# Patient Record
Sex: Female | Born: 1960 | Race: White | Hispanic: No | Marital: Married | Smoking: Former smoker
Health system: Southern US, Community
[De-identification: ages and names within clinical notes are randomized; demographics above are authoritative.]

## PROBLEM LIST (undated history)

## (undated) DIAGNOSIS — J189 Pneumonia, unspecified organism: Secondary | ICD-10-CM

## (undated) DIAGNOSIS — Z973 Presence of spectacles and contact lenses: Secondary | ICD-10-CM

## (undated) DIAGNOSIS — M5137 Other intervertebral disc degeneration, lumbosacral region: Secondary | ICD-10-CM

## (undated) DIAGNOSIS — I341 Nonrheumatic mitral (valve) prolapse: Secondary | ICD-10-CM

## (undated) DIAGNOSIS — N950 Postmenopausal bleeding: Secondary | ICD-10-CM

## (undated) DIAGNOSIS — C801 Malignant (primary) neoplasm, unspecified: Secondary | ICD-10-CM

## (undated) DIAGNOSIS — Z8679 Personal history of other diseases of the circulatory system: Secondary | ICD-10-CM

## (undated) DIAGNOSIS — Z87412 Personal history of vulvar dysplasia: Secondary | ICD-10-CM

## (undated) DIAGNOSIS — M419 Scoliosis, unspecified: Secondary | ICD-10-CM

## (undated) DIAGNOSIS — M1990 Unspecified osteoarthritis, unspecified site: Secondary | ICD-10-CM

## (undated) DIAGNOSIS — Z8739 Personal history of other diseases of the musculoskeletal system and connective tissue: Secondary | ICD-10-CM

## (undated) DIAGNOSIS — K219 Gastro-esophageal reflux disease without esophagitis: Secondary | ICD-10-CM

## (undated) DIAGNOSIS — M47816 Spondylosis without myelopathy or radiculopathy, lumbar region: Secondary | ICD-10-CM

## (undated) DIAGNOSIS — M5136 Other intervertebral disc degeneration, lumbar region: Secondary | ICD-10-CM

## (undated) HISTORY — DX: Gastro-esophageal reflux disease without esophagitis: K21.9

## (undated) HISTORY — PX: UPPER GASTROINTESTINAL ENDOSCOPY: SHX188

## (undated) HISTORY — PX: CARDIAC CATHETERIZATION: SHX172

## (undated) HISTORY — PX: TOTAL HIP ARTHROPLASTY: SHX124

## (undated) HISTORY — PX: COLONOSCOPY W/ POLYPECTOMY: SHX1380

## (undated) HISTORY — PX: VSD REPAIR: SHX276

## (undated) HISTORY — DX: Nonrheumatic mitral (valve) prolapse: I34.1

## (undated) HISTORY — PX: ANTERIOR CERVICAL DECOMP/DISCECTOMY FUSION: SHX1161

---
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
## 1981-09-07 HISTORY — PX: LEG SURGERY: SHX1003

## 2003-12-18 ENCOUNTER — Ambulatory Visit (HOSPITAL_COMMUNITY): Admission: RE | Admit: 2003-12-18 | Discharge: 2003-12-18 | Payer: Self-pay | Admitting: Internal Medicine

## 2004-01-15 ENCOUNTER — Encounter (HOSPITAL_COMMUNITY): Admission: RE | Admit: 2004-01-15 | Discharge: 2004-02-14 | Payer: Self-pay | Admitting: Orthopedic Surgery

## 2004-02-19 ENCOUNTER — Encounter (HOSPITAL_COMMUNITY): Admission: RE | Admit: 2004-02-19 | Discharge: 2004-03-20 | Payer: Self-pay | Admitting: Orthopedic Surgery

## 2005-03-24 ENCOUNTER — Ambulatory Visit (HOSPITAL_COMMUNITY): Admission: RE | Admit: 2005-03-24 | Discharge: 2005-03-24 | Payer: Self-pay | Admitting: Family Medicine

## 2005-04-20 ENCOUNTER — Ambulatory Visit (HOSPITAL_COMMUNITY): Admission: RE | Admit: 2005-04-20 | Discharge: 2005-04-20 | Payer: Self-pay | Admitting: *Deleted

## 2005-05-07 ENCOUNTER — Other Ambulatory Visit: Admission: RE | Admit: 2005-05-07 | Discharge: 2005-05-07 | Payer: Self-pay | Admitting: *Deleted

## 2005-12-21 ENCOUNTER — Encounter (HOSPITAL_COMMUNITY): Admission: RE | Admit: 2005-12-21 | Discharge: 2006-01-20 | Payer: Self-pay | Admitting: Internal Medicine

## 2006-01-19 ENCOUNTER — Ambulatory Visit: Payer: Self-pay | Admitting: Internal Medicine

## 2006-01-20 ENCOUNTER — Ambulatory Visit: Payer: Self-pay | Admitting: Internal Medicine

## 2006-01-20 ENCOUNTER — Encounter (INDEPENDENT_AMBULATORY_CARE_PROVIDER_SITE_OTHER): Payer: Self-pay | Admitting: Specialist

## 2006-01-21 ENCOUNTER — Ambulatory Visit: Payer: Self-pay | Admitting: Cardiology

## 2006-02-05 ENCOUNTER — Ambulatory Visit: Payer: Self-pay | Admitting: Internal Medicine

## 2006-04-07 HISTORY — PX: LAPAROSCOPIC CHOLECYSTECTOMY: SUR755

## 2006-04-19 ENCOUNTER — Ambulatory Visit (HOSPITAL_COMMUNITY): Admission: RE | Admit: 2006-04-19 | Discharge: 2006-04-20 | Payer: Self-pay | Admitting: Surgery

## 2006-04-19 ENCOUNTER — Encounter (INDEPENDENT_AMBULATORY_CARE_PROVIDER_SITE_OTHER): Payer: Self-pay | Admitting: Specialist

## 2007-09-08 HISTORY — PX: CHOLECYSTECTOMY: SHX55

## 2008-06-28 ENCOUNTER — Emergency Department (HOSPITAL_COMMUNITY): Admission: EM | Admit: 2008-06-28 | Discharge: 2008-06-28 | Payer: Self-pay | Admitting: Emergency Medicine

## 2008-07-26 ENCOUNTER — Ambulatory Visit (HOSPITAL_COMMUNITY): Admission: RE | Admit: 2008-07-26 | Discharge: 2008-07-26 | Payer: Self-pay | Admitting: Internal Medicine

## 2008-11-15 ENCOUNTER — Encounter: Admission: RE | Admit: 2008-11-15 | Discharge: 2008-11-15 | Payer: Self-pay | Admitting: Obstetrics and Gynecology

## 2009-09-07 HISTORY — PX: TOTAL HIP ARTHROPLASTY: SHX124

## 2009-09-07 HISTORY — PX: NECK SURGERY: SHX720

## 2009-10-31 ENCOUNTER — Ambulatory Visit (HOSPITAL_COMMUNITY): Admission: RE | Admit: 2009-10-31 | Discharge: 2009-10-31 | Payer: Self-pay | Admitting: Internal Medicine

## 2009-11-01 ENCOUNTER — Ambulatory Visit (HOSPITAL_COMMUNITY): Admission: RE | Admit: 2009-11-01 | Discharge: 2009-11-01 | Payer: Self-pay | Admitting: Internal Medicine

## 2009-11-21 ENCOUNTER — Ambulatory Visit (HOSPITAL_COMMUNITY): Admission: RE | Admit: 2009-11-21 | Discharge: 2009-11-21 | Payer: Self-pay | Admitting: Neurosurgery

## 2009-11-25 ENCOUNTER — Ambulatory Visit (HOSPITAL_COMMUNITY): Admission: RE | Admit: 2009-11-25 | Discharge: 2009-11-26 | Payer: Self-pay | Admitting: Neurosurgery

## 2009-12-19 ENCOUNTER — Encounter: Admission: RE | Admit: 2009-12-19 | Discharge: 2009-12-19 | Payer: Self-pay | Admitting: Neurosurgery

## 2010-01-23 ENCOUNTER — Emergency Department (HOSPITAL_COMMUNITY): Admission: EM | Admit: 2010-01-23 | Discharge: 2010-01-23 | Payer: Self-pay | Admitting: Emergency Medicine

## 2010-04-01 ENCOUNTER — Encounter: Admission: RE | Admit: 2010-04-01 | Discharge: 2010-04-01 | Payer: Self-pay | Admitting: Neurosurgery

## 2010-04-18 ENCOUNTER — Ambulatory Visit: Payer: Self-pay | Admitting: Vascular Surgery

## 2010-05-15 ENCOUNTER — Encounter: Admission: RE | Admit: 2010-05-15 | Discharge: 2010-05-15 | Payer: Self-pay | Admitting: Neurosurgery

## 2010-05-23 ENCOUNTER — Encounter: Admission: RE | Admit: 2010-05-23 | Discharge: 2010-05-23 | Payer: Self-pay | Admitting: Neurosurgery

## 2010-06-16 ENCOUNTER — Inpatient Hospital Stay (HOSPITAL_COMMUNITY): Admission: RE | Admit: 2010-06-16 | Discharge: 2010-06-19 | Payer: Self-pay | Admitting: Orthopedic Surgery

## 2010-09-28 ENCOUNTER — Encounter: Payer: Self-pay | Admitting: Neurosurgery

## 2010-11-20 LAB — CBC
HCT: 27.3 % — ABNORMAL LOW (ref 36.0–46.0)
Hemoglobin: 9.6 g/dL — ABNORMAL LOW (ref 12.0–15.0)
MCHC: 35.3 g/dL (ref 30.0–36.0)
MCHC: 35.4 g/dL (ref 30.0–36.0)
Platelets: 298 10*3/uL (ref 150–400)
RBC: 3.04 MIL/uL — ABNORMAL LOW (ref 3.87–5.11)
RDW: 13.3 % (ref 11.5–15.5)
WBC: 8.1 10*3/uL (ref 4.0–10.5)
WBC: 8.5 10*3/uL (ref 4.0–10.5)

## 2010-11-20 LAB — URINALYSIS, ROUTINE W REFLEX MICROSCOPIC
Glucose, UA: NEGATIVE mg/dL
Hgb urine dipstick: NEGATIVE
Ketones, ur: NEGATIVE mg/dL
Protein, ur: NEGATIVE mg/dL
Urobilinogen, UA: 0.2 mg/dL (ref 0.0–1.0)

## 2010-11-20 LAB — BASIC METABOLIC PANEL
BUN: 7 mg/dL (ref 6–23)
Calcium: 8.1 mg/dL — ABNORMAL LOW (ref 8.4–10.5)
Calcium: 8.3 mg/dL — ABNORMAL LOW (ref 8.4–10.5)
Creatinine, Ser: 0.59 mg/dL (ref 0.4–1.2)
GFR calc Af Amer: >60 mL/min (ref >60)
GFR calc non Af Amer: >60 mL/min (ref >60)
GFR calc non Af Amer: >60 mL/min (ref >60)
Glucose, Bld: 131 mg/dL — ABNORMAL HIGH (ref 70–99)
Glucose, Bld: 135 mg/dL — ABNORMAL HIGH (ref 70–99)
Potassium: 3.8 mEq/L (ref 3.5–5.1)
Potassium: 3.8 mEq/L (ref 3.5–5.1)
Sodium: 136 mEq/L (ref 135–145)

## 2010-11-20 LAB — TYPE AND SCREEN: Antibody Screen: NEGATIVE

## 2010-11-20 LAB — ABO/RH: ABO/RH(D): AB POS

## 2010-11-20 LAB — SURGICAL PCR SCREEN
MRSA, PCR: NEGATIVE
Staphylococcus aureus: NEGATIVE

## 2010-11-20 LAB — PROTIME-INR: Prothrombin Time: 13 seconds (ref 11.6–15.2)

## 2010-11-30 LAB — SURGICAL PCR SCREEN: MRSA, PCR: NEGATIVE

## 2010-11-30 LAB — BASIC METABOLIC PANEL
CO2: 29 mEq/L (ref 19–32)
Calcium: 9.7 mg/dL (ref 8.4–10.5)
GFR calc Af Amer: >60 mL/min (ref >60)
Potassium: 4.3 mEq/L (ref 3.5–5.1)
Sodium: 139 mEq/L (ref 135–145)

## 2010-11-30 LAB — CBC
HCT: 39.6 % (ref 36.0–46.0)
Hemoglobin: 13.9 g/dL (ref 12.0–15.0)
MCHC: 35 g/dL (ref 30.0–36.0)
RBC: 4.46 MIL/uL (ref 3.87–5.11)

## 2010-12-05 ENCOUNTER — Other Ambulatory Visit (HOSPITAL_COMMUNITY): Payer: Self-pay | Admitting: Orthopedic Surgery

## 2010-12-05 DIAGNOSIS — M7989 Other specified soft tissue disorders: Secondary | ICD-10-CM

## 2010-12-05 DIAGNOSIS — M79604 Pain in right leg: Secondary | ICD-10-CM

## 2010-12-05 DIAGNOSIS — M79605 Pain in left leg: Secondary | ICD-10-CM

## 2010-12-08 ENCOUNTER — Other Ambulatory Visit (HOSPITAL_COMMUNITY): Payer: Self-pay

## 2010-12-17 ENCOUNTER — Other Ambulatory Visit: Payer: Self-pay | Admitting: Obstetrics and Gynecology

## 2011-01-20 NOTE — Consult Note (Signed)
NEW PATIENT CONSULTATION   [name], [name]  DOB:  Oct 11, 1960                                       04/18/2010  [id number]#:[id number]   The patient presents today for evaluation of right leg pain and with a  history of prior right femoral artery repair.  She is a very pleasant,  50 year old white female with a prior history of right femoral artery  repair after a cath lab injury in 1983.  She had a history of what  sounds like congenital heart disease and had a right groin injury from a  cardiac cath in 1983.  She had repair of this by Dr. Hendricks [name];  apparently had a Dacron patch or Dacron replacement.  However, over the  past several months, she has been having pain in her right leg.  Initially, she was concerned that this may be on an ischemic standpoint.  But, with further evaluation, this has been determined to be related to  degenerative disk disease in her back.  She reports that the pain can  occasionally occur with sitting but is more profound with lying flat and  is positional.  She does report some pain extending down into her leg  but this is not a exercise-induced, claudication-type pain.  She has no  history of tissue loss in her lower extremities.   PRIOR SURGERIES:  She has had prior spine, lumbar, and cervical disk.  She has had a cholecystectomy in the past.   SOCIAL HISTORY:  She is married.  She works in Chief Financial Officer.  She does not  smoke having quit in 2004.  Does not drink alcohol on a regular basis.   FAMILY HISTORY:  Negative for atherosclerotic disease.   REVIEW OF SYSTEMS:  Positive for weight gain up to 217 pounds.  She is 5  feet 2 inches tall.  CARDIAC:  Positive for palpitations.  GI:  Reflux.  NEUROLOGIC:  Headaches.  PULMONARY:  Negative.  UROLOGIC:  Negative.  ENT:  Negative.  MUSCULOSKELETAL:  Positive for arthritis, joint pain, muscle pain.  PSYCHIATRIC:  Negative.  SKIN:  Negative.   PHYSICAL EXAM:  Well-developed white  female appearing stated age.  Blood  pressure is 112/76, pulse 82, respirations 18.  HEENT is normal.  Her  chest is clear bilaterally.  Heart:  Regular rate and rhythm.  Abdomen:  Moderate obesity and nontender.  Musculoskeletal shows no major  deformity or cyanosis.  Neurologic:  No focal weakness or paresthesias.  Skin without ulcers or rashes.  She does have palpable femoral and 1 to  2+ dorsalis pedis pulses bilaterally.  Her right groin incision is well  healed.   She does not have any evidence of false aneurysm or other pathology in  her groin.  She did undergo noninvasive vascular laboratories in our  office which reveal normal ankle-arm indices bilaterally at 0.99 on the  right and a greater than 1.0 on the left.  I discussed this at length  with the patient.  I do not feel that her leg pain is related to  arterial insufficiency.  I have recommended that she continue her plans  regarding her orthopedic spine concerns since it does not appear to be  related to arterial insufficiency.  She will follow up with [place] again on  an as-needed basis.     Larina Earthly,  M.D.  Electronically Signed   TFE/MEDQ  D:  04/18/2010  T:  04/21/2010  Job:  [id number]   cc:   Dr. Darnell [name], [place], [place]  [name], M.D.  Madelin Rear. Rachel Gambler, MD

## 2011-01-23 NOTE — Op Note (Signed)
[name], ABEND NO.:  [id number]   MEDICAL RECORD NO.:  [id number]          PATIENT TYPE:  AMB   LOCATION:  SDS                          FACILITY:  [facility]   PHYSICIAN:  Velora Heckler, MD      DATE OF BIRTH:  [birth year]   DATE OF PROCEDURE:  04/19/2006  DATE OF DISCHARGE:                                 OPERATIVE REPORT   PREOPERATIVE DIAGNOSIS:  Biliary dyskinesia, abdominal pain.   POSTOPERATIVE DIAGNOSIS:  Biliary dyskinesia, abdominal pain.   PROCEDURE:  Laparoscopic cholecystectomy with intraoperative  cholangiography.   SURGEON:  Velora Heckler, MD, FACS   ASSISTANT:  Consuello Bossier, MD, FACS   ANESTHESIA:  General per Dr. Diamantina Monks.   ESTIMATED BLOOD LOSS:  Minimal.   PREPARATION:  Betadine.   COMPLICATIONS:  None.   INDICATIONS:  The patient is a 50 year old white female from [place],  [place].  She developed right upper quadrant abdominal pain radiating  to the back.  The patient had no workup by her primary care physician.  This  included nuclear medicine hepatobiliary scanning which showed an abnormally  low ejection fraction of the gallbladder.  The patient also underwent  evaluation by Dr. Yancey Flemings at the [facility] gastroenterology.  She now comes  to surgery for cholecystectomy for management of abdominal pain and biliary  dyskinesia.   BODY OF REPORT:  The procedure was done in OR #16 at [facility].  The patient was brought to the operating room, placed in supine  position on the operating room table.  Following administration of general  anesthesia, the patient is prepped and draped in the usual strict aseptic  fashion.  After ascertaining that an adequate level of anesthesia had been  obtained, an infraumbilical incision was made with a #15 blade.  Dissection  was carried down through subcutaneous tissues.  Fascia was incised in the  midline.  The peritoneal cavity is entered cautiously.  A 0  Vicryl  pursestring suture was placed in the fascia.  A Hasson cannula was  introduced and the abdomen insufflated with carbon dioxide.  Laparoscope was  introduced.  There are some adhesions to the upper midline of the abdominal  wall.  No clear reason for this was identified.  There is no sign of  inflammation.  Right upper quadrant is visualized with manipulation of the  scope.  Operative ports were placed along the right costal margin, midline,  midclavicular line, anterior axillary line.  Fundus of the gallbladder was  grasped and retracted cephalad.  There are some adhesions between the  duodenum and the gallbladder.  These were taken down with gentle blunt  dissection.  Hemostasis obtained with electrocautery.  Dissection was begun  at the neck of the gallbladder.  Peritoneum was incised.  Cystic artery was  dissected out along its length, doubly clipped, and divided.  Cystic duct  was then dissected out.  Clip was placed at the neck of the gallbladder.  Cystic duct is incised.  Cystic duct is quite small measuring 1-2 mm in  diameter.  [name]  cholangiography catheter was introduced into the cystic duct  and secured with a ligature clip.  Using C-arm fluoroscopy, real time  cholangiography was performed.  There is rapid filling of a normal common  bile duct.  There is free flow distally into the duodenum.  There is reflux  of contrast into the right and left hepatic ductal systems.  There are no  filling defects and no sign of obstruction.  Clip was withdrawn and the [name]  catheter was removed from the peritoneal cavity.  Cystic duct is triply  clipped and divided.  Posterior branches of the cystic artery are divided  between Ligaclips.  Gallbladder was then excised from the gallbladder bed  using the hook electrocautery for hemostasis.  Gallbladder was completely  excised.  It was withdrawn through the umbilical port without difficulty.  It is submitted to pathology for review.  0  Vicryl pursestring sutures tied  securely.  Right upper quadrant was irrigated with warm saline which was  evacuated.  Good hemostasis was noted.  Ports were removed under direct  vision.  Good hemostasis was noted at all port sites.  Pneumoperitoneum was  released.  Port sites were anesthetized with local anesthetic.  Wounds were  closed with interrupted 4-0 Vicryl subcuticular sutures.  Wounds washed and  dried and Benzoin and Steri-Strips were applied.  Sterile dressings were  applied.  The patient is awakened from anesthesia and brought to the  recovery room in stable condition.  The patient tolerated the procedure  well.      Velora Heckler, MD  Electronically Signed     TMG/MEDQ  D:  04/19/2006  T:  04/20/2006  Job:  [id number]   cc:   Tama Headings. Marina Goodell, M.D.  Madelin Rear. Sherwood Gambler, MD

## 2011-02-20 IMAGING — RF DG MYELOGRAM LUMBAR
15 series · 15 of 15 positions shown · IV contrast (omnipaque)
Comparison: none

CLINICAL DATA: Back pain

MYELOGRAM LUMBAR
TECHNIQUE: The procedure, risks, benefits, and alternatives were
explained to the patient. The patient understands and consents.
Under fluoroscopic guidance, a 22 gauge spinal needle was placed in
the CSF space via left L2-3 approach. 20 mL of Omnipaque 180 was
injected.

[Series 1: (hospital) · 1 of 1 slices shown]
[im 1/1]
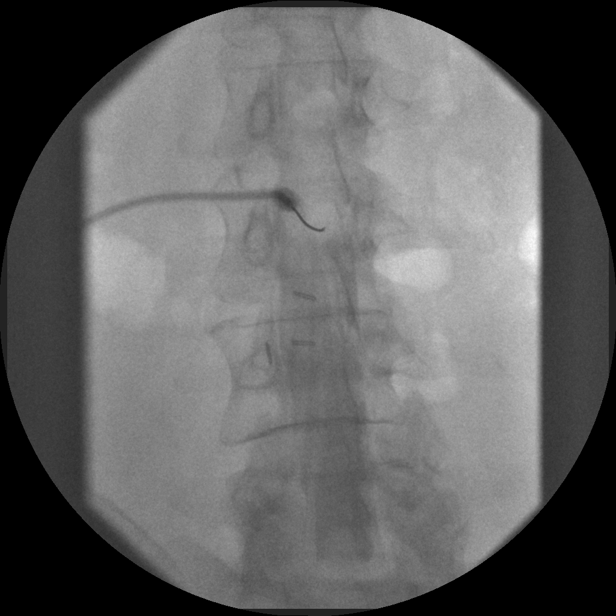

[Series 2: myelogram  white · 1 of 1 slices shown (1 of 14)]
[im 1/1]
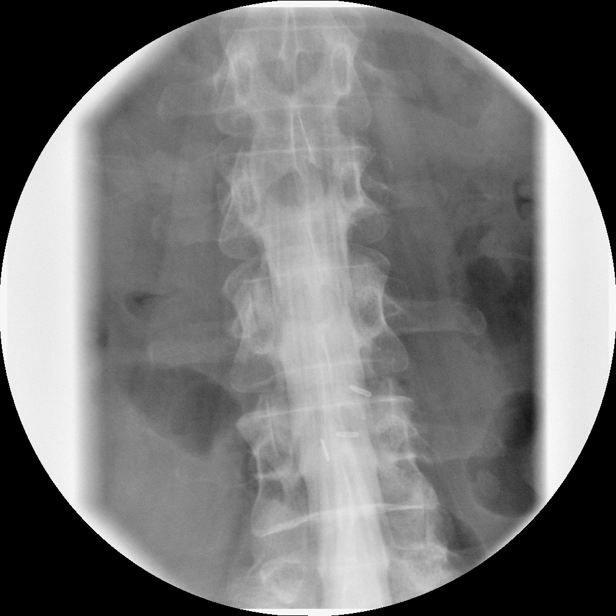

[Series 3: myelogram  white · 1 of 1 slices shown (2 of 14)]
[im 1/1]
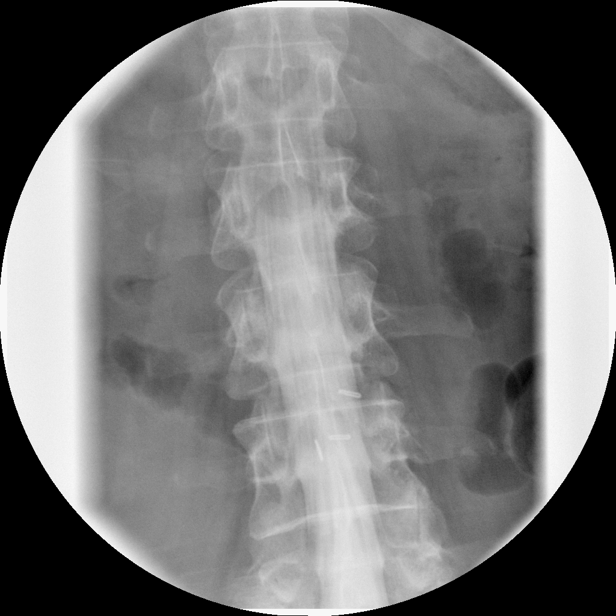

[Series 4: myelogram  white · 1 of 1 slices shown (3 of 14)]
[im 1/1]
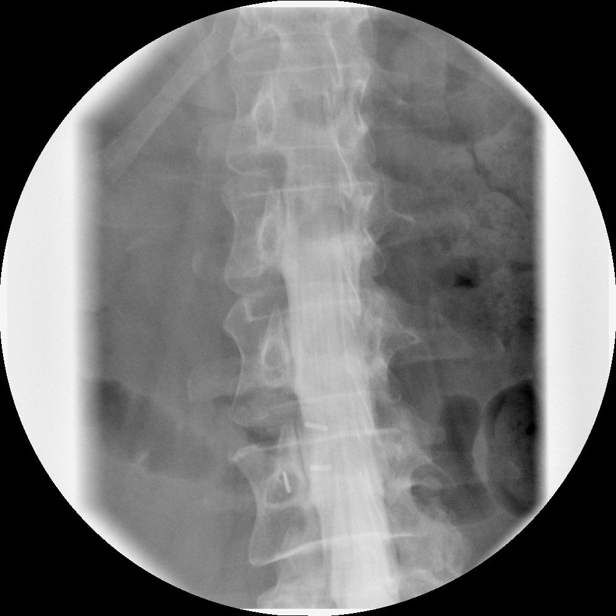

[Series 5: myelogram  white · 1 of 1 slices shown (4 of 14)]
[im 1/1]
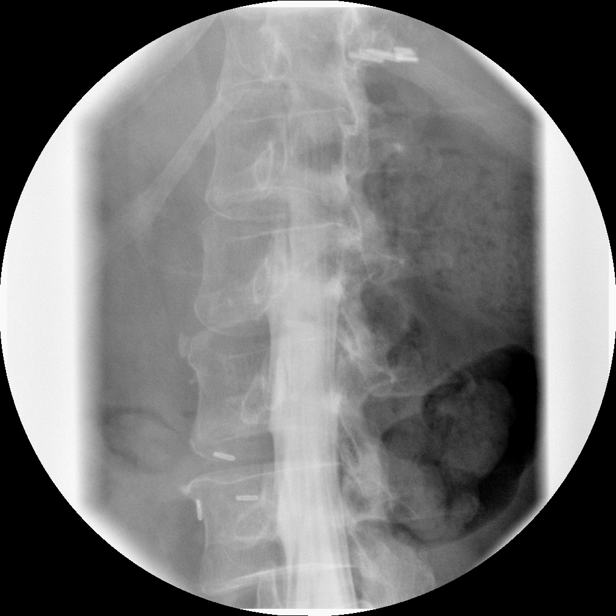

[Series 6: myelogram  white · 1 of 1 slices shown (5 of 14)]
[im 1/1]
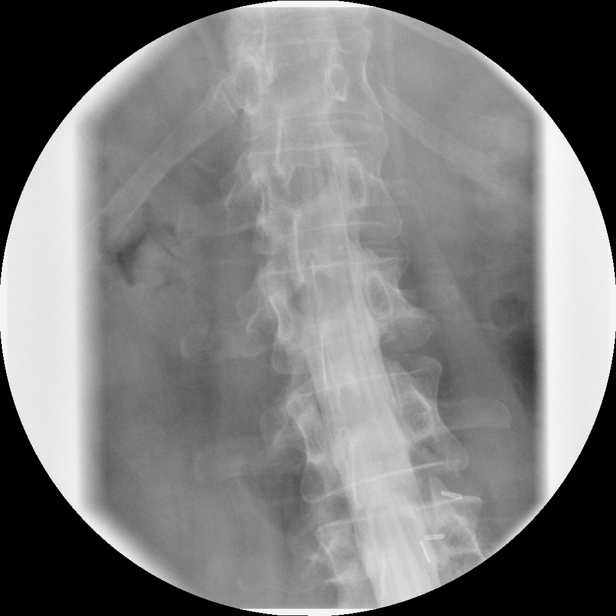

[Series 7: myelogram  white · 1 of 1 slices shown (6 of 14)]
[im 1/1]
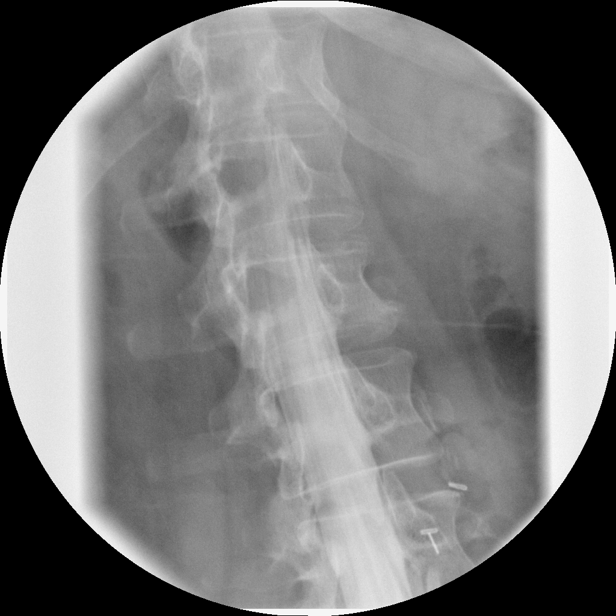

[Series 8: myelogram  white · 1 of 1 slices shown (7 of 14)]
[im 1/1]
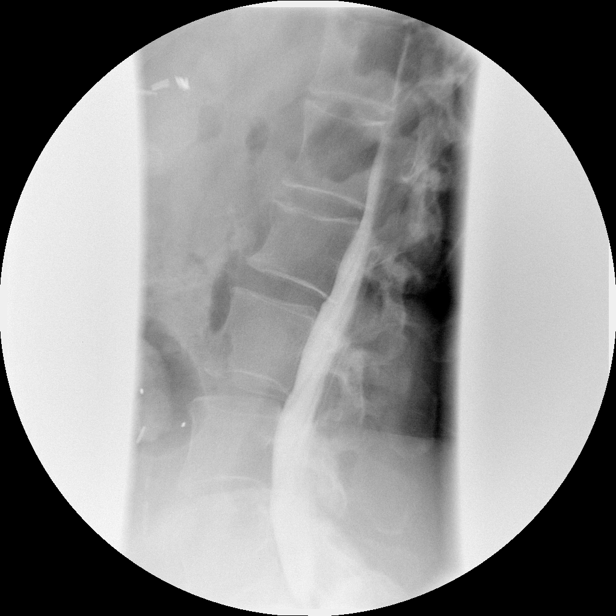

[Series 9: myelogram  white · 1 of 1 slices shown (8 of 14)]
[im 1/1]
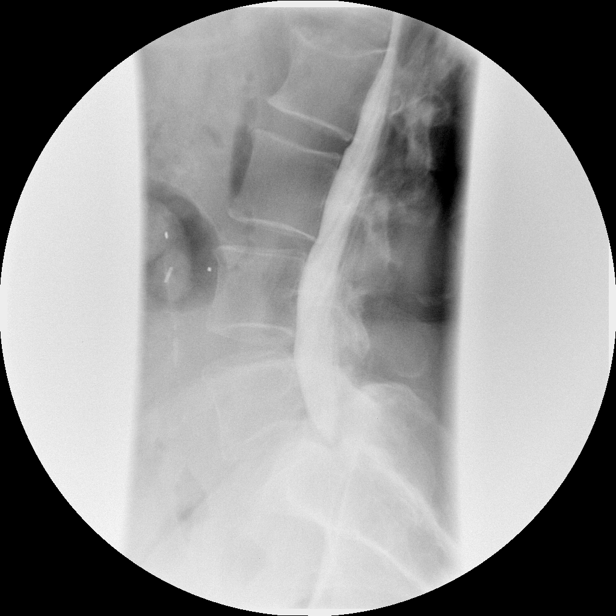

[Series 11: myelogram  white · 1 of 1 slices shown (9 of 14)]
[im 1/1]
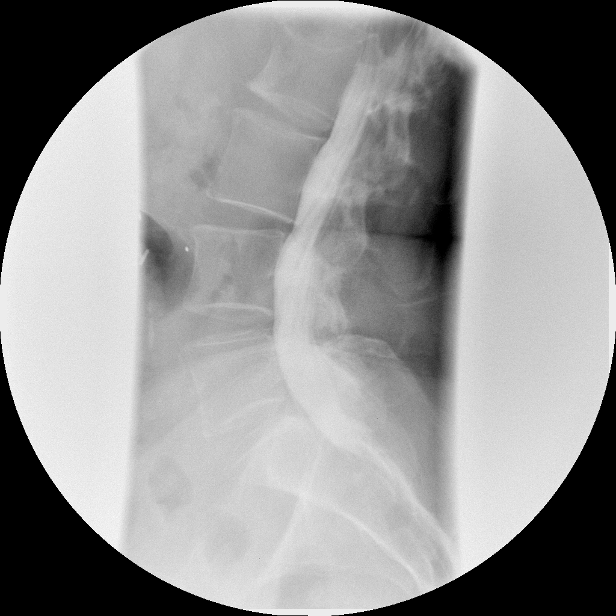

[Series 12: myelogram  white · 1 of 1 slices shown (10 of 14)]
[im 1/1]
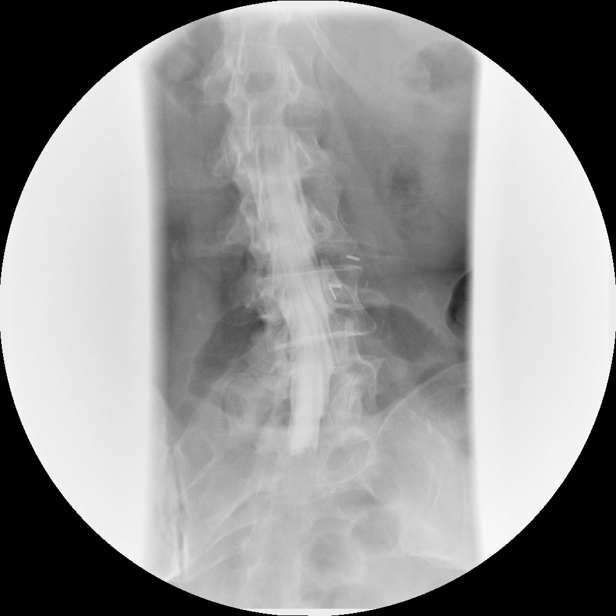

[Series 13: myelogram  white · 1 of 1 slices shown (11 of 14)]
[im 1/1]
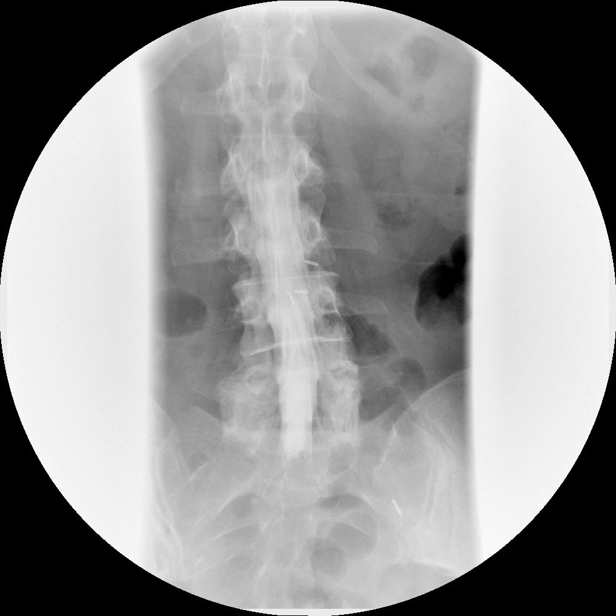

[Series 14: myelogram  white · 1 of 1 slices shown (12 of 14)]
[im 1/1]
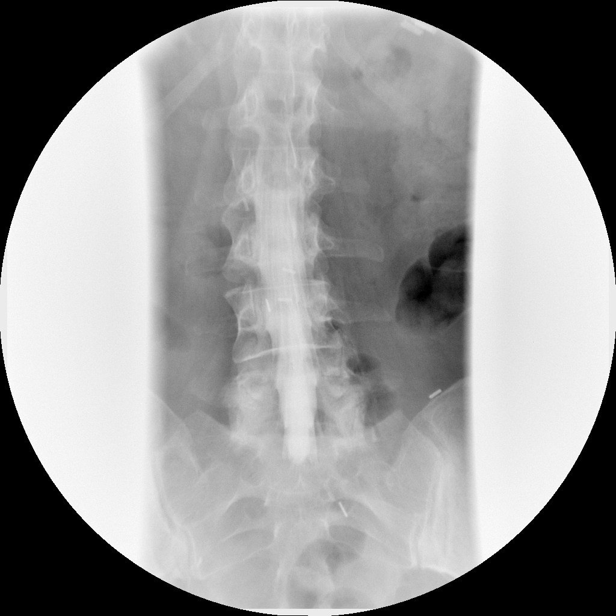

[Series 15: myelogram  white · 1 of 1 slices shown (13 of 14)]
[im 1/1]
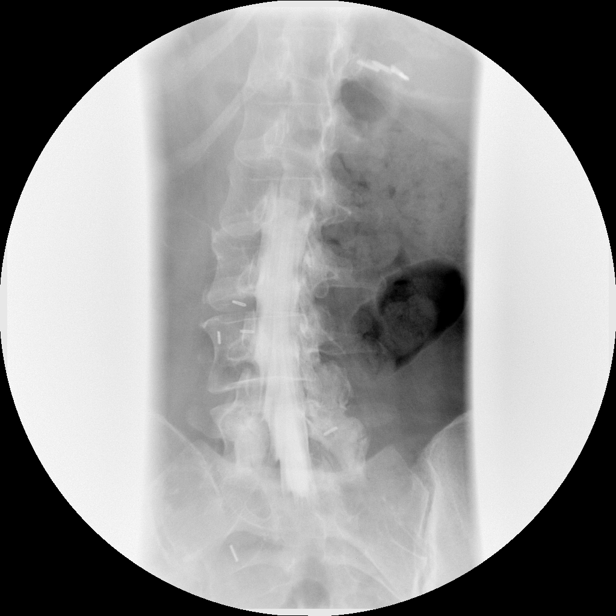

[Series 16: myelogram  white · 1 of 1 slices shown (14 of 14)]
[im 1/1]
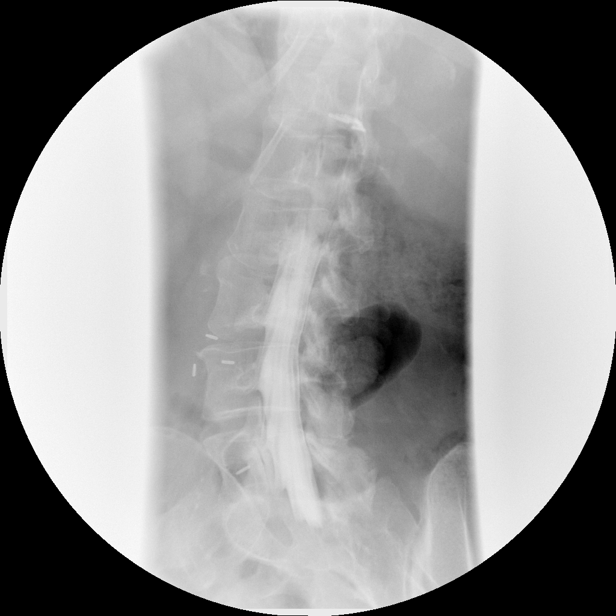

[15 of 15 positions shown; findings below may reference images not displayed]

FINDINGS: No new vertebral body height loss. Stable mild
compression deformity at L2.  Mild dextroscoliosis at the apex at
L4-5.  Anatomic alignment on the lateral view.  No pars defect.
Central canal is widely patent.  No evidence of instability on
flexion or extension views.

Complications: None

Fluoroscopy Time: 1 minute and 13 seconds.
IMPRESSION: No evidence of stenosis or impingement.  Stable.

## 2011-03-20 IMAGING — CR DG CHEST 2V
2 series · 2 of 2 positions shown · non-contrast
Comparison: 06/28/2008, 04/14/2006

CLINICAL DATA: Avascular necrosis of right hip.  Preop.  Ex-smoker.

CHEST - 2 VIEW

[w chest pa]
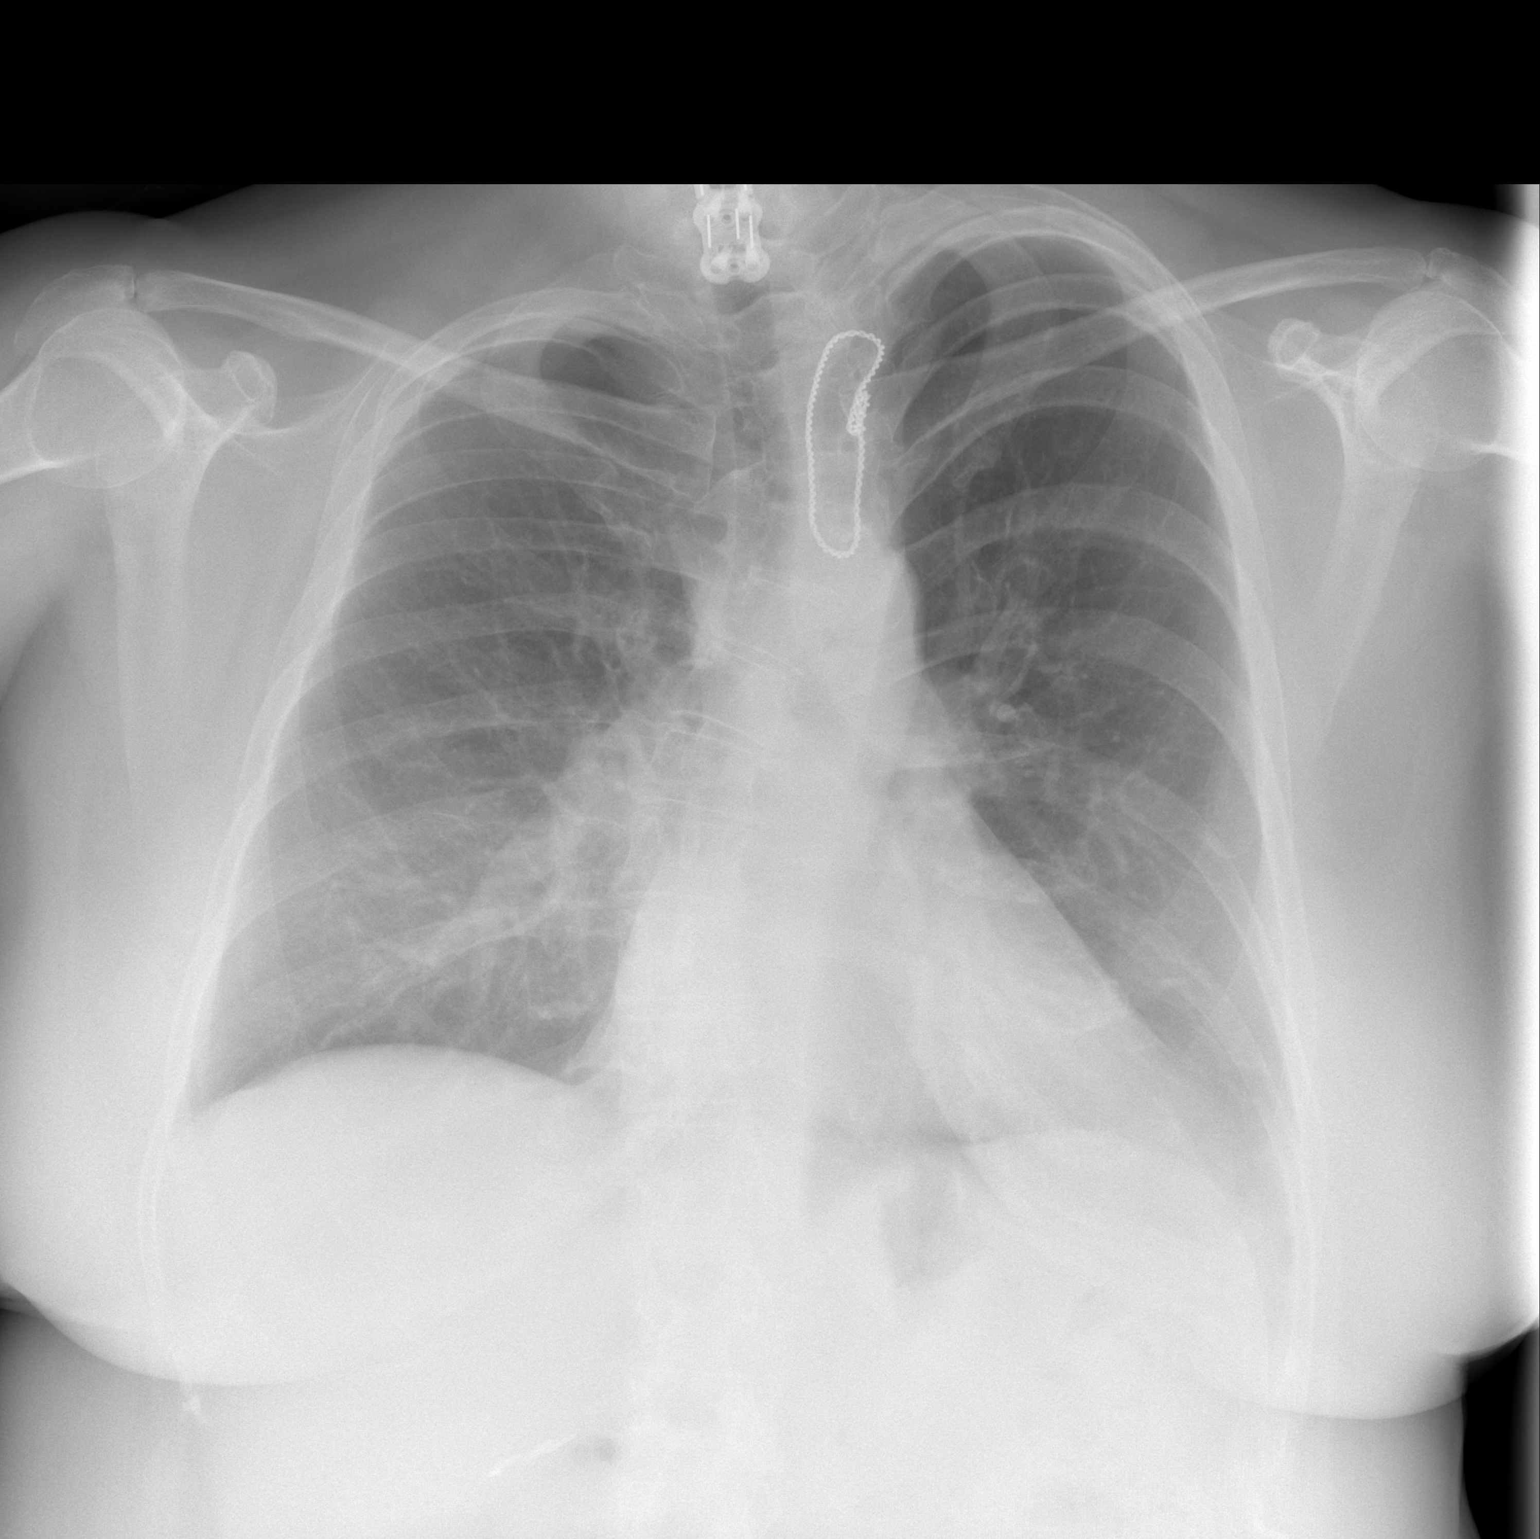

[w chest lat]
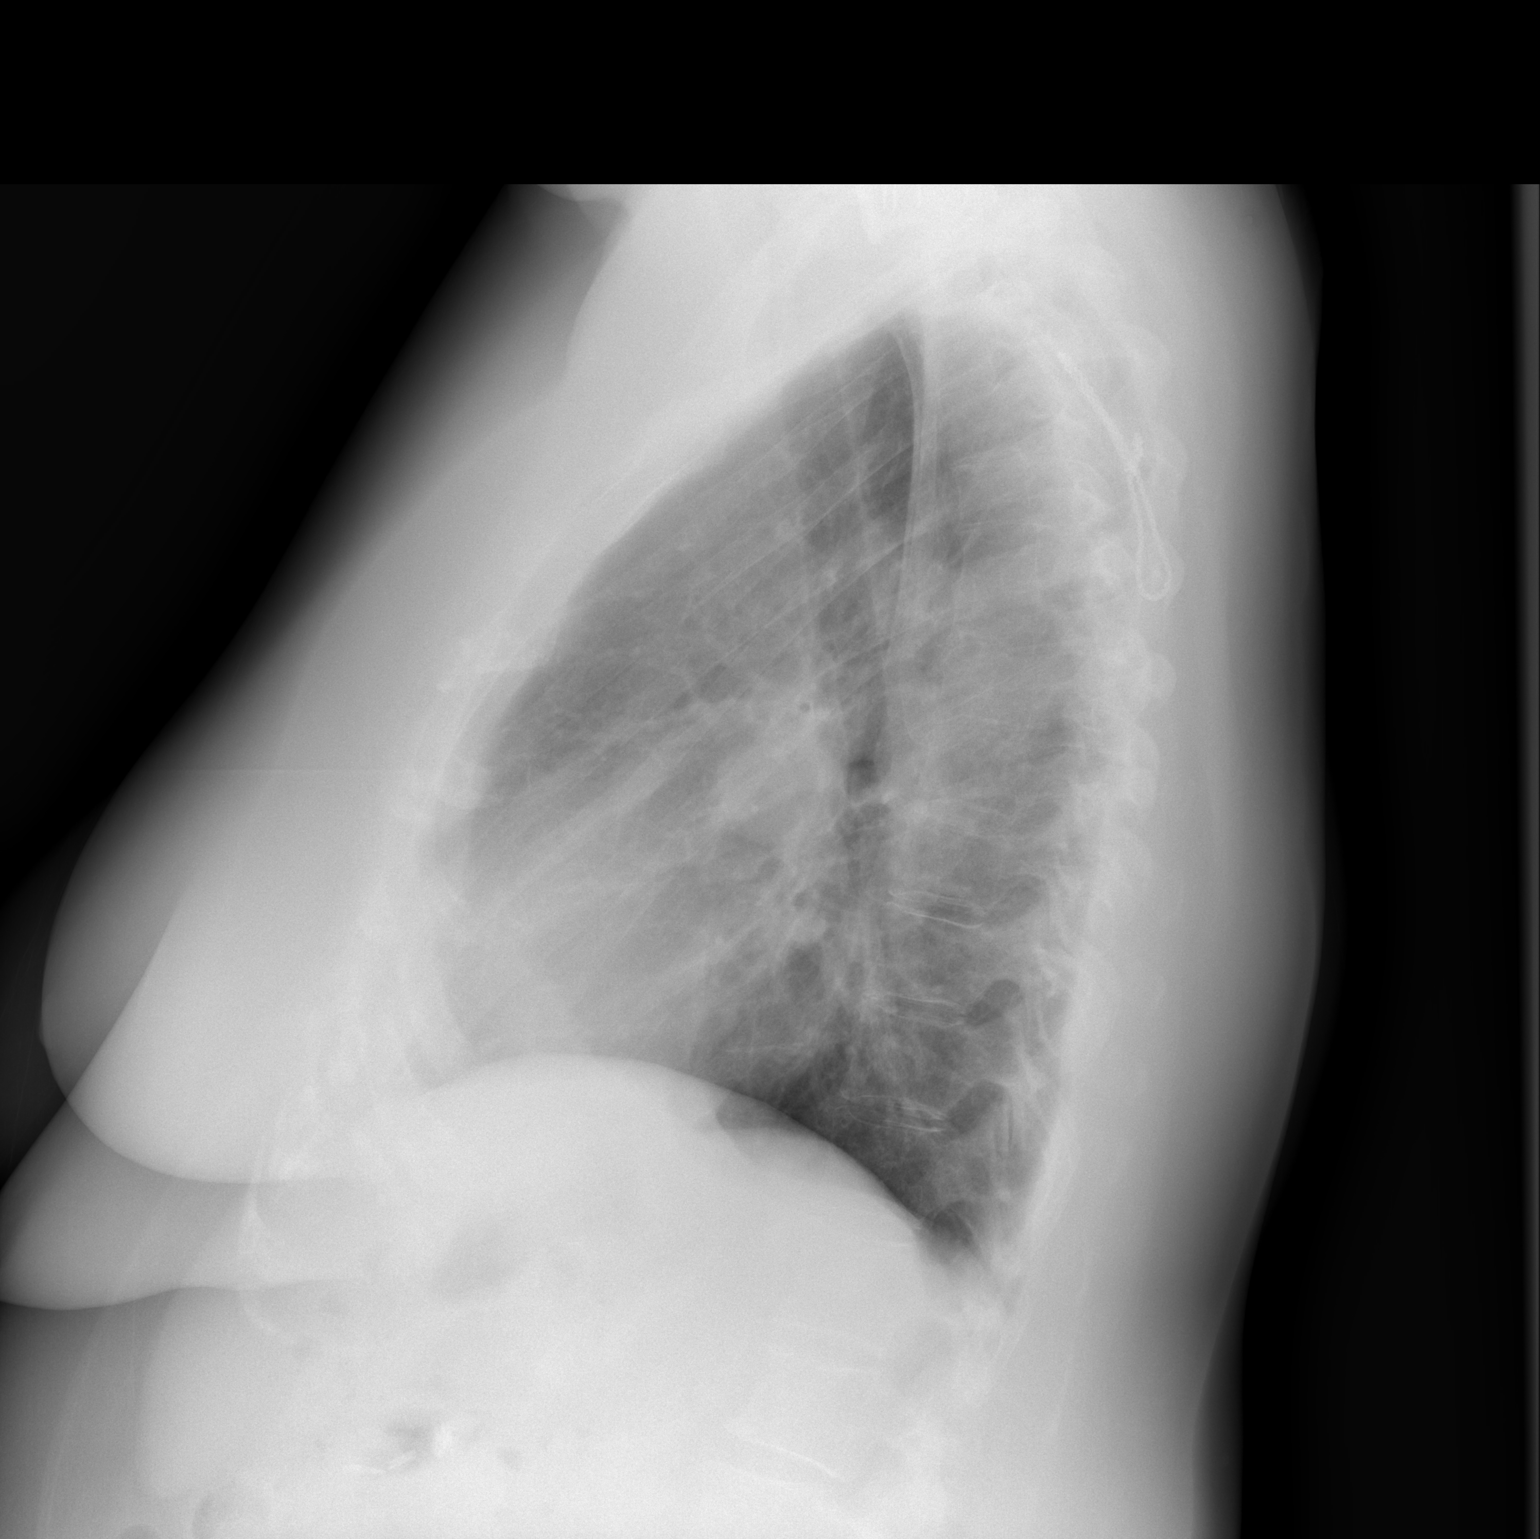

[2 of 2 positions shown; findings below may reference images not displayed]

FINDINGS: Moderate S-shaped thoracolumbar spine curvature.  Lower
cervical spine fixation. Midline trachea.  Normal heart size.
Stable apparent prominence of the right hilum, felt to be secondary
to spinal curvature and resultant distortion. No pleural effusion
or pneumothorax.  Clear lungs.

Surgical changes about the upper thoracic spine.
IMPRESSION: No acute cardiopulmonary disease.

## 2011-06-03 ENCOUNTER — Other Ambulatory Visit: Payer: Self-pay | Admitting: Neurosurgery

## 2011-06-03 DIAGNOSIS — M542 Cervicalgia: Secondary | ICD-10-CM

## 2011-06-17 ENCOUNTER — Ambulatory Visit
Admission: RE | Admit: 2011-06-17 | Discharge: 2011-06-17 | Disposition: A | Discharge status: Home / Self Care | Payer: Self-pay | Source: Ambulatory Visit | Attending: Neurosurgery | Admitting: Neurosurgery

## 2011-06-17 DIAGNOSIS — M542 Cervicalgia: Secondary | ICD-10-CM

## 2011-07-01 ENCOUNTER — Ambulatory Visit (HOSPITAL_COMMUNITY)
Admission: RE | Admit: 2011-07-01 | Discharge: 2011-07-01 | Disposition: A | Discharge status: Home / Self Care | Payer: PRIVATE HEALTH INSURANCE | Source: Ambulatory Visit | Attending: Neurosurgery | Admitting: Neurosurgery

## 2011-07-01 DIAGNOSIS — IMO0001 Reserved for inherently not codable concepts without codable children: Secondary | ICD-10-CM | POA: Insufficient documentation

## 2011-07-01 DIAGNOSIS — M6281 Muscle weakness (generalized): Secondary | ICD-10-CM | POA: Insufficient documentation

## 2011-07-01 DIAGNOSIS — M542 Cervicalgia: Secondary | ICD-10-CM | POA: Insufficient documentation

## 2011-07-01 NOTE — Progress Notes (Signed)
Physical Therapy Evaluation  Patient Details  Name: [name] MRN: [id number] Date of Birth: 1960/11/28  Today's Date: 07/01/2011 Time: 4098-1191 Time Calculation (min): 68 min Visit#: 1  of 8   Re-eval: 07/31/11 Assessment Diagnosis: cervical pain Prior Therapy: none Charge:  Evaluation; massage Past Medical History: No past medical history on file. Past Surgical History: No past surgical history on file.  Subjective Symptoms/Limitations Symptoms: [name] states that she had c4-c6 fused about a year ago.  She was doing well up until a month ago when she began to have increasing neck pain.  She states she had an MRI which shows that C2-3 is now bulging as well.  She  states that by the end of the day she feel like ther is a a weight on her head and she is having H/A on a nightly basis.  The patient is currently able to do her normal activities she is just having increased pain while doing them.  She has been referred to physcial therapy to try and decrease her symptoms of pain.  PMH includes R hip replacement which has left her right leg longer and scoliosis. How long can you sit comfortably?: Pt states no position increases or decreases her neck pain she just has progressive pain as the day goes on. Repetition: Increases Symptoms Special Tests: Pt has marked mm spasm B cervical and mid trap area.  Scar from fusion is adherent. Pain Assessment Currently in Pain?: Yes Pain Score:   4 (worst in the past week would be an 8 while the least is at a) Pain Location: Neck Pain Type: Acute pain Pain Onset: 1 to 4 weeks ago Pain Frequency: Constant (varies in intensity.) Pain Relieving Factors: none Effect of Pain on Daily Activities: increases    Objective Cervical AROM Cervical Flexion: decreased 20% Cervical Extension: decrased 20% Cervical - Right Side Bend: decreased 20% Cervical - Left Side Bend: decreased 20% Cervical - Right Rotation: decreased 20% Cervical - Left  Rotation: decreased 30% Cervical Strength Cervical Flexion: 3/5 Cervical Extension: 3+/5 Cervical - Right Side Bend: 3/5 Cervical - Left Side Bend: 3+/5 Posture:  Pt exhibits forward head and increased kyphosis Exercise/Treatments   Neck Exercises  shoulder shrugs. Cervical isometrics for SB/Ext x 5 Scapular Retraction: 10 reps   Manual Therapy: massage to upper cervical through mid trap B with marked mm spasms noted  Physical Therapy Assessment and Plan PT Assessment and Plan Clinical Impression Statement: Pt with posterior derangement who will benefit from education, strengthening and modalities to decrease spasms and sx of pain. Rehab Potential: Good Clinical Impairments Affecting Rehab Potential: decreased strength, pain, PT Frequency: Min 2X/week PT Duration: 4 weeks PT Treatment/Interventions: Therapeutic exercise;Patient/family education (massage) PT Plan: Pt to be seen 2x/wk to improve pain and headaches.  Next treatment at UBE backward, T-band ex for posture, c-retraction and wall push up exercises.    Goals Home Exercise Program Pt will Perform Home Exercise Program: Independently PT Short Term Goals Time to Complete Short Term Goals: 2 weeks PT Short Term Goal 1: Patient pain to be no greater than a 5/10 PT Short Term Goal 2: Patient to state H/A are only 4 x/wk PT Short Term Goal 3: mm spasm decreased to moderate. PT Long Term Goals Time to Complete Long Term Goals: 4 weeks PT Long Term Goal 1: I in advance HEP PT Long Term Goal 2: Pain level to be no greater than a 3 Long Term Goal 3: Pt to no longer have  nightly headaches Long Term Goal 4: mm spasms mild  PT Long Term Goal 5: Pt to be able to verbalize the importance of good body mechanics and posture on back health  Problem List There is no problem list on file for this patient.   PT - End of Session Activity Tolerance: Patient tolerated treatment well General Behavior During Session: [facility] for tasks  performed Cognition: [facility] for tasks performed   RUSSELL,CINDY 07/01/2011, 12:34 PM  Physician Documentation Your signature is required to indicate approval of the treatment plan as stated above.  Please sign and either send electronically or make a copy of this report for your files and return this physician signed original.   Please mark one 1.__approve of plan  2. ___approve of plan with the following conditions.   ______________________________                                                          _____________________ Physician Signature                                                                                                             Date

## 2011-07-01 NOTE — Patient Instructions (Addendum)
HEP

## 2011-07-07 ENCOUNTER — Ambulatory Visit (HOSPITAL_COMMUNITY)
Admission: RE | Admit: 2011-07-07 | Discharge: 2011-07-07 | Disposition: A | Discharge status: Home / Self Care | Payer: PRIVATE HEALTH INSURANCE | Source: Ambulatory Visit | Attending: Neurosurgery | Admitting: Neurosurgery

## 2011-07-07 NOTE — Progress Notes (Signed)
Physical Therapy Treatment Patient Details  Name: [name] MRN: [id number] Date of Birth: 01-Oct-1960  Today's Date: 07/07/2011 Time: 4098-1191 Time Calculation (min): 47 min Visit#: 2  of 8   Re-eval: 07/31/11  Charge: therex 28 min Manual STM 12 min  Subjective: Symptoms/Limitations Symptoms: Pt brought in order from orthopedic doctor for hip stairn vs bursitis.  Session today was for cervical pain.  Pt stated no real pain just soreness on shoulders. Pain Assessment Currently in Pain?: No/denies  Objective:  Exercise/Treatments Stretches Upper Trapezius Stretch: 3 reps;30 seconds;Limitations Upper Trapezius Stretch Limitations: B Levator Stretch: 3 reps;30 seconds;Limitations Levator Stretch Limitations: B Shoulder Rolls: 10 reps Neck Exercises Neck Retraction: 10 reps;Seated;Limitations Neck Retraction Limitations: 5" holds Neck Extension: AROM;10 reps;Seated Neck Extension Limitations: isometric 5" holds Neck Lateral Flexion - Right: AROM;10 reps;Seated;Limitations Neck Lateral Flexion - Right Limitations: isometric 5" holds Neck Lateral Flexion - Left: AROM;10 reps;Seated;Limitations Neck Lateral Flexion - Left Limitations: isometric 5" holds Shoulder Extension: 10 reps;Standing;Theraband Theraband Level (Shoulder Extension): Level 3 (Green) Row: 10 reps;Standing;Theraband Theraband Level (Row): Level 3 (Green) Scapular Retraction: 10 reps;Standing;Theraband Theraband Level (Scapular Retraction): Level 3 (Green) Additional Neck Exercises UBE (Upper Arm Bike): 4' @ 1.0  Manual Therapy Myofascial Release: massage to upper and mid traps B with marked mm spasms noted x 12 min  Physical Therapy Assessment and Plan PT Assessment and Plan Clinical Impression Statement: Pt demonstrated all therex correctly with min cueing for positioning, able to perform all therex without difficulty.  Multiple spasms B upper and mid traps noted with STM.  Pt given upper trap and  levator stretches worksheet to add to her HEP. PT Plan: Continue with current POC, begin x to v for posture next session.    Goals    Problem List There is no problem list on file for this patient.   PT - End of Session Activity Tolerance: Patient tolerated treatment well General Behavior During Session: [facility] for tasks performed Cognition: [facility] for tasks performed  Juel Burrow 07/07/2011, 3:27 PM

## 2011-07-09 ENCOUNTER — Inpatient Hospital Stay (HOSPITAL_COMMUNITY): Admission: RE | Admit: 2011-07-09 | Payer: PRIVATE HEALTH INSURANCE | Source: Ambulatory Visit

## 2011-07-09 ENCOUNTER — Ambulatory Visit (HOSPITAL_COMMUNITY): Payer: PRIVATE HEALTH INSURANCE | Admitting: Physical Therapy

## 2012-02-19 ENCOUNTER — Other Ambulatory Visit (HOSPITAL_COMMUNITY): Payer: Self-pay | Admitting: Internal Medicine

## 2012-02-19 DIAGNOSIS — Z139 Encounter for screening, unspecified: Secondary | ICD-10-CM

## 2012-02-19 DIAGNOSIS — Z0000 Encounter for general adult medical examination without abnormal findings: Secondary | ICD-10-CM

## 2012-02-22 ENCOUNTER — Inpatient Hospital Stay (HOSPITAL_COMMUNITY): Admission: RE | Admit: 2012-02-22 | Payer: PRIVATE HEALTH INSURANCE | Source: Ambulatory Visit

## 2012-02-25 ENCOUNTER — Ambulatory Visit (HOSPITAL_COMMUNITY): Payer: PRIVATE HEALTH INSURANCE

## 2012-02-29 ENCOUNTER — Ambulatory Visit (HOSPITAL_COMMUNITY)
Admission: RE | Admit: 2012-02-29 | Discharge: 2012-02-29 | Disposition: A | Discharge status: Home / Self Care | Payer: PRIVATE HEALTH INSURANCE | Source: Ambulatory Visit | Attending: Internal Medicine | Admitting: Internal Medicine

## 2012-02-29 DIAGNOSIS — Z0000 Encounter for general adult medical examination without abnormal findings: Secondary | ICD-10-CM

## 2012-02-29 DIAGNOSIS — Z139 Encounter for screening, unspecified: Secondary | ICD-10-CM

## 2012-02-29 DIAGNOSIS — Z1231 Encounter for screening mammogram for malignant neoplasm of breast: Secondary | ICD-10-CM | POA: Insufficient documentation

## 2012-04-07 ENCOUNTER — Encounter: Payer: Self-pay | Admitting: Internal Medicine

## 2012-04-29 ENCOUNTER — Encounter: Payer: Self-pay | Admitting: Internal Medicine

## 2012-05-04 ENCOUNTER — Telehealth: Payer: Self-pay | Admitting: Internal Medicine

## 2012-05-04 NOTE — Telephone Encounter (Signed)
Pt scheduled to see Amy Esterwood PA 05/10/12@9am . Pt aware of appt date and time.

## 2012-05-10 ENCOUNTER — Ambulatory Visit (INDEPENDENT_AMBULATORY_CARE_PROVIDER_SITE_OTHER): Payer: PRIVATE HEALTH INSURANCE | Admitting: Physician Assistant

## 2012-05-10 ENCOUNTER — Encounter: Payer: Self-pay | Admitting: Internal Medicine

## 2012-05-10 ENCOUNTER — Encounter: Payer: Self-pay | Admitting: Physician Assistant

## 2012-05-10 VITALS — BP 130/80 | HR 80 | Ht 62.0 in | Wt 228.8 lb

## 2012-05-10 DIAGNOSIS — Z9089 Acquired absence of other organs: Secondary | ICD-10-CM

## 2012-05-10 DIAGNOSIS — M1990 Unspecified osteoarthritis, unspecified site: Secondary | ICD-10-CM

## 2012-05-10 DIAGNOSIS — R195 Other fecal abnormalities: Secondary | ICD-10-CM

## 2012-05-10 DIAGNOSIS — K219 Gastro-esophageal reflux disease without esophagitis: Secondary | ICD-10-CM | POA: Insufficient documentation

## 2012-05-10 DIAGNOSIS — Z9049 Acquired absence of other specified parts of digestive tract: Secondary | ICD-10-CM

## 2012-05-10 MED ORDER — OMEPRAZOLE 40 MG PO CPDR: 40.0000 mg | DELAYED_RELEASE_CAPSULE | Freq: Every day | ORAL | Status: DC

## 2012-05-10 MED ORDER — MOVIPREP 100 G PO SOLR: 1.0000 | Freq: Once | ORAL | Status: AC

## 2012-05-10 NOTE — Patient Instructions (Addendum)
We scheduled the colonoscopy with Dr. Yancey Flemings. Directions and brochure provided. We sent a prescription for Omeprazole 40 mg to your pharmacy. [place].

## 2012-05-10 NOTE — Progress Notes (Signed)
Subjective:    Patient ID: [name], female    DOB: Aug 28, 1961, 51 y.o.   MRN: [id number]  HPI Javiana  is a pleasant 51 year old white female known remotely to Dr. Marina Goodell from upper endoscopy done in 2007 for symptoms of GERD. She comes in today because of recent Hemoccult positive stool found on routine exam at her gynecologist. Patient herself has no complaints of abdominal pain or change in bowel habits. She has not noted any melena or hematochezia. She has not had any prior colonoscopic evaluation. She does have history of GERD and says usually her symptoms are dependent on dietary indiscretion, and generally occur at night waking her from sleep. She has no complaints of dysphagia or odynophagia. She had not been taking a PPI regularly but has been using some of her husband's Prilosec recently. She also has been on prescription strength ibuprofen over the past 4 months for joint pain and says she has a hard time getting by without the ibuprofen. She says she definitely will have abdominal discomfort if she takes the ibuprofen on an empty stomach.   Review of Systems  Constitutional: Negative.   HENT: Negative.   Eyes: Negative.   Respiratory: Negative.   Cardiovascular: Negative.   Gastrointestinal: Negative.   Genitourinary: Negative.   Musculoskeletal: Positive for back pain and arthralgias.  Skin: Negative.   Neurological: Negative.   Hematological: Negative.  Negative for adenopathy.  Psychiatric/Behavioral: Negative.    Outpatient Encounter Prescriptions as of 05/10/2012  Medication Sig Dispense Refill  . cyanocobalamin 1000 MCG tablet Take 100 mcg by mouth daily.      . Fish Oil OIL Take by mouth. Mega red      . glucosamine-chondroitin 500-400 MG tablet Take 1 tablet by mouth 1 day or 1 dose.      . hydrocodone-acetaminophen (LORCET-HD) 5-500 MG per capsule Take 1 capsule by mouth every 6 (six) hours as needed.      Marland Kitchen MOVIPREP 100 G SOLR Take 1 kit (100 g total) by mouth  once.  1 kit  0  . omeprazole (PRILOSEC) 40 MG capsule Take 1 capsule (40 mg total) by mouth daily.  30 capsule  11   No Known Allergies  Patient Active Problem List  Diagnosis  . Osteoarthritis  . S/P cholecystectomy  . GERD (gastroesophageal reflux disease)       History   Social History  . Marital Status: Married    Spouse Name: N/A    Number of Children: N/A  . Years of Education: N/A   Occupational History  . [employer]    Social History Main Topics  . Smoking status: Former Smoker    Types: Cigarettes    Quit date: 09/07/2001  . Smokeless tobacco: Never Used  . Alcohol Use: No  . Drug Use: No  . Sexually Active: Not on file   Other Topics Concern  . Not on file   Social History Narrative  . No narrative on file    Objective:   Physical Exam well-developed white female in no acute distress, pleasant blood pressure 130/80 pulse 80 height 5 foot 2 weight 228. HEENT ;nontraumatic normocephalic EOMI PERRLA sclera anicteric,Neck; Supple no JVD, Cardiovascular; regular rate and rhythm with S1-S2 no murmur or gallop, Pulmonary; clear bilaterally, Abdomen; soft, nontender, nondistended ,no palpable mass or hepatosplenomegaly , bowel sounds are active. Rectal; not done patient recent bleed documented Hemoccult positive on rectal exam per  OB/GYN, Extremities; no clubbing cyanosis or edema skin warm  and dry, Psych; mood and affect normal and appropriate       Assessment & Plan:  #72  51 year old female with asymptomatic Hemoccult-positive stool. She will need colonoscopy, for neoplasia screening. Would also consider an NSAID-induced gastropathy or enteropathy as a source for her Hemoccult-positive stool. #2 GERD Plan; schedule for colonoscopy with Dr. Marina Goodell. Procedure was discussed in detail with the patient and she is agreeable to proceed. Patient is given a prescription for omeprazole 40 mg by mouth daily and advised to take this on a regular basis along as she is on  chronic higher dose NSAIDs.

## 2012-05-10 NOTE — Progress Notes (Signed)
I agree with assessment and plan.

## 2012-05-11 ENCOUNTER — Ambulatory Visit: Payer: PRIVATE HEALTH INSURANCE | Admitting: Internal Medicine

## 2012-06-06 ENCOUNTER — Telehealth: Payer: Self-pay | Admitting: Internal Medicine

## 2012-06-06 NOTE — Telephone Encounter (Signed)
Discussed with pt that as long as she is not running a fever she should be ok for the procedure. Pt verbalized understanding.

## 2012-06-07 ENCOUNTER — Telehealth: Payer: Self-pay | Admitting: Internal Medicine

## 2012-06-08 ENCOUNTER — Encounter: Payer: PRIVATE HEALTH INSURANCE | Admitting: Internal Medicine

## 2012-07-12 ENCOUNTER — Other Ambulatory Visit (HOSPITAL_COMMUNITY): Payer: Self-pay | Admitting: Neurosurgery

## 2012-07-12 DIAGNOSIS — M542 Cervicalgia: Secondary | ICD-10-CM

## 2012-07-14 ENCOUNTER — Ambulatory Visit (AMBULATORY_SURGERY_CENTER): Payer: PRIVATE HEALTH INSURANCE | Admitting: Internal Medicine

## 2012-07-14 ENCOUNTER — Ambulatory Visit (HOSPITAL_COMMUNITY): Admission: RE | Admit: 2012-07-14 | Payer: PRIVATE HEALTH INSURANCE | Source: Ambulatory Visit

## 2012-07-14 ENCOUNTER — Encounter: Payer: Self-pay | Admitting: Internal Medicine

## 2012-07-14 VITALS — BP 121/56 | HR 68 | Temp 98.9°F | Resp 20 | Ht 62.0 in | Wt 228.0 lb

## 2012-07-14 DIAGNOSIS — R195 Other fecal abnormalities: Secondary | ICD-10-CM

## 2012-07-14 DIAGNOSIS — D126 Benign neoplasm of colon, unspecified: Secondary | ICD-10-CM

## 2012-07-14 DIAGNOSIS — Z1211 Encounter for screening for malignant neoplasm of colon: Secondary | ICD-10-CM

## 2012-07-14 MED ORDER — SODIUM CHLORIDE 0.9 % IV SOLN: 500.0000 mL | INTRAVENOUS | Status: DC

## 2012-07-14 NOTE — Patient Instructions (Signed)
YOU HAD AN ENDOSCOPIC PROCEDURE TODAY AT THE [facility]: Refer to the procedure report that was given to you for any specific questions about what was found during the examination.  If the procedure report does not answer your questions, please call your gastroenterologist to clarify.  If you requested that your care partner not be given the details of your procedure findings, then the procedure report has been included in a sealed envelope for you to review at your convenience later.  YOU SHOULD EXPECT: Some feelings of bloating in the abdomen. Passage of more gas than usual.  Walking can help get rid of the air that was put into your GI tract during the procedure and reduce the bloating. If you had a lower endoscopy (such as a colonoscopy or flexible sigmoidoscopy) you may notice spotting of blood in your stool or on the toilet paper. If you underwent a bowel prep for your procedure, then you may not have a normal bowel movement for a few days.  DIET: Your first meal following the procedure should be a light meal and then it is ok to progress to your normal diet.  A half-sandwich or bowl of soup is an example of a good first meal.  Heavy or fried foods are harder to digest and may make you feel nauseous or bloated.  Likewise meals heavy in dairy and vegetables can cause extra gas to form and this can also increase the bloating.  Drink plenty of fluids but you should avoid alcoholic beverages for 24 hours.  ACTIVITY: Your care partner should take you home directly after the procedure.  You should plan to take it easy, moving slowly for the rest of the day.  You can resume normal activity the day after the procedure however you should NOT DRIVE or use heavy machinery for 24 hours (because of the sedation medicines used during the test).    SYMPTOMS TO REPORT IMMEDIATELY: A gastroenterologist can be reached at any hour.  During normal business hours, 8:30 AM to 5:00 PM Monday through Friday,  call [phone].  After hours and on weekends, please call the GI answering service at [phone] who will take a message and have the physician on call contact you.   Following lower endoscopy (colonoscopy or flexible sigmoidoscopy):  Excessive amounts of blood in the stool  Significant tenderness or worsening of abdominal pains  Swelling of the abdomen that is new, acute  Fever of 100F or higher    FOLLOW UP: If any biopsies were taken you will be contacted by phone or by letter within the next 1-3 weeks.  Call your gastroenterologist if you have not heard about the biopsies in 3 weeks.  Our staff will call the home number listed on your records the next business day following your procedure to check on you and address any questions or concerns that you may have at that time regarding the information given to you following your procedure. This is a courtesy call and so if there is no answer at the home number and we have not heard from you through the emergency physician on call, we will assume that you have returned to your regular daily activities without incident.  SIGNATURES/CONFIDENTIALITY: You and/or your care partner have signed paperwork which will be entered into your electronic medical record.  These signatures attest to the fact that that the information above on your After Visit Summary has been reviewed and is understood.  Full responsibility of the confidentiality   of this discharge information lies with you and/or your care-partner.     

## 2012-07-14 NOTE — Progress Notes (Signed)
Patient did not experience any of the following events: a burn prior to discharge; a fall within the facility; wrong site/side/patient/procedure/implant event; or a hospital transfer or hospital admission upon discharge from the facility. (G8907) Patient did not have preoperative order for IV antibiotic SSI prophylaxis. (G8918)  

## 2012-07-14 NOTE — Op Note (Signed)
[facility] [address] [place], [address]   COLONOSCOPY PROCEDURE REPORT  PATIENT: [name], [name]  MR#: [id number] BIRTHDATE: 1961/07/13 , 51  yrs. old GENDER: Female ENDOSCOPIST: Roxy Cedar, MD REFERRED BY:.  Self / Office PROCEDURE DATE:  07/14/2012 PROCEDURE:   Colonoscopy with snare polypectomy    x 3 ASA CLASS:   Class II INDICATIONS:average risk patient for colon cancer and heme-positive stool. MEDICATIONS: MAC sedation, administered by CRNA and propofol (Diprivan) 750mg  IV  DESCRIPTION OF PROCEDURE:   After the risks benefits and alternatives of the procedure were thoroughly explained, informed consent was obtained.  A digital rectal exam revealed no abnormalities of the rectum.   The LB [id number]  endoscope was introduced through the anus and advanced to the cecum, which was identified by both the appendix and ileocecal valve. No adverse events experienced.   The quality of the prep was excellent, using MoviPrep  The instrument was then slowly withdrawn as the colon was fully examined.      COLON FINDINGS: Three adenomatous appearing polyps ranging between 3-70mm in size were found at the cecum, in the ascending colon, and transverse colon.  A polypectomy was performed with a cold snare. The resection was complete and the polyp tissue was completely retrieved.   Moderate diverticulosis was noted The finding was in the left colon.  Retroflexed views revealed internal hemorrhoids. The time to cecum=2 minutes 41 seconds.  Withdrawal time=15 minutes 45 seconds.  The scope was withdrawn and the procedure completed. COMPLICATIONS: There were no complications.  ENDOSCOPIC IMPRESSION: 1.   Three polyps ranging between 3-61mm in size were found at the cecum, in the ascending colon, and transverse colon; polypectomy was performed with a cold snare 2.   Moderate diverticulosis was noted in the left colon  RECOMMENDATIONS: 1. Repeat  Colonoscopy in 3 years, if polyps adenomatous.   eSigned:  Roxy Cedar, MD 07/14/2012 12:29 PM   cc: Rachel Nevins, MD and The Patient   PATIENT NAME:  [name], [name] MR#: [id number]

## 2012-07-15 ENCOUNTER — Telehealth: Payer: Self-pay | Admitting: *Deleted

## 2012-07-15 NOTE — Telephone Encounter (Signed)
No answer, left message to call if questions or concerns. 

## 2012-07-18 ENCOUNTER — Ambulatory Visit (HOSPITAL_COMMUNITY)
Admission: RE | Admit: 2012-07-18 | Discharge: 2012-07-18 | Disposition: A | Discharge status: Home / Self Care | Payer: PRIVATE HEALTH INSURANCE | Source: Ambulatory Visit | Attending: Neurosurgery | Admitting: Neurosurgery

## 2012-07-18 DIAGNOSIS — M542 Cervicalgia: Secondary | ICD-10-CM

## 2012-07-18 DIAGNOSIS — Z981 Arthrodesis status: Secondary | ICD-10-CM | POA: Insufficient documentation

## 2012-07-18 DIAGNOSIS — M47812 Spondylosis without myelopathy or radiculopathy, cervical region: Secondary | ICD-10-CM | POA: Insufficient documentation

## 2012-07-20 ENCOUNTER — Encounter: Payer: Self-pay | Admitting: Internal Medicine

## 2012-09-03 ENCOUNTER — Emergency Department (HOSPITAL_COMMUNITY): Payer: PRIVATE HEALTH INSURANCE

## 2012-09-03 ENCOUNTER — Encounter (HOSPITAL_COMMUNITY): Payer: Self-pay

## 2012-09-03 ENCOUNTER — Emergency Department (HOSPITAL_COMMUNITY)
Admission: EM | Admit: 2012-09-03 | Discharge: 2012-09-03 | Disposition: A | Discharge status: Home / Self Care | Payer: PRIVATE HEALTH INSURANCE | Attending: Emergency Medicine | Admitting: Emergency Medicine

## 2012-09-03 DIAGNOSIS — R197 Diarrhea, unspecified: Secondary | ICD-10-CM | POA: Insufficient documentation

## 2012-09-03 DIAGNOSIS — Z8679 Personal history of other diseases of the circulatory system: Secondary | ICD-10-CM | POA: Insufficient documentation

## 2012-09-03 DIAGNOSIS — M4120 Other idiopathic scoliosis, site unspecified: Secondary | ICD-10-CM | POA: Insufficient documentation

## 2012-09-03 DIAGNOSIS — Z9089 Acquired absence of other organs: Secondary | ICD-10-CM | POA: Insufficient documentation

## 2012-09-03 DIAGNOSIS — Z87891 Personal history of nicotine dependence: Secondary | ICD-10-CM | POA: Insufficient documentation

## 2012-09-03 DIAGNOSIS — K219 Gastro-esophageal reflux disease without esophagitis: Secondary | ICD-10-CM | POA: Insufficient documentation

## 2012-09-03 DIAGNOSIS — B349 Viral infection, unspecified: Secondary | ICD-10-CM

## 2012-09-03 DIAGNOSIS — R112 Nausea with vomiting, unspecified: Secondary | ICD-10-CM | POA: Insufficient documentation

## 2012-09-03 DIAGNOSIS — Z79899 Other long term (current) drug therapy: Secondary | ICD-10-CM | POA: Insufficient documentation

## 2012-09-03 DIAGNOSIS — B338 Other specified viral diseases: Secondary | ICD-10-CM | POA: Insufficient documentation

## 2012-09-03 HISTORY — DX: Scoliosis, unspecified: M41.9

## 2012-09-03 LAB — CBC WITH DIFFERENTIAL/PLATELET
HCT: 40.8 % (ref 36.0–46.0)
Hemoglobin: 13.7 g/dL (ref 12.0–15.0)
Lymphocytes Relative: 14 % (ref 12–46)
Lymphs Abs: 1.1 10*3/uL (ref 0.7–4.0)
Monocytes Absolute: 0.8 10*3/uL (ref 0.1–1.0)
Monocytes Relative: 10 % (ref 3–12)
Neutro Abs: 5.7 10*3/uL (ref 1.7–7.7)
WBC: 7.6 10*3/uL (ref 4.0–10.5)

## 2012-09-03 LAB — URINALYSIS, ROUTINE W REFLEX MICROSCOPIC
Bilirubin Urine: NEGATIVE
Glucose, UA: NEGATIVE mg/dL
Nitrite: NEGATIVE
Specific Gravity, Urine: >1.03 — ABNORMAL HIGH (ref 1.005–1.030)
pH: 5.5 (ref 5.0–8.0)

## 2012-09-03 LAB — URINE MICROSCOPIC-ADD ON

## 2012-09-03 LAB — BASIC METABOLIC PANEL
BUN: 12 mg/dL (ref 6–23)
CO2: 29 mEq/L (ref 19–32)
Calcium: 9.1 mg/dL (ref 8.4–10.5)
Chloride: 99 mEq/L (ref 96–112)
Creatinine, Ser: 0.67 mg/dL (ref 0.50–1.10)
GFR calc Af Amer: >90 mL/min (ref >90)
GFR calc non Af Amer: >90 mL/min (ref >90)
Glucose, Bld: 112 mg/dL — ABNORMAL HIGH (ref 70–99)
Potassium: 3.9 mEq/L (ref 3.5–5.1)
Sodium: 136 mEq/L (ref 135–145)

## 2012-09-03 MED ORDER — DIPHENHYDRAMINE HCL 50 MG/ML IJ SOLN
25.0000 mg | Freq: Once | INTRAMUSCULAR | Status: AC
  Administered 2012-09-03: 25 mg via INTRAVENOUS
  Filled 2012-09-03: qty 1

## 2012-09-03 MED ORDER — ONDANSETRON HCL 4 MG PO TABS: 4.0000 mg | ORAL_TABLET | Freq: Three times a day (TID) | ORAL | Status: DC | PRN

## 2012-09-03 MED ORDER — ACETAMINOPHEN 500 MG PO TABS
1000.0000 mg | ORAL_TABLET | Freq: Once | ORAL | Status: AC
  Administered 2012-09-03: 1000 mg via ORAL
  Filled 2012-09-03: qty 2

## 2012-09-03 MED ORDER — KETOROLAC TROMETHAMINE 30 MG/ML IJ SOLN
15.0000 mg | Freq: Once | INTRAMUSCULAR | Status: AC
  Administered 2012-09-03: 15 mg via INTRAVENOUS
  Filled 2012-09-03: qty 1

## 2012-09-03 MED ORDER — SODIUM CHLORIDE 0.9 % IV BOLUS (SEPSIS)
1000.0000 mL | Freq: Once | INTRAVENOUS | Status: AC
  Administered 2012-09-03: 1000 mL via INTRAVENOUS

## 2012-09-03 MED ORDER — METOCLOPRAMIDE HCL 5 MG/ML IJ SOLN
10.0000 mg | Freq: Once | INTRAMUSCULAR | Status: AC
  Administered 2012-09-03: 10 mg via INTRAVENOUS
  Filled 2012-09-03: qty 2

## 2012-09-03 MED ORDER — ONDANSETRON HCL 4 MG/2ML IJ SOLN
4.0000 mg | Freq: Once | INTRAMUSCULAR | Status: AC
  Administered 2012-09-03: 4 mg via INTRAVENOUS
  Filled 2012-09-03: qty 2

## 2012-09-03 MED ORDER — MORPHINE SULFATE 4 MG/ML IJ SOLN
4.0000 mg | Freq: Once | INTRAMUSCULAR | Status: AC
  Administered 2012-09-03: 4 mg via INTRAVENOUS
  Filled 2012-09-03: qty 1

## 2012-09-03 NOTE — ED Notes (Signed)
Pt reports cough for 2 days, has been exposed to someone who was sick.

## 2012-09-03 NOTE — ED Notes (Signed)
Pt woke this am with severe headache, nausea and vomiting. Went to pmd --given zofran injection and sent her for further eval

## 2012-09-03 NOTE — ED Provider Notes (Signed)
History     CSN: [id number]  Arrival date & time 09/03/12  4098   First MD Initiated Contact with Patient 09/03/12 1029      Chief Complaint  Patient presents with  . Headache  . Nausea  . Emesis    (Consider location/radiation/quality/duration/timing/severity/associated sxs/prior treatment) HPI Comments: [name] presents with a several day history of nausea,  Occasional episodes of diarrhea and just not feeling well,  Including a non productive cough without shortness of breath or chest pain.  She woke this am with severe headache with radiation into her neck and upper back.  She has had multiple episodes of emesis and also describes photophobia just today.  She was seen by her pcp this am,  Was given zofran which has helped the nausea,  But presents here for further evaluation.  She has had no sick contacts to her knowledge.  She has no medications prior to arrival today except for the zofran given her.  She does not have a history of migraine headaches but does have a history of chronic neck pain.  The history is provided by the patient and the spouse.    Past Medical History  Diagnosis Date  . GERD (gastroesophageal reflux disease)   . Mitral valve prolapse   . Scoliosis     Past Surgical History  Procedure Date  . Cholecystectomy   . Total hip arthroplasty   . Spinal fixation surgery   . Neck surgery   . Open heart surgery   . Leg surgery     dicron artery    Family History  Problem Relation Age of Onset  . Diabetes    . Heart disease      History  Substance Use Topics  . Smoking status: Former Smoker    Types: Cigarettes    Quit date: 09/07/2001  . Smokeless tobacco: Never Used  . Alcohol Use: No    OB History    Grav Para Term Preterm Abortions TAB SAB Ect Mult Living                  Review of Systems  Constitutional: Negative for fever.  HENT: Negative for congestion, sore throat, rhinorrhea and neck pain.   Eyes: Negative.     Respiratory: Negative for chest tightness and shortness of breath.   Cardiovascular: Negative for chest pain.  Gastrointestinal: Positive for nausea, vomiting and diarrhea. Negative for abdominal pain.  Genitourinary: Negative.   Musculoskeletal: Negative for joint swelling and arthralgias.  Skin: Negative.  Negative for rash and wound.  Neurological: Positive for headaches. Negative for dizziness, weakness, light-headedness and numbness.  Hematological: Negative.   Psychiatric/Behavioral: Negative.     Allergies  Review of patient's allergies indicates no known allergies.  Home Medications   Current Outpatient Rx  Name  Route  Sig  Dispense  Refill  . CO Q 10 PO   Oral   Take 1 capsule by mouth daily.          . CYANOCOBALAMIN 1000 MCG PO TABS   Oral   Take 100 mcg by mouth daily.         Marland Kitchen GLUCOSAMINE-CHONDROITIN 500-400 MG PO TABS   Oral   Take 2 tablets by mouth daily.          Marland Kitchen HYDROCODONE-ACETAMINOPHEN 5-500 MG PO CAPS   Oral   Take 1 capsule by mouth every 6 (six) hours as needed. For pain         .  OMEPRAZOLE 40 MG PO CPDR   Oral   Take 1 capsule (40 mg total) by mouth daily.   30 capsule   11   . PRESCRIPTION MEDICATION   Intramuscular   Inject 1 application into the muscle every 6 (six) months. Receives back and hip injections at [facility] every 6 months. Unknown name of medication         . ONDANSETRON HCL 4 MG PO TABS   Oral   Take 1 tablet (4 mg total) by mouth every 8 (eight) hours as needed for nausea.   12 tablet   0     BP 102/54  Pulse 83  Temp 98.4 F (36.9 C) (Oral)  Resp 18  Ht 5\' 2"  (1.575 m)  SpO2 94%  Physical Exam  Nursing note and vitals reviewed. Constitutional: She is oriented to person, place, and time. She appears well-developed and well-nourished.  HENT:  Head: Normocephalic and atraumatic.  Mouth/Throat: Oropharynx is clear and moist.  Eyes: EOM are normal. Pupils are equal, round, and reactive to  light.  Neck: Normal range of motion. Neck supple. Spinous process tenderness and muscular tenderness present.       No nuccal rigidity.  Cardiovascular: Normal rate and normal heart sounds.   Pulmonary/Chest: Effort normal.  Abdominal: Soft. There is no tenderness.  Musculoskeletal: Normal range of motion.  Lymphadenopathy:    She has no cervical adenopathy.  Neurological: She is alert and oriented to person, place, and time. She has normal strength. No cranial nerve deficit or sensory deficit. She exhibits normal muscle tone. Gait normal. GCS eye subscore is 4. GCS verbal subscore is 5. GCS motor subscore is 6.       Normal heel-shin,  Cranial nerves III-XII intact.  No pronator drift.  Skin: Skin is warm and dry. No rash noted.  Psychiatric: She has a normal mood and affect. Her speech is normal and behavior is normal. Thought content normal. Cognition and memory are normal.    ED Course  Procedures (including critical care time)  Labs Reviewed  BASIC METABOLIC PANEL - Abnormal; Notable for the following:    Glucose, Bld 112 (*)     All other components within normal limits  URINALYSIS, ROUTINE W REFLEX MICROSCOPIC - Abnormal; Notable for the following:    Specific Gravity, Urine >1.030 (*)     Hgb urine dipstick SMALL (*)     All other components within normal limits  CBC WITH DIFFERENTIAL  URINE MICROSCOPIC-ADD ON  LAB REPORT - SCANNED   Dg Chest 2 View  09/03/2012  *RADIOLOGY REPORT*  Clinical Data: Headache, vomiting, cough.  CHEST - 2 VIEW  Comparison: 06/12/2010  Findings: Heart is borderline enlarged.  No confluent airspace opacities, effusions or edema.  No acute bony abnormality. Post surgical hardware in the lower cervical spine and left upper thoracic paravertebral region.  IMPRESSION: Borderline heart size.  No acute findings.   Original Report Authenticated By: Charlett Nose, M.D.    Ct Head Wo Contrast  09/03/2012  *RADIOLOGY REPORT*  Clinical Data: Headache,  nausea, vomiting.  CT HEAD WITHOUT CONTRAST  Technique:  Contiguous axial images were obtained from the base of the skull through the vertex without contrast.  Comparison: None.  Findings: No acute intracranial abnormality.  Specifically, no hemorrhage, hydrocephalus, mass lesion, acute infarction, or significant intracranial injury.  No acute calvarial abnormality. Visualized paranasal sinuses and mastoids clear.  Orbital soft tissues unremarkable.  IMPRESSION: No intracranial abnormality.   Original Report  Authenticated By: Charlett Nose, M.D.      1. Viral syndrome     NS IV fluids given 1 liter,  Morphine 4 mg zofran 4 mg IV with improvement of headache from 10 to 7.  Labs,  Head Ct normal.    Pt also seen by Dr. Juleen China who recommended migraine specific meds - reglan, toradol and benadryl IV given with moderate relief of headache.  MDM  Viral syndrome/ flu like illness.  No exam findings suggestive of meningitis. Encouraged rest, fluids,  Tylenol or motrin for headache,  Prescribed zofran for continued nausea relief.  Recheck with pcp or return here if sx not improving,  Or any changes or worsening sx.         Burgess Amor, [place] 09/04/12 1218

## 2012-09-06 NOTE — ED Provider Notes (Signed)
Medical screening examination/treatment/procedure(s) were performed by non-physician practitioner and as supervising physician I was immediately available for consultation/collaboration.  Taevion Sikora, MD 09/06/12 1359 

## 2012-09-29 NOTE — Telephone Encounter (Signed)
Pt NOT BILLED Late Colon Cx fee/yf

## 2012-11-14 ENCOUNTER — Other Ambulatory Visit (HOSPITAL_COMMUNITY): Payer: Self-pay | Admitting: Sports Medicine

## 2012-11-14 DIAGNOSIS — M549 Dorsalgia, unspecified: Secondary | ICD-10-CM

## 2012-11-18 ENCOUNTER — Ambulatory Visit (HOSPITAL_COMMUNITY)
Admission: RE | Admit: 2012-11-18 | Discharge: 2012-11-18 | Disposition: A | Discharge status: Home / Self Care | Payer: PRIVATE HEALTH INSURANCE | Source: Ambulatory Visit | Attending: Sports Medicine | Admitting: Sports Medicine

## 2012-11-18 ENCOUNTER — Encounter (HOSPITAL_COMMUNITY): Payer: Self-pay

## 2012-11-18 DIAGNOSIS — M51379 Other intervertebral disc degeneration, lumbosacral region without mention of lumbar back pain or lower extremity pain: Secondary | ICD-10-CM | POA: Insufficient documentation

## 2012-11-18 DIAGNOSIS — M545 Low back pain, unspecified: Secondary | ICD-10-CM | POA: Insufficient documentation

## 2012-11-18 DIAGNOSIS — M5137 Other intervertebral disc degeneration, lumbosacral region: Secondary | ICD-10-CM | POA: Insufficient documentation

## 2012-11-18 DIAGNOSIS — M549 Dorsalgia, unspecified: Secondary | ICD-10-CM

## 2012-11-29 ENCOUNTER — Ambulatory Visit (HOSPITAL_COMMUNITY)
Admission: RE | Admit: 2012-11-29 | Discharge: 2012-11-29 | Disposition: A | Discharge status: Home / Self Care | Payer: PRIVATE HEALTH INSURANCE | Source: Ambulatory Visit | Attending: Sports Medicine | Admitting: Sports Medicine

## 2012-11-29 DIAGNOSIS — M6281 Muscle weakness (generalized): Secondary | ICD-10-CM | POA: Insufficient documentation

## 2012-11-29 DIAGNOSIS — M545 Low back pain, unspecified: Secondary | ICD-10-CM | POA: Insufficient documentation

## 2012-11-29 DIAGNOSIS — M25559 Pain in unspecified hip: Secondary | ICD-10-CM | POA: Insufficient documentation

## 2012-11-29 DIAGNOSIS — IMO0001 Reserved for inherently not codable concepts without codable children: Secondary | ICD-10-CM | POA: Insufficient documentation

## 2012-11-29 NOTE — Evaluation (Signed)
Physical Therapy Evaluation  Patient Details  Name: [name] MRN: [id number] Date of Birth: May 25, 1961  Today's Date: 11/29/2012 Time: 6213-0865 PT Time Calculation (min): 41 min Charges: 1 evaluation, 10' TE             Visit#: 1 of 8  Re-eval: 12/29/12 Assessment Diagnosis: rt hip pain Next MD Visit: Dr. Christell Constant - April 22  Past Medical History:  Past Medical History  Diagnosis Date  . GERD (gastroesophageal reflux disease)   . Mitral valve prolapse   . Scoliosis    Past Surgical History:  Past Surgical History  Procedure Laterality Date  . Cholecystectomy    . Total hip arthroplasty    . Spinal fixation surgery    . Neck surgery    . Open heart surgery    . Leg surgery      dicron artery    Subjective Symptoms/Limitations Pertinent History: Pt is referred to PT for R hip pain and weakness.  She has a significant hx of rt hip replacement 2 years ago (Dr. Charlann Boxer). C/o is difficulty lifting her rt leg (difficulty getting into the car, donning LE clothing), had to stop participating in yoga due to pain.  States her pain is in her groin and cannot touch it.  She has a hx of facet blocks (next on April 7th) recieves every 4-6 months (has had between 4-5 injections). Reports pelvic pain with intercourse, denies incontience.  Pain Assessment Currently in Pain?: Yes Pain Score:   6 Pain Location: Groin Pain Orientation: Right Pain Type: Chronic pain Pain Radiating Towards: right Pain Onset: Other (comment) (2 years) Pain Frequency: Constant Pain Relieving Factors: pain relievers (hydrocodone, from pain management MD) Effect of Pain on Daily Activities: getting up and down steps, cannot exercise,unable to place shoes and socks on  Balance Screening Balance Screen Has the patient fallen in the past 6 months: Yes How many times?: Stumbling from being unlevel surfaces Has the patient had a decrease in activity level because of a fear of falling? : No Is the patient  reluctant to leave their home because of a fear of falling? 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   Cognition/Observation Observation/Other Assessments Observations: decreased flexibility to rt quadricep, has increased pain to rt hip with lt knee flexion in prone.  Other Assessments: unable to perfrom rt straight leg raise (SLR) due to increased pain  Sensation/Coordination/Flexibility/Functional Tests Coordination Gross Motor Movements are Fluid and Coordinated: No Coordination and Movement Description: independent to pelvic floor contractions, mod VC and TC for multifidus and Transverse abdominous activitation. Decreased strength to rt lumbar mulifidus. Functional Tests Functional Tests: Lower extremity functional scale (LEFS)   Assessment RLE Strength Right Hip Flexion: 2+/5 Right Hip Extension: 3+/5 Right Hip ABduction:  (4+/5) Right Hip ADduction: 3/5 Palpation Palpation: pain and tenderness to rt lateral leq and quadricep muscle with moderate paipation.  Moderate fascial restridctions to scar tissue.  Mild pain to adductors.   Mobility/Balance  Ambulation/Gait Ambulation/Gait: Yes Ambulation/Gait Assistance: 7: Independent Assistive device: None Gait Pattern: Antalgic;Trendelenburg Static Standing Balance Static Standing - Comment/# of Minutes: WNL   Exercise/Treatments Stretches ITB Stretch: 1 rep;30 seconds (supine with rope) Supine Ab Set: 5 reps;5 seconds;Limitations AB Set Limitations: moderate VC and  TC  Bridge: 5 reps Prone  Other Prone Lumbar Exercises: multifidus: 5x5 sec holds with VC for anterior tilt  Other Prone Lumbar Exercises: pelvic floor contraction 5x5 sec  holds - independent  Physical Therapy Assessment and Plan PT Assessment and Plan Clinical Impression Statement: Pt is a 52 year old female referred to PT for rt hip and LBP with a significant hx of rt hip replacement and pelvic pain with impairments listed below.  Pt will benefit from skilled therapeutic intervention in order to improve on the following deficits: Abnormal gait;Decreased strength;Impaired flexibility;Pain;Decreased coordination;Impaired perceived functional ability;Increased fascial restricitons;Decreased balance Rehab Potential: Good PT Frequency: Min 2X/week PT Duration: 8 weeks PT Treatment/Interventions: Gait training;Stair training;Functional mobility training;Therapeutic activities;Therapeutic exercise;Balance training;Neuromuscular re-education;Patient/family education;Manual techniques;Modalities PT Plan: start with core strengthening activities, manual technquies and modalities (e-stim and heat) to decrease pain to rt hip/thigh region.  Progress flexibility exercises and include yoga poses that pt is familar with to progress towards goals for returning to yoga activities.     Goals Home Exercise Program Pt will Perform Home Exercise Program: Independently PT Goal: Perform Home Exercise Program - Progress: Goal set today PT Short Term Goals Time to Complete Short Term Goals: 4 weeks PT Short Term Goal 1: Pt will report rt hip and back pain less than a 4/10 for 50% of her day.  PT Short Term Goal 2: Pt will improve her rt LE functional strength in order to have greater ease with donning rt LE socks and shoes. PT Short Term Goal 3: Pt will present with mild fascial restriction to lateral thigh for improved QOL.  PT Short Term Goal 4: Pt will improve rt quadricep flexibility to 110 degrees without increased pain in order to comfortably return to yoga activities.  PT Long Term Goals Time to Complete Long Term Goals: 8 weeks PT Long Term Goal 1: Pt will improve her dynamic  balance and demonstrate tandem gait on dynamic surface independently to decrease fall risk. PT Long Term Goal 2: Pt will report a decrease in pelvic pain with intercourse to improve QOL.  Long Term Goal 3: Pt will report pain less than 3/10 for 75% of her day in order to improve QOL.  Long Term Goal 4: Pt will improve rt LE strength in order to ascend and descend 15 stairs with one handrail with greater ease with reciprocal pattern.   Problem List Patient Active Problem List  Diagnosis  . Osteoarthritis  . S/P cholecystectomy  . GERD (gastroesophageal reflux disease)  . Hip pain  . Lumbago    PT Plan of Care PT Home Exercise Plan: see scanned report PT Patient Instructions: proper posture and core activitation using teach back method.  pt independent at end of session with verbalization of importance of posture and core activitiation.  answered questions related to diagnosis.  Consulted and Agree with Plan of Care: Patient  Annett Fabian, PT 11/29/2012, 9:02 AM  Physician Documentation Your signature is required to indicate approval of the treatment plan as stated above.  Please sign and either send electronically or make a copy of this report for your files and return this physician signed original.   Please mark one 1.__approve of plan  2. ___approve of plan with the following conditions.   ______________________________  _____________________ Physician Signature                                                                                                             Date

## 2012-12-01 ENCOUNTER — Ambulatory Visit (HOSPITAL_COMMUNITY)
Admission: RE | Admit: 2012-12-01 | Discharge: 2012-12-01 | Disposition: A | Discharge status: Home / Self Care | Payer: PRIVATE HEALTH INSURANCE | Source: Ambulatory Visit | Attending: Sports Medicine | Admitting: Sports Medicine

## 2012-12-01 NOTE — Progress Notes (Signed)
Physical Therapy Treatment Patient Details  Name: [name] MRN: [id number] Date of Birth: 1960/10/29  Today's Date: 12/01/2012 Time: 1305-1400 PT Time Calculation (min): 55 min Charge: therex 38', Estim x 1, MHP x 1  Visit#: 2 of 8  Re-eval: 12/29/12   Subjective: Symptoms/Limitations Symptoms: Pt reported compliance with HEP 3x a day.  Rt hip pain 6/10 currently with pain increase hip flexion. Pain Assessment Currently in Pain?: Yes Pain Score:   6 Pain Location: Hip Pain Orientation: Right  Objective:   Exercise/Treatments Stretches ITB Stretch: 3 reps;30 seconds Supine Ab Set: 10 reps;Limitations AB Set Limitations: moderate VC and TC incorporate breathing Bent Knee Raise: 5 reps;5 seconds Bridge: 10 reps;Limitations Bridge Limitations: with yoga breatbing technique  Prone  Straight Leg Raise: 5 reps Other Prone Lumbar Exercises: multifidus: 5x10 sec holds with VC for anterior tilt  Other Prone Lumbar Exercises: pelvic floor contraction 5x5 sec holds - independent Quadruped Other Quadruped Lumbar Exercises: Marjo Bicker pose 3x 20"; Rt and Lt 1x 20"  Modalities Modalities: Electrical Stimulation;Moist Heat Moist Heat Therapy Number Minutes Moist Heat: 15 Minutes Moist Heat Location: Other (comment) (Rt hip Lt S/L with Estim and pillow between knees) Electrical Stimulation Electrical Stimulation Location: R hip Electrical Stimulation Action: reduce pain Electrical Stimulation Parameters: Interferential Estim Hi/lo sweep 23-27 V Electrical Stimulation Goals: Pain  Physical Therapy Assessment and Plan PT Assessment and Plan Clinical Impression Statement: Pt educated on proper technique getting in and out of bed to reduce stress on lower back and hip.  Began core strengthening activities with vc-ing for stability and multimodal cueing for correct musculature activation.  Pt stated Rt thoracic cramps during multifidus exercises.  Added yoga stretches to improve  flexibility.  Ended session with estim and heat, pt stated pain reduced end of session.   PT Plan: Continue with core strengthening activities, manual techniques and modalities to reduce pain to rt hip/thigh region.  Progress flexibility exercises and include yoga poses that pt is familiar with to progress towards gpa;s tp return to yoga activities.    Goals    Problem List Patient Active Problem List  Diagnosis  . Osteoarthritis  . S/P cholecystectomy  . GERD (gastroesophageal reflux disease)  . Hip pain  . Lumbago    PT - End of Session Activity Tolerance: Patient tolerated treatment well General Behavior During Session: [facility] for tasks performed Cognition: [facility] for tasks performed  GP    Juel Burrow 12/01/2012, 2:40 PM

## 2012-12-06 ENCOUNTER — Ambulatory Visit (HOSPITAL_COMMUNITY)
Admission: RE | Admit: 2012-12-06 | Discharge: 2012-12-06 | Disposition: A | Discharge status: Home / Self Care | Payer: PRIVATE HEALTH INSURANCE | Source: Ambulatory Visit | Attending: Sports Medicine | Admitting: Sports Medicine

## 2012-12-06 DIAGNOSIS — M25559 Pain in unspecified hip: Secondary | ICD-10-CM | POA: Insufficient documentation

## 2012-12-06 DIAGNOSIS — M6281 Muscle weakness (generalized): Secondary | ICD-10-CM | POA: Insufficient documentation

## 2012-12-06 DIAGNOSIS — IMO0001 Reserved for inherently not codable concepts without codable children: Secondary | ICD-10-CM | POA: Insufficient documentation

## 2012-12-06 DIAGNOSIS — M545 Low back pain, unspecified: Secondary | ICD-10-CM | POA: Insufficient documentation

## 2012-12-06 NOTE — Progress Notes (Signed)
Physical Therapy Treatment Patient Details  Name: [name] MRN: [id number] Date of Birth: 02/08/1961  Today's Date: 12/06/2012 Time: 0803-0857 PT Time Calculation (min): 54 min Charge: therex38', estim x1, MHP x1  Visit#: 3 of 8  Re-eval: 12/29/12    Subjective: Symptoms/Limitations Symptoms: Pt stated Rt hip is tight and stiff this morning, stated compliance with HEP with improved ability to raise hip flexion with less pain.  Overall Rt hip pain scale 5/10. Pain Assessment Currently in Pain?: Yes Pain Score:   5 Pain Location: Hip Pain Orientation: Right  Objective:   Exercise/Treatments Stretches Active Hamstring Stretch: 3 reps;30 seconds;Limitations Active Hamstring Stretch Limitations: bilateral Quad Stretch: 2 reps;30 seconds;Limitations Quad Stretch Limitations: Rt LE ITB Stretch: 2 reps;30 seconds Aerobic Tread Mill: 8' @ 1.8-->2.2 Supine Ab Set: 10 reps;Limitations AB Set Limitations: moderate VC and TC incorporate breathing Clam: 10 reps;5 seconds Bent Knee Raise: 10 reps;5 seconds Bridge: 10 reps;Limitations Bridge Limitations: with yoga breatbing technique  Quadruped Other Quadruped Lumbar Exercises: Childs pose 3x 20"; Rt and Lt 1x 20"  Modalities Modalities: Electrical Stimulation;Moist Heat Moist Heat Therapy Number Minutes Moist Heat: 15 Minutes Moist Heat Location: Other (comment) (Rt hip) Electrical Stimulation Electrical Stimulation Location: R hip Electrical Stimulation Action: reduce pain  Electrical Stimulation Parameters: Interferential estim hi/lo sweep 30 v Electrical Stimulation Goals: Pain  Physical Therapy Assessment and Plan PT Assessment and Plan Clinical Impression Statement: Pt with improved core activation with less tactile cueing required for correct musculature activation.  Added clam exercises for core strengthenig with cues for stability.  Ended session wtih estim and MHP, pt reported pain reduced end of session.   PT  Plan: Continue with core strengthening activities, manual techniques and modalities to reduce pain to rt hip/thigh region.  Progress flexibility exercises and include yoga poses that pt is familiar with to progress towards gpa;s tp return to yoga activities.    Goals    Problem List Patient Active Problem List  Diagnosis  . Osteoarthritis  . S/P cholecystectomy  . GERD (gastroesophageal reflux disease)  . Hip pain  . Lumbago    PT - End of Session Activity Tolerance: Patient tolerated treatment well General Behavior During Session: [facility] for tasks performed Cognition: [facility] for tasks performed  GP    Juel Burrow 12/06/2012, 10:25 AM

## 2012-12-09 ENCOUNTER — Ambulatory Visit (HOSPITAL_COMMUNITY)
Admission: RE | Admit: 2012-12-09 | Discharge: 2012-12-09 | Disposition: A | Discharge status: Home / Self Care | Payer: PRIVATE HEALTH INSURANCE | Source: Ambulatory Visit

## 2012-12-09 NOTE — Progress Notes (Signed)
Physical Therapy Treatment Patient Details  Name: [name] MRN: [id number] Date of Birth: November 23, 1960  Today's Date: 12/09/2012 Time: 7829-5621 PT Time Calculation (min): 54 min Charges: 39' TE, 1 estim, 1 heat Visit#: 4 of 8  Re-eval: 12/29/12    Subjective: Symptoms/Limitations Symptoms: Pt reports she is still sore but she feels it is helping.   Precautions/Restrictions     Exercise/Treatments Stretches Active Hamstring Stretch: 3 reps;30 seconds;Limitations Active Hamstring Stretch Limitations: bilateral (sitting on Airex) Aerobic Tread Mill: 8' @ 1.8-->2.2 Standing Heel Raises: 10 reps Functional Squats: 10 reps Other Standing Lumbar Exercises: warrior 1 and 2 pose Rt and Lt 4x30 sec Seated Other Seated Lumbar Exercises: Butterfly stretch on airex 2x30 sec  Supine Ab Set: 10 reps;Limitations AB Set Limitations: moderate VC and TC incorporate breathing Clam: 10 reps;5 seconds Bent Knee Raise: 10 reps Bridge: 10 reps;Limitations Bridge Limitations: with yoga breatbing technique  Large Ball Abdominal Isometric: 5 reps;5 seconds  Modalities Modalities: Electrical Stimulation;Moist Heat Moist Heat Therapy Number Minutes Moist Heat: 15 Minutes Moist Heat Location: Other (comment) (Rt hip) Electrical Stimulation Electrical Stimulation Location: Rt groin region over pain Electrical Stimulation Action: reduce pain Electrical Stimulation Parameters: Interferential estim hi/lo sweep 15 volts.  Pt educated how to control e-stim Electrical Stimulation Goals: Pain  Physical Therapy Assessment and Plan PT Assessment and Plan Clinical Impression Statement: Added yoga streching activities to improve flexibility and decrease pain.  Added e-stim over Rt groin region to decrease pain.  Pt has decreased pain after treatment today and demonstrates improved coordination and independence with core activities.  PT Plan: Continue with core strengthening activities, manual  techniques and modalities to reduce pain to rt hip/thigh region.  Progress flexibility exercises and include yoga poses that pt is familiar with to progress towards gpa;s tp return to yoga activities.    Goals    Problem List Patient Active Problem List  Diagnosis  . Osteoarthritis  . S/P cholecystectomy  . GERD (gastroesophageal reflux disease)  . Hip pain  . Lumbago   Sarayah Bacchi, PT 12/09/2012, 10:35 AM

## 2013-04-13 ENCOUNTER — Other Ambulatory Visit (HOSPITAL_COMMUNITY): Payer: Self-pay | Admitting: Internal Medicine

## 2013-04-13 DIAGNOSIS — Z139 Encounter for screening, unspecified: Secondary | ICD-10-CM

## 2013-04-17 ENCOUNTER — Ambulatory Visit (HOSPITAL_COMMUNITY)
Admission: RE | Admit: 2013-04-17 | Discharge: 2013-04-17 | Disposition: A | Discharge status: Home / Self Care | Payer: PRIVATE HEALTH INSURANCE | Source: Ambulatory Visit | Attending: Internal Medicine | Admitting: Internal Medicine

## 2013-04-17 DIAGNOSIS — Z139 Encounter for screening, unspecified: Secondary | ICD-10-CM

## 2013-04-17 DIAGNOSIS — Z1231 Encounter for screening mammogram for malignant neoplasm of breast: Secondary | ICD-10-CM | POA: Insufficient documentation

## 2014-03-05 ENCOUNTER — Other Ambulatory Visit (HOSPITAL_COMMUNITY): Payer: Self-pay | Admitting: Internal Medicine

## 2014-03-05 DIAGNOSIS — N949 Unspecified condition associated with female genital organs and menstrual cycle: Secondary | ICD-10-CM

## 2014-03-05 DIAGNOSIS — R102 Pelvic and perineal pain: Secondary | ICD-10-CM

## 2014-03-07 ENCOUNTER — Ambulatory Visit (HOSPITAL_COMMUNITY): Payer: PRIVATE HEALTH INSURANCE

## 2014-03-12 ENCOUNTER — Other Ambulatory Visit (HOSPITAL_COMMUNITY): Payer: Self-pay | Admitting: Internal Medicine

## 2014-03-12 ENCOUNTER — Ambulatory Visit (HOSPITAL_COMMUNITY): Admission: RE | Admit: 2014-03-12 | Payer: PRIVATE HEALTH INSURANCE | Source: Ambulatory Visit

## 2014-03-12 DIAGNOSIS — R102 Pelvic and perineal pain: Secondary | ICD-10-CM

## 2014-03-12 DIAGNOSIS — N949 Unspecified condition associated with female genital organs and menstrual cycle: Secondary | ICD-10-CM

## 2014-03-21 ENCOUNTER — Other Ambulatory Visit: Payer: Self-pay | Admitting: Obstetrics and Gynecology

## 2014-03-23 LAB — CYTOLOGY - PAP

## 2014-04-17 ENCOUNTER — Other Ambulatory Visit: Payer: Self-pay | Admitting: Dermatology

## 2014-05-16 ENCOUNTER — Other Ambulatory Visit: Payer: Self-pay | Admitting: Obstetrics and Gynecology

## 2014-06-12 ENCOUNTER — Other Ambulatory Visit (HOSPITAL_COMMUNITY): Payer: Self-pay | Admitting: Obstetrics and Gynecology

## 2014-06-12 ENCOUNTER — Other Ambulatory Visit (HOSPITAL_BASED_OUTPATIENT_CLINIC_OR_DEPARTMENT_OTHER): Payer: Self-pay | Admitting: Obstetrics and Gynecology

## 2014-06-12 DIAGNOSIS — Z1231 Encounter for screening mammogram for malignant neoplasm of breast: Secondary | ICD-10-CM

## 2014-06-18 ENCOUNTER — Ambulatory Visit (HOSPITAL_COMMUNITY)
Admission: RE | Admit: 2014-06-18 | Discharge: 2014-06-18 | Disposition: A | Discharge status: Home / Self Care | Payer: PRIVATE HEALTH INSURANCE | Source: Ambulatory Visit | Attending: Obstetrics and Gynecology | Admitting: Obstetrics and Gynecology

## 2014-06-18 DIAGNOSIS — Z1231 Encounter for screening mammogram for malignant neoplasm of breast: Secondary | ICD-10-CM | POA: Insufficient documentation

## 2014-06-19 ENCOUNTER — Encounter (HOSPITAL_COMMUNITY): Payer: Self-pay | Admitting: *Deleted

## 2014-07-02 NOTE — H&P (Signed)
  53 year old white female noted to have white lesion on right labia . 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 Now to undergo CO2 LASER abalation of this lesion.   Allergies:NKDA  Meds: Tramadol 50 mgs.             CoEnzyme Q10  B12  Glucosamine-Chondroitin   PMH: Positive GERD           Osteoarthritis  Sacroilieitis  UQJ:[id number]: NO SOB or cough           GI: No nausea,vomiting or diarrhea  GU: No dyusuria, frequency or urgency          GYN: No bleeding, discharge or itch  [id number]        BP 122/78        Resp: 16     Pulse 84  HEENT: PERRLA Chest: Clear Heart; regular rhythum no murrur or gallop Abdomen: soft without elargement of live, kidneys or spleen Pelvic Exam:   External genitalia: raised white lesion on right labia   Vagina: without lesion   Cervix: without lesion   Uterus: Normal size and shape non tender   Adnexa: without mass  Impression: High grade vulvar dysplasia  Plan: CO2 LASER ablation of vulvar dysplasia  Risks and benefits have been discussed and informed consent has been obtained.

## 2014-07-03 ENCOUNTER — Encounter (HOSPITAL_COMMUNITY)
Admission: RE | Disposition: A | Discharge status: Home / Self Care | Payer: Self-pay | Source: Ambulatory Visit | Attending: Obstetrics and Gynecology

## 2014-07-03 ENCOUNTER — Ambulatory Visit (HOSPITAL_COMMUNITY): Payer: PRIVATE HEALTH INSURANCE | Admitting: Anesthesiology

## 2014-07-03 ENCOUNTER — Encounter (HOSPITAL_COMMUNITY): Payer: PRIVATE HEALTH INSURANCE | Admitting: Anesthesiology

## 2014-07-03 ENCOUNTER — Ambulatory Visit (HOSPITAL_COMMUNITY)
Admission: RE | Admit: 2014-07-03 | Discharge: 2014-07-03 | Disposition: A | Discharge status: Home / Self Care | Payer: PRIVATE HEALTH INSURANCE | Source: Ambulatory Visit | Attending: Obstetrics and Gynecology | Admitting: Obstetrics and Gynecology

## 2014-07-03 ENCOUNTER — Encounter (HOSPITAL_COMMUNITY): Payer: Self-pay | Admitting: Anesthesiology

## 2014-07-03 DIAGNOSIS — Z6841 Body Mass Index (BMI) 40.0 and over, adult: Secondary | ICD-10-CM | POA: Insufficient documentation

## 2014-07-03 DIAGNOSIS — K219 Gastro-esophageal reflux disease without esophagitis: Secondary | ICD-10-CM | POA: Diagnosis not present

## 2014-07-03 DIAGNOSIS — D071 Carcinoma in situ of vulva: Secondary | ICD-10-CM | POA: Diagnosis present

## 2014-07-03 DIAGNOSIS — Z87891 Personal history of nicotine dependence: Secondary | ICD-10-CM | POA: Insufficient documentation

## 2014-07-03 HISTORY — PX: CO2 LASER APPLICATION: SHX5778

## 2014-07-03 LAB — BASIC METABOLIC PANEL
Anion gap: 9 (ref 5–15)
BUN: 9 mg/dL (ref 6–23)
CALCIUM: 9 mg/dL (ref 8.4–10.5)
CO2: 26 meq/L (ref 19–32)
CREATININE: 0.69 mg/dL (ref 0.50–1.10)
Chloride: 104 mEq/L (ref 96–112)
GFR calc Af Amer: >90 mL/min (ref >90)
GFR calc non Af Amer: >90 mL/min (ref >90)
GLUCOSE: 107 mg/dL — AB (ref 70–99)
Potassium: 4 mEq/L (ref 3.7–5.3)
Sodium: 139 mEq/L (ref 137–147)

## 2014-07-03 LAB — CBC
HCT: 43.8 % (ref 36.0–46.0)
Hemoglobin: 14.5 g/dL (ref 12.0–15.0)
MCH: 31 pg (ref 26.0–34.0)
MCHC: 33.1 g/dL (ref 30.0–36.0)
MCV: 93.6 fL (ref 78.0–100.0)
Platelets: 320 10*3/uL (ref 150–400)
RBC: 4.68 MIL/uL (ref 3.87–5.11)
RDW: 13.2 % (ref 11.5–15.5)
WBC: 7.3 10*3/uL (ref 4.0–10.5)

## 2014-07-03 SURGERY — CO2 LASER APPLICATION
Anesthesia: General | Site: Vagina

## 2014-07-03 MED ORDER — SCOPOLAMINE 1 MG/3DAYS TD PT72
MEDICATED_PATCH | TRANSDERMAL | Status: AC
  Filled 2014-07-03: qty 1

## 2014-07-03 MED ORDER — LACTATED RINGERS IV SOLN
INTRAVENOUS | Status: DC | PRN
  Administered 2014-07-03: 10:00:00 via INTRAVENOUS

## 2014-07-03 MED ORDER — SCOPOLAMINE 1 MG/3DAYS TD PT72
1.0000 | MEDICATED_PATCH | Freq: Once | TRANSDERMAL | Status: DC
  Administered 2014-07-03: 1.5 mg via TRANSDERMAL

## 2014-07-03 MED ORDER — METOCLOPRAMIDE HCL 5 MG/ML IJ SOLN: 10.0000 mg | Freq: Once | INTRAMUSCULAR | Status: DC | PRN

## 2014-07-03 MED ORDER — FENTANYL CITRATE 0.05 MG/ML IJ SOLN
INTRAMUSCULAR | Status: AC
  Filled 2014-07-03: qty 2

## 2014-07-03 MED ORDER — LIDOCAINE HCL (CARDIAC) 20 MG/ML IV SOLN
INTRAVENOUS | Status: AC
  Filled 2014-07-03: qty 5

## 2014-07-03 MED ORDER — OXYCODONE HCL 5 MG PO TABS: 5.0000 mg | ORAL_TABLET | Freq: Once | ORAL | Status: DC | PRN

## 2014-07-03 MED ORDER — DEXAMETHASONE SODIUM PHOSPHATE 4 MG/ML IJ SOLN
INTRAMUSCULAR | Status: AC
  Filled 2014-07-03: qty 1

## 2014-07-03 MED ORDER — ONDANSETRON HCL 4 MG/2ML IJ SOLN
INTRAMUSCULAR | Status: DC | PRN
  Administered 2014-07-03: 4 mg via INTRAVENOUS

## 2014-07-03 MED ORDER — MEPERIDINE HCL 25 MG/ML IJ SOLN: 6.2500 mg | INTRAMUSCULAR | Status: DC | PRN

## 2014-07-03 MED ORDER — MIDAZOLAM HCL 2 MG/2ML IJ SOLN
INTRAMUSCULAR | Status: AC
  Filled 2014-07-03: qty 2

## 2014-07-03 MED ORDER — FENTANYL CITRATE 0.05 MG/ML IJ SOLN
INTRAMUSCULAR | Status: DC | PRN
  Administered 2014-07-03: 50 ug via INTRAVENOUS
  Administered 2014-07-03 (×2): 25 ug via INTRAVENOUS

## 2014-07-03 MED ORDER — FERRIC SUBSULFATE 259 MG/GM EX SOLN
CUTANEOUS | Status: AC
  Filled 2014-07-03: qty 8

## 2014-07-03 MED ORDER — OXYCODONE HCL 5 MG/5ML PO SOLN: 5.0000 mg | Freq: Once | ORAL | Status: DC | PRN

## 2014-07-03 MED ORDER — DEXAMETHASONE SODIUM PHOSPHATE 10 MG/ML IJ SOLN
INTRAMUSCULAR | Status: DC | PRN
  Administered 2014-07-03: 4 mg via INTRAVENOUS

## 2014-07-03 MED ORDER — MIDAZOLAM HCL 2 MG/2ML IJ SOLN
INTRAMUSCULAR | Status: DC | PRN
  Administered 2014-07-03: 2 mg via INTRAVENOUS

## 2014-07-03 MED ORDER — LACTATED RINGERS IV SOLN
Freq: Once | INTRAVENOUS | Status: AC
  Administered 2014-07-03: 09:00:00 via INTRAVENOUS

## 2014-07-03 MED ORDER — PROPOFOL 10 MG/ML IV EMUL
INTRAVENOUS | Status: AC
  Filled 2014-07-03: qty 20

## 2014-07-03 MED ORDER — SILVER NITRATE-POT NITRATE 75-25 % EX MISC
CUTANEOUS | Status: AC
  Filled 2014-07-03: qty 1

## 2014-07-03 MED ORDER — KETOROLAC TROMETHAMINE 30 MG/ML IJ SOLN
INTRAMUSCULAR | Status: DC | PRN
  Administered 2014-07-03: 30 mg via INTRAVENOUS

## 2014-07-03 MED ORDER — PROPOFOL 10 MG/ML IV BOLUS
INTRAVENOUS | Status: DC | PRN
  Administered 2014-07-03: 160 mg via INTRAVENOUS
  Administered 2014-07-03: 40 mg via INTRAVENOUS

## 2014-07-03 MED ORDER — BUPIVACAINE HCL 0.25 % IJ SOLN
INTRAMUSCULAR | Status: DC | PRN
  Administered 2014-07-03: 10 mL

## 2014-07-03 MED ORDER — FENTANYL CITRATE 0.05 MG/ML IJ SOLN: 25.0000 ug | INTRAMUSCULAR | Status: DC | PRN

## 2014-07-03 MED ORDER — SILVER SULFADIAZINE 1 % EX CREA
TOPICAL_CREAM | CUTANEOUS | Status: DC | PRN
  Administered 2014-07-03: 1 via TOPICAL

## 2014-07-03 MED ORDER — KETOROLAC TROMETHAMINE 30 MG/ML IJ SOLN
INTRAMUSCULAR | Status: AC
  Filled 2014-07-03: qty 1

## 2014-07-03 MED ORDER — BUPIVACAINE HCL (PF) 0.25 % IJ SOLN
INTRAMUSCULAR | Status: AC
  Filled 2014-07-03: qty 30

## 2014-07-03 MED ORDER — LIDOCAINE HCL (CARDIAC) 20 MG/ML IV SOLN
INTRAVENOUS | Status: DC | PRN
  Administered 2014-07-03: 80 mg via INTRAVENOUS

## 2014-07-03 MED ORDER — ONDANSETRON HCL 4 MG/2ML IJ SOLN
INTRAMUSCULAR | Status: AC
  Filled 2014-07-03: qty 2

## 2014-07-03 MED ORDER — SILVER SULFADIAZINE 1 % EX CREA
TOPICAL_CREAM | CUTANEOUS | Status: AC
  Filled 2014-07-03: qty 50

## 2014-07-03 MED ORDER — IODINE STRONG (LUGOLS) 5 % PO SOLN
ORAL | Status: AC
  Filled 2014-07-03: qty 1

## 2014-07-03 MED ORDER — ACETIC ACID 5 % SOLN
Status: AC
  Filled 2014-07-03: qty 500

## 2014-07-03 SURGICAL SUPPLY — 20 items
APPLICATOR COTTON TIP 6IN STRL (MISCELLANEOUS) ×4 IMPLANT
CANISTER SUCT 3000ML (MISCELLANEOUS) IMPLANT
CATH ROBINSON RED A/P 16FR (CATHETERS) IMPLANT
CLOTH BEACON ORANGE TIMEOUT ST (SAFETY) ×2 IMPLANT
DRAPE UNDERBUTTOCKS STRL (DRAPE) IMPLANT
DRSG TELFA 3X8 NADH (GAUZE/BANDAGES/DRESSINGS) IMPLANT
EVACUATOR PREFILTER SMOKE (MISCELLANEOUS) ×2 IMPLANT
GLOVE ECLIPSE 7.5 STRL STRAW (GLOVE) ×2 IMPLANT
GOWN STRL REUS W/TWL LRG LVL3 (GOWN DISPOSABLE) ×4 IMPLANT
HOSE NS SMOKE EVAC 7/8 X6 (MISCELLANEOUS) ×2 IMPLANT
LEGGING LITHOTOMY PAIR STRL (DRAPES) IMPLANT
PAD DRESSING TELFA 3X8 NADH (GAUZE/BANDAGES/DRESSINGS) IMPLANT
PAD OB MATERNITY 4.3X12.25 (PERSONAL CARE ITEMS) ×2 IMPLANT
PAD PREP 24X48 CUFFED NSTRL (MISCELLANEOUS) ×2 IMPLANT
PENCIL BUTTON HOLSTER BLD 10FT (ELECTRODE) IMPLANT
REDUCER FITTING SMOKE EVAC (MISCELLANEOUS) ×2 IMPLANT
SCOPETTES 8  STERILE (MISCELLANEOUS) ×2
SCOPETTES 8 STERILE (MISCELLANEOUS) ×2 IMPLANT
TOWEL OR 17X24 6PK STRL BLUE (TOWEL DISPOSABLE) ×4 IMPLANT
YANKAUER SUCT BULB TIP NO VENT (SUCTIONS) IMPLANT

## 2014-07-03 NOTE — Anesthesia Preprocedure Evaluation (Addendum)
Anesthesia Evaluation  Patient identified by MRN, date of birth, ID band Patient awake    Reviewed: Allergy & Precautions, H&P , NPO status , Patient's Chart, lab work & pertinent test results  Airway Mallampati: III  TM Distance: >3 FB Neck ROM: Full    Dental  (+) Teeth Intact, Caps   Pulmonary former smoker,  breath sounds clear to auscultation  Pulmonary exam normal       Cardiovascular Rhythm:Regular Rate:Normal  Hx/o VSD repair   Neuro/Psych    GI/Hepatic GERD-  Medicated and Controlled,  Endo/Other  Morbid obesity  Renal/GU      Musculoskeletal  (+) Arthritis -, Scoliosis   Abdominal (+) + obese,   Peds  Hematology   Anesthesia Other Findings   Reproductive/Obstetrics VIN III                            Anesthesia Physical Anesthesia Plan  ASA: III  Anesthesia Plan: General   Post-op Pain Management:    Induction: Intravenous  Airway Management Planned: LMA  Additional Equipment:   Intra-op Plan:   Post-operative Plan: Extubation in OR  Informed Consent: I have reviewed the patients History and Physical, chart, labs and discussed the procedure including the risks, benefits and alternatives for the proposed anesthesia with the patient or authorized representative who has indicated his/her understanding and acceptance.   Dental advisory given  Plan Discussed with: CRNA, Anesthesiologist and Surgeon  Anesthesia Plan Comments:         Anesthesia Quick Evaluation

## 2014-07-03 NOTE — Transfer of Care (Signed)
Immediate Anesthesia Transfer of Care Note  Patient: [name]  Procedure(s) Performed: Procedure(s): CO2 LASER APPLICATION (N/A)  Patient Location: PACU  Anesthesia Type:General  Level of Consciousness: awake, alert , oriented and patient cooperative  Airway & Oxygen Therapy: Patient Spontanous Breathing and Patient connected to nasal cannula oxygen  Post-op Assessment: Report given to PACU RN and Post -op Vital signs reviewed and stable  Post vital signs: Reviewed and stable  Complications: No apparent anesthesia complications

## 2014-07-03 NOTE — Anesthesia Postprocedure Evaluation (Signed)
  Anesthesia Post-op Note  Patient: [name]  Procedure(s) Performed: Procedure(s): CO2 LASER APPLICATION (N/A)  Patient Location: PACU  Anesthesia Type:General  Level of Consciousness: awake, alert  and oriented  Airway and Oxygen Therapy: Patient Spontanous Breathing  Post-op Pain: none  Post-op Assessment: Post-op Vital signs reviewed, Patient's Cardiovascular Status Stable, Respiratory Function Stable, Patent Airway, No signs of Nausea or vomiting and Pain level controlled  Post-op Vital Signs: Reviewed and stable  Last Vitals:  Filed Vitals:   07/03/14 1034  BP: 112/61  Pulse: 72  Temp: 36.8 C  Resp: 14    Complications: No apparent anesthesia complications

## 2014-07-03 NOTE — Discharge Instructions (Signed)
DISCHARGE INSTRUCTIONS: CO2 LASER SURGERY The following instructions have been prepared to help you care for yourself upon your return home.  MAY REMOVE SCOP PATCH ON OR BEFORE 07/06/2014  MAY TAKE IBUPROFEN, ADVIL, MOTRIN AFTER 4:23 P.M. IF NEEDED FOR PAIN  Personal hygiene:  Use sanitary pads for vaginal drainage, not tampons.  Shower the day after your procedure.  NO tub baths, pools or Jacuzzis for 2-3 weeks.  Wipe front to back after using the bathroom.  Activity and limitations:  Do NOT drive or operate any equipment for 24 hours. The effects of anesthesia are still present and drowsiness may result.  Do NOT rest in bed all day.  Walking is encouraged.  Walk up and down stairs slowly.  You may resume your normal activity in one to two days or as indicated by your physician. Sexual activity: NO intercourse for at least 2 weeks after the procedure, or as indicated by your Doctor.  Diet: Eat a light meal as desired this evening. You may resume your usual diet tomorrow.  Return to Work: You may resume your work activities in one to two days or as indicated by Marine scientist.  What to expect after your surgery: Expect to have vaginal bleeding/discharge for 2-3 days and spotting for up to 10 days. It is not unusual to have soreness for up to 1-2 weeks. You may have a slight burning sensation when you urinate for the first day. Mild cramps may continue for a couple of days. You may have a regular period in 2-6 weeks.  Call your doctor for any of the following:  Excessive vaginal bleeding or clotting, saturating and changing one pad every hour.  Inability to urinate 6 hours after discharge from hospital.  Pain not relieved by pain medication.  Fever of 100.4 F or greater.  Unusual vaginal discharge or odor.   [facility] [phone]

## 2014-07-03 NOTE — Brief Op Note (Signed)
07/03/2014  10:30 AM  PATIENT:  [name]  53 y.o. female  PRE-OPERATIVE DIAGNOSIS:  VIN III  POST-OPERATIVE DIAGNOSIS:  VIN III  PROCEDURE:  Procedure(s): CO2 LASER APPLICATION (N/A)  SURGEON:  Surgeon(s) and Role:    * Gus Height, MD - Primary   ANESTHESIA:   general  EBL:  Total I/O In: 900 [I.V.:900] Out: 0   BLOOD ADMINISTERED:none  DRAINS: none   LOCAL MEDICATIONS USED:  MARCAINE     SPECIMEN:  No Specimen  DISPOSITION OF SPECIMEN:  N/A  COUNTS:  YES  TOURNIQUET:  * No tourniquets in log *  DICTATION: .Other Dictation: Dictation Number [id number]  PLAN OF CARE: Discharge to home after PACU  PATIENT DISPOSITION:  PACU - hemodynamically stable.   Delay start of Pharmacological VTE agent (>24hrs) due to surgical blood loss or risk of bleeding: not applicable

## 2014-07-03 NOTE — H&P (Signed)
  Status unchanged will proceed with LASER ablation of vulvar high grade dysplasia

## 2014-07-03 NOTE — Op Note (Signed)
[name], [name] NO.:  [id number]  MEDICAL RECORD NO.:  [id number]  LOCATION:  [facility]                          FACILITY:  [facility]  PHYSICIAN:  Gus Height, M.D.       DATE OF BIRTH:  1960-12-04  DATE OF PROCEDURE:  07/03/2014 DATE OF DISCHARGE:                              OPERATIVE REPORT   PREOPERATIVE DIAGNOSIS:  High-grade vulvar dysplasia.  POSTOPERATIVE DIAGNOSIS:  High-grade vulvar dysplasia.  PROCEDURE:  Laser ablation of vulvar dysplasia.  SURGEON:  Gus Height, MD  ANESTHESIA:  General.  COMPLICATIONS:  None.  JUSTIFICATION:  The patient is a 53 year old white female, noted to have a white lesion on her labia majora, in addition, noted to have lesions consistent with lichen sclerosus.  These areas were treated with Temovate cream with resolution of lichen sclerosus.  However, this white area on the left labia persisted as well as a small white area on the right labia.  Biopsies of these regions revealed high-grade vulvar dysplasia but no invasive disease.  Because of this, she is being taken to the operating room to undergo laser vaporization and ablation of the vulvar dysplasia.  Risks and benefits of the procedure have been discussed with the patient and informed consent has been obtained.  DESCRIPTION OF PROCEDURE:  The patient was taken to the operating room, placed in a supine position.  General anesthesia was administered without difficulty.  She was then placed in dorsal lithotomy position and prepped and draped in usual sterile fashion.  Once this was done, examination of vulva was carried out.  The raised white lesion on the left labia majora was identified and outlined using a marking pen with an approximate 1 cm margin about the lesion when marked.  A smaller lesion on the right labia was identified and also marked.  At this point, with the CO2 laser at 10 watts of power, it was possible to laser ablate these regions without difficulty  including the margin about the lesions.  Once this was done, the lesions were debrided with saline. The ablation sites injected with a total of 10 mL of 0.25% Marcaine and then Silvadene cream was applied.  This completed the procedure.  The patient was reversed from anesthetic and taken to the recovery room in satisfactory condition.  The estimated blood loss was zero.  Plan is for the patient to be discharged home.  She will be seen back in 4 weeks for followup examination.  She is instructed to use warm tub baths daily and apply Silvadene cream for 7-10 days.  She will be seen back in 4 weeks for followup examination.  She is to call for any problems such as fever, pain, or heavy bleeding.  She was sent home with Vicodin as needed for pain.     Gus Height, M.D.     AR/MEDQ  D:  07/03/2014  T:  07/03/2014  Job:  [id number]

## 2014-07-04 ENCOUNTER — Encounter (HOSPITAL_COMMUNITY): Payer: Self-pay | Admitting: Obstetrics and Gynecology

## 2014-07-30 ENCOUNTER — Other Ambulatory Visit (HOSPITAL_COMMUNITY): Payer: Self-pay | Admitting: Internal Medicine

## 2014-07-30 ENCOUNTER — Ambulatory Visit (HOSPITAL_COMMUNITY)
Admission: RE | Admit: 2014-07-30 | Discharge: 2014-07-30 | Disposition: A | Discharge status: Home / Self Care | Payer: PRIVATE HEALTH INSURANCE | Source: Ambulatory Visit | Attending: Internal Medicine | Admitting: Internal Medicine

## 2014-07-30 DIAGNOSIS — S2231XA Fracture of one rib, right side, initial encounter for closed fracture: Secondary | ICD-10-CM

## 2014-07-30 DIAGNOSIS — R0781 Pleurodynia: Secondary | ICD-10-CM | POA: Diagnosis present

## 2014-07-30 DIAGNOSIS — W19XXXA Unspecified fall, initial encounter: Secondary | ICD-10-CM | POA: Diagnosis not present

## 2015-05-24 ENCOUNTER — Other Ambulatory Visit: Payer: Self-pay | Admitting: Obstetrics and Gynecology

## 2015-05-27 LAB — CYTOLOGY - PAP

## 2015-06-03 ENCOUNTER — Other Ambulatory Visit: Payer: Self-pay | Admitting: Obstetrics and Gynecology

## 2015-07-09 ENCOUNTER — Other Ambulatory Visit: Payer: Self-pay | Admitting: Obstetrics and Gynecology

## 2015-07-22 ENCOUNTER — Encounter: Payer: Self-pay | Admitting: Internal Medicine

## 2015-08-06 ENCOUNTER — Other Ambulatory Visit (HOSPITAL_COMMUNITY): Payer: Self-pay | Admitting: Internal Medicine

## 2015-08-06 DIAGNOSIS — Z1231 Encounter for screening mammogram for malignant neoplasm of breast: Secondary | ICD-10-CM

## 2015-08-12 ENCOUNTER — Ambulatory Visit (HOSPITAL_COMMUNITY)
Admission: RE | Admit: 2015-08-12 | Discharge: 2015-08-12 | Disposition: A | Discharge status: Home / Self Care | Payer: PRIVATE HEALTH INSURANCE | Source: Ambulatory Visit | Attending: Internal Medicine | Admitting: Internal Medicine

## 2015-08-12 DIAGNOSIS — Z1231 Encounter for screening mammogram for malignant neoplasm of breast: Secondary | ICD-10-CM | POA: Diagnosis present

## 2015-08-12 DIAGNOSIS — R928 Other abnormal and inconclusive findings on diagnostic imaging of breast: Secondary | ICD-10-CM | POA: Diagnosis not present

## 2015-08-14 ENCOUNTER — Other Ambulatory Visit: Payer: Self-pay | Admitting: Internal Medicine

## 2015-08-14 DIAGNOSIS — R928 Other abnormal and inconclusive findings on diagnostic imaging of breast: Secondary | ICD-10-CM

## 2015-08-20 ENCOUNTER — Ambulatory Visit
Admission: RE | Admit: 2015-08-20 | Discharge: 2015-08-20 | Disposition: A | Discharge status: Home / Self Care | Payer: PRIVATE HEALTH INSURANCE | Source: Ambulatory Visit | Attending: Internal Medicine | Admitting: Internal Medicine

## 2015-08-20 DIAGNOSIS — R928 Other abnormal and inconclusive findings on diagnostic imaging of breast: Secondary | ICD-10-CM

## 2015-08-30 ENCOUNTER — Other Ambulatory Visit: Payer: Self-pay | Admitting: Obstetrics and Gynecology

## 2015-08-30 NOTE — H&P (Signed)
[name] is an 54 y.o. female.   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 She was seen for her annual exam in late 2016 and noted to have an area on her left labia that was concerning for recurrent VIN3. Biopsy confirmed VIN3. Management options were reviewed and she wishes to proceed with WLE of the vulva.    Menstrual History: Postmenopausal No LMP recorded. Patient is postmenopausal.    Past Medical History  Diagnosis Date  . GERD (gastroesophageal reflux disease)   . Mitral valve prolapse   . Scoliosis     Past Surgical History  Procedure Laterality Date  . Cholecystectomy    . Total hip arthroplasty    . Spinal fixation surgery    . Neck surgery    . Open heart surgery    . Leg surgery      dicron artery  . Co2 laser application N/A [id number]    Procedure: CO2 LASER APPLICATION;  Surgeon: Gus Height, MD;  Location: [facility];  Service: Gynecology;  Laterality: N/A;    Family History  Problem Relation Age of Onset  . Diabetes    . Heart disease      Social History:  reports that she quit smoking about 13 years ago. Her smoking use included Cigarettes. She has never used smokeless tobacco. She reports that she does not drink alcohol or use illicit drugs.  Allergies: No Known Allergies   (Not in a hospital admission)  ROS: as above  There were no vitals taken for this visit. Physical Exam   AOX3 NAD 38mm area on Left labia majora c/w Vin 3  No results found for this or any previous visit (from the past 24 hour(s)).  No results found.  Assessment/Plan: 1) Admit 2) SCDs to OR 3) Proceed with WLE  Chett Taniguchi H. 08/30/2015, 10:52 AM

## 2015-09-05 ENCOUNTER — Encounter (HOSPITAL_COMMUNITY): Payer: Self-pay

## 2015-09-10 ENCOUNTER — Encounter (HOSPITAL_COMMUNITY): Payer: PRIVATE HEALTH INSURANCE

## 2015-09-18 ENCOUNTER — Encounter (HOSPITAL_COMMUNITY): Payer: Self-pay | Admitting: Emergency Medicine

## 2015-09-18 ENCOUNTER — Encounter (HOSPITAL_COMMUNITY)
Admission: RE | Disposition: A | Discharge status: Home / Self Care | Payer: Self-pay | Source: Ambulatory Visit | Attending: Obstetrics and Gynecology

## 2015-09-18 ENCOUNTER — Ambulatory Visit (HOSPITAL_COMMUNITY): Payer: PRIVATE HEALTH INSURANCE | Admitting: Anesthesiology

## 2015-09-18 ENCOUNTER — Ambulatory Visit (HOSPITAL_COMMUNITY)
Admission: RE | Admit: 2015-09-18 | Discharge: 2015-09-18 | Disposition: A | Discharge status: Home / Self Care | Payer: PRIVATE HEALTH INSURANCE | Source: Ambulatory Visit | Attending: Obstetrics and Gynecology | Admitting: Obstetrics and Gynecology

## 2015-09-18 DIAGNOSIS — Z8249 Family history of ischemic heart disease and other diseases of the circulatory system: Secondary | ICD-10-CM | POA: Diagnosis not present

## 2015-09-18 DIAGNOSIS — Z85828 Personal history of other malignant neoplasm of skin: Secondary | ICD-10-CM | POA: Diagnosis not present

## 2015-09-18 DIAGNOSIS — Z79899 Other long term (current) drug therapy: Secondary | ICD-10-CM | POA: Diagnosis not present

## 2015-09-18 DIAGNOSIS — I341 Nonrheumatic mitral (valve) prolapse: Secondary | ICD-10-CM | POA: Insufficient documentation

## 2015-09-18 DIAGNOSIS — D071 Carcinoma in situ of vulva: Secondary | ICD-10-CM | POA: Insufficient documentation

## 2015-09-18 DIAGNOSIS — K219 Gastro-esophageal reflux disease without esophagitis: Secondary | ICD-10-CM | POA: Insufficient documentation

## 2015-09-18 HISTORY — PX: VULVECTOMY: SHX1086

## 2015-09-18 HISTORY — DX: Malignant (primary) neoplasm, unspecified: C80.1

## 2015-09-18 LAB — CBC
HCT: 44.8 % (ref 36.0–46.0)
Hemoglobin: 14.9 g/dL (ref 12.0–15.0)
MCH: 31 pg (ref 26.0–34.0)
MCHC: 33.3 g/dL (ref 30.0–36.0)
MCV: 93.1 fL (ref 78.0–100.0)
PLATELETS: 307 10*3/uL (ref 150–400)
RBC: 4.81 MIL/uL (ref 3.87–5.11)
RDW: 13.5 % (ref 11.5–15.5)
WBC: 8.9 10*3/uL (ref 4.0–10.5)

## 2015-09-18 SURGERY — WIDE EXCISION VULVECTOMY
Anesthesia: General

## 2015-09-18 MED ORDER — KETOROLAC TROMETHAMINE 30 MG/ML IJ SOLN
INTRAMUSCULAR | Status: AC
  Filled 2015-09-18: qty 1

## 2015-09-18 MED ORDER — DEXAMETHASONE SODIUM PHOSPHATE 10 MG/ML IJ SOLN
INTRAMUSCULAR | Status: DC | PRN
  Administered 2015-09-18: 4 mg via INTRAVENOUS

## 2015-09-18 MED ORDER — OXYCODONE HCL 5 MG/5ML PO SOLN: 5.0000 mg | Freq: Once | ORAL | Status: DC | PRN

## 2015-09-18 MED ORDER — KETOROLAC TROMETHAMINE 30 MG/ML IJ SOLN
INTRAMUSCULAR | Status: DC | PRN
  Administered 2015-09-18: 30 mg via INTRAVENOUS

## 2015-09-18 MED ORDER — PHENYLEPHRINE HCL 10 MG/ML IJ SOLN
INTRAMUSCULAR | Status: DC | PRN
  Administered 2015-09-18: 40 ug via INTRAVENOUS

## 2015-09-18 MED ORDER — GLYCOPYRROLATE 0.2 MG/ML IJ SOLN
INTRAMUSCULAR | Status: DC | PRN
  Administered 2015-09-18 (×2): 0.1 mg via INTRAVENOUS

## 2015-09-18 MED ORDER — ONDANSETRON HCL 4 MG/2ML IJ SOLN
INTRAMUSCULAR | Status: AC
  Filled 2015-09-18: qty 2

## 2015-09-18 MED ORDER — HYDROMORPHONE HCL 1 MG/ML IJ SOLN: 0.2500 mg | INTRAMUSCULAR | Status: DC | PRN

## 2015-09-18 MED ORDER — LIDOCAINE HCL (CARDIAC) 20 MG/ML IV SOLN
INTRAVENOUS | Status: AC
  Filled 2015-09-18: qty 5

## 2015-09-18 MED ORDER — OXYCODONE-ACETAMINOPHEN 5-325 MG PO TABS: 2.0000 | ORAL_TABLET | ORAL | Status: DC | PRN

## 2015-09-18 MED ORDER — DOCUSATE SODIUM 100 MG PO CAPS: 100.0000 mg | ORAL_CAPSULE | Freq: Two times a day (BID) | ORAL | Status: DC

## 2015-09-18 MED ORDER — LIDOCAINE-EPINEPHRINE 1 %-1:100000 IJ SOLN
INTRAMUSCULAR | Status: DC | PRN
Start: 2015-09-18 — End: 2015-09-18
  Administered 2015-09-18: 7 mL

## 2015-09-18 MED ORDER — PHENYLEPHRINE 40 MCG/ML (10ML) SYRINGE FOR IV PUSH (FOR BLOOD PRESSURE SUPPORT)
PREFILLED_SYRINGE | INTRAVENOUS | Status: AC
  Filled 2015-09-18: qty 10

## 2015-09-18 MED ORDER — FENTANYL CITRATE (PF) 100 MCG/2ML IJ SOLN
INTRAMUSCULAR | Status: DC | PRN
Start: 2015-09-18 — End: 2015-09-18
  Administered 2015-09-18 (×2): 25 ug via INTRAVENOUS
  Administered 2015-09-18 (×2): 50 ug via INTRAVENOUS

## 2015-09-18 MED ORDER — ONDANSETRON HCL 4 MG/2ML IJ SOLN: 4.0000 mg | Freq: Four times a day (QID) | INTRAMUSCULAR | Status: DC | PRN

## 2015-09-18 MED ORDER — MIDAZOLAM HCL 2 MG/2ML IJ SOLN
INTRAMUSCULAR | Status: AC
  Filled 2015-09-18: qty 2

## 2015-09-18 MED ORDER — SCOPOLAMINE 1 MG/3DAYS TD PT72
1.0000 | MEDICATED_PATCH | Freq: Once | TRANSDERMAL | Status: DC
  Administered 2015-09-18: 1.5 mg via TRANSDERMAL

## 2015-09-18 MED ORDER — SCOPOLAMINE 1 MG/3DAYS TD PT72
MEDICATED_PATCH | TRANSDERMAL | Status: AC
  Administered 2015-09-18: 1.5 mg via TRANSDERMAL
  Filled 2015-09-18: qty 1

## 2015-09-18 MED ORDER — OXYCODONE HCL 5 MG PO TABS: 5.0000 mg | ORAL_TABLET | Freq: Once | ORAL | Status: DC | PRN

## 2015-09-18 MED ORDER — DEXAMETHASONE SODIUM PHOSPHATE 10 MG/ML IJ SOLN
INTRAMUSCULAR | Status: AC
  Filled 2015-09-18: qty 1

## 2015-09-18 MED ORDER — MIDAZOLAM HCL 2 MG/2ML IJ SOLN
INTRAMUSCULAR | Status: DC | PRN
  Administered 2015-09-18: 2 mg via INTRAVENOUS

## 2015-09-18 MED ORDER — PROPOFOL 10 MG/ML IV BOLUS: INTRAVENOUS | Status: DC | PRN

## 2015-09-18 MED ORDER — LIDOCAINE HCL (CARDIAC) 20 MG/ML IV SOLN
INTRAVENOUS | Status: DC | PRN
  Administered 2015-09-18: 80 mg via INTRAVENOUS
  Administered 2015-09-18: 20 mg via INTRAVENOUS

## 2015-09-18 MED ORDER — ONDANSETRON HCL 4 MG/2ML IJ SOLN
INTRAMUSCULAR | Status: DC | PRN
  Administered 2015-09-18: 4 mg via INTRAVENOUS

## 2015-09-18 MED ORDER — LACTATED RINGERS IV SOLN
INTRAVENOUS | Status: DC
  Administered 2015-09-18 (×2): via INTRAVENOUS

## 2015-09-18 MED ORDER — LIDOCAINE-EPINEPHRINE 1 %-1:100000 IJ SOLN
INTRAMUSCULAR | Status: AC
  Filled 2015-09-18: qty 1

## 2015-09-18 MED ORDER — LACTATED RINGERS IV SOLN
INTRAVENOUS | Status: DC
  Administered 2015-09-18: 11:00:00 via INTRAVENOUS

## 2015-09-18 MED ORDER — LIDOCAINE HCL 1 % IJ SOLN
INTRAMUSCULAR | Status: AC
  Filled 2015-09-18: qty 20

## 2015-09-18 MED ORDER — PROPOFOL 10 MG/ML IV BOLUS
INTRAVENOUS | Status: AC
  Filled 2015-09-18: qty 40

## 2015-09-18 MED ORDER — FENTANYL CITRATE (PF) 250 MCG/5ML IJ SOLN
INTRAMUSCULAR | Status: AC
  Filled 2015-09-18: qty 5

## 2015-09-18 MED ORDER — PROPOFOL 10 MG/ML IV BOLUS
INTRAVENOUS | Status: DC | PRN
  Administered 2015-09-18: 200 mg via INTRAVENOUS

## 2015-09-18 MED ORDER — DEXAMETHASONE SODIUM PHOSPHATE 4 MG/ML IJ SOLN
INTRAMUSCULAR | Status: AC
  Filled 2015-09-18: qty 1

## 2015-09-18 SURGICAL SUPPLY — 24 items
BLADE SURG 15 STRL LF C SS BP (BLADE) ×1 IMPLANT
BLADE SURG 15 STRL SS (BLADE) ×2
CLOTH BEACON ORANGE TIMEOUT ST (SAFETY) ×2 IMPLANT
CONTAINER PREFILL 10% NBF 15ML (MISCELLANEOUS) ×2 IMPLANT
COUNTER NEEDLE 1200 MAGNETIC (NEEDLE) ×1 IMPLANT
ELECT NDL TIP 2.8 STRL (NEEDLE) IMPLANT
ELECT NEEDLE TIP 2.8 STRL (NEEDLE) ×2 IMPLANT
ELECT REM PT RETURN 9FT ADLT (ELECTROSURGICAL)
ELECTRODE REM PT RTRN 9FT ADLT (ELECTROSURGICAL) IMPLANT
GLOVE BIO SURGEON STRL SZ7 (GLOVE) ×2 IMPLANT
GLOVE BIOGEL PI IND STRL 7.0 (GLOVE) ×1 IMPLANT
GLOVE BIOGEL PI INDICATOR 7.0 (GLOVE) ×1
NEEDLE HYPO 22GX1.5 SAFETY (NEEDLE) ×2 IMPLANT
NS IRRIG 1000ML POUR BTL (IV SOLUTION) ×1 IMPLANT
PACK VAGINAL MINOR WOMEN LF (CUSTOM PROCEDURE TRAY) ×2 IMPLANT
PAD PREP 24X48 CUFFED NSTRL (MISCELLANEOUS) ×2 IMPLANT
PEN SKIN MARKING STD PT W/RULR (MISCELLANEOUS) ×2 IMPLANT
PENCIL BUTTON HOLSTER BLD 10FT (ELECTRODE) ×1 IMPLANT
SUT VICRYL RAPIDE 3-0 36IN (SUTURE) ×1 IMPLANT
SYR 20CC LL (SYRINGE) ×1 IMPLANT
TOWEL OR 17X24 6PK STRL BLUE (TOWEL DISPOSABLE) ×4 IMPLANT
TUBING NON-CON 1/4 X 20 CONN (TUBING) ×1 IMPLANT
WATER STERILE IRR 1000ML POUR (IV SOLUTION) ×2 IMPLANT
YANKAUER SUCT BULB TIP NO VENT (SUCTIONS) ×1 IMPLANT

## 2015-09-18 NOTE — Anesthesia Procedure Notes (Signed)
Procedure Name: LMA Insertion Date/Time: 09/18/2015 12:23 PM Performed by: Georgeanne Nim Pre-anesthesia Checklist: Patient identified, Patient being monitored, Timeout performed, Emergency Drugs available and Suction available Patient Re-evaluated:Patient Re-evaluated prior to inductionOxygen Delivery Method: Circle system utilized Preoxygenation: Pre-oxygenation with 100% oxygen Intubation Type: IV induction Ventilation: Mask ventilation without difficulty LMA: LMA with gastric port inserted LMA Size: 4.0 Number of attempts: 1 Placement Confirmation: positive ETCO2,  CO2 detector and breath sounds checked- equal and bilateral Tube secured with: Tape Dental Injury: Teeth and Oropharynx as per pre-operative assessment

## 2015-09-18 NOTE — Transfer of Care (Signed)
Immediate Anesthesia Transfer of Care Note  Patient: [name]  Procedure(s) Performed: Procedure(s): WIDE EXCISION VULVECTOMY (N/A)  Patient Location: PACU  Anesthesia Type:General  Level of Consciousness: awake, alert , oriented and patient cooperative  Airway & Oxygen Therapy: Patient Spontanous Breathing and Patient connected to nasal cannula oxygen  Post-op Assessment: Report given to RN and Post -op Vital signs reviewed and stable  Post vital signs: Reviewed and stable  Last Vitals:  Filed Vitals:   09/18/15 1018  BP: 141/75  Pulse: 64  Temp: 36.7 C  Resp: 18    Complications: No apparent anesthesia complications

## 2015-09-18 NOTE — H&P (View-Only) (Signed)
[name] is an 55 y.o. female.   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 She was seen for her annual exam in late 2016 and noted to have an area on her left labia that was concerning for recurrent VIN3. Biopsy confirmed VIN3. Management options were reviewed and she wishes to proceed with WLE of the vulva.    Menstrual History: Postmenopausal No LMP recorded. Patient is postmenopausal.    Past Medical History  Diagnosis Date  . GERD (gastroesophageal reflux disease)   . Mitral valve prolapse   . Scoliosis     Past Surgical History  Procedure Laterality Date  . Cholecystectomy    . Total hip arthroplasty    . Spinal fixation surgery    . Neck surgery    . Open heart surgery    . Leg surgery      dicron artery  . Co2 laser application N/A [id number]    Procedure: CO2 LASER APPLICATION;  Surgeon: Gus Height, MD;  Location: [facility];  Service: Gynecology;  Laterality: N/A;    Family History  Problem Relation Age of Onset  . Diabetes    . Heart disease      Social History:  reports that she quit smoking about 13 years ago. Her smoking use included Cigarettes. She has never used smokeless tobacco. She reports that she does not drink alcohol or use illicit drugs.  Allergies: No Known Allergies   (Not in a hospital admission)  ROS: as above  There were no vitals taken for this visit. Physical Exam   AOX3 NAD 2mm area on Left labia majora c/w Vin 3  No results found for this or any previous visit (from the past 24 hour(s)).  No results found.  Assessment/Plan: 1) Admit 2) SCDs to OR 3) Proceed with WLE  Kraven Calk H. 08/30/2015, 10:52 AM

## 2015-09-18 NOTE — Anesthesia Postprocedure Evaluation (Signed)
Anesthesia Post Note  Patient: [name]  Procedure(s) Performed: Procedure(s) (LRB): WIDE EXCISION VULVECTOMY (N/A)  Patient location during evaluation: PACU Anesthesia Type: General Level of consciousness: awake and alert Pain management: pain level controlled Vital Signs Assessment: post-procedure vital signs reviewed and stable Respiratory status: spontaneous breathing, nonlabored ventilation, respiratory function stable and patient connected to nasal cannula oxygen Cardiovascular status: blood pressure returned to baseline and stable Postop Assessment: no signs of nausea or vomiting Anesthetic complications: no    Last Vitals:  Filed Vitals:   09/18/15 1415 09/18/15 1419  BP:  126/68  Pulse: 82   Temp: 37.1 C   Resp: 15     Last Pain: There were no vitals filed for this visit.               Treyden Hakim

## 2015-09-18 NOTE — Op Note (Signed)
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  Postoperative Diagnosis: Same Procedure: Wide local excision of left labia Surgeon: Dr. Vanessa Kick Assistant: none Operative Findings: 5 mm circular erythematous area on posterior right labia majora. Specimen: WLE of left vulva EBL: minimal  Procedure: [name] is a 55 year old Caucasian female with a history of full for intraepithelial neoplasia usual type , formally VIN 3. She has previously been treated with CO2 laser of the vulva During her annual exam this year there was a area noted on her left labia majora that was concerning for recurrent disease Biopsy confirmed recurrent VIN usual type. Given recurrent disease was unifocal the decision was made to proceed with wide local excision of the vulva. Risks/benefits/alternatives of the procedure were discussed at length with the patient and she wished to proceed. Following the appropriate informed consent the patient was brought to the operating room wishes placed in the dorsolithotomy position in [place]. She was appropriately identified during a preoperative timeout procedure. The perineum was prepped and draped in normal sterile fashion. A surgical marker was used to make a verticle ellipse surrounding the 5 mm lesion. The vulvar skin was then infiltrated with 1% lidocaine with 1 100,000 epinephrine. The scalpel was then used to excise the ellipse of vulvar tissue. Needle-nosed cautery was used to achieve hemostasis. He vulvar and his skin was then closed with 3-0 Vicryl he'd in a running fashion This completed the operative procedure. All sponge, lap, needle counts correct were correct. Anesthesia was reversed in the operating room and the patient was transferred to the postanesthesia care unit in stable condition following the procedure

## 2015-09-18 NOTE — Discharge Instructions (Signed)

## 2015-09-18 NOTE — Anesthesia Preprocedure Evaluation (Signed)
Anesthesia Evaluation  Patient identified by MRN, date of birth, ID band Patient awake    Reviewed: Allergy & Precautions, NPO status , Patient's Chart, lab work & pertinent test results  Airway Mallampati: II   Neck ROM: full    Dental   Pulmonary former smoker,    breath sounds clear to auscultation       Cardiovascular negative cardio ROS   Rhythm:regular Rate:Normal     Neuro/Psych    GI/Hepatic GERD  ,  Endo/Other  Morbid obesity  Renal/GU      Musculoskeletal  (+) Arthritis ,   Abdominal   Peds  Hematology   Anesthesia Other Findings   Reproductive/Obstetrics                             Anesthesia Physical Anesthesia Plan  ASA: II  Anesthesia Plan: General   Post-op Pain Management:    Induction: Intravenous  Airway Management Planned: LMA  Additional Equipment:   Intra-op Plan:   Post-operative Plan:   Informed Consent: I have reviewed the patients History and Physical, chart, labs and discussed the procedure including the risks, benefits and alternatives for the proposed anesthesia with the patient or authorized representative who has indicated his/her understanding and acceptance.     Plan Discussed with: CRNA, Anesthesiologist and Surgeon  Anesthesia Plan Comments:         Anesthesia Quick Evaluation

## 2015-09-18 NOTE — Interval H&P Note (Signed)
History and Physical Interval Note:  No changes in [place] since last office visit  09/18/2015 12:07 PM  [name]  has presented today for surgery, with the diagnosis of VIN 3  The various methods of treatment have been discussed with the patient and family. After consideration of risks, benefits and other options for treatment, the patient has consented to  Procedure(s): WIDE EXCISION VULVECTOMY (N/A) as a surgical intervention .  The patient's history has been reviewed, patient examined, no change in status, stable for surgery.  I have reviewed the patient's chart and labs.  Questions were answered to the patient's satisfaction.     Rachel Raine H.

## 2015-09-19 ENCOUNTER — Encounter (HOSPITAL_COMMUNITY): Payer: Self-pay | Admitting: Obstetrics and Gynecology

## 2016-08-03 ENCOUNTER — Other Ambulatory Visit (HOSPITAL_COMMUNITY): Payer: Self-pay | Admitting: Obstetrics and Gynecology

## 2016-08-03 DIAGNOSIS — Z1231 Encounter for screening mammogram for malignant neoplasm of breast: Secondary | ICD-10-CM

## 2016-08-14 ENCOUNTER — Other Ambulatory Visit: Payer: Self-pay | Admitting: Neurosurgery

## 2016-08-14 DIAGNOSIS — M5416 Radiculopathy, lumbar region: Secondary | ICD-10-CM

## 2016-08-24 ENCOUNTER — Ambulatory Visit (HOSPITAL_COMMUNITY)
Admission: RE | Admit: 2016-08-24 | Discharge: 2016-08-24 | Disposition: A | Discharge status: Home / Self Care | Payer: PRIVATE HEALTH INSURANCE | Source: Ambulatory Visit | Attending: Obstetrics and Gynecology | Admitting: Obstetrics and Gynecology

## 2016-08-24 ENCOUNTER — Other Ambulatory Visit (HOSPITAL_COMMUNITY): Payer: Self-pay | Admitting: Obstetrics and Gynecology

## 2016-08-24 ENCOUNTER — Ambulatory Visit (HOSPITAL_COMMUNITY): Payer: PRIVATE HEALTH INSURANCE

## 2016-08-24 DIAGNOSIS — Z1231 Encounter for screening mammogram for malignant neoplasm of breast: Secondary | ICD-10-CM | POA: Insufficient documentation

## 2016-08-28 ENCOUNTER — Ambulatory Visit
Admission: RE | Admit: 2016-08-28 | Discharge: 2016-08-28 | Disposition: A | Discharge status: Home / Self Care | Payer: PRIVATE HEALTH INSURANCE | Source: Ambulatory Visit | Attending: Neurosurgery | Admitting: Neurosurgery

## 2016-08-28 DIAGNOSIS — M5416 Radiculopathy, lumbar region: Secondary | ICD-10-CM

## 2016-08-28 MED ORDER — MEPERIDINE HCL 100 MG/ML IJ SOLN
75.0000 mg | Freq: Once | INTRAMUSCULAR | Status: AC
  Administered 2016-08-28: 75 mg via INTRAMUSCULAR

## 2016-08-28 MED ORDER — ONDANSETRON HCL 4 MG/2ML IJ SOLN
4.0000 mg | Freq: Once | INTRAMUSCULAR | Status: AC
  Administered 2016-08-28: 4 mg via INTRAMUSCULAR

## 2016-08-28 MED ORDER — ONDANSETRON HCL 4 MG/2ML IJ SOLN: 4.0000 mg | Freq: Four times a day (QID) | INTRAMUSCULAR | Status: DC | PRN

## 2016-08-28 MED ORDER — IOPAMIDOL (ISOVUE-M 200) INJECTION 41%
15.0000 mL | Freq: Once | INTRAMUSCULAR | Status: AC
  Administered 2016-08-28: 15 mL via INTRATHECAL

## 2016-08-28 MED ORDER — DIAZEPAM 5 MG PO TABS
10.0000 mg | ORAL_TABLET | Freq: Once | ORAL | Status: AC
  Administered 2016-08-28: 10 mg via ORAL

## 2016-08-28 NOTE — Discharge Instructions (Signed)
Myelogram Discharge Instructions  1. Go home and rest quietly for the next 24 hours.  It is important to lie flat for the next 24 hours.  Get up only to go to the restroom.  You may lie in the bed or on a couch on your back, your stomach, your left side or your right side.  You may have one pillow under your head.  You may have pillows between your knees while you are on your side or under your knees while you are on your back.  2. DO NOT drive today.  Recline the seat as far back as it will go, while still wearing your seat belt, on the way home.  3. You may get up to go to the bathroom as needed.  You may sit up for 10 minutes to eat.  You may resume your normal diet and medications unless otherwise indicated.  Drink plenty of extra fluids today and tomorrow.  4. The incidence of a spinal headache with nausea and/or vomiting is about 5% (one in 20 patients).  If you develop a headache, lie flat and drink plenty of fluids until the headache goes away.  Caffeinated beverages may be helpful.  If you develop severe nausea and vomiting or a headache that does not go away with flat bed rest, call [phone].  5. You may resume normal activities after your 24 hours of bed rest is over; however, do not exert yourself strongly or do any heavy lifting tomorrow.  6. Call your physician for a follow-up appointment.    You may resume Tramadol on Saturday, August 29, 2016.

## 2016-08-28 NOTE — Progress Notes (Signed)
Patient states she has been off Tramadol for at least the past days.  jkl

## 2016-09-18 ENCOUNTER — Other Ambulatory Visit: Payer: Self-pay | Admitting: Neurosurgery

## 2016-10-12 ENCOUNTER — Encounter (HOSPITAL_COMMUNITY): Payer: Self-pay

## 2016-10-12 ENCOUNTER — Encounter (HOSPITAL_COMMUNITY)
Admission: RE | Admit: 2016-10-12 | Discharge: 2016-10-12 | Disposition: A | Discharge status: Home / Self Care | Payer: PRIVATE HEALTH INSURANCE | Source: Ambulatory Visit | Attending: Neurosurgery | Admitting: Neurosurgery

## 2016-10-12 DIAGNOSIS — Z01812 Encounter for preprocedural laboratory examination: Secondary | ICD-10-CM | POA: Diagnosis not present

## 2016-10-12 DIAGNOSIS — Z01810 Encounter for preprocedural cardiovascular examination: Secondary | ICD-10-CM | POA: Insufficient documentation

## 2016-10-12 DIAGNOSIS — Z9889 Other specified postprocedural states: Secondary | ICD-10-CM | POA: Insufficient documentation

## 2016-10-12 HISTORY — DX: Other intervertebral disc degeneration, lumbar region: M51.36

## 2016-10-12 HISTORY — DX: Unspecified osteoarthritis, unspecified site: M19.90

## 2016-10-12 HISTORY — DX: Pneumonia, unspecified organism: J18.9

## 2016-10-12 LAB — BASIC METABOLIC PANEL
Anion gap: 9 (ref 5–15)
BUN: 11 mg/dL (ref 6–20)
CHLORIDE: 103 mmol/L (ref 101–111)
CO2: 27 mmol/L (ref 22–32)
Calcium: 9 mg/dL (ref 8.9–10.3)
Creatinine, Ser: 0.75 mg/dL (ref 0.44–1.00)
GFR calc Af Amer: >60 mL/min (ref >60)
GFR calc non Af Amer: >60 mL/min (ref >60)
GLUCOSE: 110 mg/dL — AB (ref 65–99)
POTASSIUM: 3.8 mmol/L (ref 3.5–5.1)
Sodium: 139 mmol/L (ref 135–145)

## 2016-10-12 LAB — TYPE AND SCREEN
ABO/RH(D): AB POS
Antibody Screen: NEGATIVE

## 2016-10-12 LAB — SURGICAL PCR SCREEN
MRSA, PCR: NEGATIVE
STAPHYLOCOCCUS AUREUS: NEGATIVE

## 2016-10-12 LAB — CBC
HEMATOCRIT: 46.6 % — AB (ref 36.0–46.0)
Hemoglobin: 15.9 g/dL — ABNORMAL HIGH (ref 12.0–15.0)
MCH: 31.3 pg (ref 26.0–34.0)
MCHC: 34.1 g/dL (ref 30.0–36.0)
MCV: 91.7 fL (ref 78.0–100.0)
Platelets: 277 10*3/uL (ref 150–400)
RBC: 5.08 MIL/uL (ref 3.87–5.11)
RDW: 13.2 % (ref 11.5–15.5)
WBC: 7.3 10*3/uL (ref 4.0–10.5)

## 2016-10-12 NOTE — Pre-Procedure Instructions (Addendum)
    [name]  10/12/2016    Your procedure is scheduled on Thursday, February15.  Report to [facility] Admitting at 7:30 AM               Your surgery or procedure is scheduled for 9:30 AM  Call this number if you have problems the morning of surgery: [phone]               For any other questions, please call [phone], Monday - Friday 8 AM - 4 PM.    Remember:  Do not eat food or drink liquids after midnight Wednesday, February 14.  Take these medicines the morning of surgery with A SIP OF WATER: estrogens-methylTEST , medroxyPROGESTERone (PROVERA).                 Take if needed: traMADol [name]), Cimetidine (TAGAMET)               STOP taking Aspirin, Aspirin Products (Goody Powder, Excedrin Migraine), Ibuprofen (Advil), Naproxen (Aleve), Vitamins and Herbal Products (ie Fish Oil)   Do not wear jewelry, make-up or nail polish.  Do not wear lotions, powders, or perfumes, or deodorant.  Do not shave 48 hours prior to surgery.   Do not bring valuables to the hospital.  [facility] is not responsible for any belongings or valuables.  Contacts, dentures or bridgework may not be worn into surgery.  Leave your suitcase in the car.  After surgery it may be brought to your room.  For patients admitted to the hospital, discharge time will be determined by your treatment team.  Special instructions: Review  [place] - Preparing For Surgery.  Please read over the following fact sheets that you were given. [place]- Preparing For Surgery and Patient Instructions for Mupirocin Application,COughing and Deep Breathing, Pain Booklet

## 2016-10-12 NOTE — Progress Notes (Signed)
[name] denies chest pain or shortness of breath.  [patient had a Mitral Valve Prolapse until age [age] when she had it repaired.  Patient was seen by Dr Melvern Banker for years but told that she no longer needed to be followed.

## 2016-10-13 LAB — ABO/RH: ABO/RH(D): AB POS

## 2016-10-22 ENCOUNTER — Encounter (HOSPITAL_COMMUNITY): Payer: Self-pay | Admitting: *Deleted

## 2016-10-22 ENCOUNTER — Encounter (HOSPITAL_COMMUNITY)
Admission: RE | Disposition: A | Discharge status: Home / Self Care | Payer: Self-pay | Source: Ambulatory Visit | Attending: Neurosurgery

## 2016-10-22 ENCOUNTER — Inpatient Hospital Stay (HOSPITAL_COMMUNITY): Payer: PRIVATE HEALTH INSURANCE

## 2016-10-22 ENCOUNTER — Inpatient Hospital Stay (HOSPITAL_COMMUNITY)
Admission: RE | Admit: 2016-10-22 | Discharge: 2016-10-24 | DRG: 460 | Disposition: A | Discharge status: Home / Self Care | Payer: PRIVATE HEALTH INSURANCE | Source: Ambulatory Visit | Attending: Neurosurgery | Admitting: Neurosurgery

## 2016-10-22 ENCOUNTER — Inpatient Hospital Stay (HOSPITAL_COMMUNITY): Payer: PRIVATE HEALTH INSURANCE | Admitting: Critical Care Medicine

## 2016-10-22 DIAGNOSIS — M48061 Spinal stenosis, lumbar region without neurogenic claudication: Secondary | ICD-10-CM | POA: Diagnosis present

## 2016-10-22 DIAGNOSIS — Z79899 Other long term (current) drug therapy: Secondary | ICD-10-CM

## 2016-10-22 DIAGNOSIS — K219 Gastro-esophageal reflux disease without esophagitis: Secondary | ICD-10-CM | POA: Diagnosis present

## 2016-10-22 DIAGNOSIS — M419 Scoliosis, unspecified: Secondary | ICD-10-CM | POA: Diagnosis present

## 2016-10-22 DIAGNOSIS — Z419 Encounter for procedure for purposes other than remedying health state, unspecified: Secondary | ICD-10-CM

## 2016-10-22 DIAGNOSIS — Z96641 Presence of right artificial hip joint: Secondary | ICD-10-CM | POA: Diagnosis present

## 2016-10-22 DIAGNOSIS — Z85828 Personal history of other malignant neoplasm of skin: Secondary | ICD-10-CM | POA: Diagnosis not present

## 2016-10-22 DIAGNOSIS — M4800 Spinal stenosis, site unspecified: Secondary | ICD-10-CM | POA: Diagnosis present

## 2016-10-22 DIAGNOSIS — Z87891 Personal history of nicotine dependence: Secondary | ICD-10-CM

## 2016-10-22 DIAGNOSIS — M5136 Other intervertebral disc degeneration, lumbar region: Secondary | ICD-10-CM | POA: Diagnosis present

## 2016-10-22 HISTORY — PX: ANTERIOR LAT LUMBAR FUSION: SHX1168

## 2016-10-22 SURGERY — ANTERIOR LATERAL LUMBAR FUSION 1 LEVEL
Anesthesia: General | Site: Spine Lumbar | Laterality: Left

## 2016-10-22 MED ORDER — OXYCODONE HCL 5 MG PO TABS
5.0000 mg | ORAL_TABLET | ORAL | Status: DC | PRN
  Administered 2016-10-22 – 2016-10-24 (×7): 10 mg via ORAL
  Administered 2016-10-24 (×2): 5 mg via ORAL
  Administered 2016-10-24: 10 mg via ORAL
  Filled 2016-10-22 (×8): qty 2

## 2016-10-22 MED ORDER — FENTANYL CITRATE (PF) 100 MCG/2ML IJ SOLN
INTRAMUSCULAR | Status: AC
  Filled 2016-10-22: qty 2

## 2016-10-22 MED ORDER — LACTATED RINGERS IV SOLN
INTRAVENOUS | Status: DC
  Administered 2016-10-22 (×2): via INTRAVENOUS

## 2016-10-22 MED ORDER — ACETAMINOPHEN 325 MG PO TABS: 650.0000 mg | ORAL_TABLET | ORAL | Status: DC | PRN

## 2016-10-22 MED ORDER — ALUM & MAG HYDROXIDE-SIMETH 200-200-20 MG/5ML PO SUSP: 30.0000 mL | Freq: Four times a day (QID) | ORAL | Status: DC | PRN

## 2016-10-22 MED ORDER — LIDOCAINE-EPINEPHRINE (PF) 2 %-1:200000 IJ SOLN
INTRAMUSCULAR | Status: DC | PRN
Start: 2016-10-22 — End: 2016-10-22
  Administered 2016-10-22: 5 mL via INTRADERMAL

## 2016-10-22 MED ORDER — CEFAZOLIN SODIUM-DEXTROSE 2-4 GM/100ML-% IV SOLN
2.0000 g | Freq: Three times a day (TID) | INTRAVENOUS | Status: AC
  Administered 2016-10-22 – 2016-10-23 (×2): 2 g via INTRAVENOUS
  Filled 2016-10-22 (×2): qty 100

## 2016-10-22 MED ORDER — LIDOCAINE-EPINEPHRINE (PF) 2 %-1:200000 IJ SOLN
INTRAMUSCULAR | Status: AC
Start: 2016-10-22 — End: 2016-10-22
  Filled 2016-10-22: qty 20

## 2016-10-22 MED ORDER — HYDROMORPHONE HCL 1 MG/ML IJ SOLN
0.2500 mg | INTRAMUSCULAR | Status: DC | PRN
  Administered 2016-10-22 (×4): 0.5 mg via INTRAVENOUS

## 2016-10-22 MED ORDER — DIPHENHYDRAMINE HCL 25 MG PO TABS
25.0000 mg | ORAL_TABLET | Freq: Every day | ORAL | Status: DC
  Filled 2016-10-22: qty 1

## 2016-10-22 MED ORDER — DEXTROSE 5 % IV SOLN: INTRAVENOUS | Status: DC | PRN

## 2016-10-22 MED ORDER — HYDROMORPHONE HCL 1 MG/ML IJ SOLN
INTRAMUSCULAR | Status: AC
  Filled 2016-10-22: qty 0.5

## 2016-10-22 MED ORDER — ARTIFICIAL TEARS OP OINT
TOPICAL_OINTMENT | OPHTHALMIC | Status: DC | PRN
  Administered 2016-10-22: 1 via OPHTHALMIC

## 2016-10-22 MED ORDER — DEXAMETHASONE SODIUM PHOSPHATE 10 MG/ML IJ SOLN
10.0000 mg | Freq: Four times a day (QID) | INTRAMUSCULAR | Status: AC
  Administered 2016-10-23 (×4): 10 mg via INTRAVENOUS
  Filled 2016-10-22 (×4): qty 1

## 2016-10-22 MED ORDER — SODIUM CHLORIDE 0.9 % IV SOLN: 250.0000 mL | INTRAVENOUS | Status: DC

## 2016-10-22 MED ORDER — ONDANSETRON HCL 4 MG/2ML IJ SOLN
4.0000 mg | INTRAMUSCULAR | Status: DC | PRN
  Administered 2016-10-23: 4 mg via INTRAVENOUS
  Filled 2016-10-22: qty 2

## 2016-10-22 MED ORDER — DEXTROSE 5 % IV SOLN
INTRAVENOUS | Status: DC | PRN
  Administered 2016-10-22: 10 ug/min via INTRAVENOUS
  Administered 2016-10-22: 15 ug/min via INTRAVENOUS

## 2016-10-22 MED ORDER — DEXAMETHASONE SODIUM PHOSPHATE 10 MG/ML IJ SOLN
INTRAMUSCULAR | Status: AC
  Filled 2016-10-22: qty 1

## 2016-10-22 MED ORDER — SODIUM CHLORIDE 0.9% FLUSH
3.0000 mL | Freq: Two times a day (BID) | INTRAVENOUS | Status: DC
  Administered 2016-10-22 – 2016-10-24 (×2): 3 mL via INTRAVENOUS

## 2016-10-22 MED ORDER — HYDROMORPHONE HCL 1 MG/ML IJ SOLN
INTRAMUSCULAR | Status: AC
Start: 2016-10-22 — End: 2016-10-22
  Administered 2016-10-22: 0.5 mg via INTRAVENOUS
  Filled 2016-10-22: qty 1

## 2016-10-22 MED ORDER — LIDOCAINE 2% (20 MG/ML) 5 ML SYRINGE
INTRAMUSCULAR | Status: DC | PRN
  Administered 2016-10-22: 40 mg via INTRAVENOUS

## 2016-10-22 MED ORDER — PROPOFOL 500 MG/50ML IV EMUL
INTRAVENOUS | Status: DC | PRN
  Administered 2016-10-22: 50 ug/kg/min via INTRAVENOUS

## 2016-10-22 MED ORDER — FAMOTIDINE 20 MG PO TABS
20.0000 mg | ORAL_TABLET | Freq: Two times a day (BID) | ORAL | Status: DC
  Administered 2016-10-22 – 2016-10-24 (×4): 20 mg via ORAL
  Filled 2016-10-22 (×4): qty 1

## 2016-10-22 MED ORDER — BUPIVACAINE-EPINEPHRINE (PF) 0.5% -1:200000 IJ SOLN
INTRAMUSCULAR | Status: AC
  Filled 2016-10-22: qty 30

## 2016-10-22 MED ORDER — METHOCARBAMOL 1000 MG/10ML IJ SOLN
1000.0000 mg | Freq: Four times a day (QID) | INTRAMUSCULAR | Status: DC
  Administered 2016-10-22 – 2016-10-24 (×7): 1000 mg via INTRAVENOUS
  Filled 2016-10-22 (×11): qty 10

## 2016-10-22 MED ORDER — MIDAZOLAM HCL 2 MG/2ML IJ SOLN
0.5000 mg | Freq: Once | INTRAMUSCULAR | Status: AC | PRN
  Administered 2016-10-22: 2 mg via INTRAVENOUS

## 2016-10-22 MED ORDER — MIDAZOLAM HCL 5 MG/5ML IJ SOLN
INTRAMUSCULAR | Status: DC | PRN
  Administered 2016-10-22: 2 mg via INTRAVENOUS

## 2016-10-22 MED ORDER — BUPIVACAINE HCL (PF) 0.25 % IJ SOLN
INTRAMUSCULAR | Status: AC
  Filled 2016-10-22: qty 30

## 2016-10-22 MED ORDER — ACETAMINOPHEN 650 MG RE SUPP: 650.0000 mg | RECTAL | Status: DC | PRN

## 2016-10-22 MED ORDER — DIPHENHYDRAMINE HCL (SLEEP) 50 MG PO CAPS: 50.0000 mg | ORAL_CAPSULE | Freq: Every evening | ORAL | Status: DC | PRN

## 2016-10-22 MED ORDER — B-12 1000 MCG PO CAPS: 1000.0000 ug | ORAL_CAPSULE | Freq: Every day | ORAL | Status: DC

## 2016-10-22 MED ORDER — CEFAZOLIN SODIUM-DEXTROSE 2-4 GM/100ML-% IV SOLN
INTRAVENOUS | Status: AC
  Filled 2016-10-22: qty 100

## 2016-10-22 MED ORDER — CHLORHEXIDINE GLUCONATE CLOTH 2 % EX PADS: 6.0000 | MEDICATED_PAD | Freq: Once | CUTANEOUS | Status: DC

## 2016-10-22 MED ORDER — MIDAZOLAM HCL 2 MG/2ML IJ SOLN
INTRAMUSCULAR | Status: AC
  Filled 2016-10-22: qty 2

## 2016-10-22 MED ORDER — ACETAMINOPHEN 325 MG PO TABS: 650.0000 mg | ORAL_TABLET | Freq: Four times a day (QID) | ORAL | Status: DC | PRN

## 2016-10-22 MED ORDER — TEMAZEPAM 15 MG PO CAPS
15.0000 mg | ORAL_CAPSULE | Freq: Every evening | ORAL | Status: DC | PRN
  Administered 2016-10-23: 15 mg via ORAL
  Filled 2016-10-22: qty 1

## 2016-10-22 MED ORDER — EST ESTROGENS-METHYLTEST 1.25-2.5 MG PO TABS
1.0000 | ORAL_TABLET | Freq: Every day | ORAL | Status: DC
  Administered 2016-10-23 – 2016-10-24 (×2): 1 via ORAL
  Filled 2016-10-22 (×2): qty 1

## 2016-10-22 MED ORDER — FENTANYL CITRATE (PF) 100 MCG/2ML IJ SOLN
INTRAMUSCULAR | Status: DC | PRN
  Administered 2016-10-22 (×10): 50 ug via INTRAVENOUS

## 2016-10-22 MED ORDER — 0.9 % SODIUM CHLORIDE (POUR BTL) OPTIME
TOPICAL | Status: DC | PRN
  Administered 2016-10-22: 1000 mL

## 2016-10-22 MED ORDER — CEFAZOLIN SODIUM-DEXTROSE 2-4 GM/100ML-% IV SOLN
2.0000 g | INTRAVENOUS | Status: AC
  Administered 2016-10-22: 2 g via INTRAVENOUS

## 2016-10-22 MED ORDER — MEPERIDINE HCL 25 MG/ML IJ SOLN: 6.2500 mg | INTRAMUSCULAR | Status: DC | PRN

## 2016-10-22 MED ORDER — PANTOPRAZOLE SODIUM 40 MG IV SOLR
40.0000 mg | Freq: Every day | INTRAVENOUS | Status: DC
  Administered 2016-10-22 – 2016-10-23 (×2): 40 mg via INTRAVENOUS
  Filled 2016-10-22 (×2): qty 40

## 2016-10-22 MED ORDER — DIPHENHYDRAMINE HCL 25 MG PO CAPS
25.0000 mg | ORAL_CAPSULE | Freq: Every day | ORAL | Status: DC
  Administered 2016-10-22 – 2016-10-23 (×2): 25 mg via ORAL
  Filled 2016-10-22 (×2): qty 1

## 2016-10-22 MED ORDER — OXYCODONE HCL 5 MG PO TABS
ORAL_TABLET | ORAL | Status: AC
  Filled 2016-10-22: qty 2

## 2016-10-22 MED ORDER — TRAMADOL HCL 50 MG PO TABS
100.0000 mg | ORAL_TABLET | Freq: Two times a day (BID) | ORAL | Status: DC
  Administered 2016-10-22 – 2016-10-24 (×4): 100 mg via ORAL
  Filled 2016-10-22 (×4): qty 2

## 2016-10-22 MED ORDER — PHENOL 1.4 % MT LIQD: 1.0000 | OROMUCOSAL | Status: DC | PRN

## 2016-10-22 MED ORDER — THROMBIN 20000 UNITS EX SOLR
CUTANEOUS | Status: AC
  Filled 2016-10-22: qty 20000

## 2016-10-22 MED ORDER — ONDANSETRON HCL 4 MG/2ML IJ SOLN
INTRAMUSCULAR | Status: DC | PRN
  Administered 2016-10-22: 4 mg via INTRAVENOUS

## 2016-10-22 MED ORDER — FENTANYL CITRATE (PF) 100 MCG/2ML IJ SOLN
INTRAMUSCULAR | Status: AC
  Filled 2016-10-22: qty 4

## 2016-10-22 MED ORDER — MENTHOL 3 MG MT LOZG
1.0000 | LOZENGE | OROMUCOSAL | Status: DC | PRN
Start: 2016-10-22 — End: 2016-10-24

## 2016-10-22 MED ORDER — MEDROXYPROGESTERONE ACETATE 2.5 MG PO TABS
2.5000 mg | ORAL_TABLET | Freq: Every day | ORAL | Status: DC
  Administered 2016-10-23: 2.5 mg via ORAL
  Filled 2016-10-22 (×2): qty 1

## 2016-10-22 MED ORDER — THROMBIN 5000 UNITS EX SOLR
CUTANEOUS | Status: AC
  Filled 2016-10-22: qty 5000

## 2016-10-22 MED ORDER — SUCCINYLCHOLINE CHLORIDE 200 MG/10ML IV SOSY
PREFILLED_SYRINGE | INTRAVENOUS | Status: DC | PRN
  Administered 2016-10-22: 120 mg via INTRAVENOUS

## 2016-10-22 MED ORDER — SODIUM CHLORIDE 0.9% FLUSH: 3.0000 mL | INTRAVENOUS | Status: DC | PRN

## 2016-10-22 MED ORDER — DEXAMETHASONE SODIUM PHOSPHATE 10 MG/ML IJ SOLN
10.0000 mg | INTRAMUSCULAR | Status: AC
  Administered 2016-10-22: 10 mg via INTRAVENOUS

## 2016-10-22 MED ORDER — ACETAMINOPHEN 650 MG RE SUPP: 650.0000 mg | Freq: Four times a day (QID) | RECTAL | Status: DC | PRN

## 2016-10-22 MED ORDER — HYDROMORPHONE HCL 1 MG/ML IJ SOLN
INTRAMUSCULAR | Status: AC
  Filled 2016-10-22: qty 1

## 2016-10-22 MED ORDER — SODIUM CHLORIDE 0.9 % IR SOLN
Status: DC | PRN
  Administered 2016-10-22: 13:00:00

## 2016-10-22 MED ORDER — HYDROCODONE-ACETAMINOPHEN 5-325 MG PO TABS: 2.0000 | ORAL_TABLET | Freq: Every day | ORAL | Status: DC | PRN

## 2016-10-22 MED ORDER — HYDROMORPHONE HCL 1 MG/ML IJ SOLN
INTRAMUSCULAR | Status: AC
  Administered 2016-10-22: 0.5 mg via INTRAVENOUS
  Filled 2016-10-22: qty 0.5

## 2016-10-22 MED ORDER — CYCLOBENZAPRINE HCL 10 MG PO TABS
10.0000 mg | ORAL_TABLET | Freq: Three times a day (TID) | ORAL | Status: DC | PRN
  Administered 2016-10-22 – 2016-10-24 (×2): 10 mg via ORAL
  Filled 2016-10-22 (×2): qty 1

## 2016-10-22 MED ORDER — BUPIVACAINE HCL (PF) 0.25 % IJ SOLN
INTRAMUSCULAR | Status: DC | PRN
  Administered 2016-10-22: 5 mL

## 2016-10-22 MED ORDER — PROPOFOL 10 MG/ML IV BOLUS
INTRAVENOUS | Status: AC
  Filled 2016-10-22: qty 20

## 2016-10-22 MED ORDER — EPHEDRINE SULFATE-NACL 50-0.9 MG/10ML-% IV SOSY
PREFILLED_SYRINGE | INTRAVENOUS | Status: DC | PRN
  Administered 2016-10-22 (×4): 5 mg via INTRAVENOUS

## 2016-10-22 MED ORDER — HYDROMORPHONE HCL 1 MG/ML IJ SOLN
1.0000 mg | INTRAMUSCULAR | Status: DC | PRN
  Administered 2016-10-22 – 2016-10-23 (×10): 1 mg via INTRAVENOUS
  Filled 2016-10-22 (×9): qty 1

## 2016-10-22 MED ORDER — PROMETHAZINE HCL 25 MG/ML IJ SOLN: 6.2500 mg | INTRAMUSCULAR | Status: DC | PRN

## 2016-10-22 MED ORDER — PROPOFOL 10 MG/ML IV BOLUS
INTRAVENOUS | Status: DC | PRN
  Administered 2016-10-22: 200 mg via INTRAVENOUS
  Administered 2016-10-22: 10 mg via INTRAVENOUS
  Administered 2016-10-22: 20 mg via INTRAVENOUS
  Administered 2016-10-22: 10 mg via INTRAVENOUS
  Administered 2016-10-22: 20 mg via INTRAVENOUS

## 2016-10-22 SURGICAL SUPPLY — 67 items
ADH SKN CLS APL DERMABOND .7 (GAUZE/BANDAGES/DRESSINGS) ×4
APL SKNCLS STERI-STRIP NONHPOA (GAUZE/BANDAGES/DRESSINGS) ×4
BAG DECANTER FOR FLEXI CONT (MISCELLANEOUS) ×4 IMPLANT
BENZOIN TINCTURE PRP APPL 2/3 (GAUZE/BANDAGES/DRESSINGS) ×6 IMPLANT
BLADE CLIPPER SURG (BLADE) IMPLANT
BOLT DECADE 5.5X40 (Bolt) ×4 IMPLANT
BOLT PLATE DECADE 5.0X40M (Bolt) ×4 IMPLANT
BONE MATRIX OSTEOCEL PRO MED (Bone Implant) ×2 IMPLANT
CARTRIDGE OIL MAESTRO DRILL (MISCELLANEOUS) ×2 IMPLANT
CONT SPEC 4OZ CLIKSEAL STRL BL (MISCELLANEOUS) ×3 IMPLANT
COROENT XL WIDE LORDO 10X22X45 (Intraocular Lens) ×2 IMPLANT
COVER BACK TABLE 24X17X13 BIG (DRAPES) ×2 IMPLANT
COVER BACK TABLE 60X90IN (DRAPES) ×3 IMPLANT
DERMABOND ADVANCED (GAUZE/BANDAGES/DRESSINGS) ×2
DERMABOND ADVANCED .7 DNX12 (GAUZE/BANDAGES/DRESSINGS) ×4 IMPLANT
DIFFUSER DRILL AIR PNEUMATIC (MISCELLANEOUS) ×2 IMPLANT
DRAPE C-ARM 42X72 X-RAY (DRAPES) ×4 IMPLANT
DRAPE C-ARMOR (DRAPES) ×4 IMPLANT
DRAPE LAPAROTOMY 100X72X124 (DRAPES) ×4 IMPLANT
DRAPE POUCH INSTRU U-SHP 10X18 (DRAPES) ×3 IMPLANT
DRAPE SURG 17X23 STRL (DRAPES) ×9 IMPLANT
DRSG OPSITE POSTOP 4X6 (GAUZE/BANDAGES/DRESSINGS) ×4 IMPLANT
ELECT BLADE 4.0 EZ CLEAN MEGAD (MISCELLANEOUS) ×3
ELECT REM PT RETURN 9FT ADLT (ELECTROSURGICAL) ×3
ELECTRODE BLDE 4.0 EZ CLN MEGD (MISCELLANEOUS) ×1 IMPLANT
ELECTRODE REM PT RTRN 9FT ADLT (ELECTROSURGICAL) ×3 IMPLANT
EVACUATOR 1/8 PVC DRAIN (DRAIN) IMPLANT
GAUZE SPONGE 4X4 12PLY STRL (GAUZE/BANDAGES/DRESSINGS) ×3 IMPLANT
GAUZE SPONGE 4X4 16PLY XRAY LF (GAUZE/BANDAGES/DRESSINGS) IMPLANT
GLOVE BIO SURGEON STRL SZ8 (GLOVE) ×6 IMPLANT
GLOVE EXAM NITRILE LRG STRL (GLOVE) IMPLANT
GLOVE EXAM NITRILE XL STR (GLOVE) IMPLANT
GLOVE EXAM NITRILE XS STR PU (GLOVE) IMPLANT
GLOVE INDICATOR 8.5 STRL (GLOVE) ×4 IMPLANT
GOWN STRL REUS W/ TWL LRG LVL3 (GOWN DISPOSABLE) ×2 IMPLANT
GOWN STRL REUS W/ TWL XL LVL3 (GOWN DISPOSABLE) ×4 IMPLANT
GOWN STRL REUS W/TWL 2XL LVL3 (GOWN DISPOSABLE) IMPLANT
GOWN STRL REUS W/TWL LRG LVL3 (GOWN DISPOSABLE) ×3
GOWN STRL REUS W/TWL XL LVL3 (GOWN DISPOSABLE) ×6
KIT BASIN OR (CUSTOM PROCEDURE TRAY) ×4 IMPLANT
KIT DILATOR XLIF 5 (KITS) ×1 IMPLANT
KIT INFUSE XX SMALL 0.7CC (Orthopedic Implant) ×2 IMPLANT
KIT ROOM TURNOVER OR (KITS) ×4 IMPLANT
KIT SURGICAL ACCESS MAXCESS 4 (KITS) ×2 IMPLANT
KIT XLIF (KITS) ×1
MODULE NVM5 NEXT GEN EMG (NEEDLE) ×2 IMPLANT
NDL HYPO 25X1 1.5 SAFETY (NEEDLE) ×1 IMPLANT
NEEDLE HYPO 22GX1.5 SAFETY (NEEDLE) ×3 IMPLANT
NEEDLE HYPO 25X1 1.5 SAFETY (NEEDLE) ×3 IMPLANT
NS IRRIG 1000ML POUR BTL (IV SOLUTION) ×4 IMPLANT
OIL CARTRIDGE MAESTRO DRILL (MISCELLANEOUS)
PACK LAMINECTOMY NEURO (CUSTOM PROCEDURE TRAY) ×4 IMPLANT
PLATE 4H DECADE SPINAL (Plate) ×2 IMPLANT
SPONGE LAP 4X18 X RAY DECT (DISPOSABLE) IMPLANT
SPONGE SURGIFOAM ABS GEL SZ50 (HEMOSTASIS) IMPLANT
STAPLER SKIN PROX WIDE 3.9 (STAPLE) ×3 IMPLANT
STRIP CLOSURE SKIN 1/2X4 (GAUZE/BANDAGES/DRESSINGS) ×6 IMPLANT
SUT VIC AB 0 CT1 18XCR BRD8 (SUTURE) ×2 IMPLANT
SUT VIC AB 0 CT1 8-18 (SUTURE) ×3
SUT VIC AB 2-0 CT1 18 (SUTURE) ×6 IMPLANT
SUT VIC AB 4-0 PS2 27 (SUTURE) ×3 IMPLANT
SUT VICRYL 4-0 PS2 18IN ABS (SUTURE) ×4 IMPLANT
SWABSTICK BENZOIN STERILE (MISCELLANEOUS) ×5 IMPLANT
TOWEL OR 17X24 6PK STRL BLUE (TOWEL DISPOSABLE) ×2 IMPLANT
TOWEL OR 17X26 10 PK STRL BLUE (TOWEL DISPOSABLE) ×2 IMPLANT
TRAY FOLEY W/METER SILVER 16FR (SET/KITS/TRAYS/PACK) ×3 IMPLANT
WATER STERILE IRR 1000ML POUR (IV SOLUTION) ×4 IMPLANT

## 2016-10-22 NOTE — Op Note (Signed)
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 EBL less than 50  History of present illness patient is very pleasant 56 year female side long same back and bilateral leg pain workup revealed progressive degenerative disc disease bone-on-bone listhesis spinal stenosis at L2-3. As well as degenerative scoliosis. Due to patient's failure conservative treatment imaging findings and progressive clinical syndrome I recommended anterior lateral interbody fusion at L2-3. I extensively went over the risks and benefits of the operation the patient as well as perioperative course expectations of outcome and alternatives of surgery and she understood and agreed to proceed forward.  Operative procedure: Patient brought into the or was induced under general anesthesia was positioned in the lateral decubitus position left side up right side down utilizing fluoroscopy the L2-3 disc space was identified and patient was positioned orthogonal to fluoroscopy. She was then taped in place. Then utilizing a 2 incision technique identified through the posterior incision the transverse process with the retroperitoneum away and used my finger to guide a dilator down to one was confirmed by fluoroscopy to be the L2-3 disc space. I then anchored K wire in place. Neuro monitoring was utilized along the dilator tract to confirm safe zone entry I then sequentially dilated inserted the retractor. Again.neural monitoring was used the step along the way through sequential dilation as well as with the  retractor. Then fluoroscopy confirmed good positioning of the retractor I placed the shim ultimately I repositioned the retractor repositioned the Schram sham as it was directed slightly are entry point was slightly posterior. There was never any monitoring that showed me any numbers less than 20. Then with adequate positioning of the retractor and sham I incised the disc space cleaned out the disc space with pituitary rongeurs scraped the endplates with Cobbs rasps and curettes. Then utilizing dilators and paddles I opened up the disc space and significantly reduce the scoliotic deformity. I selected a 10 mm tall 22 mm width and 45 mm length cage packed with the ostia cell probe and a EMP and inserted this under slides and under fluoroscopy. This significantly reduce the scoliotic deformity opened up the disc space to virtual anatomic height. Then after this been achieved I selected a lateral plate 10 mm edition this under fluoroscopy used also to create a hole and placed at 245 mm screws anteriorly and then place the 2 posterior screws and locked in place. Fluoroscopy confirmed good position of all the implants was copiously irrigated meticulous in space was maintained the retractor was collapsed and removed under direct visualization and then the wounds were closed with after Vicryl's and a running 4-0 subcuticular stitch. Dermabond and sterile dressings were applied patient recovered in stable condition. At the end of case all needle counts sponge cuts were correct.

## 2016-10-22 NOTE — Anesthesia Preprocedure Evaluation (Signed)
Anesthesia Evaluation  Patient identified by MRN, date of birth, ID band Patient awake    Reviewed: Allergy & Precautions, NPO status , Patient's Chart, lab work & pertinent test results  Airway Mallampati: II  TM Distance: >3 FB     Dental  (+) Teeth Intact, Dental Advisory Given   Pulmonary former smoker,           Cardiovascular + Valvular Problems/Murmurs MVP      Neuro/Psych    GI/Hepatic GERD  Medicated,  Endo/Other  Morbid obesity  Renal/GU      Musculoskeletal  (+) Arthritis ,   Abdominal   Peds  Hematology   Anesthesia Other Findings   Reproductive/Obstetrics                             Anesthesia Physical Anesthesia Plan  ASA: II  Anesthesia Plan: General   Post-op Pain Management:    Induction: Intravenous  Airway Management Planned: Oral ETT  Additional Equipment:   Intra-op Plan:   Post-operative Plan: Extubation in OR  Informed Consent: I have reviewed the patients History and Physical, chart, labs and discussed the procedure including the risks, benefits and alternatives for the proposed anesthesia with the patient or authorized representative who has indicated his/her understanding and acceptance.   Dental advisory given  Plan Discussed with: Anesthesiologist and Surgeon  Anesthesia Plan Comments:         Anesthesia Quick Evaluation

## 2016-10-22 NOTE — Transfer of Care (Signed)
Immediate Anesthesia Transfer of Care Note  Patient: [name]  Procedure(s) Performed: Procedure(s) with comments: Anterior Lateral Lumbar Fusion - Lumbar two - lumbar three, with Lateral plate (Left) - Anterior Lateral Lumbar Fusion - Lumbar two - lumbar three, with Lateral plate  Patient Location: PACU  Anesthesia Type:General  Level of Consciousness: awake and alert   Airway & Oxygen Therapy: Patient Spontanous Breathing and Patient connected to nasal cannula oxygen  Post-op Assessment: Report given to RN, Post -op Vital signs reviewed and stable and Patient moving all extremities  Post vital signs: Reviewed and stable  Last Vitals:  Vitals:   10/22/16 1116  BP: (!) 153/81  Pulse: 69  Resp: 20  Temp: 37.2 C    Last Pain:  Vitals:   10/22/16 1116  TempSrc: Oral         Complications: No apparent anesthesia complications

## 2016-10-22 NOTE — Anesthesia Preprocedure Evaluation (Addendum)
Anesthesia Evaluation  Patient identified by MRN, date of birth, ID band Patient awake    Reviewed: Allergy & Precautions, NPO status , Patient's Chart, lab work & pertinent test results  History of Anesthesia Complications Negative for: history of anesthetic complications  Airway Mallampati: II  TM Distance: >3 FB Neck ROM: Full    Dental  (+) Dental Advisory Given, Missing   Pulmonary COPD, former smoker (quit '04),    breath sounds clear to auscultation       Cardiovascular (-) angina+ Valvular Problems/Murmurs (as a teenager) MVP  Rhythm:Regular Rate:Normal  ASD or VSD closure age 56, no sequelae   Neuro/Psych DDD with chronic back pain    GI/Hepatic Neg liver ROS, GERD  Medicated and Controlled,  Endo/Other  Morbid obesity  Renal/GU negative Renal ROS     Musculoskeletal  (+) Arthritis , scoliosis   Abdominal (+) + obese,   Peds  Hematology negative hematology ROS (+)   Anesthesia Other Findings   Reproductive/Obstetrics                            Anesthesia Physical Anesthesia Plan  ASA: III  Anesthesia Plan: General   Post-op Pain Management:    Induction: Intravenous  Airway Management Planned: Oral ETT  Additional Equipment:   Intra-op Plan:   Post-operative Plan: Extubation in OR  Informed Consent: I have reviewed the patients History and Physical, chart, labs and discussed the procedure including the risks, benefits and alternatives for the proposed anesthesia with the patient or authorized representative who has indicated his/her understanding and acceptance.   Dental advisory given  Plan Discussed with: CRNA and Surgeon  Anesthesia Plan Comments: (Plan routine monitors, GETA)        Anesthesia Quick Evaluation

## 2016-10-22 NOTE — Anesthesia Postprocedure Evaluation (Signed)
Anesthesia Post Note  Patient: [name]  Procedure(s) Performed: Procedure(s) (LRB): Anterior Lateral Lumbar Fusion - Lumbar two - lumbar three, with Lateral plate (Left)  Patient location during evaluation: PACU Anesthesia Type: General Level of consciousness: awake and alert Pain management: pain level controlled Vital Signs Assessment: post-procedure vital signs reviewed and stable Respiratory status: spontaneous breathing, nonlabored ventilation, respiratory function stable and patient connected to nasal cannula oxygen Cardiovascular status: blood pressure returned to baseline and stable Postop Assessment: no signs of nausea or vomiting Anesthetic complications: no       Last Vitals:  Vitals:   10/22/16 1730 10/22/16 1745  BP: 119/66 119/65  Pulse: 74 82  Resp: 14 17  Temp:      Last Pain:  Vitals:   10/22/16 1715  TempSrc:   PainSc: (P) 10-Worst pain ever                 Jaideep Pollack DAVID

## 2016-10-22 NOTE — Anesthesia Procedure Notes (Addendum)
Procedure Name: Intubation Date/Time: 10/22/2016 1:50 PM Performed by: Merrilyn Puma B Pre-anesthesia Checklist: Patient identified, Emergency Drugs available, Suction available, Patient being monitored and Timeout performed Patient Re-evaluated:Patient Re-evaluated prior to inductionOxygen Delivery Method: Circle system utilized Preoxygenation: Pre-oxygenation with 100% oxygen Intubation Type: IV induction Ventilation: Mask ventilation without difficulty Laryngoscope Size: Mac and 3 Grade View: Grade I Tube type: Oral Tube size: 7.0 mm Number of attempts: 1 Airway Equipment and Method: Stylet Placement Confirmation: ETT inserted through vocal cords under direct vision,  positive ETCO2,  CO2 detector and breath sounds checked- equal and bilateral Secured at: 21 cm Tube secured with: Tape Dental Injury: Teeth and Oropharynx as per pre-operative assessment

## 2016-10-22 NOTE — H&P (Signed)
[name] is an 56 y.o. female.   Chief Complaint: Back and bilateral leg pain HPI: Patient is very pleasant 65 show him as a long-standing back pain radiating down L2 and L3 nerve root pattern. Workup has revealed severe degenerative disc disease with progressive collapse and motor changes essentially bone-on-bone at L2-3. As patient failed all forms of conservative treatment and due to her progression both clinically and with imaging I recommended anterior lateral interbody fusion at L2-3 with a lateral plate. I've extensively gone over the risks and benefits of that operation with her as well as perioperative course expectations of outcome and alternatives of surgery and she understands and agrees to proceed forward.  Past Medical History:  Diagnosis Date  . Arthritis   . Cancer ([place])    Skin on left hand  . DDD (degenerative disc disease), lumbar   . GERD (gastroesophageal reflux disease)    Uses OTC medication as needed  . Mitral valve prolapse   . Pneumonia    many many years ago  . Scoliosis     Past Surgical History:  Procedure Laterality Date  . CHOLECYSTECTOMY    . CO2 LASER APPLICATION N/A [id number]   Procedure: CO2 LASER APPLICATION;  Surgeon: Gus Height, MD;  Location: [facility];  Service: Gynecology;  Laterality: N/A;  . COLONOSCOPY W/ POLYPECTOMY    . LEG SURGERY     dicron artery  . NECK SURGERY     fusion  . open heart surgery     repaired at age 5 years, 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  . SPINAL FIXATION SURGERY     age 54 - sclosis  . TOTAL HIP ARTHROPLASTY Right   . UPPER GASTROINTESTINAL ENDOSCOPY    . VULVECTOMY N/A 09/18/2015   Procedure: WIDE EXCISION VULVECTOMY;  Surgeon: Vanessa Kick, MD;  Location: [facility];  Service: Gynecology;  Laterality: N/A;    Family History  Problem Relation Age of Onset  . Diabetes    . Heart disease     Social History:  reports that she quit smoking about 14 years ago. Her smoking use included Cigarettes. She has a 20.00 pack-year  smoking history. She has never used smokeless tobacco. She reports that she does not drink alcohol or use drugs.  Allergies: No Known Allergies  Medications Prior to Admission  Medication Sig Dispense Refill  . Cimetidine (TAGAMET PO) Take 1 tablet by mouth daily as needed (indigestion).    . Cyanocobalamin (B-12) 1000 MCG CAPS Take 1,000 mcg by mouth daily.    . diphenhydrAMINE (BENADRYL) 25 MG tablet Take 25 mg by mouth at bedtime.    . DiphenhydrAMINE HCl, Sleep, (SLEEP AID) 50 MG CAPS Take 50 mg by mouth at bedtime as needed (sleep).    Marland Kitchen estrogens-methylTEST 1.25-2.5 MG TABS tablet Take 1 tablet by mouth daily.    Marland Kitchen HYDROcodone-acetaminophen (NORCO/VICODIN) 5-325 MG tablet Take 2 tablets by mouth daily as needed for moderate pain.     . medroxyPROGESTERone (PROVERA) 2.5 MG tablet Take 2.5 mg by mouth daily.    . temazepam (RESTORIL) 15 MG capsule Take 15 mg by mouth at bedtime as needed for sleep.    . traMADol (ULTRAM) 50 MG tablet Take 100 mg by mouth 2 (two) times daily.       No results found for this or any previous visit (from the past 48 hour(s)). No results found.  Review of Systems  Constitutional: Negative.   HENT: Negative.   Eyes: Negative.   Respiratory: Negative.  Cardiovascular: Negative.   Gastrointestinal: Negative.   Genitourinary: Negative.   Musculoskeletal: Positive for back pain, myalgias and neck pain.  Skin: Negative.   Neurological: Negative.   Psychiatric/Behavioral: Negative.     Blood pressure (!) 153/81, pulse 69, temperature 99 F (37.2 C), temperature source Oral, resp. rate 20, height 5\' 2"  (1.575 m), weight 96.8 kg (213 lb 8 oz), SpO2 97 %. Physical Exam  Neurological: She is alert. She has normal strength.  Patient is awake alert strength 5 out of 5 iliopsoas, quads, hamstrings, gastric, into tibialis, and EHL.     Assessment/Plan 56 year old female with severe disc disease L2-3. Presents for an anterolateral interbody fusion at  L2-3.  Keawe Marcello P, MD 10/22/2016, 1:43 PM

## 2016-10-22 NOTE — Progress Notes (Signed)
Arrived from PACU. Easily arousal. Alert and oriented. Surgical pain 10/10. Dressing CDI. Husband at bedside.

## 2016-10-23 ENCOUNTER — Encounter (HOSPITAL_COMMUNITY): Payer: Self-pay | Admitting: Neurosurgery

## 2016-10-23 MED ORDER — MEDROXYPROGESTERONE ACETATE 2.5 MG PO TABS
2.5000 mg | ORAL_TABLET | Freq: Every day | ORAL | Status: DC
  Administered 2016-10-24: 2.5 mg via ORAL
  Filled 2016-10-23: qty 1

## 2016-10-23 NOTE — Clinical Social Work Note (Addendum)
CSW consulted for SNF placement for STR. No P/T follow up recommended. RNCM to follow for pt d/c needs. CSW signing off as no further needs identified. Reconsult if needed.  Oretha Ellis, [place], Joice Work [phone]

## 2016-10-23 NOTE — Progress Notes (Signed)
Subjective: Patient reports doing well, no preop radicular pain.  some left thigh pain  Objective: Vital signs in last 24 hours: Temp:  [97.7 F (36.5 C)-98.7 F (37.1 C)] 98.7 F (37.1 C) (02/16 0513) Pulse Rate:  [74-92] 89 (02/16 0513) Resp:  [10-20] 20 (02/16 0513) BP: (109-136)/(54-74) 118/54 (02/16 0513) SpO2:  [93 %-100 %] 94 % (02/16 0513) Weight:  [96.4 kg (212 lb 8.4 oz)] 96.4 kg (212 lb 8.4 oz) (02/15 2024)  Intake/Output from previous day: 02/15 0701 - 02/16 0700 In: 1200 [I.V.:1200] Out: 2775 [Urine:2725; Blood:50] Intake/Output this shift: No intake/output data recorded.  IP weakness 4/5, wound dry  Lab Results: No results for input(s): WBC, HGB, HCT, PLT in the last 72 hours. BMET No results for input(s): NA, K, CL, CO2, GLUCOSE, BUN, CREATININE, CALCIUM in the last 72 hours.  Studies/Results: Dg Lumbar Spine 2-3 Views  Result Date: 10/22/2016 CLINICAL DATA:  Lumbar spine surgery. EXAM: DG C-ARM 61-120 MIN; LUMBAR SPINE - 2-3 VIEW COMPARISON:  Lumbar spine CT myelogram 08/28/2016 FLUOROSCOPY TIME:  C-arm fluoroscopic images were obtained intraoperatively and submitted for post operative interpretation. Please see the performing provider's procedural report for the fluoroscopy time utilized. FINDINGS: Single frontal and lateral intraoperative spot fluoroscopic images of the lumbar spine are provided. Sequelae of interval L2-3 fusion are identified with placement of a lateral plate and screws and interbody implants. IMPRESSION: Intraoperative images during L2-3 fusion. Electronically Signed   By: Logan Bores M.D.   On: 10/22/2016 17:34   Dg C-arm 61-120 Min  Result Date: 10/22/2016 CLINICAL DATA:  Lumbar spine surgery. EXAM: DG C-ARM 61-120 MIN; LUMBAR SPINE - 2-3 VIEW COMPARISON:  Lumbar spine CT myelogram 08/28/2016 FLUOROSCOPY TIME:  C-arm fluoroscopic images were obtained intraoperatively and submitted for post operative interpretation. Please see the performing  provider's procedural report for the fluoroscopy time utilized. FINDINGS: Single frontal and lateral intraoperative spot fluoroscopic images of the lumbar spine are provided. Sequelae of interval L2-3 fusion are identified with placement of a lateral plate and screws and interbody implants. IMPRESSION: Intraoperative images during L2-3 fusion. Electronically Signed   By: Logan Bores M.D.   On: 10/22/2016 17:34    Assessment/Plan:   LOS: 1 day  POD 1 doing well, some thigh pain but improved radicular pain   Eugenia Eldredge P 10/23/2016, 3:34 PM

## 2016-10-23 NOTE — Evaluation (Signed)
Occupational Therapy Evaluation and Discharge Patient Details Name: [name] MRN: [id number] DOB: 1961-02-19 Today's Date: 10/23/2016    History of Present Illness Pt is a 56 y/o female s/p L2-3 fusion. Pt has a previous hx of DDD (lumbar), stenosis, arthritis, scoliosis, and mitral valve prolapse.   Clinical Impression   This 56 yo female admitted and underwent above presents to acute OT with all OT education completed, we will D/C from acute OT.    Follow Up Recommendations  No OT follow up    Equipment Recommendations  None recommended by OT       Precautions / Restrictions Precautions Precautions: Back;Fall Precaution Booklet Issued: Yes (comment) Precaution Comments: Pt stated OT had administered and explained back precaution handout. Pt able to recall 1/3 precautions. Reveiwed back precautions with pt.  Required Braces or Orthoses: Spinal Brace Spinal Brace: Lumbar corset;Applied in supine position;Applied in sitting position Restrictions Weight Bearing Restrictions: No      Mobility Bed Mobility Overal bed mobility: Needs Assistance Bed Mobility: Rolling;Sidelying to Sit Rolling: Min guard Sidelying to sit: Min assist        Transfers Overall transfer level: Needs assistance Equipment used: Rolling walker (2 wheeled) Transfers: Sit to/from Stand Sit to Stand: Min guard            Balance Overall balance assessment: Needs assistance Sitting-balance support: No upper extremity supported;Feet supported Sitting balance-Leahy Scale: Fair Sitting balance - Comments: Pt experiencing pain and required use of BUE to maintain balance.   Standing balance support: Bilateral upper extremity supported Standing balance-Leahy Scale: Poor Standing balance comment: use of RW                            ADL Overall ADL's : Needs assistance/impaired Eating/Feeding: Independent;Sitting   Grooming: Min guard;Standing   Upper Body Bathing: Set  up;Sitting   Lower Body Bathing: Moderate assistance (min guard A sit<>stand)   Upper Body Dressing : Set up;Sitting Upper Body Dressing Details (indicate cue type and reason): including brace Lower Body Dressing: Moderate assistance Lower Body Dressing Details (indicate cue type and reason): minguard A sit <>stand Toilet Transfer: Min guard;Ambulation;RW   Toileting- Water quality scientist and Hygiene: Min guard;Sit to/from stand         General ADL Comments: Educated pt and husband on stepping backwards into shower stall if there is not enough room to step forwards with RW and turn around. Educated on use of wet wipes for back peri care, turning feet out before sit<>stand to toilet to make it easer to sit down and keep back straight. Use of 2 cups for brushing teeth to avoid bending over sink               Pertinent Vitals/Pain Pain Assessment: 0-10 Pain Score: 6  Pain Location: incisional pain Pain Descriptors / Indicators: Operative site guarding;Sore Pain Intervention(s): Limited activity within patient's tolerance;Monitored during session;Premedicated before session     Hand Dominance Right   Extremity/Trunk Assessment Upper Extremity Assessment Upper Extremity Assessment: Overall WFL for tasks assessed     Communication Communication Communication: No difficulties   Cognition Arousal/Alertness: Awake/alert Behavior During Therapy: WFL for tasks assessed/performed Overall Cognitive Status: Within Functional Limits for tasks assessed                                Home Living Family/patient expects to be discharged to::  Private residence Living Arrangements: Spouse/significant other Available Help at Discharge: Family;Available 24 hours/day Type of Home: House Home Access: Stairs to enter [employer] of Steps: 1 (Pt reports one step to enter into house) Entrance Stairs-Rails: None Home Layout: Multi-level;Able to live on main level with  bedroom/bathroom     Bathroom Shower/Tub: Walk-in shower;Curtain   Bathroom Toilet: Handicapped height     Home Equipment: Environmental consultant - 2 wheels;Hand held shower head;Other (comment) (step stool with handle to get into bed)          Prior Functioning/Environment Level of Independence: Independent                 OT Problem List: Pain;Decreased range of motion;Impaired balance (sitting and/or standing);Decreased knowledge of precautions      OT Goals(Current goals can be found in the care plan section) Acute Rehab OT Goals Patient Stated Goal: to return home  OT Frequency:                End of Session Equipment Utilized During Treatment: Rolling walker;Back brace  Activity Tolerance: Patient tolerated treatment well Patient left: in chair;with call bell/phone within reach;with family/visitor present   Time: [id number] OT Time Calculation (min): 36 min Charges:  OT General Charges $OT Visit: 1 Procedure OT Evaluation $OT Eval Moderate Complexity: 1 Procedure OT Treatments $Self Care/Home Management : 8-22 mins  Almon Register [id number] 10/23/2016, 3:26 PM

## 2016-10-23 NOTE — Progress Notes (Signed)
Foley D/C. Ambulated in hallway with walker and brace. Tolerated well. Nauseated afterward. Medicated with zofran.

## 2016-10-23 NOTE — Evaluation (Signed)
Physical Therapy Evaluation Patient Details Name: [name] MRN: [id number] DOB: [birth year] Today's Date: 10/23/2016   History of Present Illness  Pt is a 56 y/o female s/p L2-3 fusion. Pt has a previous hx of DDD (lumbar), stenosis, arthritis, scoliosis, and mitral valve prolapse.  Clinical Impression  Pt admitted secondary to the procedure above causing the limitations mentioned below. Prior to admission, pt was independent with all functional mobility. Upon evaluation, pt experiencing pain which limited independence and distance with gait training and independence with functional tasks. Unable to attempt stair training this session secondary to pain. Pt and RN reports pt received pain medication prior to evaluation.  Currently recommending d/c home with intermittent assist. Pt will not require any follow up PT. Will continue to follow to maximize functional mobility independence.     Follow Up Recommendations No PT follow up;Supervision - Intermittent    Equipment Recommendations  None recommended by PT    Recommendations for Other Services       Precautions / Restrictions Precautions Precautions: Back;Fall Precaution Booklet Issued: Yes (comment) Precaution Comments: Pt stated OT had administered and explained back precaution handout. Pt able to recall 1/3 precautions. Reveiwed back precautions with pt.  Required Braces or Orthoses: Spinal Brace Restrictions Weight Bearing Restrictions: No      Mobility  Bed Mobility               General bed mobility comments: Pt in chair upon entry. Reviewed log roll technique with pt.   Transfers Overall transfer level: Needs assistance Equipment used: Rolling walker (2 wheeled) Transfers: Sit to/from Stand Sit to Stand: Min guard;Min assist         General transfer comment: Pt experiencing pain during transfer and required min guard to min A for steadying when performing transfer. Verbal cues for appropriate hand  placement during transfer.   Ambulation/Gait Ambulation/Gait assistance: Min guard Ambulation Distance (Feet): 100 Feet Assistive device: Rolling walker (2 wheeled) Gait Pattern/deviations: Decreased stride length;Step-through pattern;Narrow base of support Gait velocity: Decreased Gait velocity interpretation: Below normal speed for age/gender General Gait Details: Pt required min guard A secondary to decreased balance and for safety.   Stairs            Wheelchair Mobility    Modified Rankin (Stroke Patients Only)       Balance Overall balance assessment: Needs assistance Sitting-balance support: Bilateral upper extremity supported;Feet supported Sitting balance-Leahy Scale: Poor Sitting balance - Comments: Pt experiencing pain and required use of BUE to maintain balance.   Standing balance support: Bilateral upper extremity supported (using RW) Standing balance-Leahy Scale: Poor Standing balance comment: Secondary to pain and generalized weakness, pt required use of BUE to maintain static balance.                              Pertinent Vitals/Pain Pain Assessment: 0-10 Pain Score: 8  Pain Location: incisional pain Pain Descriptors / Indicators: Operative site guarding;Sore Pain Intervention(s): Limited activity within patient's tolerance;Monitored during session;Premedicated before session;Repositioned (Pt and RN report pt received meds before session.)    Home Living Family/patient expects to be discharged to:: Private residence Living Arrangements: Spouse/significant other Available Help at Discharge: Family;Available 24 hours/day Type of Home: House Home Access: Stairs to enter Entrance Stairs-Rails: None Entrance Stairs-Number of Steps: 1 (Pt reports one step to enter into house) Home Layout: Multi-level;Able to live on main level with bedroom/bathroom Home Equipment: Gilford Rile - 2 wheels;Hand  held shower head (step stool to get into bed)       Prior Function Level of Independence: Independent               Hand Dominance   Dominant Hand: Right    Extremity/Trunk Assessment   Upper Extremity Assessment Upper Extremity Assessment: Defer to OT evaluation    Lower Extremity Assessment Lower Extremity Assessment: Generalized weakness (Consistent with previous level of function prior to surgery.)       Communication   Communication: No difficulties  Cognition Arousal/Alertness: Awake/alert Behavior During Therapy: WFL for tasks assessed/performed Overall Cognitive Status: Within Functional Limits for tasks assessed                      General Comments General comments (skin integrity, edema, etc.): Reviewed walking exercise program with pt. Education provided about outdoor surfaces to avoid when walking. Education with demonstration provided about how to perform car transfer.     Exercises     Assessment/Plan    PT Assessment Patient needs continued PT services  PT Problem List Decreased strength;Decreased activity tolerance;Decreased balance;Decreased mobility;Decreased knowledge of use of DME;Decreased knowledge of precautions;Pain          PT Treatment Interventions DME instruction;Stair training;Gait training;Functional mobility training;Therapeutic activities;Therapeutic exercise;Neuromuscular re-education;Patient/family education    PT Goals (Current goals can be found in the Care Plan section)  Acute Rehab PT Goals Patient Stated Goal: to return home PT Goal Formulation: With patient Time For Goal Achievement: 10/30/16 Potential to Achieve Goals: Good    Frequency Min 5X/week   Barriers to discharge        Co-evaluation               End of Session Equipment Utilized During Treatment: Gait belt;Back brace Activity Tolerance: Patient limited by pain Patient left: in chair;with call bell/phone within reach;with chair alarm set Nurse Communication: Mobility status          Time: [id number] PT Time Calculation (min) (ACUTE ONLY): 23 min   Charges:   PT Evaluation $PT Eval Moderate Complexity: 1 Procedure PT Treatments $Gait Training: 8-22 mins   PT G Codes:        Mamie Levers 10/23/2016, 12:48 PM   Nicky Pugh, PT, DPT  Acute Rehabilitation Services  Pager: [phone]

## 2016-10-24 MED ORDER — OXYCODONE HCL 5 MG PO TABS: 5.0000 mg | ORAL_TABLET | ORAL | 0 refills | Status: DC | PRN

## 2016-10-24 MED ORDER — CYCLOBENZAPRINE HCL 10 MG PO TABS: 10.0000 mg | ORAL_TABLET | Freq: Three times a day (TID) | ORAL | 0 refills | Status: DC | PRN

## 2016-10-24 NOTE — Discharge Summary (Signed)
  Physician Discharge Summary  Patient ID: [name] MRN: [id number] DOB/AGE: December 16, 1960 56 y.o.  Admit date: 10/22/2016 Discharge date: 10/24/2016  Admission Diagnoses:Degenerative disease L2-3  Discharge Diagnoses: Same Active Problems:   Spinal stenosis   Discharged Condition: good  Hospital Course: Patient admitted hospital underwent anterior lateral interbody fusion at L2-3 with lateral plate doing very well now postoperatively postop day 2 and relating voiding pain well-controlled significant improvement in left lower extremity weakness stable for discharge home.  Consults: Significant Diagnostic Studies: Treatments: Anterolateral interbody fusion L2-3 Discharge Exam: Blood pressure 108/60, pulse 78, temperature 98 F (36.7 C), temperature source Oral, resp. rate 20, height 5\' 2"  (1.575 m), weight 96.4 kg (212 lb 8.4 oz), SpO2 93 %. 4+ out of 5 left iliopsoas otherwise 5 out of 5  Disposition: Home   Allergies as of 10/24/2016   No Known Allergies     Medication List    TAKE these medications   B-12 1000 MCG Caps Take 1,000 mcg by mouth daily.   cyclobenzaprine 10 MG tablet Commonly known as:  FLEXERIL Take 1 tablet (10 mg total) by mouth 3 (three) times daily as needed for muscle spasms.   diphenhydrAMINE 25 MG tablet Commonly known as:  BENADRYL Take 25 mg by mouth at bedtime.   estrogens-methylTEST 1.25-2.5 MG Tabs tablet Take 1 tablet by mouth daily.   HYDROcodone-acetaminophen 5-325 MG tablet Commonly known as:  NORCO/VICODIN Take 2 tablets by mouth daily as needed for moderate pain.   medroxyPROGESTERone 2.5 MG tablet Commonly known as:  PROVERA Take 2.5 mg by mouth daily.   oxyCODONE 5 MG immediate release tablet Commonly known as:  Oxy IR/ROXICODONE Take 1-2 tablets (5-10 mg total) by mouth every 3 (three) hours as needed for breakthrough pain.   SLEEP AID 50 MG Caps Generic drug:  DiphenhydrAMINE HCl (Sleep) Take 50 mg by mouth at  bedtime as needed (sleep).   TAGAMET PO Take 1 tablet by mouth daily as needed (indigestion).   temazepam 15 MG capsule Commonly known as:  RESTORIL Take 15 mg by mouth at bedtime as needed for sleep.   traMADol 50 MG tablet Commonly known as:  ULTRAM Take 100 mg by mouth 2 (two) times daily.      Follow-up Information    Jennet Scroggin P, MD Follow up.   Specialty:  Neurosurgery Contact information: [address] [place] [address] [phone]           Signed: Elaina Hoops 10/24/2016, 8:26 AM

## 2016-10-24 NOTE — Progress Notes (Signed)
Home soon, has Rx for pain med. Husband at bedside.

## 2016-10-24 NOTE — Discharge Instructions (Signed)
No lifting no bending no twisting no driving a riding a car unless she is coming back and forth to see me. Keep incision clean dry and intact.

## 2016-10-24 NOTE — Care Management Note (Signed)
Case Management Note  Patient Details  Name: [name] MRN: [id number] Date of Birth: 1961-02-04  Subjective/Objective:                  L2-3 fusion Action/Plan: Discharge planning Expected Discharge Date:  10/24/16               Expected Discharge Plan:  Home/Self Care  In-House Referral:     Discharge planning Services  CM Consult  Post Acute Care Choice:  NA Choice offered to:  NA  DME Arranged:  N/A DME Agency:  NA  HH Arranged:  NA HH Agency:  NA  Status of Service:  Completed, signed off  If discussed at [place] of Stay Meetings, dates discussed:    Additional Comments: CM notes no home services recc nor orderd.  No other CM needs were communicated. Dellie Catholic, RN 10/24/2016, 9:09 AM

## 2016-10-28 ENCOUNTER — Other Ambulatory Visit (HOSPITAL_COMMUNITY): Payer: Self-pay | Admitting: Neurosurgery

## 2016-10-28 DIAGNOSIS — R609 Edema, unspecified: Secondary | ICD-10-CM

## 2016-10-29 ENCOUNTER — Ambulatory Visit (HOSPITAL_COMMUNITY)
Admission: RE | Admit: 2016-10-29 | Discharge: 2016-10-29 | Disposition: A | Discharge status: Home / Self Care | Payer: PRIVATE HEALTH INSURANCE | Source: Ambulatory Visit | Attending: Internal Medicine | Admitting: Internal Medicine

## 2016-10-29 DIAGNOSIS — R609 Edema, unspecified: Secondary | ICD-10-CM | POA: Insufficient documentation

## 2016-10-29 NOTE — Progress Notes (Signed)
*  Preliminary Results* Bilateral lower extremity venous duplex completed. Bilateral lower extremities are negative for deep vein thrombosis. There is no evidence of Baker's cyst bilaterally.  10/29/2016 8:31 AM Maudry Mayhew, BS, RVT, RDCS, RDMS

## 2017-03-09 ENCOUNTER — Other Ambulatory Visit (HOSPITAL_COMMUNITY): Payer: Self-pay | Admitting: Neurosurgery

## 2017-03-09 DIAGNOSIS — M4807 Spinal stenosis, lumbosacral region: Secondary | ICD-10-CM

## 2017-03-29 ENCOUNTER — Ambulatory Visit (HOSPITAL_COMMUNITY)
Admission: RE | Admit: 2017-03-29 | Discharge: 2017-03-29 | Disposition: A | Discharge status: Home / Self Care | Payer: PRIVATE HEALTH INSURANCE | Source: Ambulatory Visit | Attending: Neurosurgery | Admitting: Neurosurgery

## 2017-03-29 DIAGNOSIS — Z981 Arthrodesis status: Secondary | ICD-10-CM | POA: Insufficient documentation

## 2017-03-29 DIAGNOSIS — M4807 Spinal stenosis, lumbosacral region: Secondary | ICD-10-CM

## 2017-03-29 DIAGNOSIS — M47817 Spondylosis without myelopathy or radiculopathy, lumbosacral region: Secondary | ICD-10-CM | POA: Diagnosis not present

## 2017-03-29 DIAGNOSIS — M47816 Spondylosis without myelopathy or radiculopathy, lumbar region: Secondary | ICD-10-CM | POA: Insufficient documentation

## 2017-05-03 ENCOUNTER — Encounter: Payer: Self-pay | Admitting: Internal Medicine

## 2017-06-07 ENCOUNTER — Ambulatory Visit (AMBULATORY_SURGERY_CENTER): Payer: Self-pay | Admitting: *Deleted

## 2017-06-07 VITALS — Ht 62.0 in | Wt 225.0 lb

## 2017-06-07 DIAGNOSIS — Z86010 Personal history of colonic polyps: Secondary | ICD-10-CM

## 2017-06-07 MED ORDER — NA SULFATE-K SULFATE-MG SULF 17.5-3.13-1.6 GM/177ML PO SOLN: ORAL | 0 refills | Status: DC

## 2017-06-07 NOTE — Progress Notes (Signed)
No allergies to eggs or soy. No problems with anesthesia.  Pt given Emmi instructions for colonoscopy  No oxygen use  No diet drug use  PT TAKES PHENTERMINE; INSTRUCTED TO STOP 10 DAYS BEFORE PROCEDURE

## 2017-06-10 ENCOUNTER — Encounter: Payer: Self-pay | Admitting: Internal Medicine

## 2017-06-21 ENCOUNTER — Ambulatory Visit (AMBULATORY_SURGERY_CENTER): Payer: PRIVATE HEALTH INSURANCE | Admitting: Internal Medicine

## 2017-06-21 ENCOUNTER — Encounter: Payer: Self-pay | Admitting: Internal Medicine

## 2017-06-21 VITALS — BP 109/67 | HR 71 | Temp 99.6°F | Resp 13 | Ht 62.0 in | Wt 225.0 lb

## 2017-06-21 DIAGNOSIS — D123 Benign neoplasm of transverse colon: Secondary | ICD-10-CM

## 2017-06-21 DIAGNOSIS — D122 Benign neoplasm of ascending colon: Secondary | ICD-10-CM

## 2017-06-21 DIAGNOSIS — D126 Benign neoplasm of colon, unspecified: Secondary | ICD-10-CM

## 2017-06-21 DIAGNOSIS — D125 Benign neoplasm of sigmoid colon: Secondary | ICD-10-CM | POA: Diagnosis not present

## 2017-06-21 DIAGNOSIS — Z86010 Personal history of colonic polyps: Secondary | ICD-10-CM | POA: Diagnosis present

## 2017-06-21 DIAGNOSIS — K635 Polyp of colon: Secondary | ICD-10-CM

## 2017-06-21 HISTORY — PX: COLONOSCOPY W/ POLYPECTOMY: SHX1380

## 2017-06-21 MED ORDER — SODIUM CHLORIDE 0.9 % IV SOLN: 500.0000 mL | INTRAVENOUS | Status: DC

## 2017-06-21 NOTE — Progress Notes (Signed)
Report to PACU, RN, vss, BBS= Clear.  

## 2017-06-21 NOTE — Op Note (Signed)
[place] Patient Name: [name] Procedure Date: 06/21/2017 9:00 AM MRN: [id number] Endoscopist: Docia Chuck. Henrene Pastor , MD Age: 56 Referring MD:  Date of Birth: 06/20/1961 Gender: Female Account #: [id number] Procedure:                Colonoscopy, with cold snare polypectomy x 5 Indications:              High risk colon cancer surveillance: Personal                            history of multiple (3 or more) adenomas. Index                            exam November 2013 Medicines:                Monitored Anesthesia Care Procedure:                Pre-Anesthesia Assessment:                           - Prior to the procedure, a History and Physical                            was performed, and patient medications and                            allergies were reviewed. The patient's tolerance of                            previous anesthesia was also reviewed. The risks                            and benefits of the procedure and the sedation                            options and risks were discussed with the patient.                            All questions were answered, and informed consent                            was obtained. Prior Anticoagulants: The patient has                            taken no previous anticoagulant or antiplatelet                            agents. ASA Grade Assessment: II - A patient with                            mild systemic disease. After reviewing the risks                            and benefits, the patient was deemed in  satisfactory condition to undergo the procedure.                           After obtaining informed consent, the colonoscope                            was passed under direct vision. Throughout the                            procedure, the patient's blood pressure, pulse, and                            oxygen saturations were monitored continuously. The                            Colonoscope  was introduced through the anus and                            advanced to the the cecum, identified by                            appendiceal orifice and ileocecal valve. The                            ileocecal valve, appendiceal orifice, and rectum                            were photographed. The quality of the bowel                            preparation was excellent. The colonoscopy was                            performed without difficulty. The patient tolerated                            the procedure well. The bowel preparation used was                            SUPREP. Scope In: 9:17:35 AM Scope Out: 9:38:04 AM Scope Withdrawal Time: 0 hours 16 minutes 1 second  Total Procedure Duration: 0 hours 20 minutes 29 seconds  Findings:                 Five polyps were found in the sigmoid colon,                            transverse colon and ascending colon. The polyps                            were 3 to 5 mm in size. These polyps were removed                            with a cold snare. Resection and retrieval were  complete.                           Multiple diverticula were found in the left colon                            and right colon.                           Internal hemorrhoids were found during retroflexion.                           The exam was otherwise without abnormality on                            direct and retroflexion views. Complications:            No immediate complications. Estimated blood loss:                            None. Estimated Blood Loss:     Estimated blood loss: none. Impression:               - Five 3 to 5 mm polyps in the sigmoid colon, in                            the transverse colon and in the ascending colon,                            removed with a cold snare. Resected and retrieved.                           - Diverticulosis in the left colon and in the right                            colon.                            - Internal hemorrhoids.                           - The examination was otherwise normal on direct                            and retroflexion views. Recommendation:           - Repeat colonoscopy in 3 years for surveillance.                           - Patient has a contact number available for                            emergencies. The signs and symptoms of potential                            delayed complications were discussed with the  patient. Return to normal activities tomorrow.                            Written discharge instructions were provided to the                            patient.                           - Resume previous diet.                           - Continue present medications.                           - Await pathology results. Docia Chuck. Henrene Pastor, MD 06/21/2017 9:45:32 AM This report has been signed electronically.

## 2017-06-21 NOTE — Patient Instructions (Signed)
YOU HAD AN ENDOSCOPIC PROCEDURE TODAY AT [facility]:   Refer to the procedure report that was given to you for any specific questions about what was found during the examination.  If the procedure report does not answer your questions, please call your gastroenterologist to clarify.  If you requested that your care partner not be given the details of your procedure findings, then the procedure report has been included in a sealed envelope for you to review at your convenience later.  YOU SHOULD EXPECT: Some feelings of bloating in the abdomen. Passage of more gas than usual.  Walking can help get rid of the air that was put into your GI tract during the procedure and reduce the bloating. If you had a lower endoscopy (such as a colonoscopy or flexible sigmoidoscopy) you may notice spotting of blood in your stool or on the toilet paper. If you underwent a bowel prep for your procedure, you may not have a normal bowel movement for a few days.  Please Note:  You might notice some irritation and congestion in your nose or some drainage.  This is from the oxygen used during your procedure.  There is no need for concern and it should clear up in a day or so.  SYMPTOMS TO REPORT IMMEDIATELY:   Following lower endoscopy (colonoscopy or flexible sigmoidoscopy):  Excessive amounts of blood in the stool  Significant tenderness or worsening of abdominal pains  Swelling of the abdomen that is new, acute  Fever of 100F or higher   Following upper endoscopy (EGD)  Vomiting of blood or coffee ground material  New chest pain or pain under the shoulder blades  Painful or persistently difficult swallowing  New shortness of breath  Fever of 100F or higher  Black, tarry-looking stools  For urgent or emergent issues, a gastroenterologist can be reached at any hour by calling [phone].   DIET:  We do recommend a small meal at first, but then you may proceed to your regular diet.  Drink  plenty of fluids but you should avoid alcoholic beverages for 24 hours. Try to eat a high fiber diet, and drink plenty of water.  ACTIVITY:  You should plan to take it easy for the rest of today and you should NOT DRIVE or use heavy machinery until tomorrow (because of the sedation medicines used during the test).    FOLLOW UP: Our staff will call the number listed on your records the next business day following your procedure to check on you and address any questions or concerns that you may have regarding the information given to you following your procedure. If we do not reach you, we will leave a message.  However, if you are feeling well and you are not experiencing any problems, there is no need to return our call.  We will assume that you have returned to your regular daily activities without incident.  If any biopsies were taken you will be contacted by phone or by letter within the next 1-3 weeks.  Please call us at [phone] if you have not heard about the biopsies in 3 weeks.    SIGNATURES/CONFIDENTIALITY: You and/or your care partner have signed paperwork which will be entered into your electronic medical record.  These signatures attest to the fact that that the information above on your After Visit Summary has been reviewed and is understood.  Full responsibility of the confidentiality of this discharge information lies with you and/or your care-partner.  Read all of the handouts given to you by your recovery room nurse.

## 2017-06-21 NOTE — Progress Notes (Signed)
Called to room to assist during endoscopic procedure.  Patient ID and intended procedure confirmed with present staff. Received instructions for my participation in the procedure from the performing physician.  

## 2017-06-22 ENCOUNTER — Telehealth: Payer: Self-pay | Admitting: *Deleted

## 2017-06-22 NOTE — Telephone Encounter (Signed)
  Follow up Call-  Call back number 06/21/2017  Post procedure Call Back phone  # [phone]  Permission to leave phone message Yes  Some recent data might be hidden     Patient questions:  Do you have a fever, pain , or abdominal swelling? No. Pain Score  0 *  Have you tolerated food without any problems? Yes.    Have you been able to return to your normal activities? Yes.    Do you have any questions about your discharge instructions: Diet   No. Medications  No. Follow up visit  No.  Do you have questions or concerns about your Care? No.  Actions: * If pain score is 4 or above: No action needed, pain <4.

## 2017-06-24 ENCOUNTER — Encounter: Payer: Self-pay | Admitting: Internal Medicine

## 2017-06-24 ENCOUNTER — Telehealth: Payer: Self-pay | Admitting: Internal Medicine

## 2017-06-24 NOTE — Telephone Encounter (Signed)
Pt states she is sore after her colon that was done Monday. She has not seen any blood in her stool Reports her "gut feels sore." States the discomfort is below her belly button in her LLQ. Pt wants to know if this is to be expected. Pt reports she did have 5 polyps removed.

## 2017-06-24 NOTE — Telephone Encounter (Signed)
Colonoscopy 3 days ago. Should not be having any significant abdominal pain. Typically procedure related pain occurs right after the procedure. If this is minor soreness, observe and give it time. She does have diverticular changes. If this becomes severe, let us know

## 2017-06-24 NOTE — Telephone Encounter (Signed)
The pt has been advised and will call back if the soreness does not improve or worsens

## 2017-08-09 ENCOUNTER — Other Ambulatory Visit (HOSPITAL_COMMUNITY): Payer: Self-pay | Admitting: Obstetrics and Gynecology

## 2017-08-09 DIAGNOSIS — Z1231 Encounter for screening mammogram for malignant neoplasm of breast: Secondary | ICD-10-CM

## 2017-08-10 ENCOUNTER — Encounter (HOSPITAL_COMMUNITY): Payer: Self-pay | Admitting: *Deleted

## 2017-08-10 ENCOUNTER — Other Ambulatory Visit: Payer: Self-pay

## 2017-08-10 ENCOUNTER — Emergency Department (HOSPITAL_COMMUNITY)
Admission: EM | Admit: 2017-08-10 | Discharge: 2017-08-10 | Disposition: A | Discharge status: Home / Self Care | Payer: PRIVATE HEALTH INSURANCE | Attending: Emergency Medicine | Admitting: Emergency Medicine

## 2017-08-10 ENCOUNTER — Emergency Department (HOSPITAL_COMMUNITY): Payer: PRIVATE HEALTH INSURANCE

## 2017-08-10 DIAGNOSIS — W01198A Fall on same level from slipping, tripping and stumbling with subsequent striking against other object, initial encounter: Secondary | ICD-10-CM | POA: Insufficient documentation

## 2017-08-10 DIAGNOSIS — Z8679 Personal history of other diseases of the circulatory system: Secondary | ICD-10-CM | POA: Diagnosis not present

## 2017-08-10 DIAGNOSIS — S62326A Displaced fracture of shaft of fifth metacarpal bone, right hand, initial encounter for closed fracture: Secondary | ICD-10-CM | POA: Diagnosis not present

## 2017-08-10 DIAGNOSIS — Y939 Activity, unspecified: Secondary | ICD-10-CM | POA: Insufficient documentation

## 2017-08-10 DIAGNOSIS — Z87891 Personal history of nicotine dependence: Secondary | ICD-10-CM | POA: Diagnosis not present

## 2017-08-10 DIAGNOSIS — Z79899 Other long term (current) drug therapy: Secondary | ICD-10-CM | POA: Diagnosis not present

## 2017-08-10 DIAGNOSIS — S6991XA Unspecified injury of right wrist, hand and finger(s), initial encounter: Secondary | ICD-10-CM | POA: Diagnosis present

## 2017-08-10 DIAGNOSIS — Y999 Unspecified external cause status: Secondary | ICD-10-CM | POA: Diagnosis not present

## 2017-08-10 DIAGNOSIS — Y92018 Other place in single-family (private) house as the place of occurrence of the external cause: Secondary | ICD-10-CM | POA: Insufficient documentation

## 2017-08-10 MED ORDER — HYDROCODONE-ACETAMINOPHEN 5-325 MG PO TABS: 1.0000 | ORAL_TABLET | ORAL | 0 refills | Status: DC | PRN

## 2017-08-10 MED ORDER — OXYCODONE-ACETAMINOPHEN 5-325 MG PO TABS
1.0000 | ORAL_TABLET | ORAL | Status: DC | PRN
  Administered 2017-08-10: 1 via ORAL
  Filled 2017-08-10: qty 1

## 2017-08-10 MED ORDER — OXYCODONE-ACETAMINOPHEN 5-325 MG PO TABS
1.0000 | ORAL_TABLET | Freq: Once | ORAL | Status: AC
  Administered 2017-08-10: 1 via ORAL
  Filled 2017-08-10: qty 1

## 2017-08-10 MED ORDER — IBUPROFEN 400 MG PO TABS
600.0000 mg | ORAL_TABLET | Freq: Once | ORAL | Status: AC
  Administered 2017-08-10: 600 mg via ORAL
  Filled 2017-08-10: qty 1

## 2017-08-10 NOTE — ED Notes (Signed)
Called ortho tech for splint placement.

## 2017-08-10 NOTE — Progress Notes (Signed)
Orthopedic Tech Progress Note Patient Details:  [name] Jan 13, 1961 [id number]  Ortho Devices Type of Ortho Device: Ace wrap, Ulna gutter splint Ortho Device/Splint Location: rue Ortho Device/Splint Interventions: Application   Post Interventions Patient Tolerated: Well Instructions Provided: Care of device   Hildred Priest 08/10/2017, 11:25 AM

## 2017-08-10 NOTE — ED Provider Notes (Signed)
[facility] EMERGENCY DEPARTMENT Provider Note   CSN: [id number] Arrival date & time: 08/10/17  0806     History   Chief Complaint Chief Complaint  Patient presents with  . Hand Injury    HPI [name] is a 56 y.o. female.  HPI   [name] is a 56 y.o. female, with a history of GERD and mitral valve prolapse, presenting to the ED with a right hand injury that occurred around 9:30PM last night.  States she tripped over her dog and struck the hand at the right fifth MCP joint on the door frame.  Her pain is currently 8/10, throbbing, nonradiating. Also complains of some minor soreness to the dorsal left foot and an abrasion to the anterior right knee. Takes meloxicam daily, but has not had it today.  Last food was to breakfast bars around 7:30 AM this morning.  Denies head injury, LOC, neck/back pain, numbness, weakness, wrist pain, upper arm pain, or any other complaints.  Past Medical History:  Diagnosis Date  . Arthritis   . Cancer ([place])    Skin on left hand  . DDD (degenerative disc disease), lumbar   . GERD (gastroesophageal reflux disease)    Uses OTC medication as needed  . Mitral valve prolapse   . Pneumonia    many many years ago  . Scoliosis     Patient Active Problem List   Diagnosis Date Noted  . Spinal stenosis 10/22/2016  . Hip pain 11/29/2012  . Lumbago 11/29/2012  . Osteoarthritis 05/10/2012  . S/P cholecystectomy 05/10/2012  . GERD (gastroesophageal reflux disease) 05/10/2012    Past Surgical History:  Procedure Laterality Date  . ANTERIOR LAT LUMBAR FUSION Left 10/22/2016   Procedure: Anterior Lateral Lumbar Fusion - Lumbar two - lumbar three, with Lateral plate;  Surgeon: Kary Kos, MD;  Location: [facility];  Service: Neurosurgery;  Laterality: Left;  Anterior Lateral Lumbar Fusion - Lumbar two - lumbar three, with Lateral plate  . CHOLECYSTECTOMY  2009  . CO2 LASER APPLICATION N/A 50/35/4656   Procedure: CO2 LASER  APPLICATION;  Surgeon: Gus Height, MD;  Location: [facility];  Service: Gynecology;  Laterality: N/A;  . COLONOSCOPY W/ POLYPECTOMY    . LEG SURGERY  1983   dicron artery  . NECK SURGERY  2011   fusion  . open heart surgery  1977   repaired at age 49 years, 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  . SPINAL FIXATION SURGERY  1973   age 58 - sclosis  . TOTAL HIP ARTHROPLASTY Right 2011  . UPPER GASTROINTESTINAL ENDOSCOPY    . VULVECTOMY N/A 09/18/2015   Procedure: WIDE EXCISION VULVECTOMY;  Surgeon: Vanessa Kick, MD;  Location: [facility];  Service: Gynecology;  Laterality: N/A;    OB History    No data available       Home Medications    Prior to Admission medications   Medication Sig Start Date End Date Taking? Authorizing Provider  Cimetidine (TAGAMET PO) Take 1 tablet by mouth daily as needed (indigestion).   Yes [provider]  cyclobenzaprine (FLEXERIL) 10 MG tablet Take 1 tablet (10 mg total) by mouth 3 (three) times daily as needed for muscle spasms. 10/24/16  Yes Kary Kos, MD  diphenhydrAMINE (BENADRYL) 25 MG tablet Take 25 mg by mouth at bedtime.   Yes [provider]  estrogens-methylTEST 1.25-2.5 MG TABS tablet Take 1 tablet by mouth daily.   Yes [provider]  medroxyPROGESTERone (PROVERA) 2.5 MG tablet  Take 2.5 mg by mouth daily.   Yes [provider]  temazepam (RESTORIL) 15 MG capsule Take 15 mg by mouth at bedtime as needed for sleep.   Yes [provider]  traMADol (ULTRAM) 50 MG tablet Take 100 mg by mouth 2 (two) times daily as needed for moderate pain.    Yes [provider]  HYDROcodone-acetaminophen (NORCO/VICODIN) 5-325 MG tablet Take 1-2 tablets by mouth every 4 (four) hours as needed for severe pain. 08/10/17   Joy, Helane Gunther, PA-C    Family History Family History  Problem Relation Age of Onset  . Diabetes Unknown   . Heart disease Unknown   . Colon cancer Neg Hx     Social History Social History   Tobacco Use  . Smoking  status: Former Smoker    Packs/day: 1.00    Years: 20.00    Pack years: 20.00    Types: Cigarettes    Last attempt to quit: 09/07/2002    Years since quitting: 14.9  . Smokeless tobacco: Never Used  Substance Use Topics  . Alcohol use: No    Comment: occ  . Drug use: No     Allergies   Patient has no known allergies.   Review of Systems Review of Systems  Gastrointestinal: Negative for nausea and vomiting.  Musculoskeletal: Positive for arthralgias and joint swelling. Negative for back pain and neck pain.  Skin: Positive for color change and wound.  Neurological: Negative for dizziness, weakness, light-headedness, numbness and headaches.     Physical Exam Updated Vital Signs BP 126/62 (BP Location: Left Arm)   Pulse 80   Temp 98.2 F (36.8 C) (Oral)   Resp 17   SpO2 100%   Physical Exam  Constitutional: She appears well-developed and well-nourished. No distress.  HENT:  Head: Normocephalic and atraumatic.  Eyes: Conjunctivae are normal.  Neck: Normal range of motion. Neck supple.  Cardiovascular: Normal rate, regular rhythm and intact distal pulses.  Pulmonary/Chest: Effort normal.  Musculoskeletal: She exhibits edema and tenderness.  Swelling, tenderness, and ecchymosis over the right fifth metacarpal.  Some limited range of motion in the right fifth MCP joint suspected to be due to pain and swelling.  Full range of motion in the right fifth PIP and DIP joints.  No noted scissoring of the fourth and fifth digits. Full range of motion in the other PIP, DIP, and MCP joints of the right hand. Full range of motion without pain through the cardinal directions of the right wrist, elbow, and shoulder. Normal motor function intact in all extremities and spine. No midline spinal tenderness.   Full passive and active range of motion in the right knee without swelling, ecchymosis, crepitus, deformity, or laxity.  Patella appears to be in correct anatomical position.  In full  passive and active range of motion in the left foot and ankle.  No noted instability, deformity, crepitus, or swelling.  Neurological: She is alert.  No noted sensory deficits in the right hand or lower extremities. Abduction and adduction of the right fingers intact against resistance. Strength 5/5 with flexion and extension at the bilateral hips, knees, and ankles. No noted gait deficit.  Skin: Skin is warm and dry. Capillary refill takes less than 2 seconds. She is not diaphoretic. No pallor.  Minor erythema minor tenderness over the dorsal left foot in the area of the third metatarsal.  No pain in this area with ambulation or range of motion of the foot or ankle.  No  noted swelling, ecchymosis, crepitus, or instability.  Small abrasion noted to the anterior right knee.  No active hemorrhage.  No surrounding swelling or ecchymosis.  Psychiatric: She has a normal mood and affect. Her behavior is normal.  Nursing note and vitals reviewed.    ED Treatments / Results  Labs (all labs ordered are listed, but only abnormal results are displayed) Labs Reviewed - No data to display  EKG  EKG Interpretation None       Radiology Dg Hand Complete Right  Result Date: 08/10/2017 CLINICAL DATA:  Fall, swelling and bruising to third through fifth metacarpals EXAM: RIGHT HAND - COMPLETE 3+ VIEW COMPARISON:  None. FINDINGS: There is an oblique fracture through the right fifth metacarpal with proximal translation and displacement. No subluxation or dislocation. IMPRESSION: Displaced oblique right fifth metacarpal fracture. Electronically Signed   By: Rolm Baptise M.D.   On: 08/10/2017 08:55    Procedures .Splint Application Date/Time: 60/03/3709 11:15 AM Performed by: Lorayne Bender, PA-C Authorized by: Lorayne Bender, PA-C   Consent:    Consent obtained:  Verbal   Consent given by:  Patient Pre-procedure details:    Sensation:  Normal   Skin color:  Normal Procedure details:    Laterality:   Right   Location:  Hand   Hand:  R hand   Splint type:  Ulnar gutter   Supplies:  Cotton padding, elastic bandage, Ortho-Glass and sling Post-procedure details:    Pain:  Improved   Sensation:  Normal   Skin color:  Normal   Patient tolerance of procedure:  Tolerated well, no immediate complications Comments:     Procedure was performed by the Ortho Tech with my evaluation before and after. I was available for consultation throughout the procedure.   (including critical care time)  Medications Ordered in ED Medications  oxyCODONE-acetaminophen (PERCOCET/ROXICET) 5-325 MG per tablet 1 tablet (1 tablet Oral Given 08/10/17 0832)  oxyCODONE-acetaminophen (PERCOCET/ROXICET) 5-325 MG per tablet 1 tablet (1 tablet Oral Given 08/10/17 0959)  ibuprofen (ADVIL,MOTRIN) tablet 600 mg (600 mg Oral Given 08/10/17 0959)     Initial Impression / Assessment and Plan / ED Course  I have reviewed the triage vital signs and the nursing notes.  Pertinent labs & imaging results that were available during my care of the patient were reviewed by me and considered in my medical decision making (see chart for details).  Clinical Course as of Aug 11 1127  Tue Aug 10, 2017  1001 Spoke with Silvestre Gunner, PA on call with Hand Surgery. States he will come back to evaluate the patient.  [SJ]  Gleason evaluated patient. States patient can be placed in an ulnar gutter splint and will follow up with Dr. Amedeo Plenty in the office tomorrow morning.  States patient will need pain medication prescription written.  [SJ]    Clinical Course User Index [SJ] Joy, Shawn C, PA-C     Patient presents for evaluation of a right hand injury.  Oblique, displaced right fifth metacarpal fracture noted on x-ray.  Patient splinted without immediate complication.  Follow-up with hand surgery tomorrow. The patient was given instructions for home care as well as return precautions. Patient voices understanding of these  instructions, accepts the plan, and is comfortable with discharge.  Final Clinical Impressions(s) / ED Diagnoses   Final diagnoses:  Closed displaced fracture of shaft of fifth metacarpal bone of right hand, initial encounter    ED Discharge Orders  Ordered    HYDROcodone-acetaminophen (NORCO/VICODIN) 5-325 MG tablet  Every 4 hours PRN,   Status:  Discontinued     08/10/17 1055    HYDROcodone-acetaminophen (NORCO/VICODIN) 5-325 MG tablet  Every 4 hours PRN     08/10/17 1124       Lorayne Bender, PA-C 08/10/17 1128    Daleen Bo, MD 08/10/17 1511

## 2017-08-10 NOTE — Consult Note (Signed)
Reason for Consult:Right 5th [facility] fx Referring Physician: E Marissah Vandemark is an 56 y.o. female.  HPI: [name] got up from bed last night and got tangled in her dog who was sleeping in a different place. She fell forward and caught her right hand on the door jamb. She had immediate pain. They put a temporary splint (sounds like a thumb spica) on last night and sought care this morning. X-rays showed a 5th [facility] fx with some angulation and hand surgery was consulted. She denies N/T. She is RHD.  Past Medical History:  Diagnosis Date  . Arthritis   . Cancer ([place])    Skin on left hand  . DDD (degenerative disc disease), lumbar   . GERD (gastroesophageal reflux disease)    Uses OTC medication as needed  . Mitral valve prolapse   . Pneumonia    many many years ago  . Scoliosis     Past Surgical History:  Procedure Laterality Date  . ANTERIOR LAT LUMBAR FUSION Left 10/22/2016   Procedure: Anterior Lateral Lumbar Fusion - Lumbar two - lumbar three, with Lateral plate;  Surgeon: Kary Kos, MD;  Location: [facility];  Service: Neurosurgery;  Laterality: Left;  Anterior Lateral Lumbar Fusion - Lumbar two - lumbar three, with Lateral plate  . CHOLECYSTECTOMY  2009  . CO2 LASER APPLICATION N/A 76/22/6333   Procedure: CO2 LASER APPLICATION;  Surgeon: Gus Height, MD;  Location: [facility];  Service: Gynecology;  Laterality: N/A;  . COLONOSCOPY W/ POLYPECTOMY    . LEG SURGERY  1983   dicron artery  . NECK SURGERY  2011   fusion  . open heart surgery  1977   repaired at age 77 years, 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  . SPINAL FIXATION SURGERY  1973   age 80 - sclosis  . TOTAL HIP ARTHROPLASTY Right 2011  . UPPER GASTROINTESTINAL ENDOSCOPY    . VULVECTOMY N/A 09/18/2015   Procedure: WIDE EXCISION VULVECTOMY;  Surgeon: Vanessa Kick, MD;  Location: [facility];  Service: Gynecology;  Laterality: N/A;    Family History  Problem Relation Age of Onset  . Diabetes Unknown   . Heart disease Unknown   . Colon cancer Neg Hx      Social History:  reports that she quit smoking about 14 years ago. Her smoking use included cigarettes. She has a 20.00 pack-year smoking history. she has never used smokeless tobacco. She reports that she does not drink alcohol or use drugs.  Allergies: No Known Allergies  Medications: I have reviewed the patient's current medications.  No results found for this or any previous visit (from the past 48 hour(s)).  Dg Hand Complete Right  Result Date: 08/10/2017 CLINICAL DATA:  Fall, swelling and bruising to third through fifth metacarpals EXAM: RIGHT HAND - COMPLETE 3+ VIEW COMPARISON:  None. FINDINGS: There is an oblique fracture through the right fifth metacarpal with proximal translation and displacement. No subluxation or dislocation. IMPRESSION: Displaced oblique right fifth metacarpal fracture. Electronically Signed   By: Rolm Baptise M.D.   On: 08/10/2017 08:55    Review of Systems  Constitutional: Negative for weight loss.  HENT: Negative for ear discharge, ear pain, hearing loss and tinnitus.   Eyes: Negative for blurred vision, double vision, photophobia and pain.  Respiratory: Negative for cough, sputum production and shortness of breath.   Cardiovascular: Negative for chest pain.  Gastrointestinal: Negative for abdominal pain, nausea and vomiting.  Genitourinary: Negative for dysuria, flank pain, frequency and urgency.  Musculoskeletal: Positive for joint pain (Right hand). Negative for back pain, falls, myalgias and neck pain.  Neurological: Negative for dizziness, tingling, sensory change, focal weakness, loss of consciousness and headaches.  Endo/Heme/Allergies: Does not bruise/bleed easily.  Psychiatric/Behavioral: Negative for depression, memory loss and substance abuse. The patient is not nervous/anxious.    Blood pressure 126/62, pulse 80, temperature 98.2 F (36.8 C), temperature source Oral, resp. rate 17, SpO2 100 %. Physical Exam  Constitutional: She appears  well-developed and well-nourished. No distress.  HENT:  Head: Normocephalic.  Eyes: Conjunctivae are normal. Right eye exhibits no discharge. Left eye exhibits no discharge. No scleral icterus.  Neck: Normal range of motion.  Cardiovascular: Normal rate and regular rhythm.  Respiratory: Effort normal. No respiratory distress.  Musculoskeletal:  Right shoulder, elbow, wrist, digits- no skin wounds, ulnar hand severe TTP, no instability, no blocks to motion except pain  Sens  Ax/R/M/U intact  Mot   Ax/ R/ PIN/ M/ AIN/ U intact  Rad 2+   Neurological: She is alert.  Skin: Skin is warm and dry. She is not diaphoretic.  Psychiatric: She has a normal mood and affect. Her behavior is normal.    Assessment/Plan: Right 5th MC fx -- Ulnar gutter splint and NWB. Dr. Amedeo Plenty to see her in office tomorrow at [place], PA-C Orthopedic Surgery [phone] 08/10/2017, 10:44 AM

## 2017-08-10 NOTE — Discharge Instructions (Addendum)
Keep splint intact and dry.  Ice over affected area (over splint) for 30 minutes 4 times a day.  No lifting with hand.   Antiinflammatory medications: Take 600 mg of ibuprofen every 6 hours or 440 mg (over the counter dose) to 500 mg (prescription dose) of naproxen every 12 hours for the next 3 days. After this time, these medications may be used as needed for pain. Take these medications with food to avoid upset stomach. Choose only one of these medications, do not take them together. Tylenol: Should you continue to have additional pain while taking the ibuprofen or naproxen, you may add in tylenol as needed. Your daily total maximum amount of tylenol from all sources should be limited to 4000mg /day for persons without liver problems, or 2000mg /day for those with liver problems. Vicodin: May take Vicodin as needed for severe pain.  Do not drive or perform other dangerous activities while taking the Vicodin.  Please note that each pill of Vicodin contains 325 mg of Tylenol and the above dosage limits apply.  Follow-up: Follow-up with the hand specialist on this matter. Return: Return to the ED for severely increased pain, numbness, or paleness in the fingers.

## 2017-08-10 NOTE — ED Triage Notes (Signed)
Pt reports tripping over her dog last night and falling, has pain and swelling to right  Hand.

## 2017-08-11 ENCOUNTER — Ambulatory Visit: Payer: Self-pay | Admitting: Orthopedic Surgery

## 2017-08-11 ENCOUNTER — Encounter (HOSPITAL_COMMUNITY): Payer: Self-pay | Admitting: *Deleted

## 2017-08-11 ENCOUNTER — Other Ambulatory Visit: Payer: Self-pay

## 2017-08-11 NOTE — Progress Notes (Signed)
Prior to calling pt for pre-op call, I asked Dr. Ermalene Postin, Anesthesiologist about when pt should be NPO. He stated that pt could eat until 8:30 AM on 08/12/17 and then have clear liquids until 12:30 PM. I spoke with pt for pre-op call. Pt has hx of a hole in her heart and she had surgery when she was a teenager. She states she has Mitral Valve prolapse, but has never had any problems with it. She used to see Dr. Melvern Banker but has not seen a cardiologist in years. Denies any chest pain or sob. Pt was given above instructions about eating and drinking prior to surgery.

## 2017-08-12 ENCOUNTER — Ambulatory Visit (HOSPITAL_COMMUNITY)
Admission: RE | Admit: 2017-08-12 | Discharge: 2017-08-12 | Disposition: A | Discharge status: Home / Self Care | Payer: PRIVATE HEALTH INSURANCE | Source: Ambulatory Visit | Attending: Orthopedic Surgery | Admitting: Orthopedic Surgery

## 2017-08-12 ENCOUNTER — Encounter (HOSPITAL_COMMUNITY)
Admission: RE | Disposition: A | Discharge status: Home / Self Care | Payer: Self-pay | Source: Ambulatory Visit | Attending: Orthopedic Surgery

## 2017-08-12 ENCOUNTER — Ambulatory Visit (HOSPITAL_COMMUNITY): Payer: PRIVATE HEALTH INSURANCE | Admitting: Anesthesiology

## 2017-08-12 ENCOUNTER — Encounter (HOSPITAL_COMMUNITY): Payer: Self-pay | Admitting: *Deleted

## 2017-08-12 DIAGNOSIS — S62326A Displaced fracture of shaft of fifth metacarpal bone, right hand, initial encounter for closed fracture: Secondary | ICD-10-CM | POA: Diagnosis not present

## 2017-08-12 DIAGNOSIS — W1809XA Striking against other object with subsequent fall, initial encounter: Secondary | ICD-10-CM | POA: Diagnosis not present

## 2017-08-12 DIAGNOSIS — Z87891 Personal history of nicotine dependence: Secondary | ICD-10-CM | POA: Insufficient documentation

## 2017-08-12 DIAGNOSIS — Y92099 Unspecified place in other non-institutional residence as the place of occurrence of the external cause: Secondary | ICD-10-CM | POA: Diagnosis not present

## 2017-08-12 DIAGNOSIS — M419 Scoliosis, unspecified: Secondary | ICD-10-CM | POA: Diagnosis not present

## 2017-08-12 DIAGNOSIS — K219 Gastro-esophageal reflux disease without esophagitis: Secondary | ICD-10-CM | POA: Diagnosis not present

## 2017-08-12 HISTORY — DX: Nonrheumatic mitral (valve) prolapse: I34.1

## 2017-08-12 HISTORY — PX: OPEN REDUCTION INTERNAL FIXATION (ORIF) METACARPAL: SHX6234

## 2017-08-12 LAB — POCT I-STAT 4, (NA,K, GLUC, HGB,HCT)
Glucose, Bld: 85 mg/dL (ref 65–99)
HEMATOCRIT: 42 % (ref 36.0–46.0)
HEMOGLOBIN: 14.3 g/dL (ref 12.0–15.0)
Potassium: 3.9 mmol/L (ref 3.5–5.1)
SODIUM: 141 mmol/L (ref 135–145)

## 2017-08-12 LAB — SURGICAL PCR SCREEN
MRSA, PCR: NEGATIVE
Staphylococcus aureus: NEGATIVE

## 2017-08-12 SURGERY — OPEN REDUCTION INTERNAL FIXATION (ORIF) METACARPAL
Anesthesia: General | Site: Hand | Laterality: Right

## 2017-08-12 MED ORDER — HYDROMORPHONE HCL 1 MG/ML IJ SOLN: 0.2500 mg | INTRAMUSCULAR | Status: DC | PRN

## 2017-08-12 MED ORDER — CEFAZOLIN SODIUM-DEXTROSE 2-4 GM/100ML-% IV SOLN
INTRAVENOUS | Status: AC
  Filled 2017-08-12: qty 100

## 2017-08-12 MED ORDER — MIDAZOLAM HCL 2 MG/2ML IJ SOLN
INTRAMUSCULAR | Status: DC | PRN
  Administered 2017-08-12: 2 mg via INTRAVENOUS

## 2017-08-12 MED ORDER — FENTANYL CITRATE (PF) 100 MCG/2ML IJ SOLN
50.0000 ug | Freq: Once | INTRAMUSCULAR | Status: AC
  Administered 2017-08-12: 50 ug via INTRAVENOUS

## 2017-08-12 MED ORDER — FENTANYL CITRATE (PF) 100 MCG/2ML IJ SOLN
50.0000 ug | Freq: Once | INTRAMUSCULAR | Status: AC
  Administered 2017-08-12: 75 ug via INTRAVENOUS
  Administered 2017-08-12: 25 ug via INTRAVENOUS

## 2017-08-12 MED ORDER — CEFAZOLIN SODIUM-DEXTROSE 2-4 GM/100ML-% IV SOLN
2.0000 g | INTRAVENOUS | Status: AC
  Administered 2017-08-12: 2 g via INTRAVENOUS

## 2017-08-12 MED ORDER — LACTATED RINGERS IV SOLN
INTRAVENOUS | Status: DC
  Administered 2017-08-12: 18:00:00 via INTRAVENOUS

## 2017-08-12 MED ORDER — PHENYLEPHRINE 40 MCG/ML (10ML) SYRINGE FOR IV PUSH (FOR BLOOD PRESSURE SUPPORT)
PREFILLED_SYRINGE | INTRAVENOUS | Status: DC | PRN
  Administered 2017-08-12: 40 ug via INTRAVENOUS

## 2017-08-12 MED ORDER — MIDAZOLAM HCL 2 MG/2ML IJ SOLN
INTRAMUSCULAR | Status: AC
  Administered 2017-08-12: 1 mg via INTRAVENOUS
  Filled 2017-08-12: qty 2

## 2017-08-12 MED ORDER — LACTATED RINGERS IV SOLN
INTRAVENOUS | Status: DC
  Administered 2017-08-12: 15:00:00 via INTRAVENOUS

## 2017-08-12 MED ORDER — FENTANYL CITRATE (PF) 250 MCG/5ML IJ SOLN
INTRAMUSCULAR | Status: AC
  Filled 2017-08-12: qty 5

## 2017-08-12 MED ORDER — POVIDONE-IODINE 10 % EX SWAB: 2.0000 "application " | Freq: Once | CUTANEOUS | Status: DC

## 2017-08-12 MED ORDER — CHLORHEXIDINE GLUCONATE 4 % EX LIQD: 60.0000 mL | Freq: Once | CUTANEOUS | Status: DC

## 2017-08-12 MED ORDER — MIDAZOLAM HCL 2 MG/2ML IJ SOLN
1.0000 mg | Freq: Once | INTRAMUSCULAR | Status: AC
  Administered 2017-08-12: 1 mg via INTRAVENOUS

## 2017-08-12 MED ORDER — 0.9 % SODIUM CHLORIDE (POUR BTL) OPTIME
TOPICAL | Status: DC | PRN
  Administered 2017-08-12: 1000 mL

## 2017-08-12 MED ORDER — DEXAMETHASONE SODIUM PHOSPHATE 4 MG/ML IJ SOLN
INTRAMUSCULAR | Status: DC | PRN
  Administered 2017-08-12: 8 mg via INTRAVENOUS

## 2017-08-12 MED ORDER — PROPOFOL 500 MG/50ML IV EMUL
INTRAVENOUS | Status: DC | PRN
  Administered 2017-08-12: 30 ug/kg/min via INTRAVENOUS

## 2017-08-12 MED ORDER — HYDROCODONE-ACETAMINOPHEN 5-325 MG PO TABS
2.0000 | ORAL_TABLET | ORAL | 0 refills | Status: DC | PRN
Start: 2017-08-12 — End: 2020-05-29

## 2017-08-12 MED ORDER — OXYCODONE HCL 5 MG/5ML PO SOLN: 5.0000 mg | Freq: Once | ORAL | Status: DC | PRN

## 2017-08-12 MED ORDER — PROMETHAZINE HCL 25 MG/ML IJ SOLN: 6.2500 mg | INTRAMUSCULAR | Status: DC | PRN

## 2017-08-12 MED ORDER — CEPHALEXIN 500 MG PO CAPS: 500.0000 mg | ORAL_CAPSULE | Freq: Four times a day (QID) | ORAL | 0 refills | Status: AC

## 2017-08-12 MED ORDER — OXYCODONE HCL 5 MG PO TABS: 5.0000 mg | ORAL_TABLET | Freq: Once | ORAL | Status: DC | PRN

## 2017-08-12 MED ORDER — FENTANYL CITRATE (PF) 100 MCG/2ML IJ SOLN
INTRAMUSCULAR | Status: AC
  Administered 2017-08-12: 50 ug via INTRAVENOUS
  Filled 2017-08-12: qty 2

## 2017-08-12 MED ORDER — ONDANSETRON HCL 4 MG/2ML IJ SOLN
INTRAMUSCULAR | Status: DC | PRN
  Administered 2017-08-12: 4 mg via INTRAVENOUS

## 2017-08-12 MED ORDER — ROPIVACAINE HCL 5 MG/ML IJ SOLN
INTRAMUSCULAR | Status: DC | PRN
  Administered 2017-08-12: 30 mL via PERINEURAL

## 2017-08-12 MED ORDER — MIDAZOLAM HCL 2 MG/2ML IJ SOLN
INTRAMUSCULAR | Status: AC
  Filled 2017-08-12: qty 2

## 2017-08-12 SURGICAL SUPPLY — 59 items
BANDAGE ACE 3X5.8 VEL STRL LF (GAUZE/BANDAGES/DRESSINGS) ×2 IMPLANT
BANDAGE ACE 4X5 VEL STRL LF (GAUZE/BANDAGES/DRESSINGS) ×2 IMPLANT
BIT DRILL 1.1 MINI (BIT) IMPLANT
BIT DRILL 1.5 (BIT) ×1
BIT DRILL 1.5MM (BIT) IMPLANT
BLADE CLIPPER SURG (BLADE) IMPLANT
BNDG CMPR 9X4 STRL LF SNTH (GAUZE/BANDAGES/DRESSINGS) ×1
BNDG CONFORM 2 STRL LF (GAUZE/BANDAGES/DRESSINGS) ×1 IMPLANT
BNDG CONFORM 3 STRL LF (GAUZE/BANDAGES/DRESSINGS) ×1 IMPLANT
BNDG ESMARK 4X9 LF (GAUZE/BANDAGES/DRESSINGS) ×2 IMPLANT
BNDG GAUZE ELAST 4 BULKY (GAUZE/BANDAGES/DRESSINGS) ×4 IMPLANT
CORDS BIPOLAR (ELECTRODE) ×2 IMPLANT
COVER SURGICAL LIGHT HANDLE (MISCELLANEOUS) ×2 IMPLANT
CUFF TOURNIQUET SINGLE 18IN (TOURNIQUET CUFF) ×2 IMPLANT
CUFF TOURNIQUET SINGLE 24IN (TOURNIQUET CUFF) IMPLANT
DRAIN TLS ROUND 10FR (DRAIN) IMPLANT
DRAPE OEC MINIVIEW 54X84 (DRAPES) IMPLANT
DRAPE SURG 17X23 STRL (DRAPES) ×2 IMPLANT
DRILL BIT 1.1 MINI (BIT) ×2
DRILL BIT 1.5MM (BIT) ×2
DRSG ADAPTIC 3X8 NADH LF (GAUZE/BANDAGES/DRESSINGS) ×1 IMPLANT
GAUZE SPONGE 4X4 12PLY STRL (GAUZE/BANDAGES/DRESSINGS) ×2 IMPLANT
GAUZE XEROFORM 1X8 LF (GAUZE/BANDAGES/DRESSINGS) ×2 IMPLANT
GLOVE BIOGEL M 8.0 STRL (GLOVE) ×2 IMPLANT
GLOVE SS BIOGEL STRL SZ 8 (GLOVE) ×1 IMPLANT
GLOVE SUPERSENSE BIOGEL SZ 8 (GLOVE) ×1
GOWN STRL REUS W/ TWL LRG LVL3 (GOWN DISPOSABLE) ×3 IMPLANT
GOWN STRL REUS W/ TWL XL LVL3 (GOWN DISPOSABLE) ×3 IMPLANT
GOWN STRL REUS W/TWL LRG LVL3 (GOWN DISPOSABLE) ×6
GOWN STRL REUS W/TWL XL LVL3 (GOWN DISPOSABLE) ×6
KIT BASIN OR (CUSTOM PROCEDURE TRAY) ×2 IMPLANT
KIT ROOM TURNOVER OR (KITS) ×2 IMPLANT
MANIFOLD NEPTUNE II (INSTRUMENTS) ×2 IMPLANT
NEEDLE 22X1 1/2 (OR ONLY) (NEEDLE) IMPLANT
NS IRRIG 1000ML POUR BTL (IV SOLUTION) ×2 IMPLANT
PACK ORTHO EXTREMITY (CUSTOM PROCEDURE TRAY) ×2 IMPLANT
PAD ARMBOARD 7.5X6 YLW CONV (MISCELLANEOUS) ×4 IMPLANT
PAD CAST 3X4 CTTN HI CHSV (CAST SUPPLIES) ×1 IMPLANT
PAD CAST 4YDX4 CTTN HI CHSV (CAST SUPPLIES) ×1 IMPLANT
PADDING CAST COTTON 3X4 STRL (CAST SUPPLIES) ×2
PADDING CAST COTTON 4X4 STRL (CAST SUPPLIES) ×2
SCREW 1.5X12MM (Screw) ×1 IMPLANT
SCREW CORT TI ST 2.0X9 (Screw) ×1 IMPLANT
SCRUB BETADINE 4OZ XXX (MISCELLANEOUS) ×2 IMPLANT
SOL PREP POV-IOD 4OZ 10% (MISCELLANEOUS) ×2 IMPLANT
SPLINT FIBERGLASS 3X12 (CAST SUPPLIES) ×1 IMPLANT
SPONGE LAP 4X18 X RAY DECT (DISPOSABLE) IMPLANT
SUT MNCRL AB 4-0 PS2 18 (SUTURE) ×2 IMPLANT
SUT PROLENE 3 0 PS 2 (SUTURE) IMPLANT
SUT PROLENE 4 0 PS 2 18 (SUTURE) ×1 IMPLANT
SUT VIC AB 3-0 FS2 27 (SUTURE) IMPLANT
SUT VIC AB 4-0 PS2 27 (SUTURE) ×1 IMPLANT
SYR CONTROL 10ML LL (SYRINGE) IMPLANT
SYSTEM CHEST DRAIN TLS 7FR (DRAIN) IMPLANT
TOWEL OR 17X24 6PK STRL BLUE (TOWEL DISPOSABLE) ×2 IMPLANT
TOWEL OR 17X26 10 PK STRL BLUE (TOWEL DISPOSABLE) ×2 IMPLANT
TUBE CONNECTING 12X1/4 (SUCTIONS) ×2 IMPLANT
TUBE EVACUATION TLS (MISCELLANEOUS) ×2 IMPLANT
WATER STERILE IRR 1000ML POUR (IV SOLUTION) ×2 IMPLANT

## 2017-08-12 NOTE — Anesthesia Procedure Notes (Signed)
Anesthesia Regional Block: Supraclavicular block   Pre-Anesthetic Checklist: ,, timeout performed, Correct Patient, Correct Site, Correct Laterality, Correct Procedure, Correct Position, site marked, Risks and benefits discussed,  Surgical consent,  Pre-op evaluation,  At surgeon's request and post-op pain management  Laterality: Right  Prep: chloraprep       Needles:   Needle Type: Echogenic Stimulator Needle     Needle Length: 9cm  Needle Gauge: 21     Additional Needles:   Procedures:, nerve stimulator,,,,,,,   Nerve Stimulator or Paresthesia:  Response: 0.4 mA,   Additional Responses:   Narrative:  Start time: 08/12/2017 4:35 PM End time: 08/12/2017 4:45 PM Injection made incrementally with aspirations every 5 mL.  Performed by: Personally  Anesthesiologist: Lillia Abed, MD  Additional Notes: Monitors applied. Patient sedated. Sterile prep and drape,hand hygiene and sterile gloves were used. Relevant anatomy identified.Needle position confirmed.Local anesthetic injected incrementally after negative aspiration. Local anesthetic spread visualized around nerve(s). Vascular puncture avoided. No complications. Image printed for medical record.The patient tolerated the procedure well.

## 2017-08-12 NOTE — Progress Notes (Signed)
Went over discharge info with pt and husben.

## 2017-08-12 NOTE — Anesthesia Preprocedure Evaluation (Addendum)
Anesthesia Evaluation  Patient identified by MRN, date of birth, ID band Patient awake    Reviewed: Allergy & Precautions, NPO status , Patient's Chart, lab work & pertinent test results  History of Anesthesia Complications Negative for: history of anesthetic complications  Airway Mallampati: II  TM Distance: >3 FB Neck ROM: Full    Dental  (+) Dental Advisory Given, Missing   Pulmonary former smoker,    breath sounds clear to auscultation       Cardiovascular (-) angina+ Valvular Problems/Murmurs (as a teenager) MVP  Rhythm:Regular Rate:Normal  ECG: NSR, rate 74  ASD or VSD closure age 24, no sequelae  Mitral valve prolapse   Neuro/Psych negative neurological ROS     GI/Hepatic Neg liver ROS, GERD (diet-related)  Controlled,  Endo/Other  negative endocrine ROS  Renal/GU negative Renal ROS     Musculoskeletal  (+) Arthritis , Scoliosis Chronic low back pain   Abdominal (+) + obese,   Peds  Hematology negative hematology ROS (+)   Anesthesia Other Findings   Reproductive/Obstetrics                            Anesthesia Physical  Anesthesia Plan  ASA: II  Anesthesia Plan: Regional and MAC   Post-op Pain Management:    Induction: Intravenous  PONV Risk Score and Plan: 3 and Ondansetron, Dexamethasone and Midazolam  Airway Management Planned: Simple Face Mask  Additional Equipment:   Intra-op Plan:   Post-operative Plan:   Informed Consent: I have reviewed the patients History and Physical, chart, labs and discussed the procedure including the risks, benefits and alternatives for the proposed anesthesia with the patient or authorized representative who has indicated his/her understanding and acceptance.   Dental advisory given  Plan Discussed with: CRNA and Surgeon  Anesthesia Plan Comments:      Anesthesia Quick Evaluation

## 2017-08-12 NOTE — Discharge Instructions (Signed)

## 2017-08-12 NOTE — Anesthesia Postprocedure Evaluation (Signed)
Anesthesia Post Note  Patient: [name]  Procedure(s) Performed: OPEN REDUCTION INTERNAL FIXATION (ORIF) 5th METACARPAL fracture (Right Hand)     Patient location during evaluation: PACU Anesthesia Type: General Level of consciousness: awake Pain management: pain level controlled Respiratory status: spontaneous breathing Cardiovascular status: stable Anesthetic complications: no    Last Vitals:  Vitals:   08/12/17 1655 08/12/17 1921  BP: 129/75 129/68  Pulse: 72 71  Resp: 18 13  Temp:  36.6 C  SpO2: 99% 100%    Last Pain:  Vitals:   08/12/17 1921  PainSc: 0-No pain                 Adanna Zuckerman

## 2017-08-12 NOTE — Op Note (Signed)
See [id number] Amedeo Plenty MD

## 2017-08-12 NOTE — H&P (Signed)
Presents for 5th right [facility] ORIF  Patient presents for evaluation and treatment of the of their upper extremity predicament. The patient denies neck, back, chest or  abdominal pain. The patient notes that they have no lower extremity problems. The patients primary complaint is noted. We are planning surgical care pathway for the upper extremity.  The patient is alert and oriented in no acute distress. The patient complains of pain in the affected upper extremity.  The patient is noted to have a normal HEENT exam. Lung fields show equal chest expansion and no shortness of breath. Abdomen exam is nontender without distention. Lower extremity examination does not show any fracture dislocation or blood clot symptoms. Pelvis is stable and the neck and back are stable and nontender.  Displaced 5th metacarpal fracture  We are planning surgery for your upper extremity. The risk and benefits of surgery to include risk of bleeding, infection, anesthesia,  damage to normal structures and failure of the surgery to accomplish its intended goals of relieving symptoms and restoring function have been discussed in detail. With this in mind we plan to proceed. I have specifically discussed with the patient the pre-and postoperative regime and the dos and don'ts and risk and benefits in great detail. Risk and benefits of surgery also include risk of dystrophy(CRPS), chronic nerve pain, failure of the healing process to go onto completion and other inherent risks of surgery The relavent the pathophysiology of the disease/injury process, as well as the alternatives for treatment and postoperative course of action has been discussed in great detail with the patient who desires to proceed.  We will do everything in our power to help you (the patient) restore function to the upper extremity. It is a pleasure to see this patient today. Kole Hilyard MD

## 2017-08-12 NOTE — Transfer of Care (Signed)
Immediate Anesthesia Transfer of Care Note  Patient: [name]  Procedure(s) Performed: OPEN REDUCTION INTERNAL FIXATION (ORIF) 5th METACARPAL fracture (Right Hand)  Patient Location: PACU  Anesthesia Type:MAC combined with regional for post-op pain  Level of Consciousness: awake, alert  and oriented  Airway & Oxygen Therapy: Patient Spontanous Breathing  Post-op Assessment: Report given to RN and Post -op Vital signs reviewed and stable  Post vital signs: Reviewed and stable  Last Vitals:  Vitals:   08/12/17 1650 08/12/17 1655  BP: 127/69 129/75  Pulse: 74 72  Resp: 20 18  SpO2: 99% 99%    Last Pain:  Vitals:   08/12/17 1655  PainSc: 0-No pain         Complications: No apparent anesthesia complications

## 2017-08-13 ENCOUNTER — Encounter (HOSPITAL_COMMUNITY): Payer: Self-pay | Admitting: Orthopedic Surgery

## 2017-08-13 NOTE — Op Note (Signed)
NAMECHARRON, [name] NO.:  [id number]  MEDICAL RECORD NO.:  [id number]  LOCATION:  A07C                         FACILITY:  [facility]  PHYSICIAN:  Satira Anis. Davion Meara, M.D.DATE OF BIRTH:  September 26, 1960  DATE OF PROCEDURE: DATE OF DISCHARGE:                              OPERATIVE REPORT   PREOPERATIVE DIAGNOSIS:  Right 5th metacarpal fracture, displaced.  POSTOPERATIVE DIAGNOSIS:  Right 5th metacarpal fracture, displaced.  SURGICAL PROCEDURES: 1. Open reduction and internal fixation, right 5th metacarpal     fracture, displaced, with Synthes 1.5 and 2.0 screws interfrag in     nature. 2. Dorsal sensory nerve neurolysis, extensive in nature (dorsal     sensory ulnar branch nerve). 3. Four-view radiographic series, right hand.  SURGEON:  Satira Anis. Amedeo Plenty, MD.  ASSISTANT:  None.  COMPLICATIONS:  None.  ANESTHESIA:  Block with IV sedation.  TOURNIQUET TIME:  Less than an hour.  INDICATIONS FOR THE PROCEDURE:  A 56 year old female presents with the above-mentioned diagnosis.  I have counselled her in regard to risks and benefits surgery including risk of infection, bleeding, anesthesia, damage to normal structures, and failure of surgery to accomplish its intended goals of relieving symptoms and restoring function.  With this in mind, she desires to proceed.  All questions have been encouraged and answered preoperatively.  OPERATIVE PROCEDURE:  The patient was seen by myself and Anesthesia, taken to the operative theater, underwent smooth induction of IV sedation, prepped and draped in usual sterile fashion.  Time-out was observed.  Preoperative antibiotics were given.  Hibiclens scrub followed by a 10-minute surgical Betadine scrub and paint was accomplished and the operation commenced with evaluation of the hand. She had a completely and stable fracture.  At this time, we performed a very careful and cautious approach to the extremity with incision followed  by dissecting down.  The extensor apparatus and dorsal sensory ulnar branch were identified.  It was encased in soft tissue and close to the fracture site.  I disengaged the tendons and performed an advancing neurolysis of the dorsal sensory ulnar branch of the nerve. The patient tolerated this well.  There were no complicating features. Following this, I then accessed the fracture site.  Once the fracture site was accessed, I then performed very careful and cautious approach to ORIF.  I performed curette at the fracture site followed by clamping, followed by placement of a 2.0 and 1.5 interfrag screw without difficulty off the Synthes set.  Coaptation of the cancellous and cortical surfaces were accomplished with the 2 screws.  Unfortunately, there was absolutely no room for a plate and minimal room for just these screws.  Nevertheless, it was tight and secure with passive range of motion attempts and tenodesis attempts.  Thus, it looks stable.  I then repaired the periosteum with 4-0 Vicryl, irrigated copiously and closed wound with Prolene.  The patient tolerated this well.  A 4-view x-ray series was taken, which looked excellent.  There were no complicating features following this.  The patient then underwent placement of a splint.  This was an ulnar gutter splint incorporating the small finger, ring finger, and of course the middle finger.  Index and thumb  were left alone.  Going forward, we will see her in the office in 2 weeks and plan for suture removal followed by therapy for splint.  This will be an ulnar gutter splint.  PIP and DIP range of motion very gently will be begun. MCP motion will be held for 4-6 weeks predicated on radiographs looking good.  At 4-6 weeks, we will plan for active range of motion, at 6-8 weeks some gentle interval aggressive motion and that at 7-8 weeks, strengthening. Do's and don'ts have been discussed.  All questions were encouraged and answered.   It is pleasure to see her today and participate in her care. She will be discharged home on appropriate medicines including Keflex x5 days and OxyIR.     Satira Anis. Amedeo Plenty, M.D.     [facility]  D:  08/12/2017  T:  08/13/2017  Job:  [id number]

## 2017-08-25 ENCOUNTER — Ambulatory Visit (HOSPITAL_COMMUNITY): Payer: PRIVATE HEALTH INSURANCE

## 2017-08-26 ENCOUNTER — Encounter (HOSPITAL_COMMUNITY): Payer: Self-pay

## 2017-08-26 ENCOUNTER — Ambulatory Visit (HOSPITAL_COMMUNITY)
Admission: RE | Admit: 2017-08-26 | Discharge: 2017-08-26 | Disposition: A | Discharge status: Home / Self Care | Payer: PRIVATE HEALTH INSURANCE | Source: Ambulatory Visit | Attending: Obstetrics and Gynecology | Admitting: Obstetrics and Gynecology

## 2017-08-26 DIAGNOSIS — Z1231 Encounter for screening mammogram for malignant neoplasm of breast: Secondary | ICD-10-CM | POA: Diagnosis not present

## 2018-03-17 ENCOUNTER — Encounter: Payer: PRIVATE HEALTH INSURANCE | Attending: Nurse Practitioner | Admitting: Nurse Practitioner

## 2018-03-17 DIAGNOSIS — L97212 Non-pressure chronic ulcer of right calf with fat layer exposed: Secondary | ICD-10-CM | POA: Insufficient documentation

## 2018-03-17 DIAGNOSIS — Z87891 Personal history of nicotine dependence: Secondary | ICD-10-CM | POA: Diagnosis not present

## 2018-03-18 NOTE — Progress Notes (Signed)
[name], [name] ([id number]) Visit Report for 03/17/2018 Abuse/Suicide Risk Screen Details Patient Name: [name], [name]. Date of Service: 03/17/2018 12:30 PM Medical Record Number: [id number] Patient Account Number: [id number] Date of Birth/Sex: 25-Jun-1961 (57 y.o. F) Treating RN: Montey Hora Primary Care Courvoisier Hamblen: [employer] Other Clinician: Referring Acen Craun: Collene Mares Treating Terrace Chiem/Extender: Cathie Olden in Treatment: 0 Abuse/Suicide Risk Screen Items Answer ABUSE/SUICIDE RISK SCREEN: Has anyone close to you tried to hurt or harm you recentlyo No Do you feel uncomfortable with anyone in your familyo No Has anyone forced you do things that you didnot want to doo No Do you have any thoughts of harming yourselfo No Patient displays signs or symptoms of abuse and/or neglect. No Electronic Signature(s) Signed: 03/17/2018 5:06:29 PM By: Montey Hora Entered By: Montey Hora on 03/17/2018 13:03:09 [name] ([id number]) -------------------------------------------------------------------------------- Activities of Daily Living Details Patient Name: [name], [name]. Date of Service: 03/17/2018 12:30 PM Medical Record Number: [id number] Patient Account Number: [id number] Date of Birth/Sex: [birth year] (56 y.o. F) Treating RN: Montey Hora Primary Care Brycelyn Gambino: [employer] Other Clinician: Referring Archit Leger: Collene Mares Treating Rulon Abdalla/Extender: Cathie Olden in Treatment: 0 Activities of Daily Living Items Answer Activities of Daily Living (Please select one for each item) Drive Automobile Completely Able Take Medications Completely Able Use Telephone Completely Able Care for Appearance Completely Able Use Toilet Completely Able Bath / Shower Completely Able Dress Self Completely Able Feed Self Completely Able Walk Completely Able Get In / Out Bed Completely Able Housework Completely Able Prepare Meals Completely  [place] for Self Completely Able Electronic Signature(s) Signed: 03/17/2018 5:06:29 PM By: Montey Hora Entered By: Montey Hora on 03/17/2018 13:03:28 [name] ([id number]) -------------------------------------------------------------------------------- Education Assessment Details Patient Name: [name]. Date of Service: 03/17/2018 12:30 PM Medical Record Number: [id number] Patient Account Number: [id number] Date of Birth/Sex: Aug 13, 1961 (57 y.o. F) Treating RN: Montey Hora Primary Care Toneisha Savary: [employer] Other Clinician: Referring Jowell Bossi: Collene Mares Treating Maylyn Narvaiz/Extender: Cathie Olden in Treatment: 0 Primary Learner Assessed: Patient Learning Preferences/Education Level/Primary Language Learning Preference: Explanation, Demonstration Highest Education Level: College or Above Preferred Language: English Cognitive Barrier Assessment/Beliefs Language Barrier: No Translator Needed: No Memory Deficit: No Emotional Barrier: No Cultural/Religious Beliefs Affecting Medical Care: No Physical Barrier Assessment Impaired Vision: No Impaired Hearing: No Decreased Hand dexterity: No Knowledge/Comprehension Assessment Knowledge Level: Medium Comprehension Level: Medium Ability to understand written Medium instructions: Ability to understand verbal Medium instructions: Motivation Assessment Anxiety Level: Calm Cooperation: Cooperative Education Importance: Acknowledges Need Interest in Health Problems: Asks Questions Perception: Coherent Willingness to Engage in Self- Medium Management Activities: Readiness to Engage in Self- Medium Management Activities: Electronic Signature(s) Signed: 03/17/2018 5:06:29 PM By: Montey Hora Entered By: Montey Hora on 03/17/2018 13:03:45 [name] ([id number]) -------------------------------------------------------------------------------- Fall Risk  Assessment Details Patient Name: [name]. Date of Service: 03/17/2018 12:30 PM Medical Record Number: [id number] Patient Account Number: [id number] Date of Birth/Sex: 1960/10/18 (57 y.o. F) Treating RN: Montey Hora Primary Care Wynter Isaacs: [employer] Other Clinician: Referring Maleya Leever: Collene Mares Treating Kassadie Pancake/Extender: Cathie Olden in Treatment: 0 Fall Risk Assessment Items Have you had 2 or more falls in the last 12 monthso 0 No Have you had any fall that resulted in injury in the last 12 monthso 0 No FALL RISK ASSESSMENT: History of falling - immediate or within 3 months 0 No Secondary diagnosis 0 No Ambulatory aid None/bed rest/wheelchair/nurse 0 Yes Crutches/cane/walker 0 No Furniture 0 No IV Access/Saline  Lock 0 No Gait/Training Normal/bed rest/immobile 0 No Weak 10 Yes Impaired 0 No Mental Status Oriented to own ability 0 Yes Electronic Signature(s) Signed: 03/17/2018 5:06:29 PM By: Montey Hora Entered By: Montey Hora on 03/17/2018 13:03:56 [name] ([id number]) -------------------------------------------------------------------------------- Foot Assessment Details Patient Name: [name]. Date of Service: 03/17/2018 12:30 PM Medical Record Number: [id number] Patient Account Number: [id number] Date of Birth/Sex: [birth year] (57 y.o. F) Treating RN: Montey Hora Primary Care Caraline Deutschman: [employer] Other Clinician: Referring Jerney Baksh: Collene Mares Treating Keeyon Privitera/Extender: Cathie Olden in Treatment: 0 Foot Assessment Items Site Locations + = Sensation present, - = Sensation absent, C = Callus, U = Ulcer R = Redness, W = Warmth, M = Maceration, PU = Pre-ulcerative lesion F = Fissure, S = Swelling, D = Dryness Assessment Right: Left: Other Deformity: No No Prior Foot Ulcer: No No Prior Amputation: No No Charcot Joint: No No Ambulatory Status: Ambulatory Without Help Gait: Steady Electronic  Signature(s) Signed: 03/17/2018 5:06:29 PM By: Montey Hora Entered By: Montey Hora on 03/17/2018 13:04:17 [name] ([id number]) -------------------------------------------------------------------------------- Nutrition Risk Assessment Details Patient Name: Rachel Docker D. Date of Service: 03/17/2018 12:30 PM Medical Record Number: [id number] Patient Account Number: [id number] Date of Birth/Sex: May 08, 1961 (57 y.o. F) Treating RN: Montey Hora Primary Care Vir Whetstine: [employer] Other Clinician: Referring Eileen Croswell: Collene Mares Treating Mykell Rawl/Extender: Cathie Olden in Treatment: 0 Height (in): 62 Weight (lbs): 223 Body Mass Index (BMI): 40.8 Nutrition Risk Assessment Items NUTRITION RISK SCREEN: I have an illness or condition that made me change the kind and/or amount of 0 No food I eat I eat fewer than two meals per day 0 No I eat few fruits and vegetables, or milk products 0 No I have three or more drinks of beer, liquor or wine almost every day 0 No I have tooth or mouth problems that make it hard for me to eat 0 No I don't always have enough money to buy the food I need 0 No I eat alone most of the time 0 No I take three or more different prescribed or over-the-counter drugs a day 1 Yes Without wanting to, I have lost or gained 10 pounds in the last six months 0 No I am not always physically able to shop, cook and/or feed myself 0 No Nutrition Protocols Good Risk Protocol Moderate Risk Protocol Electronic Signature(s) Signed: 03/17/2018 5:06:29 PM By: Montey Hora Entered By: Montey Hora on 03/17/2018 13:04:00

## 2018-03-19 NOTE — Progress Notes (Signed)
NKENGE, SONNTAG ([id number]) Visit Report for 03/17/2018 Chief Complaint Document Details Patient Name: [name], [name]. Date of Service: 03/17/2018 12:30 PM Medical Record Number: [id number] Patient Account Number: [id number] Date of Birth/Sex: 17-Nov-1960 (57 y.o. F) Treating RN: Ahmed Prima Primary Care Provider: [employer] Other Clinician: Referring Provider: Collene Mares Treating Provider/Extender: Cathie Olden in Treatment: 0 Information Obtained from: Patient Chief Complaint right posterior calf ulcer Electronic Signature(s) Signed: 03/17/2018 8:13:47 PM By: Lawanda Cousins Entered By: Lawanda Cousins on 03/17/2018 20:13:47 [name] ([id number]) -------------------------------------------------------------------------------- Debridement Details Patient Name: [name]. Date of Service: 03/17/2018 12:30 PM Medical Record Number: [id number] Patient Account Number: [id number] Date of Birth/Sex: [birth year] (57 y.o. F) Treating RN: Ahmed Prima Primary Care Provider: [employer] Other Clinician: Referring Provider: Collene Mares Treating Provider/Extender: Cathie Olden in Treatment: 0 Debridement Performed for Wound #1 Right,Posterior Lower Leg Assessment: Performed By: Physician Lawanda Cousins, NP Debridement Type: Debridement Pre-procedure Verification/Time Yes - 13:40 Out Taken: Start Time: 13:40 Pain Control: Lidocaine 4% Topical Solution Total Area Debrided (L x W): 4.6 (cm) x 6.2 (cm) = 28.52 (cm) Tissue and other material Viable, Non-Viable, Slough, Subcutaneous, Fibrin/Exudate, Slough debrided: Level: Skin/Subcutaneous Tissue Debridement Description: Excisional Instrument: Curette Bleeding: Minimum Hemostasis Achieved: Pressure End Time: 13:43 Procedural Pain: 0 Post Procedural Pain: 0 Response to Treatment: Procedure was tolerated well Level of Consciousness: Awake and Alert Post Debridement Measurements of  Total Wound Length: (cm) 4.6 Width: (cm) 6.2 Depth: (cm) 0.2 Volume: (cm) 4.48 Character of Wound/Ulcer Post Debridement: Requires Further Debridement Post Procedure Diagnosis Same as Pre-procedure Electronic Signature(s) Signed: 03/17/2018 9:10:59 PM By: Lawanda Cousins Signed: 03/18/2018 11:31:29 AM By: Alric Quan Entered By: Alric Quan on 03/17/2018 13:43:34 [name] ([id number]) -------------------------------------------------------------------------------- HPI Details Patient Name: Rachel Docker D. Date of Service: 03/17/2018 12:30 PM Medical Record Number: [id number] Patient Account Number: [id number] Date of Birth/Sex: July 16, 1961 (57 y.o. F) Treating RN: Ahmed Prima Primary Care Provider: [employer] Other Clinician: Referring Provider: Collene Mares Treating Provider/Extender: Cathie Olden in Treatment: 0 History of Present Illness HPI Description: 03/17/18-She is here for initial evaluation of the right posterior calf ulcer. She states that roughly 3 weeks ago, she was [place] the beach, and abraded her right calf on a bicycle tire, developing a non-blistered hematoma. She states presently 3 days after that she lacerated the hematoma on a table. After returning home she went to urgent care, had Steri-Strips placed and was prescribed Cefdnir and topical Polysporin. She states the pain is improved. She denies wearing any compression therapy. she is nondiabetic and does not smoke Electronic Signature(s) Signed: 03/17/2018 8:55:30 PM By: Lawanda Cousins Entered By: Lawanda Cousins on 03/17/2018 20:55:30 [name] ([id number]) -------------------------------------------------------------------------------- Physical Exam Details Patient Name: [name]. Date of Service: 03/17/2018 12:30 PM Medical Record Number: [id number] Patient Account Number: [id number] Date of Birth/Sex: 01-03-1961 (57 y.o. F) Treating RN: Ahmed Prima Primary Care Provider: [employer] Other Clinician: Referring Provider: Collene Mares Treating Provider/Extender: Lawanda Cousins Weeks in Treatment: 0 Respiratory respirations are even and unlabored. clear throughout. Cardiovascular s1 s2 regular rate and rhythm. RLE- palpable DP. RLE- edema, warm to touch. Musculoskeletal ambulates with no assistive devices. Psychiatric appears to have appropriate insight and judgement to medical care. oriented x4. Electronic Signature(s) Signed: 03/17/2018 8:15:39 PM By: Lawanda Cousins Entered By: Lawanda Cousins on 03/17/2018 20:15:39 [name] ([id number]) -------------------------------------------------------------------------------- Physician Orders Details Patient Name: Rachel Docker D. Date of Service: 03/17/2018 12:30 PM Medical Record  Number: [id number] Patient Account Number: [id number] Date of Birth/Sex: 1960-11-18 (57 y.o. F) Treating RN: Ahmed Prima Primary Care Provider: [employer] Other Clinician: Referring Provider: Collene Mares Treating Provider/Extender: Cathie Olden in Treatment: 0 Verbal / Phone Orders: Yes Clinician: Pinkerton, Debi Read Back and Verified: Yes Diagnosis Coding Wound Cleansing Wound #1 Right,Posterior Lower Leg o Clean wound with Normal Saline. o Cleanse wound with mild soap and water o May shower with protection. Anesthetic (add to Medication List) Wound #1 Right,Posterior Lower Leg o Topical Lidocaine 4% cream applied to wound bed prior to debridement (In Clinic Only). Primary Wound Dressing Wound #1 Right,Posterior Lower Leg o Medihoney gel Secondary Dressing Wound #1 Right,Posterior Lower Leg o ABD and Kerlix/Conform o Non-adherent pad Dressing Change Frequency Wound #1 Right,Posterior Lower Leg o Change dressing every day. Follow-up Appointments Wound #1 Right,Posterior Lower Leg o Return Appointment in 1 week. Edema Control Wound #1  Right,Posterior Lower Leg o Elevate legs to the level of the heart and pump ankles as often as possible o Other: - ace wrap Additional Orders / Instructions Wound #1 Right,Posterior Lower Leg o Increase protein intake. Patient Medications Allergies: No Known Allergies Notifications Medication Indication Start End lidocaine [name], [name] ([id number]) Notifications Medication Indication Start End DOSE 1 - topical 4 % cream - 1 cream topical Electronic Signature(s) Signed: 03/17/2018 9:10:59 PM By: Lawanda Cousins Signed: 03/18/2018 11:31:29 AM By: Alric Quan Entered By: Alric Quan on 03/17/2018 13:52:43 [name] ([id number]) -------------------------------------------------------------------------------- Prescription 03/17/2018 Patient Name: [name] Provider: Lawanda Cousins NP Date of Birth: 1961/06/29 NPI#: [id number] Sex: F DEA#: [id number] Phone #: [phone] License #: Patient Address: [facility] [place], [place] [address] [address], [place],  [address] [phone] Allergies No Known Allergies Medication Medication: Route: Strength: Form: lidocaine topical 4% cream Class: TOPICAL LOCAL ANESTHETICS Dose: Frequency / Time: Indication: 1 1 cream topical Number of Refills: Number of Units: 0 Generic Substitution: Start Date: End Date: Administered at Substitution Permitted Facility: Yes Time Administered: Time Discontinued: Note to Pharmacy: Signature(s): Date(s): Electronic Signature(s) Signed: 03/17/2018 9:10:59 PM By: Lawanda Cousins Signed: 03/18/2018 11:31:29 AM By: Alric Quan Entered By: Alric Quan on 03/17/2018 13:52:44 [name] ([id number]) Izora Ribas, Olean Ree ([id number]) --------------------------------------------------------------------------------  Problem List Details Patient Name:  Rachel Docker D. Date of Service: 03/17/2018 12:30 PM Medical Record Number: [id number] Patient Account Number: [id number] Date of Birth/Sex: 14-Feb-1961 (57 y.o. F) Treating RN: Ahmed Prima Primary Care Provider: [employer] Other Clinician: Referring Provider: Collene Mares Treating Provider/Extender: Cathie Olden in Treatment: 0 Active Problems ICD-10 Evaluated Encounter Code Description Active Date Today Diagnosis L97.212 Non-pressure chronic ulcer of right calf with fat layer exposed 03/17/2018 No Yes Inactive Problems Resolved Problems Electronic Signature(s) Signed: 03/17/2018 8:11:39 PM By: Lawanda Cousins Entered By: Lawanda Cousins on 03/17/2018 20:11:39 [name] ([id number]) -------------------------------------------------------------------------------- Progress Note Details Patient Name: Rachel Docker D. Date of Service: 03/17/2018 12:30 PM Medical Record Number: [id number] Patient Account Number: [id number] Date of Birth/Sex: [birth year] (57 y.o. F) Treating RN: Ahmed Prima Primary Care Provider: [employer] Other Clinician: Referring Provider: Collene Mares Treating Provider/Extender: Cathie Olden in Treatment: 0 Subjective Chief Complaint Information obtained from Patient right posterior calf ulcer History of Present Illness (HPI) 03/17/18-She is here for initial evaluation of the right posterior calf ulcer. She states that roughly 3 weeks ago, she was at a time at the beach, and abraded her right calf on  a bicycle tire, developing a non-blistered hematoma. She states presently 3 days after that she lacerated the hematoma on a table. After returning home she went to urgent care, had Steri-Strips placed and was prescribed Cefdnir and topical Polysporin. She states the pain is improved. She denies wearing any compression therapy. she is nondiabetic and does not smoke Wound History Patient presents with 1 open wound that has been  present for approximately 3 weeks. Patient has been treating wound in the following manner: mupirocin. Laboratory tests have not been performed in the last month. Patient reportedly has not tested positive for an antibiotic resistant organism. Patient reportedly has not tested positive for osteomyelitis. Patient reportedly has had testing performed to evaluate circulation in the legs. Patient History Information obtained from Patient. Allergies No Known Allergies Social History Former smoker - 15 years, Marital Status - Married, Alcohol Use - Rarely, Drug Use - No History, Caffeine Use - Daily. Medical History Eyes Denies history of Cataracts, Glaucoma, Optic Neuritis Ear/Nose/Mouth/Throat Denies history of Chronic sinus problems/congestion, Middle ear problems Hematologic/Lymphatic Denies history of Anemia, Hemophilia, Human Immunodeficiency Virus, Lymphedema, Sickle Cell Disease Respiratory Denies history of Aspiration, Asthma, Chronic Obstructive Pulmonary Disease (COPD), Pneumothorax, Sleep Apnea, Tuberculosis Cardiovascular Denies history of Angina, Arrhythmia, Congestive Heart Failure, Coronary Artery Disease, Deep Vein Thrombosis, Hypertension, Hypotension, Myocardial Infarction, Peripheral Arterial Disease, Peripheral Venous Disease, Phlebitis, Vasculitis Gastrointestinal Denies history of Cirrhosis , Colitis, Crohn s, Hepatitis A, Hepatitis B, Hepatitis C Endocrine [name], [name] ([id number]) Denies history of Type I Diabetes, Type II Diabetes Genitourinary Denies history of End Stage Renal Disease Immunological Denies history of Lupus Erythematosus, Raynaud s, Scleroderma Integumentary (Skin) Denies history of History of Burn, History of pressure wounds Musculoskeletal Denies history of Gout, Rheumatoid Arthritis, Osteoarthritis, Osteomyelitis Neurologic Denies history of Dementia, Neuropathy, Quadriplegia, Paraplegia, Seizure Disorder Oncologic Denies history of  Received Chemotherapy, Received Radiation Psychiatric Denies history of Anorexia/bulimia, Confinement Anxiety Medical And Surgical History Notes Cardiovascular MVP Oncologic skin cancers Review of Systems (ROS) Constitutional Symptoms (General Health) The patient has no complaints or symptoms. Eyes Denies complaints or symptoms of Dry Eyes, Vision Changes, Glasses / Contacts. Ear/Nose/Mouth/Throat Denies complaints or symptoms of Difficult clearing ears, Sinusitis. Hematologic/Lymphatic Denies complaints or symptoms of Bleeding / Clotting Disorders, Human Immunodeficiency Virus. Respiratory Denies complaints or symptoms of Chronic or frequent coughs, Shortness of Breath. Cardiovascular Complains or has symptoms of LE edema. Denies complaints or symptoms of Chest pain. Gastrointestinal Denies complaints or symptoms of Frequent diarrhea, Nausea, Vomiting. Endocrine Denies complaints or symptoms of Hepatitis, Thyroid disease, Polydypsia (Excessive Thirst). Genitourinary Denies complaints or symptoms of Kidney failure/ Dialysis, Incontinence/dribbling. Immunological Denies complaints or symptoms of Hives, Itching. Integumentary (Skin) Complains or has symptoms of Wounds. Denies complaints or symptoms of Bleeding or bruising tendency, Breakdown, Swelling. Musculoskeletal Denies complaints or symptoms of Muscle Pain, Muscle Weakness. Neurologic Denies complaints or symptoms of Numbness/parasthesias, Focal/Weakness. Psychiatric Denies complaints or symptoms of Anxiety, Claustrophobia. [name], [name]. ([id number]) Objective Constitutional Vitals Time Taken: 12:58 PM, Height: 62 in, Source: Measured, Weight: 223 lbs, Source: Measured, BMI: 40.8, Temperature: 97.9 F, Pulse: 74 bpm, Respiratory Rate: 16 breaths/min, Blood Pressure: 155/99 mmHg. Respiratory respirations are even and unlabored. clear throughout. Cardiovascular s1 s2 regular rate and rhythm. RLE- palpable DP.  RLE- edema, warm to touch. Musculoskeletal ambulates with no assistive devices. Psychiatric appears to have appropriate insight and judgement to medical care. oriented x4. Integumentary (Hair, Skin) Wound #1 status is Open. Original cause of wound was Trauma. The wound is located  on the Right,Posterior Lower Leg. The wound measures 4.6cm length x 6.2cm width x 0.1cm depth; 22.4cm^2 area and 2.24cm^3 volume. There is Fat Layer (Subcutaneous Tissue) Exposed exposed. There is no tunneling or undermining noted. There is a medium amount of serous drainage noted. The wound margin is flat and intact. There is small (1-33%) pink granulation within the wound bed. There is a large (67-100%) amount of necrotic tissue within the wound bed including Eschar and Adherent Slough. The periwound skin appearance exhibited: Erythema. The periwound skin appearance did not exhibit: Callus, Crepitus, Excoriation, Induration, Rash, Scarring, Dry/Scaly, Maceration, Atrophie Blanche, Cyanosis, Ecchymosis, Hemosiderin Staining, Mottled, Pallor, Rubor. The surrounding wound skin color is noted with erythema which is circumferential. Periwound temperature was noted as No Abnormality. The periwound has tenderness on palpation. Assessment Active Problems ICD-10 Non-pressure chronic ulcer of right calf with fat layer exposed Procedures Wound #1 Pre-procedure diagnosis of Wound #1 is a Skin Tear located on the Right,Posterior Lower Leg . There was a Excisional Skin/Subcutaneous Tissue Debridement with a total area of 28.52 sq cm performed by Lawanda Cousins, NP. With the following instrument(s): Curette to remove Viable and Non-Viable tissue/material. Material removed includes Subcutaneous Tissue, Slough, and Fibrin/Exudate after achieving pain control using Lidocaine 4% Topical Solution. No specimens were taken. A time out was conducted at 13:40, prior to the start of the procedure. A Minimum amount of bleeding was  controlled with [name], [name]. ([id number]) Pressure. The procedure was tolerated well with a pain level of 0 throughout and a pain level of 0 following the procedure. Patient s Level of Consciousness post procedure was recorded as Awake and Alert. Post Debridement Measurements: 4.6cm length x 6.2cm width x 0.2cm depth; 4.48cm^3 volume. Character of Wound/Ulcer Post Debridement requires further debridement. Post procedure Diagnosis Wound #1: Same as Pre-Procedure Plan Wound Cleansing: Wound #1 Right,Posterior Lower Leg: Clean wound with Normal Saline. Cleanse wound with mild soap and water May shower with protection. Anesthetic (add to Medication List): Wound #1 Right,Posterior Lower Leg: Topical Lidocaine 4% cream applied to wound bed prior to debridement (In Clinic Only). Primary Wound Dressing: Wound #1 Right,Posterior Lower Leg: Medihoney gel Secondary Dressing: Wound #1 Right,Posterior Lower Leg: ABD and Kerlix/Conform Non-adherent pad Dressing Change Frequency: Wound #1 Right,Posterior Lower Leg: Change dressing every day. Follow-up Appointments: Wound #1 Right,Posterior Lower Leg: Return Appointment in 1 week. Edema Control: Wound #1 Right,Posterior Lower Leg: Elevate legs to the level of the heart and pump ankles as often as possible Other: - ace wrap Additional Orders / Instructions: Wound #1 Right,Posterior Lower Leg: Increase protein intake. The following medication(s) was prescribed: lidocaine topical 4 % cream 1 1 cream topical was prescribed at facility Electronic Signature(s) Signed: 03/17/2018 8:55:58 PM By: Lawanda Cousins Entered By: Lawanda Cousins on 03/17/2018 20:55:57 [name] ([id number]) -------------------------------------------------------------------------------- ROS/PFSH Details Patient Name: [name]. Date of Service: 03/17/2018 12:30 PM Medical Record Number: [id number] Patient Account Number: [id number] Date of Birth/Sex:  Oct 13, 1960 (57 y.o. F) Treating RN: Montey Hora Primary Care Provider: [employer] Other Clinician: Referring Provider: Collene Mares Treating Provider/Extender: Cathie Olden in Treatment: 0 Information Obtained From Patient Wound History Do you currently have one or more open woundso Yes How many open wounds do you currently haveo 1 Approximately how long have you had your woundso 3 weeks How have you been treating your wound(s) until nowo mupirocin Has your wound(s) ever healed and then re-openedo No Have you had any lab work done in the past montho No  Have you tested positive for an antibiotic resistant organism (MRSA, VRE)o No Have you tested positive for osteomyelitis (bone infection)o No Have you had any tests for circulation on your legso Yes Who ordered the testo PCP Where was the test doneo [facility] Constitutional Symptoms (General Health) Complaints and Symptoms: No Complaints or Symptoms Complaints and Symptoms: Negative for: Fatigue; Fever; Chills; Marked Weight Change Eyes Complaints and Symptoms: Negative for: Dry Eyes; Vision Changes; Glasses / Contacts Medical History: Negative for: Cataracts; Glaucoma; Optic Neuritis Ear/Nose/Mouth/Throat Complaints and Symptoms: Negative for: Difficult clearing ears; Sinusitis Medical History: Negative for: Chronic sinus problems/congestion; Middle ear problems Hematologic/Lymphatic Complaints and Symptoms: Negative for: Bleeding / Clotting Disorders; Human Immunodeficiency Virus Medical History: Negative for: Anemia; Hemophilia; Human Immunodeficiency Virus; Lymphedema; Sickle Cell Disease Respiratory [name], [name]. ([id number]) Complaints and Symptoms: Negative for: Chronic or frequent coughs; Shortness of Breath Medical History: Negative for: Aspiration; Asthma; Chronic Obstructive Pulmonary Disease (COPD); Pneumothorax; Sleep Apnea; Tuberculosis Cardiovascular Complaints and Symptoms: Positive for:  LE edema Negative for: Chest pain Medical History: Negative for: Angina; Arrhythmia; Congestive Heart Failure; Coronary Artery Disease; Deep Vein Thrombosis; Hypertension; Hypotension; Myocardial Infarction; Peripheral Arterial Disease; Peripheral Venous Disease; Phlebitis; Vasculitis Past Medical History Notes: MVP Gastrointestinal Complaints and Symptoms: Negative for: Frequent diarrhea; Nausea; Vomiting Medical History: Negative for: Cirrhosis ; Colitis; Crohnos; Hepatitis A; Hepatitis B; Hepatitis C Endocrine Complaints and Symptoms: Negative for: Hepatitis; Thyroid disease; Polydypsia (Excessive Thirst) Medical History: Negative for: Type I Diabetes; Type II Diabetes Genitourinary Complaints and Symptoms: Negative for: Kidney failure/ Dialysis; Incontinence/dribbling Medical History: Negative for: End Stage Renal Disease Immunological Complaints and Symptoms: Negative for: Hives; Itching Medical History: Negative for: Lupus Erythematosus; Raynaudos; Scleroderma Integumentary (Skin) Complaints and Symptoms: Positive for: Wounds Negative for: Bleeding or bruising tendency; Breakdown; Swelling Medical HistoryIMANNI, [name] ([id number]) Negative for: History of Burn; History of pressure wounds Musculoskeletal Complaints and Symptoms: Negative for: Muscle Pain; Muscle Weakness Medical History: Negative for: Gout; Rheumatoid Arthritis; Osteoarthritis; Osteomyelitis Neurologic Complaints and Symptoms: Negative for: Numbness/parasthesias; Focal/Weakness Medical History: Negative for: Dementia; Neuropathy; Quadriplegia; Paraplegia; Seizure Disorder Psychiatric Complaints and Symptoms: Negative for: Anxiety; Claustrophobia Medical History: Negative for: Anorexia/bulimia; Confinement Anxiety Oncologic Medical History: Negative for: Received Chemotherapy; Received Radiation Past Medical History Notes: skin cancers Immunizations Pneumococcal Vaccine: Received  Pneumococcal Vaccination: Yes Tetanus Vaccine: Last tetanus shot: 02/28/2018 Implantable Devices Family and Social History Former smoker - 48 years; Marital Status - Married; Alcohol Use: Rarely; Drug Use: No History; Caffeine Use: Daily; Financial Concerns: No; Food, Clothing or Shelter Needs: No; Support System Lacking: No; Transportation Concerns: No; Advanced Directives: No; Patient does not want information on Advanced Directives Electronic Signature(s) Signed: 03/17/2018 5:06:29 PM By: Montey Hora Signed: 03/17/2018 9:10:59 PM By: Lawanda Cousins Entered By: Montey Hora on 03/17/2018 13:02:59 [name] ([id number]) -------------------------------------------------------------------------------- SuperBill Details Patient Name: [name]. Date of Service: 03/17/2018 Medical Record Number: [id number] Patient Account Number: [id number] Date of Birth/Sex: 1961/05/05 (57 y.o. F) Treating RN: Ahmed Prima Primary Care Provider: [employer] Other Clinician: Referring Provider: Collene Mares Treating Provider/Extender: Cathie Olden in Treatment: 0 Diagnosis Coding ICD-10 Codes Code Description [phone] Non-pressure chronic ulcer of right calf with fat layer exposed Facility Procedures CPT4 Code: [id number] Description: [facility] VISIT-LEV 3 EST PT Modifier: Quantity: 1 CPT4 Code: [id number] Description: [place] TISSUE 20 SQ CM/< ICD-10 Diagnosis Description L97.212 Non-pressure chronic ulcer of right calf with fat layer expo Modifier: sed Quantity: 1 CPT4 Code: [id number] Description: 15176 -  DEB SUBQ TISS EA ADDL 20CM ICD-10 Diagnosis Description L97.212 Non-pressure chronic ulcer of right calf with fat layer expo Modifier: sed Quantity: 1 Physician Procedures CPT4 Code: [id number] Description: WC PHYS LEVEL 3 o NEW PT ICD-10 Diagnosis Description R00.762 Non-pressure chronic ulcer of right calf with fat layer expo Modifier:  sed Quantity: 1 CPT4 Code: [id number] Description: 56256 - WC PHYS SUBQ TISS 20 SQ CM ICD-10 Diagnosis Description L97.212 Non-pressure chronic ulcer of right calf with fat layer expo Modifier: sed Quantity: 1 CPT4 Code: [id number] Description: 28768 - WC PHYS SUBQ TISS EA ADDL 20 CM ICD-10 Diagnosis Description T15.726 Non-pressure chronic ulcer of right calf with fat layer expo Modifier: sed Quantity: 1 Electronic Signature(s) Signed: 03/17/2018 8:56:30 PM By: Lawanda Cousins Entered By: Lawanda Cousins on 03/17/2018 20:56:30

## 2018-03-19 NOTE — Progress Notes (Signed)
[name] ([id number]) Visit Report for 03/17/2018 Allergy List Details Patient Name: [name], [name]. Date of Service: 03/17/2018 12:30 PM Medical Record Number: [id number] Patient Account Number: [id number] Date of Birth/Sex: 07-Sep-1961 (57 y.o. F) Treating RN: [name] Primary Care [name]: [employer] Other Clinician: Referring [name]: Collene Mares Treating [name]/Extender: [name] in Treatment: 0 Allergies Active Allergies No Known Allergies Allergy Notes Electronic Signature(s) Signed: 03/17/2018 5:06:29 PM By: [name] Entered By: [name] on 03/17/2018 12:59:22 [name] ([id number]) -------------------------------------------------------------------------------- Arrival Information Details Patient Name: [name]. Date of Service: 03/17/2018 12:30 PM Medical Record Number: [id number] Patient Account Number: [id number] Date of Birth/Sex: [birth year] (57 y.o. F) Treating RN: [name] Primary Care [name]: [employer] Other Clinician: Referring Malashia Kamaka: Collene Mares Treating [name]/Extender: [name] in Treatment: 0 Visit Information Patient Arrived: Ambulatory Arrival Time: 12:45 Accompanied By: self Transfer Assistance: None Patient Identification Verified: Yes Secondary Verification Process Completed: Yes Electronic Signature(s) Signed: 03/17/2018 5:06:29 PM By: [name] Entered By: [name] on 03/17/2018 12:55:57 [name] ([id number]) -------------------------------------------------------------------------------- Clinic Level of Care Assessment Details Patient Name: [name]. Date of Service: 03/17/2018 12:30 PM Medical Record Number: [id number] Patient Account Number: [id number] Date of Birth/Sex: 04-Oct-1960 (57 y.o. F) Treating RN: [name] Primary Care [name]: [employer] Other Clinician: Referring [name]: Collene Mares Treating [name]/Extender: [name] in Treatment: 0 Clinic Level of Care Assessment Items TOOL 1 Quantity Score X - Use when EandM and Procedure is performed on INITIAL visit 1 0 ASSESSMENTS - Nursing Assessment / Reassessment X - General Physical Exam (combine w/ comprehensive assessment (listed just below) when 1 20 performed on new pt. evals) X- 1 25 Comprehensive Assessment (HX, ROS, Risk Assessments, Wounds Hx, etc.) ASSESSMENTS - Wound and Skin Assessment / Reassessment []  - Dermatologic / Skin Assessment (not related to wound area) 0 ASSESSMENTS - Ostomy and/or Continence Assessment and Care []  - Incontinence Assessment and Management 0 []  - 0 Ostomy Care Assessment and Management (repouching, etc.) PROCESS - Coordination of Care X - Simple Patient / Family Education for ongoing care 1 15 []  - 0 Complex (extensive) Patient / Family Education for ongoing care []  - 0 Staff obtains Programmer, systems, Records, Test Results / Process Orders []  - 0 Staff telephones HHA, Nursing Homes / Clarify orders / etc []  - 0 Routine Transfer to another Facility (non-emergent condition) []  - 0 Routine Hospital Admission (non-emergent condition) X- 1 15 New Admissions / Biomedical engineer / Ordering NPWT, Apligraf, etc. []  - 0 Emergency Hospital Admission (emergent condition) PROCESS - Special Needs []  - Pediatric / Minor Patient Management 0 []  - 0 Isolation Patient Management []  - 0 Hearing / Language / Visual special needs []  - 0 Assessment of Community assistance (transportation, D/C planning, etc.) []  - 0 Additional assistance / Altered mentation []  - 0 Support Surface(s) Assessment (bed, cushion, seat, etc.) [name], PAGE. ([id number]) INTERVENTIONS - Miscellaneous []  - External ear exam 0 []  - 0 Patient Transfer (multiple staff / Civil Service fast streamer / Similar devices) []  - 0 Simple Staple / Suture removal (25 or less) []  - 0 Complex Staple / Suture removal  (26 or more) []  - 0 Hypo/Hyperglycemic Management (do not check if billed separately) X- 1 15 Ankle / Brachial Index (ABI) - do not check if billed separately Has the patient been seen at the hospital within the last three years: Yes Total Score: 90 Level Of Care: New/Established - Level 3 Electronic Signature(s) Signed:  03/18/2018 11:31:29 AM By: [name] Entered By: [name] on 03/17/2018 16:37:47 [name] ([id number]) -------------------------------------------------------------------------------- Encounter Discharge Information Details Patient Name: [name]. Date of Service: 03/17/2018 12:30 PM Medical Record Number: [id number] Patient Account Number: [id number] Date of Birth/Sex: 1961/02/18 (57 y.o. F) Treating RN: [name] Primary Care [name]: [employer] Other Clinician: Referring [name]: Collene Mares Treating [name]/Extender: [name] in Treatment: 0 Encounter Discharge Information Items Discharge Condition: Stable Ambulatory Status: Ambulatory Discharge Destination: Home Transportation: Private Auto Schedule Follow-up Appointment: Yes Clinical Summary of Care: Electronic Signature(s) Signed: 03/18/2018 10:58:55 AM By: [name] Entered By: [name] on 03/17/2018 13:59:02 [name] ([id number]) -------------------------------------------------------------------------------- Lower Extremity Assessment Details Patient Name: [name]. Date of Service: 03/17/2018 12:30 PM Medical Record Number: [id number] Patient Account Number: [id number] Date of Birth/Sex: Sep 06, 1961 (57 y.o. F) Treating RN: [name] Primary Care Jelesa Mangini: [employer] Other Clinician: Referring Christon Gallaway: Collene Mares Treating Orange Hilligoss/Extender: [name] in Treatment: 0 Edema Assessment Assessed: [Left: No] [Right: No] Edema: [Left: Yes] [Right: Yes] Calf Left: Right: Point of  Measurement: 30 cm From Medial Instep 39.2 cm 40.3 cm Ankle Left: Right: Point of Measurement: 10 cm From Medial Instep 25.3 cm 26.1 cm Vascular Assessment Pulses: Dorsalis Pedis Palpable: [Left:Yes] [Right:Yes] Doppler Audible: [Left:Yes] [Right:Yes] Posterior Tibial Palpable: [Left:Yes] [Right:Yes] Doppler Audible: [Left:Yes] [Right:Yes] Extremity colors, hair growth, and conditions: Extremity Color: [Left:Hyperpigmented] [Right:Hyperpigmented] Hair Growth on Extremity: [Left:Yes] [Right:Yes] Temperature of Extremity: [Left:Warm] [Right:Warm] Capillary Refill: [Left:< 3 seconds] [Right:< 3 seconds] Blood Pressure: Brachial: [Left:152] [Right:158] Dorsalis Pedis: 164 [Left:Dorsalis Pedis: 093] Ankle: Posterior Tibial: 142 [Left:Posterior Tibial: 1.04] [Right:0.85] Toe Nail Assessment Left: Right: Thick: Yes Yes Discolored: No No Deformed: No No Improper Length and Hygiene: No No Electronic Signature(s) Signed: 03/17/2018 5:06:29 PM By: [name] Entered By: [name] on 03/17/2018 13:24:14 [name] ([id number]) Izora Ribas, [name] ([id number]) -------------------------------------------------------------------------------- Multi Wound Chart Details Patient Name: Rachel Docker D. Date of Service: 03/17/2018 12:30 PM Medical Record Number: [id number] Patient Account Number: [id number] Date of Birth/Sex: Apr 23, 1961 (57 y.o. F) Treating RN: [name] Primary Care Latanja Lehenbauer: [employer] Other Clinician: Referring Linzy Darling: Collene Mares Treating Loucille Takach/Extender: [name] in Treatment: 0 Vital Signs Height(in): 62 Pulse(bpm): 65 Weight(lbs): 223 Blood Pressure(mmHg): 155/99 Body Mass Index(BMI): 41 Temperature(F): 97.9 Respiratory Rate 16 (breaths/min): Photos: [N/A:N/A] Wound Location: Right Lower Leg - Posterior N/A N/A Wounding Event: Trauma N/A N/A Primary Etiology: Skin Tear N/A N/A Date Acquired: 03/03/2018 N/A  N/A [name] of Treatment: 0 N/A N/A Wound Status: Open N/A N/A Measurements L x W x D 4.6x6.2x0.1 N/A N/A (cm) Area (cm) : 22.4 N/A N/A Volume (cm) : 2.24 N/A N/A Classification: Full Thickness Without N/A N/A Exposed Support Structures Exudate Amount: Medium N/A N/A Exudate Type: Serous N/A N/A Exudate Color: amber N/A N/A Wound Margin: Flat and Intact N/A N/A Granulation Amount: Small (1-33%) N/A N/A Granulation Quality: Pink N/A N/A Necrotic Amount: Large (67-100%) N/A N/A Necrotic Tissue: Eschar, Adherent Slough N/A N/A Exposed Structures: Fat Layer (Subcutaneous N/A N/A Tissue) Exposed: Yes Fascia: No Tendon: No Muscle: No Joint: No Bone: No Epithelialization: None N/A N/A Debridement: Debridement - Excisional N/A N/A [name], [name] ([id number]) Pre-procedure 13:40 N/A N/A Verification/Time Out Taken: Pain Control: Lidocaine 4% Topical Solution N/A N/A Tissue Debrided: Subcutaneous, Slough N/A N/A Level: Skin/Subcutaneous Tissue N/A N/A Debridement Area (sq cm): 28.52 N/A N/A Instrument: Curette N/A N/A Bleeding: Minimum N/A N/A Hemostasis Achieved: Pressure N/A N/A Procedural Pain: 0 N/A  N/A Post Procedural Pain: 0 N/A N/A Debridement Treatment Procedure was tolerated well N/A N/A Response: Post Debridement 4.6x6.2x0.2 N/A N/A Measurements L x W x D (cm) Post Debridement Volume: 4.48 N/A N/A (cm) Periwound Skin Texture: Excoriation: No N/A N/A Induration: No Callus: No Crepitus: No Rash: No Scarring: No Periwound Skin Moisture: Maceration: No N/A N/A Dry/Scaly: No Periwound Skin Color: Erythema: Yes N/A N/A Atrophie Blanche: No Cyanosis: No Ecchymosis: No Hemosiderin Staining: No Mottled: No Pallor: No Rubor: No Erythema Location: Circumferential N/A N/A Temperature: No Abnormality N/A N/A Tenderness on Palpation: Yes N/A N/A Wound Preparation: Ulcer Cleansing: N/A N/A Rinsed/Irrigated with Saline Topical Anesthetic Applied: Other:  lidocaine 4% Procedures Performed: Debridement N/A N/A Treatment Notes Wound #1 (Right, Posterior Lower Leg) 1. Cleansed with: Clean wound with Normal Saline 2. Anesthetic Topical Lidocaine 4% cream to wound bed prior to debridement 4. Dressing Applied: Medihoney Gel 5. Secondary Dressing Applied ABD Pad Non-Adherent pad Notes [name], [name] ([id number]) kerlix wrap with ace wrap Electronic Signature(s) Signed: 03/17/2018 8:13:08 PM By: Rachel Cousins Entered By: Rachel Cousins on 03/17/2018 20:13:08 [name] ([id number]) -------------------------------------------------------------------------------- [place] Details Patient Name: [name]. Date of Service: 03/17/2018 12:30 PM Medical Record Number: [id number] Patient Account Number: [id number] Date of Birth/Sex: 06-28-1961 (57 y.o. F) Treating RN: [name] Primary Care Ferlin Fairhurst: [employer] Other Clinician: Referring Ethelda Deangelo: Collene Mares Treating Wylodean Shimmel/Extender: [name] in Treatment: 0 Active Inactive ` Abuse / Safety / Falls / Self Care Management Nursing Diagnoses: Potential for falls Goals: Patient will not experience any injury related to falls Date Initiated: 03/17/2018 Target Resolution Date: 07/16/2018 Goal Status: Active Interventions: Assess Activities of Daily Living upon admission and as needed Assess fall risk on admission and as needed Assess: immobility, friction, shearing, incontinence upon admission and as needed Notes: ` Nutrition Nursing Diagnoses: Imbalanced nutrition Potential for alteratiion in Nutrition/Potential for imbalanced nutrition Goals: Patient/caregiver agrees to and verbalizes understanding of need to use nutritional supplements and/or vitamins as prescribed Date Initiated: 03/17/2018 Target Resolution Date: 06/11/2018 Goal Status: Active Interventions: Assess patient nutrition upon admission and as needed per  policy Notes: ` Orientation to the Wound Care Program Nursing Diagnoses: Knowledge deficit related to the wound healing center program Goals: Patient/caregiver will verbalize understanding of the [place] [facility] Date Initiated: 03/17/2018 Target Resolution Date: 04/16/2018 [name], [name] ([id number]) Goal Status: Active Interventions: Provide education on orientation to the wound center Notes: ` Pain, Acute or Chronic Nursing Diagnoses: Pain, acute or chronic: actual or potential Potential alteration in comfort, pain Goals: Patient/caregiver will verbalize adequate pain control between visits Date Initiated: 03/17/2018 Target Resolution Date: 06/11/2018 Goal Status: Active Interventions: Complete pain assessment as per visit requirements Notes: ` Wound/Skin Impairment Nursing Diagnoses: Impaired tissue integrity Knowledge deficit related to ulceration/compromised skin integrity Goals: Ulcer/skin breakdown will have a volume reduction of 80% by week 12 Date Initiated: 03/17/2018 Target Resolution Date: 07/09/2018 Goal Status: Active Interventions: Assess patient/caregiver ability to perform ulcer/skin care regimen upon admission and as needed Assess ulceration(s) every visit Notes: Electronic Signature(s) Signed: 03/18/2018 11:31:29 AM By: [name] Entered By: [name] on 03/17/2018 13:39:16 [name] ([id number]) -------------------------------------------------------------------------------- Pain Assessment Details Patient Name: Rachel Docker D. Date of Service: 03/17/2018 12:30 PM Medical Record Number: [id number] Patient Account Number: [id number] Date of Birth/Sex: 12-May-1961 (57 y.o. F) Treating RN: [name] Primary Care Eldar Robitaille: [employer] Other Clinician: Referring Girolamo Lortie: Collene Mares Treating Katrinna Travieso/Extender: [name] in Treatment: 0 Active Problems Location  of Pain Severity and Description  of Pain Patient Has Paino No Site Locations Pain Management and Medication Current Pain Management: Goals for Pain Management "uncomfortable but not hurting" Electronic Signature(s) Signed: 03/17/2018 5:06:29 PM By: [name] Entered By: [name] on 03/17/2018 12:56:58 [name] ([id number]) -------------------------------------------------------------------------------- Patient/Caregiver Education Details Patient Name: [name]. Date of Service: 03/17/2018 12:30 PM Medical Record Number: [id number] Patient Account Number: [id number] Date of Birth/Gender: 09/23/1960 (57 y.o. F) Treating RN: [name] Primary Care Physician: [employer] Other Clinician: Referring Physician: Collene Mares Treating Physician/Extender: [name] in Treatment: 0 Education Assessment Education Provided To: Patient Education Topics Provided Wound Debridement: Handouts: Wound Debridement Methods: Explain/Verbal Responses: State content correctly Wound/Skin Impairment: Handouts: Caring for Your Ulcer Methods: Explain/Verbal Responses: State content correctly Electronic Signature(s) Signed: 03/18/2018 10:58:55 AM By: [name] Entered By: [name] on 03/17/2018 13:59:21 [name] ([id number]) -------------------------------------------------------------------------------- Wound Assessment Details Patient Name: Rachel Docker D. Date of Service: 03/17/2018 12:30 PM Medical Record Number: [id number] Patient Account Number: [id number] Date of Birth/Sex: May 20, 1961 (57 y.o. F) Treating RN: [name] Primary Care Rajah Tagliaferro: [employer] Other Clinician: Referring Viviano Bir: Collene Mares Treating Jocabed Cheese/Extender: [name] in Treatment: 0 Wound Status Wound Number: 1 Primary Etiology: Skin Tear Wound Location: Right Lower Leg - Posterior Wound Status: Open Wounding Event: Trauma Date Acquired: 03/03/2018 [name]  Of Treatment: 0 Clustered Wound: No Photos Photo Uploaded By: [name] on 03/17/2018 16:18:25 Wound Measurements Length: (cm) 4.6 Width: (cm) 6.2 Depth: (cm) 0.1 Area: (cm) 22.4 Volume: (cm) 2.24 % Reduction in Area: % Reduction in Volume: Epithelialization: None Tunneling: No Undermining: No Wound Description Full Thickness Without Exposed Support Classification: Structures Wound Margin: Flat and Intact Exudate Medium Amount: Exudate Type: Serous Exudate Color: amber Foul Odor After Cleansing: No Slough/Fibrino Yes Wound Bed Granulation Amount: Small (1-33%) Exposed Structure Granulation Quality: Pink Fascia Exposed: No Necrotic Amount: Large (67-100%) Fat Layer (Subcutaneous Tissue) Exposed: Yes Necrotic Quality: Eschar, Adherent Slough Tendon Exposed: No Muscle Exposed: No Joint Exposed: No Bone Exposed: No [name], [name]. ([id number]) Periwound Skin Texture Texture Color No Abnormalities Noted: No No Abnormalities Noted: No Callus: No Atrophie Blanche: No Crepitus: No Cyanosis: No Excoriation: No Ecchymosis: No Induration: No Erythema: Yes Rash: No Erythema Location: Circumferential Scarring: No Hemosiderin Staining: No Mottled: No Moisture Pallor: No No Abnormalities Noted: No Rubor: No Dry / Scaly: No Maceration: No Temperature / Pain Temperature: No Abnormality Tenderness on Palpation: Yes Wound Preparation Ulcer Cleansing: Rinsed/Irrigated with Saline Topical Anesthetic Applied: Other: lidocaine 4%, Treatment Notes Wound #1 (Right, Posterior Lower Leg) 1. Cleansed with: Clean wound with Normal Saline 2. Anesthetic Topical Lidocaine 4% cream to wound bed prior to debridement 4. Dressing Applied: Medihoney Gel 5. Secondary Dressing Applied ABD Pad Non-Adherent pad Notes kerlix wrap with ace wrap Electronic Signature(s) Signed: 03/17/2018 5:06:29 PM By: [name] Entered By: [name] on 03/17/2018  13:15:40 [name] ([id number]) -------------------------------------------------------------------------------- [place] Details Patient Name: Rachel Docker D. Date of Service: 03/17/2018 12:30 PM Medical Record Number: [id number] Patient Account Number: [id number] Date of Birth/Sex: [birth year] (57 y.o. F) Treating RN: [name] Primary Care Jeliyah Middlebrooks: [employer] Other Clinician: Referring Shaunette Gassner: Collene Mares Treating Royalty Domagala/Extender: [name] in Treatment: 0 Vital Signs Time Taken: 12:58 Temperature (F): 97.9 Height (in): 62 Pulse (bpm): 74 Source: Measured Respiratory Rate (breaths/min): 16 Weight (lbs): 223 Blood Pressure (mmHg): 155/99 Source: Measured Reference Range: 80 - 120 mg / dl Body Mass Index (BMI): 40.8 Electronic Signature(s)  Signed: 03/17/2018 5:06:29 PM By: [name] Entered By: [name] on 03/17/2018 12:58:27

## 2018-03-24 ENCOUNTER — Encounter: Payer: PRIVATE HEALTH INSURANCE | Admitting: Nurse Practitioner

## 2018-03-24 DIAGNOSIS — L97212 Non-pressure chronic ulcer of right calf with fat layer exposed: Secondary | ICD-10-CM | POA: Diagnosis not present

## 2018-03-25 NOTE — Progress Notes (Signed)
[name], [name] ([id number]) Visit Report for 03/24/2018 Chief Complaint Document Details Patient Name: [name], [name]. Date of Service: 03/24/2018 9:30 AM Medical Record Number: [id number] Patient Account Number: [id number] Date of Birth/Sex: Dec 17, 1960 (57 y.o. Female) Treating RN: Carolyne Fiscal, Debi Primary Care Provider: [employer] Other Clinician: Referring Provider: [employer] Treating Provider/Extender: Cathie Olden in Treatment: 1 Information Obtained from: Patient Chief Complaint right posterior calf ulcer Electronic Signature(s) Signed: 03/24/2018 11:10:55 AM By: Lawanda Cousins Entered By: Lawanda Cousins on 03/24/2018 11:10:54 [name] ([id number]) -------------------------------------------------------------------------------- Debridement Details Patient Name: [name]. Date of Service: 03/24/2018 9:30 AM Medical Record Number: [id number] Patient Account Number: [id number] Date of Birth/Sex: 04/21/1961 (57 y.o. Female) Treating RN: Carolyne Fiscal, Debi Primary Care Provider: [employer] Other Clinician: Referring Provider: [employer] Treating Provider/Extender: Cathie Olden in Treatment: 1 Debridement Performed for Wound #1 Right,Posterior Lower Leg Assessment: Performed By: Physician Lawanda Cousins, NP Debridement Type: Debridement Pre-procedure Verification/Time Yes - 10:10 Out Taken: Start Time: 10:10 Pain Control: Lidocaine 4% Topical Solution Total Area Debrided (L x W): 4.3 (cm) x 7 (cm) = 30.1 (cm) Tissue and other material Viable, Non-Viable, Slough, Subcutaneous, Fibrin/Exudate, Slough debrided: Level: Skin/Subcutaneous Tissue Debridement Description: Excisional Instrument: Curette Bleeding: Minimum Hemostasis Achieved: Pressure End Time: 10:15 Procedural Pain: 0 Post Procedural Pain: 0 Response to Treatment: Procedure was tolerated well Level of Consciousness: Awake and Alert Post Debridement  Measurements of Total Wound Length: (cm) 4.3 Width: (cm) 7 Depth: (cm) 0.3 Volume: (cm) 7.092 Character of Wound/Ulcer Post Debridement: Requires Further Debridement Post Procedure Diagnosis Same as Pre-procedure Electronic Signature(s) Signed: 03/24/2018 3:48:25 PM By: Alric Quan Signed: 03/24/2018 4:35:32 PM By: Lawanda Cousins Entered By: Alric Quan on 03/24/2018 10:16:35 [name] ([id number]) -------------------------------------------------------------------------------- HPI Details Patient Name: Rachel Docker D. Date of Service: 03/24/2018 9:30 AM Medical Record Number: [id number] Patient Account Number: [id number] Date of Birth/Sex: 10/24/1960 (57 y.o. Female) Treating RN: Carolyne Fiscal, Debi Primary Care Provider: [employer] Other Clinician: Referring Provider: [employer] Treating Provider/Extender: Cathie Olden in Treatment: 1 History of Present Illness HPI Description: 03/17/18-She is here for initial evaluation of the right posterior calf ulcer. She states that roughly 3 weeks ago, she was at a time at the beach, and abraded her right calf on a bicycle tire, developing a non-blistered hematoma. She states presently 3 days after that she lacerated the hematoma on a table. After returning home she went to urgent care, had Steri-Strips placed and was prescribed Cefdnir and topical Polysporin. She states the pain is improved. She denies wearing any compression therapy. she is nondiabetic and does not smoke 03/24/18-She is here for evaluation for a right posterior calf ulcer. She did not receive supplies as ordered last week secondary to insurance coverage. She has been applying medical honey and Ace wrap compression, although she admits that she is unable to apply Ace wrap at same compression as placed here in the clinic. She tolerated debridement today we will continue with same treatment plan she'll follow-up next week. She has been encouraged  to contact her insurance company regarding supply company coverage. Electronic Signature(s) Signed: 03/24/2018 12:35:17 PM By: Lawanda Cousins Previous Signature: 03/24/2018 11:11:51 AM Version By: Lawanda Cousins Entered By: Lawanda Cousins on 03/24/2018 12:35:16 [name] ([id number]) -------------------------------------------------------------------------------- Physician Orders Details Patient Name: [name]. Date of Service: 03/24/2018 9:30 AM Medical Record Number: [id number] Patient Account Number: [id number] Date of Birth/Sex: 10/28/1960 (57 y.o. Female) Treating RN: Carolyne Fiscal, Debi Primary Care Provider: [employer] Other Clinician:  Referring Provider: [employer] Treating Provider/Extender: Cathie Olden in Treatment: 1 Verbal / Phone Orders: Yes Clinician: Carolyne Fiscal, Debi Read Back and Verified: Yes Diagnosis Coding Wound Cleansing Wound #1 Right,Posterior Lower Leg o Clean wound with Normal Saline. o Cleanse wound with mild soap and water o May shower with protection. Anesthetic (add to Medication List) Wound #1 Right,Posterior Lower Leg o Topical Lidocaine 4% cream applied to wound bed prior to debridement (In Clinic Only). Primary Wound Dressing Wound #1 Right,Posterior Lower Leg o Medihoney gel Secondary Dressing Wound #1 Right,Posterior Lower Leg o ABD and Kerlix/Conform o Non-adherent pad Dressing Change Frequency Wound #1 Right,Posterior Lower Leg o Change dressing every day. Follow-up Appointments Wound #1 Right,Posterior Lower Leg o Return Appointment in 1 week. Edema Control Wound #1 Right,Posterior Lower Leg o Elevate legs to the level of the heart and pump ankles as often as possible o Other: - ace wrap Additional Orders / Instructions Wound #1 Right,Posterior Lower Leg o Increase protein intake. Patient Medications Allergies: No Known Allergies Notifications Medication Indication Start  End lidocaine DEEM, MARMOL ([id number]) Notifications Medication Indication Start End DOSE 1 - topical 4 % cream - 1 cream topical Electronic Signature(s) Signed: 03/24/2018 3:48:25 PM By: Alric Quan Signed: 03/24/2018 4:35:32 PM By: Lawanda Cousins Entered By: Alric Quan on 03/24/2018 10:20:21 [name] ([id number]) -------------------------------------------------------------------------------- Prescription 03/24/2018 Patient Name: [name] Provider: Lawanda Cousins NP Date of Birth: 09-09-1960 NPI#: [id number] Sex: Female DEA#: [id number] Phone #: [phone] License #: Patient Address: [facility] [place], [place] [address] [address], [place], [place] [address] [phone] Allergies No Known Allergies Medication Medication: Route: Strength: Form: lidocaine topical 4% cream Class: TOPICAL LOCAL ANESTHETICS Dose: Frequency / Time: Indication: 1 1 cream topical Number of Refills: Number of Units: 0 Generic Substitution: Start Date: End Date: Administered at Substitution Permitted Facility: Yes Time Administered: Time Discontinued: Note to Pharmacy: Signature(s): Date(s): Electronic Signature(s) Signed: 03/24/2018 3:48:25 PM By: Alric Quan Signed: 03/24/2018 4:35:32 PM By: Lawanda Cousins Entered By: Alric Quan on 03/24/2018 10:20:23 [name] ([id number]) Izora Ribas, Olean Ree ([id number]) --------------------------------------------------------------------------------  Problem List Details Patient Name: [name]. Date of Service: 03/24/2018 9:30 AM Medical Record Number: [id number] Patient Account Number: [id number] Date of Birth/Sex: 1960-09-11 (58 y.o. Female) Treating RN: Carolyne Fiscal, Debi Primary Care Provider: [employer] Other Clinician: Referring Provider: [employer] Treating Provider/Extender:  Cathie Olden in Treatment: 1 Active Problems ICD-10 Evaluated Encounter Code Description Active Date Today Diagnosis L97.212 Non-pressure chronic ulcer of right calf with fat layer exposed 03/17/2018 No Yes Inactive Problems Resolved Problems Electronic Signature(s) Signed: 03/24/2018 11:10:26 AM By: Lawanda Cousins Entered By: Lawanda Cousins on 03/24/2018 11:10:25 [name] ([id number]) -------------------------------------------------------------------------------- Progress Note Details Patient Name: Rachel Docker D. Date of Service: 03/24/2018 9:30 AM Medical Record Number: [id number] Patient Account Number: [id number] Date of Birth/Sex: [birth year] (57 y.o. Female) Treating RN: Carolyne Fiscal, Debi Primary Care Provider: [employer] Other Clinician: Referring Provider: [employer] Treating Provider/Extender: Cathie Olden in Treatment: 1 Subjective Chief Complaint Information obtained from Patient right posterior calf ulcer History of Present Illness (HPI) 03/17/18-She is here for initial evaluation of the right posterior calf ulcer. She states that roughly 3 weeks ago, she was at a time at the beach, and abraded her right calf on a bicycle tire, developing a non-blistered hematoma. She states presently 3 days after that she lacerated the hematoma on a table. After returning home  she went to urgent care, had Steri-Strips placed and was prescribed Cefdnir and topical Polysporin. She states the pain is improved. She denies wearing any compression therapy. she is nondiabetic and does not smoke 03/24/18-She is here for evaluation for a right posterior calf ulcer. She did not receive supplies as ordered last week secondary to insurance coverage. She has been applying medical honey and Ace wrap compression, although she admits that she is unable to apply Ace wrap at same compression as placed here in the clinic. She tolerated debridement today we will continue with  same treatment plan she'll follow-up next week. She has been encouraged to contact her insurance company regarding supply company coverage. Patient History Information obtained from Patient. Social History Former smoker - 15 years, Marital Status - Married, Alcohol Use - Rarely, Drug Use - No History, Caffeine Use - Daily. Medical And Surgical History Notes Cardiovascular MVP Oncologic skin cancers Objective Constitutional Vitals Time Taken: 9:49 AM, Height: 62 in, Weight: 223 lbs, BMI: 40.8, Temperature: 98.1 F, Pulse: 71 bpm, Respiratory Rate: 16 breaths/min, Blood Pressure: 152/74 mmHg. [name], [name]. ([id number]) Integumentary (Hair, Skin) Wound #1 status is Open. Original cause of wound was Trauma. The wound is located on the Right,Posterior Lower Leg. The wound measures 4.3cm length x 7cm width x 0.2cm depth; 23.64cm^2 area and 4.728cm^3 volume. There is Fat Layer (Subcutaneous Tissue) Exposed exposed. There is no tunneling or undermining noted. There is a medium amount of serous drainage noted. The wound margin is flat and intact. There is small (1-33%) pink granulation within the wound bed. There is a large (67-100%) amount of necrotic tissue within the wound bed including Eschar and Adherent Slough. The periwound skin appearance exhibited: Erythema. The periwound skin appearance did not exhibit: Callus, Crepitus, Excoriation, Induration, Rash, Scarring, Dry/Scaly, Maceration, Atrophie Blanche, Cyanosis, Ecchymosis, Hemosiderin Staining, Mottled, Pallor, Rubor. The surrounding wound skin color is noted with erythema which is circumferential. Periwound temperature was noted as No Abnormality. The periwound has tenderness on palpation. Assessment Active Problems ICD-10 Non-pressure chronic ulcer of right calf with fat layer exposed Procedures Wound #1 Pre-procedure diagnosis of Wound #1 is a Skin Tear located on the Right,Posterior Lower Leg . There was a  Excisional Skin/Subcutaneous Tissue Debridement with a total area of 30.1 sq cm performed by Lawanda Cousins, NP. With the following instrument(s): Curette to remove Viable and Non-Viable tissue/material. Material removed includes Subcutaneous Tissue, Slough, and Fibrin/Exudate after achieving pain control using Lidocaine 4% Topical Solution. No specimens were taken. A time out was conducted at 10:10, prior to the start of the procedure. A Minimum amount of bleeding was controlled with Pressure. The procedure was tolerated well with a pain level of 0 throughout and a pain level of 0 following the procedure. Patient s Level of Consciousness post procedure was recorded as Awake and Alert. Post Debridement Measurements: 4.3cm length x 7cm width x 0.3cm depth; 7.092cm^3 volume. Character of Wound/Ulcer Post Debridement requires further debridement. Post procedure Diagnosis Wound #1: Same as Pre-Procedure Plan Wound Cleansing: Wound #1 Right,Posterior Lower Leg: Clean wound with Normal Saline. Cleanse wound with mild soap and water May shower with protection. Anesthetic (add to Medication List): Wound #1 Right,Posterior Lower Leg: Topical Lidocaine 4% cream applied to wound bed prior to debridement (In Clinic Only). Primary Wound Dressing: Wound #1 Right,Posterior Lower Leg: [name], [name] ([id number]) Medihoney gel Secondary Dressing: Wound #1 Right,Posterior Lower Leg: ABD and Kerlix/Conform Non-adherent pad Dressing Change Frequency: Wound #1 Right,Posterior Lower Leg: Change dressing every  day. Follow-up Appointments: Wound #1 Right,Posterior Lower Leg: Return Appointment in 1 week. Edema Control: Wound #1 Right,Posterior Lower Leg: Elevate legs to the level of the heart and pump ankles as often as possible Other: - ace wrap Additional Orders / Instructions: Wound #1 Right,Posterior Lower Leg: Increase protein intake. The following medication(s) was prescribed: lidocaine  topical 4 % cream 1 1 cream topical was prescribed at facility Electronic Signature(s) Signed: 03/24/2018 12:36:08 PM By: Lawanda Cousins Entered By: Lawanda Cousins on 03/24/2018 12:36:07 [name] ([id number]) -------------------------------------------------------------------------------- ROS/PFSH Details Patient Name: [name]. Date of Service: 03/24/2018 9:30 AM Medical Record Number: [id number] Patient Account Number: [id number] Date of Birth/Sex: [birth year] (57 y.o. Female) Treating RN: Carolyne Fiscal, Debi Primary Care Provider: [employer] Other Clinician: Referring Provider: [employer] Treating Provider/Extender: Cathie Olden in Treatment: 1 Information Obtained From Patient Wound History Do you currently have one or more open woundso Yes How many open wounds do you currently haveo 1 Approximately how long have you had your woundso 3 weeks How have you been treating your wound(s) until nowo mupirocin Has your wound(s) ever healed and then re-openedo No Have you had any lab work done in the past montho No Have you tested positive for an antibiotic resistant organism (MRSA, VRE)o No Have you tested positive for osteomyelitis (bone infection)o No Have you had any tests for circulation on your legso Yes Who ordered the testo PCP Where was the test doneo [facility] Medical History: Negative for: Cataracts; Glaucoma; Optic Neuritis Ear/Nose/Mouth/Throat Medical History: Negative for: Chronic sinus problems/congestion; Middle ear problems Hematologic/Lymphatic Medical History: Negative for: Anemia; Hemophilia; Human Immunodeficiency Virus; Lymphedema; Sickle Cell Disease Respiratory Medical History: Negative for: Aspiration; Asthma; Chronic Obstructive Pulmonary Disease (COPD); Pneumothorax; Sleep Apnea; Tuberculosis Cardiovascular Medical History: Negative for: Angina; Arrhythmia; Congestive Heart Failure; Coronary Artery Disease; Deep Vein  Thrombosis; Hypertension; Hypotension; Myocardial Infarction; Peripheral Arterial Disease; Peripheral Venous Disease; Phlebitis; Vasculitis Past Medical History Notes: [facility] [name], [name] ([id number]) Medical History: Negative for: Cirrhosis ; Colitis; Crohnos; Hepatitis A; Hepatitis B; Hepatitis C Endocrine Medical History: Negative for: Type I Diabetes; Type II Diabetes Genitourinary Medical History: Negative for: End Stage Renal Disease Immunological Medical History: Negative for: Lupus Erythematosus; Raynaudos; Scleroderma Integumentary (Skin) Medical History: Negative for: History of Burn; History of pressure wounds Musculoskeletal Medical History: Negative for: Gout; Rheumatoid Arthritis; Osteoarthritis; Osteomyelitis Neurologic Medical History: Negative for: Dementia; Neuropathy; Quadriplegia; Paraplegia; Seizure Disorder Oncologic Medical History: Negative for: Received Chemotherapy; Received Radiation Past Medical History Notes: skin cancers Psychiatric Medical History: Negative for: Anorexia/bulimia; Confinement Anxiety Immunizations Pneumococcal Vaccine: Received Pneumococcal Vaccination: Yes Tetanus Vaccine: Last tetanus shot: 02/28/2018 Implantable Devices Family and Social History Former smoker - 15 years; Marital Status - Married; Alcohol Use: Rarely; Drug Use: No History; Caffeine Use: Daily; Financial Concerns: No; Food, Clothing or Shelter Needs: No; Support System Lacking: No; Transportation Concerns: No; Advanced Directives: No; Patient does not want information on Advanced Directives Physician Affirmation [name], [name] ([id number]) I have reviewed and agree with the above information. Electronic Signature(s) Signed: 03/24/2018 3:48:25 PM By: Alric Quan Signed: 03/24/2018 4:35:32 PM By: Lawanda Cousins Entered By: Lawanda Cousins on 03/24/2018 12:35:40 [name]  ([id number]) -------------------------------------------------------------------------------- SuperBill Details Patient Name: Rachel Docker D. Date of Service: 03/24/2018 Medical Record Number: [id number] Patient Account Number: [id number] Date of Birth/Sex: 10-24-1960 (57 y.o. Female) Treating RN: Carolyne Fiscal, Debi Primary Care Provider: [employer] Other Clinician: Referring Provider: [employer] Treating Provider/Extender: Cathie Olden in Treatment: 1 Diagnosis Coding  ICD-10 Codes Code Description [phone] Non-pressure chronic ulcer of right calf with fat layer exposed Facility Procedures CPT4 Code: [id number] Description: 74081 - DEB SUBQ TISSUE 20 SQ CM/< ICD-10 Diagnosis Description L97.212 Non-pressure chronic ulcer of right calf with fat layer exp Modifier: osed Quantity: 1 CPT4 Code: [id number] Description: 14970 - DEB SUBQ TISS EA ADDL 20CM ICD-10 Diagnosis Description L97.212 Non-pressure chronic ulcer of right calf with fat layer exp Modifier: osed Quantity: 1 Physician Procedures CPT4 Code: [id number] Description: 85027 - WC PHYS SUBQ TISS 20 SQ CM ICD-10 Diagnosis Description X41.287 Non-pressure chronic ulcer of right calf with fat layer expo Modifier: sed Quantity: 1 CPT4 Code: [id number] Description: 94709 - WC PHYS SUBQ TISS EA ADDL 20 CM ICD-10 Diagnosis Description L97.212 Non-pressure chronic ulcer of right calf with fat layer expo Modifier: sed Quantity: 1 Electronic Signature(s) Signed: 03/24/2018 12:38:08 PM By: Lawanda Cousins Entered By: Lawanda Cousins on 03/24/2018 12:38:08

## 2018-03-25 NOTE — Progress Notes (Signed)
[name], [name] ([id number]) Visit Report for 03/24/2018 Arrival Information Details Patient Name: [name], [name]. Date of Service: 03/24/2018 9:30 AM Medical Record Number: [id number] Patient Account Number: [id number] Date of Birth/Sex: 11/12/1960 (57 y.o. Female) Treating RN: Roger Shelter Primary Care Shadeed Colberg: [employer] Other Clinician: Referring Shawntina Diffee: [employer] Treating Caralina Nop/Extender: Cathie Olden in Treatment: 1 Visit Information History Since Last Visit All ordered tests and consults were completed: No Patient Arrived: Ambulatory Added or deleted any medications: No Arrival Time: 09:47 Any new allergies or adverse reactions: No Accompanied By: self Had a fall or experienced change in No Transfer Assistance: None activities of daily living that may affect Patient Identification Verified: Yes risk of falls: Secondary Verification Process Completed: Yes Signs or symptoms of abuse/neglect since last visito No Hospitalized since last visit: No Implantable device outside of the clinic excluding No cellular tissue based products placed in the center since last visit: Pain Present Now: No Electronic Signature(s) Signed: 03/24/2018 3:39:18 PM By: Roger Shelter Entered By: Roger Shelter on 03/24/2018 09:48:12 [name] ([id number]) -------------------------------------------------------------------------------- Encounter Discharge Information Details Patient Name: [name]. Date of Service: 03/24/2018 9:30 AM Medical Record Number: [id number] Patient Account Number: [id number] Date of Birth/Sex: Jun 25, 1961 (57 y.o. Female) Treating RN: Roger Shelter Primary Care Lillyen Schow: [employer] Other Clinician: Referring Siaosi Alter: [employer] Treating Jacinto Keil/Extender: Cathie Olden in Treatment: 1 Encounter Discharge Information Items Discharge Condition: Stable Ambulatory Status: Ambulatory Discharge  Destination: Home Transportation: Private Auto Schedule Follow-up Appointment: Yes Clinical Summary of Care: Electronic Signature(s) Signed: 03/24/2018 3:39:18 PM By: Roger Shelter Entered By: Roger Shelter on 03/24/2018 10:29:22 [name] ([id number]) -------------------------------------------------------------------------------- Lower Extremity Assessment Details Patient Name: [name]. Date of Service: 03/24/2018 9:30 AM Medical Record Number: [id number] Patient Account Number: [id number] Date of Birth/Sex: June 15, 1961 (57 y.o. Female) Treating RN: Roger Shelter Primary Care Deysha Cartier: [employer] Other Clinician: Referring Glorianna Gott: [employer] Treating Bernerd Terhune/Extender: Cathie Olden in Treatment: 1 Edema Assessment Assessed: [Left: No] [Right: No] Edema: [Left: N] [Right: o] Calf Left: Right: Point of Measurement: 30 cm From Medial Instep cm 42 cm Ankle Left: Right: Point of Measurement: 10 cm From Medial Instep cm 25.2 cm Vascular Assessment Claudication: Claudication Assessment [Right:None] Pulses: Dorsalis Pedis Palpable: [Right:Yes] Posterior Tibial Extremity colors, hair growth, and conditions: Extremity Color: [Right:Normal] Hair Growth on Extremity: [Right:No] Temperature of Extremity: [Right:Warm] Capillary Refill: [Right:< 3 seconds] Toe Nail Assessment Left: Right: Thick: No Discolored: No Deformed: No Improper Length and Hygiene: No Electronic Signature(s) Signed: 03/24/2018 3:39:18 PM By: Roger Shelter Entered By: Roger Shelter on 03/24/2018 09:58:03 [name] ([id number]) -------------------------------------------------------------------------------- Multi Wound Chart Details Patient Name: Rachel Docker D. Date of Service: 03/24/2018 9:30 AM Medical Record Number: [id number] Patient Account Number: [id number] Date of Birth/Sex: 21-Jun-1961 (56 y.o. Female) Treating RN: Carolyne Fiscal, Debi Primary  Care Yordan Martindale: [employer] Other Clinician: Referring Floreine Kingdon: [employer] Treating Charrisse Masley/Extender: Cathie Olden in Treatment: 1 Vital Signs Height(in): 66 Pulse(bpm): 54 Weight(lbs): 223 Blood Pressure(mmHg): 152/74 Body Mass Index(BMI): 41 Temperature(F): 98.1 Respiratory Rate 16 (breaths/min): Photos: [1:No Photos] [N/A:N/A] Wound Location: [1:Right Lower Leg - Posterior] [N/A:N/A] Wounding Event: [1:Trauma] [N/A:N/A] Primary Etiology: [1:Skin Tear] [N/A:N/A] Date Acquired: [1:03/03/2018] [N/A:N/A] Weeks of Treatment: [1:1] [N/A:N/A] Wound Status: [1:Open] [N/A:N/A] Measurements L x W x D [1:4.3x7x0.2] [N/A:N/A] (cm) Area (cm) : [1:23.64] [N/A:N/A] Volume (cm) : [1:4.728] [N/A:N/A] % Reduction in Area: [1:-5.50%] [N/A:N/A] % Reduction in Volume: [1:-111.10%] [N/A:N/A] Classification: [1:Full Thickness Without Exposed Support Structures] [N/A:N/A] Exudate Amount: [1:Medium] [  N/A:N/A] Exudate Type: [1:Serous] [N/A:N/A] Exudate Color: [1:amber] [N/A:N/A] Wound Margin: [1:Flat and Intact] [N/A:N/A] Granulation Amount: [1:Small (1-33%)] [N/A:N/A] Granulation Quality: [1:Pink] [N/A:N/A] Necrotic Amount: [1:Large (67-100%)] [N/A:N/A] Necrotic Tissue: [1:Eschar, Adherent Slough] [N/A:N/A] Exposed Structures: [1:Fat Layer (Subcutaneous Tissue) Exposed: Yes Fascia: No Tendon: No Muscle: No Joint: No Bone: No] [N/A:N/A] Epithelialization: [1:None] [N/A:N/A] Debridement: [1:Debridement - Excisional] [N/A:N/A] Pre-procedure [1:10:10] [N/A:N/A] Verification/Time Out Taken: Pain Control: [1:Lidocaine 4% Topical Solution] [N/A:N/A] Tissue Debrided: [1:Subcutaneous, Slough] [N/A:N/A] Level: [1:Skin/Subcutaneous Tissue] [N/A:N/A] Debridement Area (sq cm): 30.1 N/A N/A Instrument: Curette N/A N/A Bleeding: Minimum N/A N/A Hemostasis Achieved: Pressure N/A N/A Procedural Pain: 0 N/A N/A Post Procedural Pain: 0 N/A N/A Debridement Treatment Procedure was  tolerated well N/A N/A Response: Post Debridement 4.3x7x0.3 N/A N/A Measurements L x W x D (cm) Post Debridement Volume: 7.092 N/A N/A (cm) Periwound Skin Texture: Excoriation: No N/A N/A Induration: No Callus: No Crepitus: No Rash: No Scarring: No Periwound Skin Moisture: Maceration: No N/A N/A Dry/Scaly: No Periwound Skin Color: Erythema: Yes N/A N/A Atrophie Blanche: No Cyanosis: No Ecchymosis: No Hemosiderin Staining: No Mottled: No Pallor: No Rubor: No Erythema Location: Circumferential N/A N/A Temperature: No Abnormality N/A N/A Tenderness on Palpation: Yes N/A N/A Wound Preparation: Ulcer Cleansing: N/A N/A Rinsed/Irrigated with Saline Topical Anesthetic Applied: Other: lidocaine 4% Procedures Performed: Debridement N/A N/A Treatment Notes Wound #1 (Right, Posterior Lower Leg) 1. Cleansed with: Clean wound with Normal Saline 2. Anesthetic Topical Lidocaine 4% cream to wound bed prior to debridement 4. Dressing Applied: Medihoney Gel 5. Secondary Dressing Applied Non-Adherent pad Notes kerlix wrap with ace wrap Electronic Signature(s) Signed: 03/24/2018 11:10:43 AM By: Sampson Goon, Faythe D. ([id number]) Entered By: Lawanda Cousins on 03/24/2018 11:10:43 [name] ([id number]) -------------------------------------------------------------------------------- [place] Details Patient Name: [name]. Date of Service: 03/24/2018 9:30 AM Medical Record Number: [id number] Patient Account Number: [id number] Date of Birth/Sex: May 29, 1961 (57 y.o. Female) Treating RN: Carolyne Fiscal, Debi Primary Care Nailyn Dearinger: [employer] Other Clinician: Referring Juanmanuel Marohl: [employer] Treating Zabria Liss/Extender: Cathie Olden in Treatment: 1 Active Inactive ` Abuse / Safety / Falls / Self Care Management Nursing Diagnoses: Potential for falls Goals: Patient will not experience any injury related to falls Date  Initiated: 03/17/2018 Target Resolution Date: 07/16/2018 Goal Status: Active Interventions: Assess Activities of Daily Living upon admission and as needed Assess fall risk on admission and as needed Assess: immobility, friction, shearing, incontinence upon admission and as needed Notes: ` Nutrition Nursing Diagnoses: Imbalanced nutrition Potential for alteratiion in Nutrition/Potential for imbalanced nutrition Goals: Patient/caregiver agrees to and verbalizes understanding of need to use nutritional supplements and/or vitamins as prescribed Date Initiated: 03/17/2018 Target Resolution Date: 06/11/2018 Goal Status: Active Interventions: Assess patient nutrition upon admission and as needed per policy Notes: ` Orientation to the Wound Care Program Nursing Diagnoses: Knowledge deficit related to the wound healing center program Goals: Patient/caregiver will verbalize understanding of the [facility] Date Initiated: 03/17/2018 Target Resolution Date: 04/16/2018 [name], [name] ([id number]) Goal Status: Active Interventions: Provide education on orientation to the wound center Notes: ` Pain, Acute or Chronic Nursing Diagnoses: Pain, acute or chronic: actual or potential Potential alteration in comfort, pain Goals: Patient/caregiver will verbalize adequate pain control between visits Date Initiated: 03/17/2018 Target Resolution Date: 06/11/2018 Goal Status: Active Interventions: Complete pain assessment as per visit requirements Notes: ` Wound/Skin Impairment Nursing Diagnoses: Impaired tissue integrity Knowledge deficit related to ulceration/compromised skin integrity Goals: Ulcer/skin breakdown will have a volume reduction of 80% by week  12 Date Initiated: 03/17/2018 Target Resolution Date: 07/09/2018 Goal Status: Active Interventions: Assess patient/caregiver ability to perform ulcer/skin care regimen upon admission and as needed Assess ulceration(s)  every visit Notes: Electronic Signature(s) Signed: 03/24/2018 3:48:25 PM By: Alric Quan Entered By: Alric Quan on 03/24/2018 10:11:00 [name] ([id number]) -------------------------------------------------------------------------------- Pain Assessment Details Patient Name: Rachel Docker D. Date of Service: 03/24/2018 9:30 AM Medical Record Number: [id number] Patient Account Number: [id number] Date of Birth/Sex: 02-Jun-1961 (57 y.o. Female) Treating RN: Roger Shelter Primary Care Arlie Posch: [employer] Other Clinician: Referring Micheale Schlack: [employer] Treating Hebah Bogosian/Extender: Cathie Olden in Treatment: 1 Active Problems Location of Pain Severity and Description of Pain Patient Has Paino No Site Locations Pain Management and Medication Current Pain Management: Electronic Signature(s) Signed: 03/24/2018 3:39:18 PM By: Roger Shelter Entered By: Roger Shelter on 03/24/2018 09:48:18 [name] ([id number]) -------------------------------------------------------------------------------- Patient/Caregiver Education Details Patient Name: [name]. Date of Service: 03/24/2018 9:30 AM Medical Record Number: [id number] Patient Account Number: [id number] Date of Birth/Gender: 1961/07/17 (57 y.o. Female) Treating RN: Roger Shelter Primary Care Physician: [employer] Other Clinician: Referring Physician: [employer] Treating Physician/Extender: Cathie Olden in Treatment: 1 Education Assessment Education Provided To: Patient Education Topics Provided Wound Debridement: Handouts: Wound Debridement Methods: Explain/Verbal Responses: State content correctly Wound/Skin Impairment: Handouts: Caring for Your Ulcer Methods: Explain/Verbal Responses: State content correctly Electronic Signature(s) Signed: 03/24/2018 3:39:18 PM By: Roger Shelter Entered By: Roger Shelter on 03/24/2018 10:29:40 [name] ([id number]) -------------------------------------------------------------------------------- Wound Assessment Details Patient Name: Rachel Docker D. Date of Service: 03/24/2018 9:30 AM Medical Record Number: [id number] Patient Account Number: [id number] Date of Birth/Sex: 01-16-1961 (57 y.o. Female) Treating RN: Roger Shelter Primary Care Cleveland Paiz: [employer] Other Clinician: Referring Fayette [name]: [employer] Treating Ferlin Fairhurst/Extender: Cathie Olden in Treatment: 1 Wound Status Wound Number: 1 Primary Etiology: Skin Tear Wound Location: Right Lower Leg - Posterior Wound Status: Open Wounding Event: Trauma Date Acquired: 03/03/2018 Weeks Of Treatment: 1 Clustered Wound: No Photos Photo Uploaded By: Roger Shelter on 03/24/2018 15:46:38 Wound Measurements Length: (cm) 4.3 Width: (cm) 7 Depth: (cm) 0.2 Area: (cm) 23.64 Volume: (cm) 4.728 % Reduction in Area: -5.5% % Reduction in Volume: -111.1% Epithelialization: None Tunneling: No Undermining: No Wound Description Full Thickness Without Exposed Support Classification: Structures Wound Margin: Flat and Intact Exudate Medium Amount: Exudate Type: Serous Exudate Color: amber Foul Odor After Cleansing: No Slough/Fibrino Yes Wound Bed Granulation Amount: Small (1-33%) Exposed Structure Granulation Quality: Pink Fascia Exposed: No Necrotic Amount: Large (67-100%) Fat Layer (Subcutaneous Tissue) Exposed: Yes Necrotic Quality: Eschar, Adherent Slough Tendon Exposed: No Muscle Exposed: No Joint Exposed: No Bone Exposed: No [name], [name]. ([id number]) Periwound Skin Texture Texture Color No Abnormalities Noted: No No Abnormalities Noted: No Callus: No Atrophie Blanche: No Crepitus: No Cyanosis: No Excoriation: No Ecchymosis: No Induration: No Erythema: Yes Rash: No Erythema Location: Circumferential Scarring: No Hemosiderin Staining: No Mottled: No Moisture Pallor: No No  Abnormalities Noted: No Rubor: No Dry / Scaly: No Maceration: No Temperature / Pain Temperature: No Abnormality Tenderness on Palpation: Yes Wound Preparation Ulcer Cleansing: Rinsed/Irrigated with Saline Topical Anesthetic Applied: Other: lidocaine 4%, Treatment Notes Wound #1 (Right, Posterior Lower Leg) 1. Cleansed with: Clean wound with Normal Saline 2. Anesthetic Topical Lidocaine 4% cream to wound bed prior to debridement 4. Dressing Applied: Medihoney Gel 5. Secondary Dressing Applied Non-Adherent pad Notes kerlix wrap with ace wrap Electronic Signature(s) Signed: 03/24/2018 3:39:18 PM By: Roger Shelter Entered By: Roger Shelter on 03/24/2018 09:55:55 Rachel Docker  D. ([id number]) -------------------------------------------------------------------------------- Vitals Details Patient Name: [name], [name]. Date of Service: 03/24/2018 9:30 AM Medical Record Number: [id number] Patient Account Number: [id number] Date of Birth/Sex: 02-Dec-1960 (57 y.o. Female) Treating RN: Roger Shelter Primary Care Meriem Lemieux: [employer] Other Clinician: Referring Charlee Squibb: [employer] Treating Duel Conrad/Extender: Cathie Olden in Treatment: 1 Vital Signs Time Taken: 09:49 Temperature (F): 98.1 Height (in): 62 Pulse (bpm): 71 Weight (lbs): 223 Respiratory Rate (breaths/min): 16 Body Mass Index (BMI): 40.8 Blood Pressure (mmHg): 152/74 Reference Range: 80 - 120 mg / dl Electronic Signature(s) Signed: 03/24/2018 3:39:18 PM By: Roger Shelter Entered By: Roger Shelter on 03/24/2018 09:50:50

## 2018-03-31 ENCOUNTER — Encounter: Payer: PRIVATE HEALTH INSURANCE | Admitting: Nurse Practitioner

## 2018-03-31 DIAGNOSIS — L97212 Non-pressure chronic ulcer of right calf with fat layer exposed: Secondary | ICD-10-CM | POA: Diagnosis not present

## 2018-04-07 ENCOUNTER — Encounter: Payer: PRIVATE HEALTH INSURANCE | Attending: Nurse Practitioner | Admitting: Nurse Practitioner

## 2018-04-07 DIAGNOSIS — Z85828 Personal history of other malignant neoplasm of skin: Secondary | ICD-10-CM | POA: Diagnosis not present

## 2018-04-07 DIAGNOSIS — Z87891 Personal history of nicotine dependence: Secondary | ICD-10-CM | POA: Insufficient documentation

## 2018-04-07 DIAGNOSIS — L97212 Non-pressure chronic ulcer of right calf with fat layer exposed: Secondary | ICD-10-CM | POA: Insufficient documentation
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
## 2018-04-20 NOTE — Progress Notes (Signed)
ALEE, GRESSMAN ([id number]) Visit Report for 04/07/2018 Arrival Information Details Patient Name: [name], [name]. Date of Service: 04/07/2018 1:30 PM Medical Record Number: [id number] Patient Account Number: [id number] Date of Birth/Sex: [birth year] (57 y.o. F) Treating RN: Cornell Barman Primary Care Rickelle Sylvestre: [employer] Other Clinician: Referring Nykeria Mealing: [employer] Treating Yaser Harvill/Extender: Cathie Olden in Treatment: 3 Visit Information History Since Last Visit Added or deleted any medications: No Patient Arrived: Ambulatory Any new allergies or adverse reactions: No Arrival Time: 13:35 Had a fall or experienced change in No Accompanied By: self activities of daily living that may affect Transfer Assistance: None risk of falls: Patient Identification Verified: Yes Signs or symptoms of abuse/neglect since last visito No Secondary Verification Process Completed: Yes Hospitalized since last visit: No Implantable device outside of the clinic excluding No cellular tissue based products placed in the center since last visit: Has Dressing in Place as Prescribed: Yes Pain Present Now: No Electronic Signature(s) Signed: 04/08/2018 6:17:48 PM By: Gretta Cool, BSN, RN, CWS, Kim RN, BSN Entered By: Gretta Cool, BSN, RN, CWS, Kim on 04/07/2018 13:37:08 [name] ([id number]) -------------------------------------------------------------------------------- Encounter Discharge Information Details Patient Name: [name]. Date of Service: 04/07/2018 1:30 PM Medical Record Number: [id number] Patient Account Number: [id number] Date of Birth/Sex: 04-14-1961 (57 y.o. F) Treating RN: Roger Shelter Primary Care Margarita Bobrowski: [employer] Other Clinician: Referring Vikas Wegmann: [employer] Treating Favian Kittleson/Extender: Cathie Olden in Treatment: 3 Encounter Discharge Information Items Discharge Condition: Stable Ambulatory Status: Ambulatory Discharge Destination:  Home Transportation: Private Auto Schedule Follow-up Appointment: Yes Clinical Summary of Care: Electronic Signature(s) Signed: 04/08/2018 4:54:12 PM By: Roger Shelter Entered By: Roger Shelter on 04/07/2018 14:17:01 [name] ([id number]) -------------------------------------------------------------------------------- Lower Extremity Assessment Details Patient Name: [name]. Date of Service: 04/07/2018 1:30 PM Medical Record Number: [id number] Patient Account Number: [id number] Date of Birth/Sex: [birth year] (57 y.o. F) Treating RN: Cornell Barman Primary Care Kanda Deluna: [employer] Other Clinician: Referring Davidson Palmieri: [employer] Treating Keundra Petrucelli/Extender: Cathie Olden in Treatment: 3 Vascular Assessment Claudication: Claudication Assessment [Right:None] Pulses: Dorsalis Pedis Palpable: [Right:Yes] Posterior Tibial Extremity colors, hair growth, and conditions: Extremity Color: [Right:Normal] Hair Growth on Extremity: [Right:Yes] Temperature of Extremity: [Right:Warm] Capillary Refill: [Right:< 3 seconds] Toe Nail Assessment Left: Right: Thick: No Discolored: No Deformed: No Improper Length and Hygiene: No Electronic Signature(s) Signed: 04/08/2018 6:17:48 PM By: Gretta Cool, BSN, RN, CWS, Kim RN, BSN Entered By: Gretta Cool, BSN, RN, CWS, Kim on 04/07/2018 13:43:12 [name] ([id number]) -------------------------------------------------------------------------------- Multi Wound Chart Details Patient Name: Rachel Docker D. Date of Service: 04/07/2018 1:30 PM Medical Record Number: [id number] Patient Account Number: [id number] Date of Birth/Sex: Jun 07, 1961 (57 y.o. F) Treating RN: Ahmed Prima Primary Care Aster Eckrich: [employer] Other Clinician: Referring Lylianna Fraiser: [employer] Treating Thoams Siefert/Extender: Cathie Olden in Treatment: 3 Vital Signs Height(in): 14 Pulse(bpm): 93 Weight(lbs): 223 Blood Pressure(mmHg):  149/77 Body Mass Index(BMI): 41 Temperature(F): 98.2 Respiratory Rate 16 (breaths/min): Photos: [1:No Photos] [N/A:N/A] Wound Location: [1:Right Lower Leg - Posterior] [N/A:N/A] Wounding Event: [1:Trauma] [N/A:N/A] Primary Etiology: [1:Skin Tear] [N/A:N/A] Date Acquired: [1:03/03/2018] [N/A:N/A] Weeks of Treatment: [1:3] [N/A:N/A] Wound Status: [1:Open] [N/A:N/A] Measurements L x W x D [1:4.2x5x0.1] [N/A:N/A] (cm) Area (cm) : [1:16.493] [N/A:N/A] Volume (cm) : [1:1.649] [N/A:N/A] % Reduction in Area: [1:26.40%] [N/A:N/A] % Reduction in Volume: [1:26.40%] [N/A:N/A] Classification: [1:Full Thickness With Exposed Support Structures] [N/A:N/A] Exudate Amount: [1:Large] [N/A:N/A] Exudate Type: [1:Serous] [N/A:N/A] Exudate Color: [1:amber] [N/A:N/A] Wound Margin: [1:Flat and Intact] [N/A:N/A] Granulation Amount: [1:Small (1-33%)] [N/A:N/A] Granulation Quality: [1:Red, Friable] [N/A:N/A]  Necrotic Amount: [1:Large (67-100%)] [N/A:N/A] Exposed Structures: [1:Fat Layer (Subcutaneous Tissue) Exposed: Yes Fascia: No Tendon: No Muscle: No Joint: No Bone: No] [N/A:N/A] Epithelialization: [1:Small (1-33%)] [N/A:N/A] Periwound Skin Texture: [1:Excoriation: No Induration: No Callus: No Crepitus: No Rash: No Scarring: No] [N/A:N/A] Periwound Skin Moisture: [N/A:N/A] Maceration: Yes Dry/Scaly: No Periwound Skin Color: Erythema: Yes N/A N/A Atrophie Blanche: No Cyanosis: No Ecchymosis: No Hemosiderin Staining: No Mottled: No Pallor: No Rubor: No Erythema Location: Circumferential N/A N/A Temperature: No Abnormality N/A N/A Tenderness on Palpation: Yes N/A N/A Wound Preparation: Ulcer Cleansing: N/A N/A Rinsed/Irrigated with Saline Topical Anesthetic Applied: Other: lidocaine 4% Treatment Notes Electronic Signature(s) Signed: 04/11/2018 4:59:33 PM By: Alric Quan Entered By: Alric Quan on 04/07/2018 14:02:05 [name]  ([id number]) -------------------------------------------------------------------------------- [place] Details Patient Name: [name]. Date of Service: 04/07/2018 1:30 PM Medical Record Number: [id number] Patient Account Number: [id number] Date of Birth/Sex: 1961/04/16 (57 y.o. F) Treating RN: Ahmed Prima Primary Care Xzayvier Fagin: [employer] Other Clinician: Referring Imogean Ciampa: [employer] Treating Rennae Ferraiolo/Extender: Cathie Olden in Treatment: 3 Active Inactive ` Abuse / Safety / Falls / Self Care Management Nursing Diagnoses: Potential for falls Goals: Patient will not experience any injury related to falls Date Initiated: 03/17/2018 Target Resolution Date: 07/16/2018 Goal Status: Active Interventions: Assess Activities of Daily Living upon admission and as needed Assess fall risk on admission and as needed Assess: immobility, friction, shearing, incontinence upon admission and as needed Notes: ` Nutrition Nursing Diagnoses: Imbalanced nutrition Potential for alteratiion in Nutrition/Potential for imbalanced nutrition Goals: Patient/caregiver agrees to and verbalizes understanding of need to use nutritional supplements and/or vitamins as prescribed Date Initiated: 03/17/2018 Target Resolution Date: 06/11/2018 Goal Status: Active Interventions: Assess patient nutrition upon admission and as needed per policy Notes: ` Orientation to the Wound Care Program Nursing Diagnoses: Knowledge deficit related to the wound healing center program Goals: Patient/caregiver will verbalize understanding of the [place] Program Date Initiated: 03/17/2018 Target Resolution Date: 04/16/2018 [name], [name] ([id number]) Goal Status: Active Interventions: Provide education on orientation to the wound center Notes: ` Pain, Acute or Chronic Nursing Diagnoses: Pain, acute or chronic: actual or potential Potential alteration in comfort,  pain Goals: Patient/caregiver will verbalize adequate pain control between visits Date Initiated: 03/17/2018 Target Resolution Date: 06/11/2018 Goal Status: Active Interventions: Complete pain assessment as per visit requirements Notes: ` Wound/Skin Impairment Nursing Diagnoses: Impaired tissue integrity Knowledge deficit related to ulceration/compromised skin integrity Goals: Ulcer/skin breakdown will have a volume reduction of 80% by week 12 Date Initiated: 03/17/2018 Target Resolution Date: 07/09/2018 Goal Status: Active Interventions: Assess patient/caregiver ability to perform ulcer/skin care regimen upon admission and as needed Assess ulceration(s) every visit Notes: Electronic Signature(s) Signed: 04/11/2018 4:59:33 PM By: Alric Quan Entered By: Alric Quan on 04/07/2018 14:01:50 [name] ([id number]) -------------------------------------------------------------------------------- Pain Assessment Details Patient Name: Rachel Docker D. Date of Service: 04/07/2018 1:30 PM Medical Record Number: [id number] Patient Account Number: [id number] Date of Birth/Sex: [birth year] (57 y.o. F) Treating RN: Cornell Barman Primary Care Flo Berroa: [employer] Other Clinician: Referring Danaysha Kirn: [employer] Treating Cierrah Dace/Extender: Cathie Olden in Treatment: 3 Active Problems Location of Pain Severity and Description of Pain Patient Has Paino No Site Locations Pain Management and Medication Current Pain Management: Electronic Signature(s) Signed: 04/08/2018 6:17:48 PM By: Gretta Cool, BSN, RN, CWS, Kim RN, BSN Entered By: Gretta Cool, BSN, RN, CWS, Kim on 04/07/2018 13:37:14 [name] ([id number]) -------------------------------------------------------------------------------- Patient/Caregiver Education Details Patient Name: [name]. Date of Service: 04/07/2018 1:30 PM  Medical Record Number: [id number] Patient Account Number: [id number] Date of  Birth/Gender: 04/07/1961 (57 y.o. F) Treating RN: Roger Shelter Primary Care Physician: [employer] Other Clinician: Referring Physician: [employer] Treating Physician/Extender: Cathie Olden in Treatment: 3 Education Assessment Education Provided To: Patient Education Topics Provided Wound Debridement: Handouts: Wound Debridement Methods: Explain/Verbal Responses: State content correctly Wound/Skin Impairment: Handouts: Caring for Your Ulcer Methods: Explain/Verbal Responses: State content correctly Electronic Signature(s) Signed: 04/08/2018 4:54:12 PM By: Roger Shelter Entered By: Roger Shelter on 04/07/2018 14:17:18 [name] ([id number]) -------------------------------------------------------------------------------- Wound Assessment Details Patient Name: Rachel Docker D. Date of Service: 04/07/2018 1:30 PM Medical Record Number: [id number] Patient Account Number: [id number] Date of Birth/Sex: [birth year] (57 y.o. F) Treating RN: Cornell Barman Primary Care Santoria Chason: [employer] Other Clinician: Referring Sharada Albornoz: [employer] Treating Duey Liller/Extender: Cathie Olden in Treatment: 3 Wound Status Wound Number: 1 Primary Etiology: Skin Tear Wound Location: Right Lower Leg - Posterior Wound Status: Open Wounding Event: Trauma Date Acquired: 03/03/2018 Weeks Of Treatment: 3 Clustered Wound: No Wound Measurements Length: (cm) 4.2 Width: (cm) 5 Depth: (cm) 0.1 Area: (cm) 16.493 Volume: (cm) 1.649 % Reduction in Area: 26.4% % Reduction in Volume: 26.4% Epithelialization: Small (1-33%) Tunneling: No Undermining: No Wound Description Full Thickness With Exposed Support Foul O Classification: Structures Slough Wound Margin: Flat and Intact Exudate Large Amount: Exudate Type: Serous Exudate Color: amber dor After Cleansing: No /Fibrino Yes Wound Bed Granulation Amount: Small (1-33%) Exposed Structure Granulation  Quality: Red, Friable Fascia Exposed: No Necrotic Amount: Large (67-100%) Fat Layer (Subcutaneous Tissue) Exposed: Yes Necrotic Quality: Adherent Slough Tendon Exposed: No Muscle Exposed: No Joint Exposed: No Bone Exposed: No Periwound Skin Texture Texture Color No Abnormalities Noted: No No Abnormalities Noted: No Callus: No Atrophie Blanche: No Crepitus: No Cyanosis: No Excoriation: No Ecchymosis: No Induration: No Erythema: Yes Rash: No Erythema Location: Circumferential Scarring: No Hemosiderin Staining: No Mottled: No Moisture Pallor: No No Abnormalities Noted: No Rubor: No Dry / Scaly: No [name], [name]. ([id number]) Maceration: Yes Temperature / Pain Temperature: No Abnormality Tenderness on Palpation: Yes Wound Preparation Ulcer Cleansing: Rinsed/Irrigated with Saline Topical Anesthetic Applied: Other: lidocaine 4%, Treatment Notes Wound #1 (Right, Posterior Lower Leg) 1. Cleansed with: Clean wound with Normal Saline 2. Anesthetic Topical Lidocaine 4% cream to wound bed prior to debridement 4. Dressing Applied: Medihoney Gel 5. Secondary Dressing Applied Bordered Foam Dressing Electronic Signature(s) Signed: 04/08/2018 6:17:48 PM By: Gretta Cool, BSN, RN, CWS, Kim RN, BSN Entered By: Gretta Cool, BSN, RN, CWS, Kim on 04/07/2018 13:40:23 [name] ([id number]) -------------------------------------------------------------------------------- [place] Details Patient Name: [name]. Date of Service: 04/07/2018 1:30 PM Medical Record Number: [id number] Patient Account Number: [id number] Date of Birth/Sex: Sep 01, 1961 (57 y.o. F) Treating RN: Cornell Barman Primary Care Madline Oesterling: [employer] Other Clinician: Referring Kamil Hanigan: [employer] Treating Arland Usery/Extender: Cathie Olden in Treatment: 3 Vital Signs Time Taken: 13:37 Temperature (F): 98.2 Height (in): 62 Pulse (bpm): 73 Weight (lbs): 223 Respiratory Rate (breaths/min):  16 Body Mass Index (BMI): 40.8 Blood Pressure (mmHg): 149/77 Reference Range: 80 - 120 mg / dl Electronic Signature(s) Signed: 04/08/2018 6:17:48 PM By: Gretta Cool, BSN, RN, CWS, Kim RN, BSN Entered By: Gretta Cool, BSN, RN, CWS, Kim on 04/07/2018 13:38:33

## 2018-04-21 ENCOUNTER — Ambulatory Visit: Payer: PRIVATE HEALTH INSURANCE | Admitting: Nurse Practitioner

## 2018-04-28 ENCOUNTER — Encounter: Payer: PRIVATE HEALTH INSURANCE | Admitting: Nurse Practitioner

## 2018-04-28 DIAGNOSIS — L97212 Non-pressure chronic ulcer of right calf with fat layer exposed: Secondary | ICD-10-CM | POA: Diagnosis not present

## 2018-05-06 ENCOUNTER — Emergency Department (HOSPITAL_COMMUNITY)
Admission: EM | Admit: 2018-05-06 | Discharge: 2018-05-06 | Disposition: A | Discharge status: Home / Self Care | Payer: PRIVATE HEALTH INSURANCE | Attending: Emergency Medicine | Admitting: Emergency Medicine

## 2018-05-06 ENCOUNTER — Other Ambulatory Visit: Payer: Self-pay

## 2018-05-06 ENCOUNTER — Encounter (HOSPITAL_COMMUNITY): Payer: Self-pay | Admitting: Emergency Medicine

## 2018-05-06 ENCOUNTER — Emergency Department (HOSPITAL_COMMUNITY): Payer: PRIVATE HEALTH INSURANCE

## 2018-05-06 DIAGNOSIS — Y998 Other external cause status: Secondary | ICD-10-CM | POA: Diagnosis not present

## 2018-05-06 DIAGNOSIS — Y939 Activity, unspecified: Secondary | ICD-10-CM | POA: Insufficient documentation

## 2018-05-06 DIAGNOSIS — Y92009 Unspecified place in unspecified non-institutional (private) residence as the place of occurrence of the external cause: Secondary | ICD-10-CM | POA: Insufficient documentation

## 2018-05-06 DIAGNOSIS — W5501XA Bitten by cat, initial encounter: Secondary | ICD-10-CM | POA: Insufficient documentation

## 2018-05-06 DIAGNOSIS — S61451A Open bite of right hand, initial encounter: Secondary | ICD-10-CM | POA: Insufficient documentation

## 2018-05-06 DIAGNOSIS — Z87891 Personal history of nicotine dependence: Secondary | ICD-10-CM | POA: Insufficient documentation

## 2018-05-06 DIAGNOSIS — Z79899 Other long term (current) drug therapy: Secondary | ICD-10-CM | POA: Diagnosis not present

## 2018-05-06 MED ORDER — AMOXICILLIN-POT CLAVULANATE 875-125 MG PO TABS
1.0000 | ORAL_TABLET | Freq: Once | ORAL | Status: AC
  Administered 2018-05-06: 1 via ORAL
  Filled 2018-05-06: qty 1

## 2018-05-06 MED ORDER — AMOXICILLIN-POT CLAVULANATE 875-125 MG PO TABS: 1.0000 | ORAL_TABLET | Freq: Two times a day (BID) | ORAL | 0 refills | Status: DC

## 2018-05-06 NOTE — ED Provider Notes (Signed)
[facility] EMERGENCY DEPARTMENT Provider Note   CSN: [id number] Arrival date & time: 05/06/18  1241     History   Chief Complaint Chief Complaint  Patient presents with  . Animal Bite    HPI VIA ROSADO is a 57 y.o. female.  The history is provided by the patient.  Animal Bite  Contact animal:  Cat Location:  Hand Hand injury location:  R hand Time since incident:  1 hour Pain details:    Quality:  Sore (bite to right wrist, claw wounds to right lateral leg)   Severity:  Mild   Timing:  Constant   Progression:  Unchanged Incident location:  Home (Pt was bit by a neighborhood cat that has been fighting with her cat) Provoked: provoked   Notifications:  Animal control Animal's rabies vaccination status:  Up to date Animal in possession: yes   Tetanus status:  Up to date Relieved by:  None tried Ineffective treatments:  None tried Associated symptoms: swelling   Associated symptoms: no fever and no numbness   Associated symptoms comment:  Small area of swelling between bite marks on wrist. Pt describes the cat's mouth squeezed this section of skin while biting.   Past Medical History:  Diagnosis Date  . Arthritis   . 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 DDD (degenerative disc disease), lumbar   . GERD (gastroesophageal reflux disease)    Uses OTC medication as needed  . Mitral valve prolapse   . Pneumonia    many many years ago  . Prolapse of mitral valve   . Scoliosis     Patient Active Problem List   Diagnosis Date Noted  . Spinal stenosis 10/22/2016  . Hip pain 11/29/2012  . Lumbago 11/29/2012  . Osteoarthritis 05/10/2012  . S/P cholecystectomy 05/10/2012  . GERD (gastroesophageal reflux disease) 05/10/2012    Past Surgical History:  Procedure Laterality Date  . ANTERIOR LAT LUMBAR FUSION Left 10/22/2016   Procedure: Anterior Lateral Lumbar Fusion - Lumbar two - lumbar three, with Lateral plate;  Surgeon: Kary Kos, MD;  Location: [facility];   Service: Neurosurgery;  Laterality: Left;  Anterior Lateral Lumbar Fusion - Lumbar two - lumbar three, with Lateral plate  . CHOLECYSTECTOMY  2009  . CO2 LASER APPLICATION N/A 80/99/8338   Procedure: CO2 LASER APPLICATION;  Surgeon: Gus Height, MD;  Location: [facility];  Service: Gynecology;  Laterality: N/A;  . COLONOSCOPY W/ POLYPECTOMY    . LEG SURGERY  1983   dicron artery  . NECK SURGERY  2011   fusion  . 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 OPEN REDUCTION INTERNAL FIXATION (ORIF) METACARPAL Right 08/12/2017   Procedure: OPEN REDUCTION INTERNAL FIXATION (ORIF) 5th METACARPAL fracture;  Surgeon: Roseanne Kaufman, MD;  Location: [facility];  Service: Orthopedics;  Laterality: Right;  60 mins  . SPINAL FIXATION SURGERY  1973   age 36 - sclosis  . TOTAL HIP ARTHROPLASTY Right 2011  . UPPER GASTROINTESTINAL ENDOSCOPY    . VULVECTOMY N/A 09/18/2015   Procedure: WIDE EXCISION VULVECTOMY;  Surgeon: Vanessa Kick, MD;  Location: [facility];  Service: Gynecology;  Laterality: N/A;     OB History   None      Home Medications    Prior to Admission medications   Medication Sig Start Date End Date Taking? Authorizing Provider  amoxicillin-clavulanate (AUGMENTIN) 875-125 MG tablet Take 1 tablet by mouth every 12 (  twelve) hours. 05/06/18   Evalee Jefferson, PA-C  Cimetidine (TAGAMET PO) Take 1 tablet by mouth daily as needed (indigestion).    [provider]  cyclobenzaprine (FLEXERIL) 10 MG tablet Take 1 tablet (10 mg total) by mouth 3 (three) times daily as needed for muscle spasms. 10/24/16   Kary Kos, MD  diphenhydrAMINE (BENADRYL) 25 MG tablet Take 25 mg by mouth at bedtime.    [provider]  estrogens-methylTEST 1.25-2.5 MG TABS tablet Take 1 tablet by mouth daily.    [provider]  HYDROcodone-acetaminophen (NORCO) 5-325 MG tablet Take 2 tablets by mouth every 4 (four) hours as needed for moderate pain. 08/12/17   Roseanne Kaufman, MD    HYDROcodone-acetaminophen (NORCO/VICODIN) 5-325 MG tablet Take 1-2 tablets by mouth every 4 (four) hours as needed for severe pain. 08/10/17   Joy, Shawn C, PA-C  medroxyPROGESTERone (PROVERA) 2.5 MG tablet Take 2.5 mg by mouth daily.    [provider]  meloxicam (MOBIC) 15 MG tablet Take 15 mg by mouth daily.    [provider]  temazepam (RESTORIL) 15 MG capsule Take 15 mg by mouth at bedtime as needed for sleep.    [provider]  traMADol (ULTRAM) 50 MG tablet Take 100 mg by mouth 2 (two) times daily as needed for moderate pain.     [provider]    Family History Family History  Problem Relation Age of Onset  . Diabetes Unknown   . Heart disease Unknown   . COPD Mother   . Diabetes Father   . Pneumonia Father   . Alcoholism Father   . Colon cancer Neg Hx     Social History Social History   Tobacco Use  . Smoking status: Former Smoker    Packs/day: 1.00    Years: 20.00    Pack years: 20.00    Types: Cigarettes    Last attempt to quit: 09/07/2002    Years since quitting: 15.6  . Smokeless tobacco: Never Used  Substance Use Topics  . Alcohol use: No    Comment: occ  . Drug use: No     Allergies   Patient has no known allergies.   Review of Systems Review of Systems  Constitutional: Negative for fever.  Musculoskeletal: Positive for arthralgias. Negative for joint swelling and myalgias.  Skin: Positive for wound. Negative for color change.  Neurological: Negative for weakness and numbness.     Physical Exam Updated Vital Signs BP (!) 149/69 (BP Location: Right Arm)   Pulse 86   Temp 98 F (36.7 C) (Oral)   Resp 16   Ht 5\' 2"  (1.575 m)   Wt 96.6 kg   SpO2 97%   BMI 38.96 kg/m   Physical Exam  Constitutional: She is oriented to person, place, and time. She appears well-developed and well-nourished.  HENT:  Head: Normocephalic.  Cardiovascular: Normal rate.  Pulmonary/Chest: Effort normal.  Musculoskeletal: She  exhibits tenderness.  Mild soft edema right dorsal ulnar side of hand between punctures. Deep appears puncture at the wrist, superficial appearing puncture proximal hand along the 5th metacarpal.  Neurological: She is alert and oriented to person, place, and time. No sensory deficit.  Skin: Laceration noted.     ED Treatments / Results  Labs (all labs ordered are listed, but only abnormal results are displayed) Labs Reviewed - No data to display  EKG None  Radiology Dg Wrist Complete Right  Result Date: 05/06/2018 CLINICAL DATA:  Fifth metacarpal pain following  cat bite, initial encounter EXAM: RIGHT WRIST - COMPLETE 3+ VIEW COMPARISON:  08/10/2017 FINDINGS: Previously seen fifth metacarpal fracture with surgical fixation is noted. No acute fracture or dislocation is seen. No radiopaque foreign body is noted. No other focal abnormality is seen. IMPRESSION: Healed fifth metacarpal fracture with surgical screws in place. No acute abnormality noted. Electronically Signed   By: Inez Catalina M.D.   On: 05/06/2018 14:32    Procedures Procedures (including critical care time)  Medications Ordered in ED Medications  amoxicillin-clavulanate (AUGMENTIN) 875-125 MG per tablet 1 tablet (1 tablet Oral Given 05/06/18 1430)     Initial Impression / Assessment and Plan / ED Course  I have reviewed the triage vital signs and the nursing notes.  Pertinent labs & imaging results that were available during my care of the patient were reviewed by me and considered in my medical decision making (see chart for details).     Pt with cat bite to her right hand and wrist, claw abrasions to leg, cat current with vaccines and in custody of animal control.  Pt utd with tetanus. Wounds cleaned using safe cleans.  Imaging obtained, no fb, no bony injury.  Augmentin, bid wound care, close f/u with strict return precautions and/or f/u with Dr Amedeo Plenty or recheck here for worsened sx. First dose of augmentin given  here.  Final Clinical Impressions(s) / ED Diagnoses   Final diagnoses:  Cat bite, initial encounter    ED Discharge Orders         Ordered    amoxicillin-clavulanate (AUGMENTIN) 875-125 MG tablet  Every 12 hours     05/06/18 1436           Evalee Jefferson, PA-C 05/06/18 1452    Milton Ferguson, MD 05/06/18 1525

## 2018-05-06 NOTE — ED Notes (Signed)
JI in to eval pt

## 2018-05-06 NOTE — ED Notes (Signed)
Pt impatient to leave as she has another appointment she is supposed to go to  PA made aware that pt is stating her need to leave

## 2018-05-06 NOTE — ED Notes (Signed)
To Rad 

## 2018-05-06 NOTE — Discharge Instructions (Addendum)
Take the entire course of the antibiotic prescribed, your next dose this evening.  Wash your wounds twice daily using soap and water and/or the spray you received here.  Get rechecked for any signs of infection as discussed.  Your xray is normal.

## 2018-05-06 NOTE — ED Triage Notes (Addendum)
Pt was scratched and bitten by a stray cat today on right hand and leg.  Animal control called. Bleeding controlled.

## 2018-05-06 NOTE — ED Notes (Signed)
Superficial scratches to R little finger, R forearm  R thigh and calf area  Wounds cleansed  Animal control has cat and it is current on Rabies

## 2018-05-07 NOTE — Progress Notes (Signed)
GURTHA, PICKER ([id number]) Visit Report for 04/28/2018 Arrival Information Details Patient Name: [name], [name]. Date of Service: 04/28/2018 8:00 AM Medical Record Number: [id number] Patient Account Number: [id number] Date of Birth/Sex: 12-Nov-1960 (57 y.o. F) Treating RN: Roger Shelter Primary Care Treyvonne Tata: [employer] Other Clinician: Referring Vieva Brummitt: [employer] Treating Elta Angell/Extender: Cathie Olden in Treatment: 6 Visit Information History Since Last Visit All ordered tests and consults were completed: No Patient Arrived: Ambulatory Added or deleted any medications: No Arrival Time: 08:15 Any new allergies or adverse reactions: No Accompanied By: self Had a fall or experienced change in No Transfer Assistance: None activities of daily living that may affect Patient Identification Verified: Yes risk of falls: Secondary Verification Process Completed: Yes Signs or symptoms of abuse/neglect since last visito No Hospitalized since last visit: No Implantable device outside of the clinic excluding No cellular tissue based products placed in the center since last visit: Pain Present Now: No Electronic Signature(s) Signed: 04/28/2018 4:42:32 PM By: Roger Shelter Entered By: Roger Shelter on 04/28/2018 08:16:29 [name] ([id number]) -------------------------------------------------------------------------------- Encounter Discharge Information Details Patient Name: [name]. Date of Service: 04/28/2018 8:00 AM Medical Record Number: [id number] Patient Account Number: [id number] Date of Birth/Sex: 1961/05/17 (57 y.o. F) Treating RN: Roger Shelter Primary Care Giovoni Bunch: [employer] Other Clinician: Referring Candies Palm: [employer] Treating Jonny Longino/Extender: Cathie Olden in Treatment: 6 Encounter Discharge Information Items Discharge Condition: Stable Ambulatory Status: Ambulatory Discharge Destination:  Home Transportation: Private Auto Schedule Follow-up Appointment: Yes Clinical Summary of Care: Electronic Signature(s) Signed: 04/28/2018 4:42:32 PM By: Roger Shelter Entered By: Roger Shelter on 04/28/2018 08:33:54 [name] ([id number]) -------------------------------------------------------------------------------- Lower Extremity Assessment Details Patient Name: [name]. Date of Service: 04/28/2018 8:00 AM Medical Record Number: [id number] Patient Account Number: [id number] Date of Birth/Sex: 1960-10-11 (57 y.o. F) Treating RN: Roger Shelter Primary Care Makayia Duplessis: [employer] Other Clinician: Referring Larence Thone: [employer] Treating Clementine Soulliere/Extender: Cathie Olden in Treatment: 6 Edema Assessment Assessed: [Left: No] [Right: No] Edema: [Left: N] [Right: o] Vascular Assessment Claudication: Claudication Assessment [Right:None] Pulses: Dorsalis Pedis Palpable: [Right:Yes] Posterior Tibial Extremity colors, hair growth, and conditions: Extremity Color: [Right:Normal] Temperature of Extremity: [Right:Warm] Capillary Refill: [Right:< 3 seconds] Toe Nail Assessment Left: Right: Thick: No Discolored: No Deformed: No Improper Length and Hygiene: No Electronic Signature(s) Signed: 04/28/2018 4:42:32 PM By: Roger Shelter Entered By: Roger Shelter on 04/28/2018 08:21:42 [name] ([id number]) -------------------------------------------------------------------------------- Multi Wound Chart Details Patient Name: Rachel Docker D. Date of Service: 04/28/2018 8:00 AM Medical Record Number: [id number] Patient Account Number: [id number] Date of Birth/Sex: 08/25/1961 (57 y.o. F) Treating RN: Roger Shelter Primary Care Mandela Bello: [employer] Other Clinician: Referring Genessa Beman: [employer] Treating Haeley Fordham/Extender: Cathie Olden in Treatment: 6 Vital Signs Height(in): 56 Pulse(bpm): 74 Weight(lbs):  223 Blood Pressure(mmHg): 135/68 Body Mass Index(BMI): 41 Temperature(F): 98.1 Respiratory Rate 18 (breaths/min): Photos: [1:No Photos] [N/A:N/A] Wound Location: [1:Right Lower Leg - Posterior] [N/A:N/A] Wounding Event: [1:Trauma] [N/A:N/A] Primary Etiology: [1:Skin Tear] [N/A:N/A] Date Acquired: [1:03/03/2018] [N/A:N/A] Weeks of Treatment: [1:6] [N/A:N/A] Wound Status: [1:Open] [N/A:N/A] Measurements L x W x D [1:3.8x4.7x0.1] [N/A:N/A] (cm) Area (cm) : [1:14.027] [N/A:N/A] Volume (cm) : [1:1.403] [N/A:N/A] % Reduction in Area: [1:37.40%] [N/A:N/A] % Reduction in Volume: [1:37.40%] [N/A:N/A] Classification: [1:Full Thickness With Exposed Support Structures] [N/A:N/A] Exudate Amount: [1:Large] [N/A:N/A] Exudate Type: [1:Serous] [N/A:N/A] Exudate Color: [1:amber] [N/A:N/A] Wound Margin: [1:Flat and Intact] [N/A:N/A] Granulation Amount: [1:Medium (34-66%)] [N/A:N/A] Granulation Quality: [1:Red, Friable] [N/A:N/A] Necrotic Amount: [1:Medium (34-66%)] [N/A:N/A] Exposed Structures: [1:Fat  Layer (Subcutaneous Tissue) Exposed: Yes Fascia: No Tendon: No Muscle: No Joint: No Bone: No] [N/A:N/A] Epithelialization: [1:Small (1-33%)] [N/A:N/A] Periwound Skin Texture: [1:Excoriation: No Induration: No Callus: No Crepitus: No Rash: No Scarring: No] [N/A:N/A] Periwound Skin Moisture: [N/A:N/A] Maceration: Yes Dry/Scaly: No Periwound Skin Color: Erythema: Yes N/A N/A Atrophie Blanche: No Cyanosis: No Ecchymosis: No Hemosiderin Staining: No Mottled: No Pallor: No Rubor: No Erythema Location: Circumferential N/A N/A Temperature: No Abnormality N/A N/A Tenderness on Palpation: Yes N/A N/A Wound Preparation: Ulcer Cleansing: N/A N/A Rinsed/Irrigated with Saline Topical Anesthetic Applied: Other: lidocaine 4% Treatment Notes Electronic Signature(s) Signed: 04/28/2018 4:42:32 PM By: Roger Shelter Entered By: Roger Shelter on 04/28/2018 08:23:07 [name]  ([id number]) -------------------------------------------------------------------------------- [place] Details Patient Name: [name]. Date of Service: 04/28/2018 8:00 AM Medical Record Number: [id number] Patient Account Number: [id number] Date of Birth/Sex: [birth year] (57 y.o. F) Treating RN: Roger Shelter Primary Care Opal Mckellips: [employer] Other Clinician: Referring Querida Beretta: [employer] Treating Dhruvan Gullion/Extender: Cathie Olden in Treatment: 6 Active Inactive ` Abuse / Safety / Falls / Self Care Management Nursing Diagnoses: Potential for falls Goals: Patient will not experience any injury related to falls Date Initiated: 03/17/2018 Target Resolution Date: 07/16/2018 Goal Status: Active Interventions: Assess Activities of Daily Living upon admission and as needed Assess fall risk on admission and as needed Assess: immobility, friction, shearing, incontinence upon admission and as needed Notes: ` Nutrition Nursing Diagnoses: Imbalanced nutrition Potential for alteratiion in Nutrition/Potential for imbalanced nutrition Goals: Patient/caregiver agrees to and verbalizes understanding of need to use nutritional supplements and/or vitamins as prescribed Date Initiated: 03/17/2018 Target Resolution Date: 06/11/2018 Goal Status: Active Interventions: Assess patient nutrition upon admission and as needed per policy Notes: ` Orientation to the Wound Care Program Nursing Diagnoses: Knowledge deficit related to the wound healing center program Goals: Patient/caregiver will verbalize understanding of the [facility] Date Initiated: 03/17/2018 Target Resolution Date: 04/16/2018 FRYDA, MOLENDA ([id number]) Goal Status: Active Interventions: Provide education on orientation to the wound center Notes: ` Pain, Acute or Chronic Nursing Diagnoses: Pain, acute or chronic: actual or potential Potential alteration in  comfort, pain Goals: Patient/caregiver will verbalize adequate pain control between visits Date Initiated: 03/17/2018 Target Resolution Date: 06/11/2018 Goal Status: Active Interventions: Complete pain assessment as per visit requirements Notes: ` Wound/Skin Impairment Nursing Diagnoses: Impaired tissue integrity Knowledge deficit related to ulceration/compromised skin integrity Goals: Ulcer/skin breakdown will have a volume reduction of 80% by week 12 Date Initiated: 03/17/2018 Target Resolution Date: 07/09/2018 Goal Status: Active Interventions: Assess patient/caregiver ability to perform ulcer/skin care regimen upon admission and as needed Assess ulceration(s) every visit Notes: Electronic Signature(s) Signed: 04/28/2018 4:42:32 PM By: Roger Shelter Entered By: Roger Shelter on 04/28/2018 08:22:59 [name] ([id number]) -------------------------------------------------------------------------------- Pain Assessment Details Patient Name: Rachel Docker D. Date of Service: 04/28/2018 8:00 AM Medical Record Number: [id number] Patient Account Number: [id number] Date of Birth/Sex: 30-Jun-1961 (57 y.o. F) Treating RN: Roger Shelter Primary Care Laree Garron: [employer] Other Clinician: Referring Jakarius Flamenco: [employer] Treating Tanga Gloor/Extender: Cathie Olden in Treatment: 6 Active Problems Location of Pain Severity and Description of Pain Patient Has Paino No Site Locations Pain Management and Medication Current Pain Management: Electronic Signature(s) Signed: 04/28/2018 4:42:32 PM By: Roger Shelter Entered By: Roger Shelter on 04/28/2018 08:16:34 [name] ([id number]) -------------------------------------------------------------------------------- Patient/Caregiver Education Details Patient Name: [name]. Date of Service: 04/28/2018 8:00 AM Medical Record Number: [id number] Patient Account Number: [id number] Date of  Birth/Gender: [birth year] (57 y.o. F)  Treating RN: Roger Shelter Primary Care Physician: [employer] Other Clinician: Referring Physician: [employer] Treating Physician/Extender: Cathie Olden in Treatment: 6 Education Assessment Education Provided To: Patient Education Topics Provided Wound Debridement: Handouts: Wound Debridement Methods: Explain/Verbal Responses: State content correctly Wound/Skin Impairment: Handouts: Caring for Your Ulcer Methods: Explain/Verbal Responses: State content correctly Electronic Signature(s) Signed: 04/28/2018 4:42:32 PM By: Roger Shelter Entered By: Roger Shelter on 04/28/2018 08:34:09 [name] ([id number]) -------------------------------------------------------------------------------- Wound Assessment Details Patient Name: Rachel Docker D. Date of Service: 04/28/2018 8:00 AM Medical Record Number: [id number] Patient Account Number: [id number] Date of Birth/Sex: 1961-07-04 (57 y.o. F) Treating RN: Roger Shelter Primary Care Boubacar Lerette: [employer] Other Clinician: Referring Jyra Lagares: [employer] Treating Charis Juliana/Extender: Lawanda Cousins Weeks in Treatment: 6 Wound Status Wound Number: 1 Primary Etiology: Skin Tear Wound Location: Right Lower Leg - Posterior Wound Status: Open Wounding Event: Trauma Date Acquired: 03/03/2018 Weeks Of Treatment: 6 Clustered Wound: No Photos Photo Uploaded By: Roger Shelter on 04/28/2018 11:42:53 Wound Measurements Length: (cm) 3.8 Width: (cm) 4.7 Depth: (cm) 0.1 Area: (cm) 14.027 Volume: (cm) 1.403 % Reduction in Area: 37.4% % Reduction in Volume: 37.4% Epithelialization: Small (1-33%) Tunneling: No Undermining: No Wound Description Full Thickness With Exposed Support Classification: Structures Wound Margin: Flat and Intact Exudate Large Amount: Exudate Type: Serous Exudate Color: amber Foul Odor After Cleansing: No Slough/Fibrino Yes Wound  Bed Granulation Amount: Medium (34-66%) Exposed Structure Granulation Quality: Red, Friable Fascia Exposed: No Necrotic Amount: Medium (34-66%) Fat Layer (Subcutaneous Tissue) Exposed: Yes Necrotic Quality: Adherent Slough Tendon Exposed: No Muscle Exposed: No Joint Exposed: No Bone Exposed: No JAIMARIE, RAPOZO D. ([id number]) Periwound Skin Texture Texture Color No Abnormalities Noted: No No Abnormalities Noted: No Callus: No Atrophie Blanche: No Crepitus: No Cyanosis: No Excoriation: No Ecchymosis: No Induration: No Erythema: Yes Rash: No Erythema Location: Circumferential Scarring: No Hemosiderin Staining: No Mottled: No Moisture Pallor: No No Abnormalities Noted: No Rubor: No Dry / Scaly: No Maceration: Yes Temperature / Pain Temperature: No Abnormality Tenderness on Palpation: Yes Wound Preparation Ulcer Cleansing: Rinsed/Irrigated with Saline Topical Anesthetic Applied: Other: lidocaine 4%, Treatment Notes Wound #1 (Right, Posterior Lower Leg) 1. Cleansed with: Clean wound with Normal Saline 2. Anesthetic Topical Lidocaine 4% cream to wound bed prior to debridement 4. Dressing Applied: Medihoney Gel 5. Secondary Dressing Applied Bordered Foam Dressing Electronic Signature(s) Signed: 04/28/2018 4:42:32 PM By: Roger Shelter Entered By: Roger Shelter on 04/28/2018 08:21:15 [name] ([id number]) -------------------------------------------------------------------------------- Vitals Details Patient Name: Rachel Docker D. Date of Service: 04/28/2018 8:00 AM Medical Record Number: [id number] Patient Account Number: [id number] Date of Birth/Sex: 1960-11-04 (57 y.o. F) Treating RN: Roger Shelter Primary Care Marv Alfrey: [employer] Other Clinician: Referring Inette Doubrava: [employer] Treating Siniya Lichty/Extender: Cathie Olden in Treatment: 6 Vital Signs Time Taken: 08:15 Temperature (F): 98.1 Height (in): 62 Pulse (bpm):  74 Weight (lbs): 223 Respiratory Rate (breaths/min): 18 Body Mass Index (BMI): 40.8 Blood Pressure (mmHg): 135/68 Reference Range: 80 - 120 mg / dl Electronic Signature(s) Signed: 04/28/2018 4:42:32 PM By: Roger Shelter Entered By: Roger Shelter on 04/28/2018 09:32:35

## 2018-05-07 NOTE — Progress Notes (Addendum)
KRYSLYN, HELBIG ([id number]) Visit Report for 04/28/2018 Chief Complaint Document Details Patient Name: [name], [name]. Date of Service: 04/28/2018 8:00 AM Medical Record Number: [id number] Patient Account Number: [id number] Date of Birth/Sex: [birth year] (57 y.o. F) Treating RN: Ahmed Prima Primary Care Provider: [employer] Other Clinician: Referring Provider: [employer] Treating Provider/Extender: Cathie Olden in Treatment: 6 Information Obtained from: Patient Chief Complaint right posterior calf ulcer Electronic Signature(s) Signed: 04/28/2018 8:22:12 AM By: Lawanda Cousins Entered By: Lawanda Cousins on 04/28/2018 08:22:12 [name] ([id number]) -------------------------------------------------------------------------------- Debridement Details Patient Name: [name]. Date of Service: 04/28/2018 8:00 AM Medical Record Number: [id number] Patient Account Number: [id number] Date of Birth/Sex: [birth year] (57 y.o. F) Treating RN: Roger Shelter Primary Care Provider: [employer] Other Clinician: Referring Provider: [employer] Treating Provider/Extender: Cathie Olden in Treatment: 6 Debridement Performed for Wound #1 Right,Posterior Lower Leg Assessment: Performed By: Physician Lawanda Cousins, NP Debridement Type: Debridement Pre-procedure Verification/Time Yes - 08:24 Out Taken: Start Time: 08:24 Pain Control: Other : lidocaine 4% Total Area Debrided (L x W): 3.8 (cm) x 4.7 (cm) = 17.86 (cm) Tissue and other material Viable, Non-Viable, Slough, Subcutaneous, Biofilm, Slough debrided: Level: Skin/Subcutaneous Tissue Debridement Description: Excisional Instrument: Curette Bleeding: Minimum Hemostasis Achieved: Pressure End Time: 08:26 Procedural Pain: 0 Post Procedural Pain: 0 Response to Treatment: Procedure was tolerated well Level of Consciousness: Awake and Alert Post Debridement Measurements of Total  Wound Length: (cm) 3.8 Width: (cm) 4.7 Depth: (cm) 0.1 Volume: (cm) 1.403 Character of Wound/Ulcer Post Debridement: Stable Post Procedure Diagnosis Same as Pre-procedure Electronic Signature(s) Signed: 04/28/2018 4:42:32 PM By: Roger Shelter Signed: 05/10/2018 5:47:54 PM By: Lawanda Cousins Entered By: Roger Shelter on 04/28/2018 08:25:11 [name] ([id number]) -------------------------------------------------------------------------------- HPI Details Patient Name: Rachel Docker D. Date of Service: 04/28/2018 8:00 AM Medical Record Number: [id number] Patient Account Number: [id number] Date of Birth/Sex: 01/05/1961 (57 y.o. F) Treating RN: Ahmed Prima Primary Care Provider: [employer] Other Clinician: Referring Provider: [employer] Treating Provider/Extender: Cathie Olden in Treatment: 6 History of Present Illness HPI Description: 03/17/18-She is here for initial evaluation of the right posterior calf ulcer. She states that roughly 3 weeks ago, she [place] the beach, and abraded her right calf on a bicycle tire, developing a non-blistered hematoma. She states presently 3 days after that she lacerated the hematoma on a table. After returning home she went to urgent care, had Steri-Strips placed and was prescribed Cefdnir and topical Polysporin. She states the pain is improved. She denies wearing any compression therapy. she is nondiabetic and does not smoke 03/24/18-She is here for evaluation for a right posterior calf ulcer. She did not receive supplies as ordered last week secondary to insurance coverage. She has been applying medical honey and Ace wrap compression, although she admits that she is unable to apply Ace wrap at same compression as placed here in the clinic. She tolerated debridement today we will continue with same treatment plan she'll follow-up next week. She has been encouraged to contact her insurance company  regarding supply company coverage. 03/31/18-She is here for evaluation of her right posterior calf ulcer. She continues to make improvement with granulation tissue throughout, minimal amount of nonviable tissue. We will continue the same treatment plan and she will follow-up next week 04/07/18-She is here in follow-up evaluation for right posterior calf ulcer. She continues to make improvement. We'll continue with the same treatment plan and she'll follow-up in 2 weeks. 04/28/18-She is seen in follow  up evaluation of her right posterior calf ulcer. There continues to be improvement. She has irregular epithelial islands throughout the wound margin. There is periwound erythema, secondary to irritation from product (latex cause), she states erythema has been present for "weeks" and is unchanged. She will continue with medical hernia follow-up in 2 weeks Electronic Signature(s) Signed: 04/28/2018 8:33:41 AM By: Lawanda Cousins Previous Signature: 04/28/2018 8:32:13 AM Version By: Lawanda Cousins Entered By: Lawanda Cousins on 04/28/2018 08:33:40 [name] ([id number]) -------------------------------------------------------------------------------- Physician Orders Details Patient Name: [name]. Date of Service: 04/28/2018 8:00 AM Medical Record Number: [id number] Patient Account Number: [id number] Date of Birth/Sex: 1961/08/30 (57 y.o. F) Treating RN: Roger Shelter Primary Care Provider: [employer] Other Clinician: Referring Provider: [employer] Treating Provider/Extender: Cathie Olden in Treatment: 6 Verbal / Phone Orders: No Diagnosis Coding ICD-10 Coding Code Description [phone] Non-pressure chronic ulcer of right calf with fat layer exposed Wound Cleansing Wound #1 Right,Posterior Lower Leg o Clean wound with Normal Saline. o May shower with protection. Anesthetic (add to Medication List) Wound #1 Right,Posterior Lower Leg o Topical Lidocaine 4% cream  applied to wound bed prior to debridement (In Clinic Only). Primary Wound Dressing Wound #1 Right,Posterior Lower Leg o Medihoney gel Secondary Dressing Wound #1 Right,Posterior Lower Leg o Boardered Foam Dressing Dressing Change Frequency Wound #1 Right,Posterior Lower Leg o Change dressing every day. Follow-up Appointments Wound #1 Right,Posterior Lower Leg o Return Appointment in 2 weeks. Edema Control Wound #1 Right,Posterior Lower Leg o Elevate legs to the level of the heart and pump ankles as often as possible Additional Orders / Instructions Wound #1 Right,Posterior Lower Leg o Increase protein intake. Electronic Signature(s) Signed: 04/28/2018 4:42:32 PM By: Roger Shelter Signed: 05/10/2018 5:47:54 PM By: Rachel Si D. ([id number]) Entered By: Roger Shelter on 04/28/2018 08:27:40 [name] ([id number]) -------------------------------------------------------------------------------- Problem List Details Patient Name: [name], [name]. Date of Service: 04/28/2018 8:00 AM Medical Record Number: [id number] Patient Account Number: [id number] Date of Birth/Sex: May 04, 1961 (57 y.o. F) Treating RN: Ahmed Prima Primary Care Provider: [employer] Other Clinician: Referring Provider: [employer] Treating Provider/Extender: Cathie Olden in Treatment: 6 Active Problems ICD-10 Evaluated Encounter Code Description Active Date Today Diagnosis L97.212 Non-pressure chronic ulcer of right calf with fat layer exposed 03/17/2018 No Yes Inactive Problems Resolved Problems Electronic Signature(s) Signed: 04/28/2018 8:20:41 AM By: Lawanda Cousins Entered By: Lawanda Cousins on 04/28/2018 08:20:41 [name] ([id number]) -------------------------------------------------------------------------------- Progress Note Details Patient Name: Rachel Docker D. Date of Service: 04/28/2018 8:00 AM Medical Record Number:  [id number] Patient Account Number: [id number] Date of Birth/Sex: [birth year] (57 y.o. F) Treating RN: Ahmed Prima Primary Care Provider: [employer] Other Clinician: Referring Provider: [employer] Treating Provider/Extender: Cathie Olden in Treatment: 6 Subjective Chief Complaint Information obtained from Patient right posterior calf ulcer History of Present Illness (HPI) 03/17/18-She is here for initial evaluation of the right posterior calf ulcer. She states that roughly 3 weeks ago, she was at a time at the beach, and abraded her right calf on a bicycle tire, developing a non-blistered hematoma. She states presently 3 days after that she lacerated the hematoma on a table. After returning home she went to urgent care, had Steri-Strips placed and was prescribed Cefdnir and topical Polysporin. She states the pain is improved. She denies wearing any compression therapy. she is nondiabetic and does not smoke 03/24/18-She is here for evaluation for a right posterior calf ulcer. She did not receive supplies as ordered last week  secondary to insurance coverage. She has been applying medical honey and Ace wrap compression, although she admits that she is unable to apply Ace wrap at same compression as placed here in the clinic. She tolerated debridement today we will continue with same treatment plan she'll follow-up next week. She has been encouraged to contact her insurance company regarding supply company coverage. 03/31/18-She is here for evaluation of her right posterior calf ulcer. She continues to make improvement with granulation tissue throughout, minimal amount of nonviable tissue. We will continue the same treatment plan and she will follow-up next week 04/07/18-She is here in follow-up evaluation for right posterior calf ulcer. She continues to make improvement. We'll continue with the same treatment plan and she'll follow-up in 2 weeks. 04/28/18-She is seen in follow up  evaluation of her right posterior calf ulcer. There continues to be improvement. She has irregular epithelial islands throughout the wound margin. There is periwound erythema, secondary to irritation from product (latex cause), she states erythema has been present for "weeks" and is unchanged. She will continue with medical hernia follow-up in 2 weeks Patient History Information obtained from Patient. Social History Former smoker - 15 years, Marital Status - Married, Alcohol Use - Rarely, Drug Use - No History, Caffeine Use - Daily. Medical And Surgical History Notes Cardiovascular MVP Oncologic skin cancers Review of Systems (ROS) Constitutional Symptoms (General Health) Denies complaints or symptoms of Fatigue, Fever, Chills. Respiratory Denies complaints or symptoms of Chronic or frequent coughs, Shortness of Breath. Cardiovascular Denies complaints or symptoms of Chest pain, LE edema. Gastrointestinal [name], [name] ([id number]) Denies complaints or symptoms of Frequent diarrhea, Nausea, Vomiting. Objective Constitutional Vitals Time Taken: 8:15 AM, Height: 62 in, Weight: 223 lbs, BMI: 40.8, Temperature: 98.1 F, Pulse: 74 bpm, Respiratory Rate: 18 breaths/min, Blood Pressure: 135/68 mmHg. Integumentary (Hair, Skin) Wound #1 status is Open. Original cause of wound was Trauma. The wound is located on the Right,Posterior Lower Leg. The wound measures 3.8cm length x 4.7cm width x 0.1cm depth; 14.027cm^2 area and 1.403cm^3 volume. There is Fat Layer (Subcutaneous Tissue) Exposed exposed. There is no tunneling or undermining noted. There is a large amount of serous drainage noted. The wound margin is flat and intact. There is medium (34-66%) red, friable granulation within the wound bed. There is a medium (34-66%) amount of necrotic tissue within the wound bed including Adherent Slough. The periwound skin appearance exhibited: Maceration, Erythema. The periwound skin appearance  did not exhibit: Callus, Crepitus, Excoriation, Induration, Rash, Scarring, Dry/Scaly, Atrophie Blanche, Cyanosis, Ecchymosis, Hemosiderin Staining, Mottled, Pallor, Rubor. The surrounding wound skin color is noted with erythema which is circumferential. Periwound temperature was noted as No Abnormality. The periwound has tenderness on palpation. Assessment Active Problems ICD-10 Non-pressure chronic ulcer of right calf with fat layer exposed Procedures Wound #1 Pre-procedure diagnosis of Wound #1 is a Skin Tear located on the Right,Posterior Lower Leg . There was a Excisional Skin/Subcutaneous Tissue Debridement with a total area of 17.86 sq cm performed by Lawanda Cousins, NP. With the following instrument(s): Curette to remove Viable and Non-Viable tissue/material. Material removed includes Subcutaneous Tissue, Slough, and Biofilm after achieving pain control using Other (lidocaine 4%). No specimens were taken. A time out was conducted at 08:24, prior to the start of the procedure. A Minimum amount of bleeding was controlled with Pressure. The procedure was tolerated well with a pain level of 0 throughout and a pain level of 0 following the procedure. Patient s Level of Consciousness post procedure was  recorded as Awake and Alert. Post Debridement Measurements: 3.8cm length x 4.7cm width x 0.1cm depth; 1.403cm^3 volume. Character of Wound/Ulcer Post Debridement is stable. Post procedure Diagnosis Wound #1: Same as Pre-Procedure [name], [name] ([id number]) Plan Wound Cleansing: Wound #1 Right,Posterior Lower Leg: Clean wound with Normal Saline. May shower with protection. Anesthetic (add to Medication List): Wound #1 Right,Posterior Lower Leg: Topical Lidocaine 4% cream applied to wound bed prior to debridement (In Clinic Only). Primary Wound Dressing: Wound #1 Right,Posterior Lower Leg: Medihoney gel Secondary Dressing: Wound #1 Right,Posterior Lower Leg: Boardered Foam  Dressing Dressing Change Frequency: Wound #1 Right,Posterior Lower Leg: Change dressing every day. Follow-up Appointments: Wound #1 Right,Posterior Lower Leg: Return Appointment in 2 weeks. Edema Control: Wound #1 Right,Posterior Lower Leg: Elevate legs to the level of the heart and pump ankles as often as possible Additional Orders / Instructions: Wound #1 Right,Posterior Lower Leg: Increase protein intake. Electronic Signature(s) Signed: 04/28/2018 8:33:53 AM By: Lawanda Cousins Entered By: Lawanda Cousins on 04/28/2018 08:33:53 [name] ([id number]) -------------------------------------------------------------------------------- ROS/PFSH Details Patient Name: Rachel Docker D. Date of Service: 04/28/2018 8:00 AM Medical Record Number: [id number] Patient Account Number: [id number] Date of Birth/Sex: 1960/09/12 (57 y.o. F) Treating RN: Ahmed Prima Primary Care Provider: [employer] Other Clinician: Referring Provider: [employer] Treating Provider/Extender: Cathie Olden in Treatment: 6 Information Obtained From Patient Wound History Do you currently have one or more open woundso Yes How many open wounds do you currently haveo 1 Approximately how long have you had your woundso 3 weeks How have you been treating your wound(s) until nowo mupirocin Has your wound(s) ever healed and then re-openedo No Have you had any lab work done in the past montho No Have you tested positive for an antibiotic resistant organism (MRSA, VRE)o No Have you tested positive for osteomyelitis (bone infection)o No Have you had any tests for circulation on your legso Yes Who ordered the testo PCP Where was the test doneo [facility] Constitutional Symptoms (General Health) Complaints and Symptoms: Negative for: Fatigue; Fever; Chills Respiratory Complaints and Symptoms: Negative for: Chronic or frequent coughs; Shortness of Breath Medical History: Negative for: Aspiration; Asthma;  Chronic Obstructive Pulmonary Disease (COPD); Pneumothorax; Sleep Apnea; Tuberculosis Cardiovascular Complaints and Symptoms: Negative for: Chest pain; LE edema Medical History: Negative for: Angina; Arrhythmia; Congestive Heart Failure; Coronary Artery Disease; Deep Vein Thrombosis; Hypertension; Hypotension; Myocardial Infarction; Peripheral Arterial Disease; Peripheral Venous Disease; Phlebitis; Vasculitis Past Medical History Notes: MVP Gastrointestinal Complaints and Symptoms: Negative for: Frequent diarrhea; Nausea; Vomiting Medical History: Negative for: Cirrhosis ; Colitis; Crohnos; Hepatitis A; Hepatitis B; Hepatitis C [name], FLEMINGS. ([id number]) Eyes Medical History: Negative for: Cataracts; Glaucoma; Optic Neuritis Ear/Nose/Mouth/Throat Medical History: Negative for: Chronic sinus problems/congestion; Middle ear problems Hematologic/Lymphatic Medical History: Negative for: Anemia; Hemophilia; Human Immunodeficiency Virus; Lymphedema; Sickle Cell Disease Endocrine Medical History: Negative for: Type I Diabetes; Type II Diabetes Genitourinary Medical History: Negative for: End Stage Renal Disease Immunological Medical History: Negative for: Lupus Erythematosus; Raynaudos; Scleroderma Integumentary (Skin) Medical History: Negative for: History of Burn; History of pressure wounds Musculoskeletal Medical History: Negative for: Gout; Rheumatoid Arthritis; Osteoarthritis; Osteomyelitis Neurologic Medical History: Negative for: Dementia; Neuropathy; Quadriplegia; Paraplegia; Seizure Disorder Oncologic Medical History: Negative for: Received Chemotherapy; Received Radiation Past Medical History Notes: skin cancers Psychiatric Medical History: Negative for: Anorexia/bulimia; Confinement Anxiety Immunizations Pneumococcal Vaccine: [name], [name] ([id number]) Received Pneumococcal Vaccination: Yes Tetanus Vaccine: Last tetanus shot: 02/28/2018 Implantable  Devices Family and Social History Former smoker - 66 years; Marital Status -  Married; Alcohol Use: Rarely; Drug Use: No History; Caffeine Use: Daily; Financial Concerns: No; Food, Clothing or Shelter Needs: No; Support System Lacking: No; Transportation Concerns: No; Advanced Directives: No; Patient does not want information on Advanced Directives Physician Affirmation I have reviewed and agree with the above information. Electronic Signature(s) Signed: 04/29/2018 3:54:45 PM By: Alric Quan Signed: 05/10/2018 5:47:54 PM By: Lawanda Cousins Entered By: Lawanda Cousins on 04/28/2018 08:32:41 [name] ([id number]) -------------------------------------------------------------------------------- SuperBill Details Patient Name: Rachel Docker D. Date of Service: 04/28/2018 Medical Record Number: [id number] Patient Account Number: [id number] Date of Birth/Sex: June 22, 1961 (57 y.o. F) Treating RN: Ahmed Prima Primary Care Provider: [employer] Other Clinician: Referring Provider: [employer] Treating Provider/Extender: Cathie Olden in Treatment: 6 Diagnosis Coding ICD-10 Codes Code Description [phone] Non-pressure chronic ulcer of right calf with fat layer exposed Facility Procedures CPT4 Code: [id number] Description: [place] TISSUE 20 SQ CM/< ICD-10 Diagnosis Description L97.212 Non-pressure chronic ulcer of right calf with fat layer exp Modifier: osed Quantity: 1 Physician Procedures CPT4 Code: [id number] Description: 58832 - WC PHYS SUBQ TISS 20 SQ CM ICD-10 Diagnosis Description L97.212 Non-pressure chronic ulcer of right calf with fat layer exp Modifier: osed Quantity: 1 Electronic Signature(s) Signed: 04/28/2018 8:34:01 AM By: Lawanda Cousins Entered By: Lawanda Cousins on 04/28/2018 08:34:01

## 2018-05-12 ENCOUNTER — Encounter: Payer: PRIVATE HEALTH INSURANCE | Attending: Nurse Practitioner | Admitting: Nurse Practitioner

## 2018-05-12 DIAGNOSIS — L97212 Non-pressure chronic ulcer of right calf with fat layer exposed: Secondary | ICD-10-CM | POA: Diagnosis not present

## 2018-05-15 NOTE — Progress Notes (Signed)
RUMAISA, SCHNETZER ([id number]) Visit Report for 05/12/2018 Allergy List Details Patient Name: [name], [name]. Date of Service: 05/12/2018 8:30 AM Medical Record Number: [id number] Patient Account Number: [id number] Date of Birth/Sex: 1961/08/15 (57 y.o. F) Treating RN: Montey Hora Primary Care Carmine Carrozza: [employer] Other Clinician: Referring Chelsa Stout: [employer] Treating Brodee Mauritz/Extender: Lawanda Cousins Weeks in Treatment: 8 Allergies Active Allergies Augmentin Reaction: facial swelling Allergy Notes Electronic Signature(s) Signed: 05/12/2018 5:08:53 PM By: Montey Hora Entered By: Montey Hora on 05/12/2018 08:37:07 [name] ([id number]) -------------------------------------------------------------------------------- [place] Information Details Patient Name: [name]. Date of Service: 05/12/2018 8:30 AM Medical Record Number: [id number] Patient Account Number: [id number] Date of Birth/Sex: June 22, 1961 (57 y.o. F) Treating RN: Roger Shelter Primary Care Nathaniel Wakeley: [employer] Other Clinician: Referring Makiyla Linch: [employer] Treating Althea Backs/Extender: Cathie Olden in Treatment: 8 Visit Information Patient Arrived: Ambulatory Arrival Time: 08:30 Accompanied By: self Transfer Assistance: None Patient Identification Verified: Yes Secondary Verification Process Completed: Yes History Since Last Visit Added or deleted any medications: Yes Any new allergies or adverse reactions: No Had a fall or experienced change in activities of daily living that may affect risk of falls: No Signs or symptoms of abuse/neglect since last visito No Hospitalized since last visit: No Implantable device outside of the clinic excluding cellular tissue based products placed in the center since last visit: No Has Dressing in Place as Prescribed: Yes Electronic Signature(s) Signed: 05/12/2018 4:37:30 PM By: Lorine Bears RCP, RRT, CHT Entered  By: Lorine Bears on 05/12/2018 08:31:44 [name] ([id number]) -------------------------------------------------------------------------------- Clinic Level of Care Assessment Details Patient Name: [name]. Date of Service: 05/12/2018 8:30 AM Medical Record Number: [id number] Patient Account Number: [id number] Date of Birth/Sex: [birth year] (57 y.o. F) Treating RN: Roger Shelter Primary Care Arcadia Gorgas: [employer] Other Clinician: Referring Hemi Chacko: [employer] Treating Menna Abeln/Extender: Cathie Olden in Treatment: 8 Clinic Level of Care Assessment Items TOOL 4 Quantity Score X - Use when only an EandM is performed on FOLLOW-UP visit 1 0 ASSESSMENTS - Nursing Assessment / Reassessment X - Reassessment of Co-morbidities (includes updates in patient status) 1 10 X- 1 5 Reassessment of Adherence to Treatment Plan ASSESSMENTS - Wound and Skin Assessment / Reassessment X - Simple Wound Assessment / Reassessment - one wound 1 5 []  - 0 Complex Wound Assessment / Reassessment - multiple wounds []  - 0 Dermatologic / Skin Assessment (not related to wound area) ASSESSMENTS - Focused Assessment []  - Circumferential Edema Measurements - multi extremities 0 []  - 0 Nutritional Assessment / Counseling / Intervention []  - 0 Lower Extremity Assessment (monofilament, tuning fork, pulses) []  - 0 Peripheral Arterial Disease Assessment (using hand held doppler) ASSESSMENTS - Ostomy and/or Continence Assessment and Care []  - Incontinence Assessment and Management 0 []  - 0 Ostomy Care Assessment and Management (repouching, etc.) PROCESS - Coordination of Care X - Simple Patient / Family Education for ongoing care 1 15 []  - 0 Complex (extensive) Patient / Family Education for ongoing care []  - 0 Staff obtains Programmer, systems, Records, Test Results / Process Orders []  - 0 Staff telephones HHA, Nursing Homes / Clarify orders / etc []  - 0 Routine Transfer to  another Facility (non-emergent condition) []  - 0 Routine Hospital Admission (non-emergent condition) []  - 0 New Admissions / Biomedical engineer / Ordering NPWT, Apligraf, etc. []  - 0 Emergency Hospital Admission (emergent condition) X- 1 10 Simple Discharge Coordination JAHARI, WIGINTON ([id number]) []  - 0 Complex (extensive) Discharge Coordination PROCESS - Special Needs []  -  Pediatric / Minor Patient Management 0 []  - 0 Isolation Patient Management []  - 0 Hearing / Language / Visual special needs []  - 0 Assessment of Community assistance (transportation, D/C planning, etc.) []  - 0 Additional assistance / Altered mentation []  - 0 Support Surface(s) Assessment (bed, cushion, seat, etc.) INTERVENTIONS - Wound Cleansing / Measurement X - Simple Wound Cleansing - one wound 1 5 []  - 0 Complex Wound Cleansing - multiple wounds X- 1 5 Wound Imaging (photographs - any number of wounds) []  - 0 Wound Tracing (instead of photographs) X- 1 5 Simple Wound Measurement - one wound []  - 0 Complex Wound Measurement - multiple wounds INTERVENTIONS - Wound Dressings X - Small Wound Dressing one or multiple wounds 1 10 []  - 0 Medium Wound Dressing one or multiple wounds []  - 0 Large Wound Dressing one or multiple wounds []  - 0 Application of Medications - topical []  - 0 Application of Medications - injection INTERVENTIONS - Miscellaneous []  - External ear exam 0 []  - 0 Specimen Collection (cultures, biopsies, blood, body fluids, etc.) []  - 0 Specimen(s) / Culture(s) sent or taken to Lab for analysis []  - 0 Patient Transfer (multiple staff / Civil Service fast streamer / Similar devices) []  - 0 Simple Staple / Suture removal (25 or less) []  - 0 Complex Staple / Suture removal (26 or more) []  - 0 Hypo / Hyperglycemic Management (close monitor of Blood Glucose) []  - 0 Ankle / Brachial Index (ABI) - do not check if billed separately X- 1 5 Vital Signs TELETHA, PETREA.  ([id number]) Has the patient been seen at the hospital within the last three years: Yes Total Score: 75 Level Of Care: New/Established - Level 2 Electronic Signature(s) Signed: 05/12/2018 2:41:32 PM By: Roger Shelter Entered By: Roger Shelter on 05/12/2018 08:55:47 [name] ([id number]) -------------------------------------------------------------------------------- Encounter Discharge Information Details Patient Name: [name]. Date of Service: 05/12/2018 8:30 AM Medical Record Number: [id number] Patient Account Number: [id number] Date of Birth/Sex: [birth year] (57 y.o. F) Treating RN: Cornell Barman Primary Care Shelda Truby: [employer] Other Clinician: Referring Marranda Arakelian: [employer] Treating Laurissa Cowper/Extender: Cathie Olden in Treatment: 8 Encounter Discharge Information Items Discharge Condition: Stable Ambulatory Status: Ambulatory Discharge Destination: Home Transportation: Private Auto Accompanied By: self Schedule Follow-up Appointment: Yes Clinical Summary of Care: Electronic Signature(s) Signed: 05/12/2018 5:22:10 PM By: Gretta Cool, BSN, RN, CWS, Kim RN, BSN Entered By: Gretta Cool, BSN, RN, CWS, Kim on 05/12/2018 09:04:01 [name] ([id number]) -------------------------------------------------------------------------------- Lower Extremity Assessment Details Patient Name: TRAMEKA, DOROUGH. Date of Service: 05/12/2018 8:30 AM Medical Record Number: [id number] Patient Account Number: [id number] Date of Birth/Sex: 1961-08-13 (57 y.o. F) Treating RN: Montey Hora Primary Care Tyquasia Pant: [employer] Other Clinician: Referring Areana Kosanke: [employer] Treating Veda Arrellano/Extender: Cathie Olden in Treatment: 8 Vascular Assessment Pulses: Dorsalis Pedis Palpable: [Right:Yes] Posterior Tibial Extremity colors, hair growth, and conditions: Extremity Color: [Right:Normal] Hair Growth on Extremity: [Right:No] Temperature of Extremity:  [Right:Warm] Capillary Refill: [Right:< 3 seconds] Toe Nail Assessment Left: Right: Thick: No Discolored: No Deformed: No Improper Length and Hygiene: No Electronic Signature(s) Signed: 05/12/2018 5:08:53 PM By: Montey Hora Entered By: Montey Hora on 05/12/2018 08:42:44 [name] ([id number]) -------------------------------------------------------------------------------- Multi Wound Chart Details Patient Name: [name] Date of Service: 05/12/2018 8:30 AM Medical Record Number: [id number] Patient Account Number: [id number] Date of Birth/Sex: 1960-11-26 (57 y.o. F) Treating RN: Roger Shelter Primary Care Meko Bellanger: [employer] Other Clinician: Referring Ellicia Alix: [employer] Treating Mariaelena Cade/Extender: Cathie Olden in Treatment: 8 Vital  Signs Height(in): 62 Pulse(bpm): 72 Weight(lbs): 223 Blood Pressure(mmHg): 149/71 Body Mass Index(BMI): 41 Temperature(F): 97.9 Respiratory Rate 18 (breaths/min): Photos: [N/A:N/A] Wound Location: Right Lower Leg - Posterior N/A N/A Wounding Event: Trauma N/A N/A Primary Etiology: Skin Tear N/A N/A Date Acquired: 03/03/2018 N/A N/A Weeks of Treatment: 8 N/A N/A Wound Status: Open N/A N/A Measurements L x W x D 2.8x4.2x0.1 N/A N/A (cm) Area (cm) : 9.236 N/A N/A Volume (cm) : 0.924 N/A N/A % Reduction in Area: 58.80% N/A N/A % Reduction in Volume: 58.80% N/A N/A Classification: Full Thickness With Exposed N/A N/A Support Structures Exudate Amount: Large N/A N/A Exudate Type: Serous N/A N/A Exudate Color: amber N/A N/A Wound Margin: Flat and Intact N/A N/A Granulation Amount: Medium (34-66%) N/A N/A Granulation Quality: Red, Friable N/A N/A Necrotic Amount: Medium (34-66%) N/A N/A Exposed Structures: Fat Layer (Subcutaneous N/A N/A Tissue) Exposed: Yes Fascia: No Tendon: No Muscle: No Joint: No Bone: No Epithelialization: Small (1-33%) N/A N/A HOLLEY, KOCUREK D. ([id number]) Periwound  Skin Texture: Excoriation: Yes N/A N/A Induration: No Callus: No Crepitus: No Rash: No Scarring: No Periwound Skin Moisture: Maceration: Yes N/A N/A Dry/Scaly: No Periwound Skin Color: Erythema: Yes N/A N/A Atrophie Blanche: No Cyanosis: No Ecchymosis: No Hemosiderin Staining: No Mottled: No Pallor: No Rubor: No Erythema Location: Circumferential N/A N/A Temperature: No Abnormality N/A N/A Tenderness on Palpation: Yes N/A N/A Wound Preparation: Ulcer Cleansing: N/A N/A Rinsed/Irrigated with Saline Topical Anesthetic Applied: Other: lidocaine 4% Treatment Notes Wound #1 (Right, Posterior Lower Leg) 3. Peri-wound Care: Antifungal cream 4. Dressing Applied: Medihoney Gel 5. Secondary Dressing Applied Bordered Foam Dressing Electronic Signature(s) Signed: 05/12/2018 9:05:09 AM By: Lawanda Cousins Entered By: Lawanda Cousins on 05/12/2018 09:05:09 [name] ([id number]) -------------------------------------------------------------------------------- [place] Details Patient Name: [name]. Date of Service: 05/12/2018 8:30 AM Medical Record Number: [id number] Patient Account Number: [id number] Date of Birth/Sex: Aug 10, 1961 (56 y.o. F) Treating RN: Roger Shelter Primary Care Cashus Halterman: [employer] Other Clinician: Referring Carlon Chaloux: [employer] Treating Kerston Landeck/Extender: Cathie Olden in Treatment: 8 Active Inactive ` Abuse / Safety / Falls / Self Care Management Nursing Diagnoses: Potential for falls Goals: Patient will not experience any injury related to falls Date Initiated: 03/17/2018 Target Resolution Date: 07/16/2018 Goal Status: Active Interventions: Assess Activities of Daily Living upon admission and as needed Assess fall risk on admission and as needed Assess: immobility, friction, shearing, incontinence upon admission and as needed Notes: ` Nutrition Nursing Diagnoses: Imbalanced nutrition Potential  for alteratiion in Nutrition/Potential for imbalanced nutrition Goals: Patient/caregiver agrees to and verbalizes understanding of need to use nutritional supplements and/or vitamins as prescribed Date Initiated: 03/17/2018 Target Resolution Date: 06/11/2018 Goal Status: Active Interventions: Assess patient nutrition upon admission and as needed per policy Notes: ` Orientation to the Wound Care Program Nursing Diagnoses: Knowledge deficit related to the wound healing center program Goals: Patient/caregiver will verbalize understanding of the [place] Date Initiated: 03/17/2018 Target Resolution Date: 04/16/2018 CECILEY, BUIST ([id number]) Goal Status: Active Interventions: Provide education on orientation to the wound center Notes: ` Pain, Acute or Chronic Nursing Diagnoses: Pain, acute or chronic: actual or potential Potential alteration in comfort, pain Goals: Patient/caregiver will verbalize adequate pain control between visits Date Initiated: 03/17/2018 Target Resolution Date: 06/11/2018 Goal Status: Active Interventions: Complete pain assessment as per visit requirements Notes: ` Wound/Skin Impairment Nursing Diagnoses: Impaired tissue integrity Knowledge deficit related to ulceration/compromised skin integrity Goals: Ulcer/skin breakdown will have a volume reduction of 80% by week  12 Date Initiated: 03/17/2018 Target Resolution Date: 07/09/2018 Goal Status: Active Interventions: Assess patient/caregiver ability to perform ulcer/skin care regimen upon admission and as needed Assess ulceration(s) every visit Notes: Electronic Signature(s) Signed: 05/12/2018 2:41:32 PM By: Roger Shelter Entered By: Roger Shelter on 05/12/2018 08:54:22 [name] ([id number]) -------------------------------------------------------------------------------- Pain Assessment Details Patient Name: [name] Date of Service: 05/12/2018 8:30  AM Medical Record Number: [id number] Patient Account Number: [id number] Date of Birth/Sex: July 14, 1961 (57 y.o. F) Treating RN: Roger Shelter Primary Care Rasheka Denard: [employer] Other Clinician: Referring Primitivo Merkey: [employer] Treating Asah Lamay/Extender: Cathie Olden in Treatment: 8 Active Problems Location of Pain Severity and Description of Pain Patient Has Paino No Site Locations Pain Management and Medication Current Pain Management: Electronic Signature(s) Signed: 05/12/2018 2:41:32 PM By: Roger Shelter Signed: 05/12/2018 4:37:30 PM By: Lorine Bears RCP, RRT, CHT Entered By: Lorine Bears on 05/12/2018 08:31:52 [name] ([id number]) -------------------------------------------------------------------------------- Patient/Caregiver Education Details Patient Name: [name]. Date of Service: 05/12/2018 8:30 AM Medical Record Number: [id number] Patient Account Number: [id number] Date of Birth/Gender: [birth year] (57 y.o. F) Treating RN: Cornell Barman Primary Care Physician: [employer] Other Clinician: Referring Physician: [employer] Treating Physician/Extender: Cathie Olden in Treatment: 8 Education Assessment Education Provided To: Patient Education Topics Provided Wound/Skin Impairment: Handouts: Caring for Your Ulcer, Other: Continue wound care as prescribed Methods: Demonstration, Explain/Verbal Responses: State content correctly Electronic Signature(s) Signed: 05/12/2018 5:22:10 PM By: Gretta Cool, BSN, RN, CWS, Kim RN, BSN Entered By: Gretta Cool, BSN, RN, CWS, Kim on 05/12/2018 09:04:22 [name] ([id number]) -------------------------------------------------------------------------------- Wound Assessment Details Patient Name: [name]. Date of Service: 05/12/2018 8:30 AM Medical Record Number: [id number] Patient Account Number: [id number] Date of Birth/Sex: June 26, 1961 (57 y.o. F) Treating  RN: Montey Hora Primary Care Zayne Marovich: [employer] Other Clinician: Referring Corvin Sorbo: [employer] Treating Elbia Paro/Extender: Cathie Olden in Treatment: 8 Wound Status Wound Number: 1 Primary Etiology: Skin Tear Wound Location: Right Lower Leg - Posterior Wound Status: Open Wounding Event: Trauma Date Acquired: 03/03/2018 Weeks Of Treatment: 8 Clustered Wound: No Photos Photo Uploaded By: Montey Hora on 05/12/2018 08:47:19 Wound Measurements Length: (cm) 2.8 Width: (cm) 4.2 Depth: (cm) 0.1 Area: (cm) 9.236 Volume: (cm) 0.924 % Reduction in Area: 58.8% % Reduction in Volume: 58.8% Epithelialization: Small (1-33%) Tunneling: No Undermining: No Wound Description Full Thickness With Exposed Support Classification: Structures Wound Margin: Flat and Intact Exudate Large Amount: Exudate Type: Serous Exudate Color: amber Foul Odor After Cleansing: No Slough/Fibrino Yes Wound Bed Granulation Amount: Medium (34-66%) Exposed Structure Granulation Quality: Red, Friable Fascia Exposed: No Necrotic Amount: Medium (34-66%) Fat Layer (Subcutaneous Tissue) Exposed: Yes Necrotic Quality: Adherent Slough Tendon Exposed: No Muscle Exposed: No Joint Exposed: No Bone Exposed: No PECOLA, HAXTON D. ([id number]) Periwound Skin Texture Texture Color No Abnormalities Noted: No No Abnormalities Noted: No Callus: No Atrophie Blanche: No Crepitus: No Cyanosis: No Excoriation: Yes Ecchymosis: No Induration: No Erythema: Yes Rash: No Erythema Location: Circumferential Scarring: No Hemosiderin Staining: No Mottled: No Moisture Pallor: No No Abnormalities Noted: No Rubor: No Dry / Scaly: No Maceration: Yes Temperature / Pain Temperature: No Abnormality Tenderness on Palpation: Yes Wound Preparation Ulcer Cleansing: Rinsed/Irrigated with Saline Topical Anesthetic Applied: Other: lidocaine 4%, Treatment Notes Wound #1 (Right, Posterior Lower  Leg) 3. Peri-wound Care: Antifungal cream 4. Dressing Applied: Medihoney Gel 5. Secondary Dressing Applied Bordered Foam Dressing Electronic Signature(s) Signed: 05/12/2018 5:08:53 PM By: Montey Hora Entered By: Montey Hora on 05/12/2018 08:41:05 [name] ([id number]) --------------------------------------------------------------------------------  [place] Details Patient Name: KATRIEL, CUTSFORTH. Date of Service: 05/12/2018 8:30 AM Medical Record Number: [id number] Patient Account Number: [id number] Date of Birth/Sex: 10/18/1960 (57 y.o. F) Treating RN: Roger Shelter Primary Care Deshae Dickison: [employer] Other Clinician: Referring Aletha Allebach: [employer] Treating Kaspar Albornoz/Extender: Cathie Olden in Treatment: 8 Vital Signs Time Taken: 08:32 Temperature (F): 97.9 Height (in): 62 Pulse (bpm): 72 Weight (lbs): 223 Respiratory Rate (breaths/min): 18 Body Mass Index (BMI): 40.8 Blood Pressure (mmHg): 149/71 Reference Range: 80 - 120 mg / dl Electronic Signature(s) Signed: 05/12/2018 4:37:30 PM By: Lorine Bears RCP, RRT, CHT Entered By: Lorine Bears on 05/12/2018 08:34:18

## 2018-05-15 NOTE — Progress Notes (Signed)
WALLY, BEHAN ([id number]) Visit Report for 05/12/2018 Chief Complaint Document Details Patient Name: [name], [name]. Date of Service: 05/12/2018 8:30 AM Medical Record Number: [id number] Patient Account Number: [id number] Date of Birth/Sex: 04-Mar-1961 (57 y.o. F) Treating RN: Roger Shelter Primary Care Provider: [employer] Other Clinician: Referring Provider: [employer] Treating Provider/Extender: Cathie Olden in Treatment: 8 Information Obtained from: Patient Chief Complaint right posterior calf ulcer Electronic Signature(s) Signed: 05/12/2018 9:05:16 AM By: Lawanda Cousins Entered By: Lawanda Cousins on 05/12/2018 09:05:16 [name] ([id number]) -------------------------------------------------------------------------------- HPI Details Patient Name: [name]. Date of Service: 05/12/2018 8:30 AM Medical Record Number: [id number] Patient Account Number: [id number] Date of Birth/Sex: 03-Jan-1961 (57 y.o. F) Treating RN: Roger Shelter Primary Care Provider: [employer] Other Clinician: Referring Provider: [employer] Treating Provider/Extender: Cathie Olden in Treatment: 8 History of Present Illness HPI Description: 03/17/18-She is here for initial evaluation of the right posterior calf ulcer. She states that roughly 3 weeks ago, she [place] the beach, and abraded her right calf on a bicycle tire, developing a non-blistered hematoma. She states presently 3 days after that she lacerated the hematoma on a table. After returning home she went to urgent care, had Steri-Strips placed and was prescribed Cefdnir and topical Polysporin. She states the pain is improved. She denies wearing any compression therapy. she is nondiabetic and does not smoke 03/24/18-She is here for evaluation for a right posterior calf ulcer. She did not receive supplies as ordered last week secondary to insurance coverage. She has been applying medical honey  and Ace wrap compression, although she admits that she is unable to apply Ace wrap at same compression as placed here in the clinic. She tolerated debridement today we will continue with same treatment plan she'll follow-up next week. She has been encouraged to contact her insurance company regarding supply company coverage. 03/31/18-She is here for evaluation of her right posterior calf ulcer. She continues to make improvement with granulation tissue throughout, minimal amount of nonviable tissue. We will continue the same treatment plan and she will follow-up next week 04/07/18-She is here in follow-up evaluation for right posterior calf ulcer. She continues to make improvement. We'll continue with the same treatment plan and she'll follow-up in 2 weeks. 04/28/18-She is seen in follow up evaluation of her right posterior calf ulcer. There continues to be improvement. She has irregular epithelial islands throughout the wound margin. There is periwound erythema, secondary to irritation from product (latex cause), she states erythema has been present for "weeks" and is unchanged. She will continue with medihoney follow- up in 2 weeks 05/12/18-She is seen in follow-up evaluation for right posterior calf ulcer. This is nearly healed. She will continue with same treatment plan and follow-up in 2 weeks where anticipate she will be discharged. She continues to have irritation from product use, she has been encouraged to use over-the-counter hydrocortisone cream. She does admit to recent visit to the ER for cat bite, was prescribed augmentin; she stopped taking after one dose d/t facial rash/itching. Electronic Signature(s) Signed: 05/12/2018 9:08:52 AM By: Lawanda Cousins Entered By: Lawanda Cousins on 05/12/2018 09:08:51 [name] ([id number]) -------------------------------------------------------------------------------- Physician Orders Details Patient Name: Rachel Docker D. Date of Service:  05/12/2018 8:30 AM Medical Record Number: [id number] Patient Account Number: [id number] Date of Birth/Sex: 1961-02-27 (57 y.o. F) Treating RN: Roger Shelter Primary Care Provider: [employer] Other Clinician: Referring Provider: [employer] Treating Provider/Extender: Cathie Olden in Treatment: 8 Verbal / Phone  Orders: No Diagnosis Coding Wound Cleansing Wound #1 Right,Posterior Lower Leg o Clean wound with Normal Saline. o May shower with protection. Anesthetic (add to Medication List) Wound #1 Right,Posterior Lower Leg o Topical Lidocaine 4% cream applied to wound bed prior to debridement (In Clinic Only). Skin Barriers/Peri-Wound Care Wound #1 Right,Posterior Lower Leg o Triamcinolone Acetonide Ointment (TCA) Primary Wound Dressing Wound #1 Right,Posterior Lower Leg o Medihoney gel Secondary Dressing Wound #1 Right,Posterior Lower Leg o Boardered Foam Dressing Dressing Change Frequency Wound #1 Right,Posterior Lower Leg o Change dressing every day. Follow-up Appointments Wound #1 Right,Posterior Lower Leg o Return Appointment in 2 weeks. Edema Control Wound #1 Right,Posterior Lower Leg o Elevate legs to the level of the heart and pump ankles as often as possible Additional Orders / Instructions Wound #1 Right,Posterior Lower Leg o Increase protein intake. Electronic Signature(s) Signed: 05/12/2018 2:41:32 PM By: Darrow Bussing, [name] ([id number]) Signed: 05/12/2018 6:16:13 PM By: Lawanda Cousins Entered By: Roger Shelter on 05/12/2018 08:55:24 [name] ([id number]) -------------------------------------------------------------------------------- Problem List Details Patient Name: [name], [name]. Date of Service: 05/12/2018 8:30 AM Medical Record Number: [id number] Patient Account Number: [id number] Date of Birth/Sex: [birth year] (57 y.o. F) Treating RN: Roger Shelter Primary Care Provider: [employer] Other  Clinician: Referring Provider: [employer] Treating Provider/Extender: Cathie Olden in Treatment: 8 Active Problems ICD-10 Evaluated Encounter Code Description Active Date Today Diagnosis L97.212 Non-pressure chronic ulcer of right calf with fat layer exposed 03/17/2018 No Yes Inactive Problems Resolved Problems Electronic Signature(s) Signed: 05/12/2018 9:05:02 AM By: Lawanda Cousins Entered By: Lawanda Cousins on 05/12/2018 09:05:02 [name] ([id number]) -------------------------------------------------------------------------------- Progress Note Details Patient Name: Rachel Docker D. Date of Service: 05/12/2018 8:30 AM Medical Record Number: [id number] Patient Account Number: [id number] Date of Birth/Sex: 10/29/1960 (57 y.o. F) Treating RN: Roger Shelter Primary Care Provider: [employer] Other Clinician: Referring Provider: [employer] Treating Provider/Extender: Cathie Olden in Treatment: 8 Subjective Chief Complaint Information obtained from Patient right posterior calf ulcer History of Present Illness (HPI) 03/17/18-She is here for initial evaluation of the right posterior calf ulcer. She states that roughly 3 weeks ago, she was at a time at the beach, and abraded her right calf on a bicycle tire, developing a non-blistered hematoma. She states presently 3 days after that she lacerated the hematoma on a table. After returning home she went to urgent care, had Steri-Strips placed and was prescribed Cefdnir and topical Polysporin. She states the pain is improved. She denies wearing any compression therapy. she is nondiabetic and does not smoke 03/24/18-She is here for evaluation for a right posterior calf ulcer. She did not receive supplies as ordered last week secondary to insurance coverage. She has been applying medical honey and Ace wrap compression, although she admits that she is unable to apply Ace wrap at same compression as placed here  in the clinic. She tolerated debridement today we will continue with same treatment plan she'll follow-up next week. She has been encouraged to contact her insurance company regarding supply company coverage. 03/31/18-She is here for evaluation of her right posterior calf ulcer. She continues to make improvement with granulation tissue throughout, minimal amount of nonviable tissue. We will continue the same treatment plan and she will follow-up next week 04/07/18-She is here in follow-up evaluation for right posterior calf ulcer. She continues to make improvement. We'll continue with the same treatment plan and she'll follow-up in 2 weeks. 04/28/18-She is seen in follow up evaluation of her right posterior calf  ulcer. There continues to be improvement. She has irregular epithelial islands throughout the wound margin. There is periwound erythema, secondary to irritation from product (latex cause), she states erythema has been present for "weeks" and is unchanged. She will continue with medihoney follow- up in 2 weeks 05/12/18-She is seen in follow-up evaluation for right posterior calf ulcer. This is nearly healed. She will continue with same treatment plan and follow-up in 2 weeks where anticipate she will be discharged. She continues to have irritation from product use, she has been encouraged to use over-the-counter hydrocortisone cream. She does admit to recent visit to the ER for cat bite, was prescribed augmentin; she stopped taking after one dose d/t facial rash/itching. Allergies Augmentin (Reaction: facial swelling) [name], [name] ([id number]) Objective Constitutional Vitals Time Taken: 8:32 AM, Height: 62 in, Weight: 223 lbs, BMI: 40.8, Temperature: 97.9 F, Pulse: 72 bpm, Respiratory Rate: 18 breaths/min, Blood Pressure: 149/71 mmHg. Integumentary (Hair, Skin) Wound #1 status is Open. Original cause of wound was Trauma. The wound is located on the Right,Posterior Lower Leg. The wound  measures 2.8cm length x 4.2cm width x 0.1cm depth; 9.236cm^2 area and 0.924cm^3 volume. There is Fat Layer (Subcutaneous Tissue) Exposed exposed. There is no tunneling or undermining noted. There is a large amount of serous drainage noted. The wound margin is flat and intact. There is medium (34-66%) red, friable granulation within the wound bed. There is a medium (34-66%) amount of necrotic tissue within the wound bed including Adherent Slough. The periwound skin appearance exhibited: Excoriation, Maceration, Erythema. The periwound skin appearance did not exhibit: Callus, Crepitus, Induration, Rash, Scarring, Dry/Scaly, Atrophie Blanche, Cyanosis, Ecchymosis, Hemosiderin Staining, Mottled, Pallor, Rubor. The surrounding wound skin color is noted with erythema which is circumferential. Periwound temperature was noted as No Abnormality. The periwound has tenderness on palpation. Assessment Active Problems ICD-10 Non-pressure chronic ulcer of right calf with fat layer exposed Plan Wound Cleansing: Wound #1 Right,Posterior Lower Leg: Clean wound with Normal Saline. May shower with protection. Anesthetic (add to Medication List): Wound #1 Right,Posterior Lower Leg: Topical Lidocaine 4% cream applied to wound bed prior to debridement (In Clinic Only). Skin Barriers/Peri-Wound Care: Wound #1 Right,Posterior Lower Leg: Triamcinolone Acetonide Ointment (TCA) Primary Wound Dressing: Wound #1 Right,Posterior Lower Leg: Medihoney gel Secondary Dressing: Wound #1 Right,Posterior Lower Leg: Boardered Foam Dressing Dressing Change Frequency: Wound #1 Right,Posterior Lower Leg: Change dressing every day. Follow-up Appointments: Wound #1 Right,Posterior Lower Leg: Return Appointment in 2 weeks. [name], [name]. ([id number]) Edema Control: Wound #1 Right,Posterior Lower Leg: Elevate legs to the level of the heart and pump ankles as often as possible Additional Orders / Instructions: Wound #1  Right,Posterior Lower Leg: Increase protein intake. Electronic Signature(s) Signed: 05/12/2018 9:12:36 AM By: Lawanda Cousins Entered By: Lawanda Cousins on 05/12/2018 09:12:35 [name] ([id number]) -------------------------------------------------------------------------------- SuperBill Details Patient Name: Rachel Docker D. Date of Service: 05/12/2018 Medical Record Number: [id number] Patient Account Number: [id number] Date of Birth/Sex: 1961/02/17 (57 y.o. F) Treating RN: Roger Shelter Primary Care Provider: [employer] Other Clinician: Referring Provider: [employer] Treating Provider/Extender: Cathie Olden in Treatment: 8 Diagnosis Coding ICD-10 Codes Code Description [phone] Non-pressure chronic ulcer of right calf with fat layer exposed Facility Procedures CPT4 Code: [id number] Description: [phone] - WOUND CARE VISIT-LEV 2 EST PT Modifier: Quantity: 1 Physician Procedures CPT4 Code: [id number] Description: 62836 - WC PHYS LEVEL 3 - EST PT ICD-10 Diagnosis Description L97.212 Non-pressure chronic ulcer of right calf with fat layer exp Modifier: osed Quantity: 1 Electronic  Signature(s) Signed: 05/12/2018 9:12:54 AM By: Lawanda Cousins Entered By: Lawanda Cousins on 05/12/2018 09:12:53

## 2018-05-26 ENCOUNTER — Ambulatory Visit: Payer: PRIVATE HEALTH INSURANCE | Admitting: Physician Assistant

## 2018-09-02 ENCOUNTER — Other Ambulatory Visit (HOSPITAL_COMMUNITY): Payer: Self-pay | Admitting: Obstetrics and Gynecology

## 2018-09-02 DIAGNOSIS — Z1231 Encounter for screening mammogram for malignant neoplasm of breast: Secondary | ICD-10-CM

## 2018-09-05 ENCOUNTER — Ambulatory Visit (HOSPITAL_COMMUNITY): Payer: PRIVATE HEALTH INSURANCE

## 2018-10-13 ENCOUNTER — Ambulatory Visit (HOSPITAL_COMMUNITY)
Admission: RE | Admit: 2018-10-13 | Discharge: 2018-10-13 | Disposition: A | Discharge status: Home / Self Care | Payer: PRIVATE HEALTH INSURANCE | Source: Ambulatory Visit | Attending: Obstetrics and Gynecology | Admitting: Obstetrics and Gynecology

## 2018-10-13 DIAGNOSIS — Z1231 Encounter for screening mammogram for malignant neoplasm of breast: Secondary | ICD-10-CM | POA: Insufficient documentation

## 2019-11-10 ENCOUNTER — Other Ambulatory Visit (HOSPITAL_COMMUNITY): Payer: Self-pay | Admitting: Obstetrics and Gynecology

## 2019-11-10 DIAGNOSIS — Z1231 Encounter for screening mammogram for malignant neoplasm of breast: Secondary | ICD-10-CM

## 2019-11-15 ENCOUNTER — Ambulatory Visit (HOSPITAL_COMMUNITY)
Admission: RE | Admit: 2019-11-15 | Discharge: 2019-11-15 | Disposition: A | Discharge status: Home / Self Care | Payer: PRIVATE HEALTH INSURANCE | Source: Ambulatory Visit | Attending: Obstetrics and Gynecology | Admitting: Obstetrics and Gynecology

## 2019-11-15 ENCOUNTER — Other Ambulatory Visit (HOSPITAL_COMMUNITY): Payer: Self-pay | Admitting: Obstetrics and Gynecology

## 2019-11-15 ENCOUNTER — Other Ambulatory Visit: Payer: Self-pay

## 2019-11-15 DIAGNOSIS — Z1231 Encounter for screening mammogram for malignant neoplasm of breast: Secondary | ICD-10-CM | POA: Diagnosis present

## 2019-11-16 ENCOUNTER — Other Ambulatory Visit (HOSPITAL_COMMUNITY): Payer: Self-pay | Admitting: Obstetrics and Gynecology

## 2019-11-16 DIAGNOSIS — R928 Other abnormal and inconclusive findings on diagnostic imaging of breast: Secondary | ICD-10-CM

## 2019-11-17 ENCOUNTER — Ambulatory Visit: Payer: PRIVATE HEALTH INSURANCE | Attending: Internal Medicine

## 2019-11-17 DIAGNOSIS — Z23 Encounter for immunization: Secondary | ICD-10-CM

## 2019-11-17 NOTE — Progress Notes (Signed)
   Covid-19 Vaccination Clinic  Name:  [name]    MRN: [id number] DOB: 1961-05-27  11/17/2019  [name] was observed post Covid-19 immunization for 15 minutes without incident. She was provided with Vaccine Information Sheet and instruction to access the V-Safe system.   [name] was instructed to call 911 with any severe reactions post vaccine: Marland Kitchen Difficulty breathing  . Swelling of face and throat  . A fast heartbeat  . A bad rash all over body  . Dizziness and weakness   Immunizations Administered    Name Date Dose VIS Date Route   Moderna COVID-19 Vaccine 11/17/2019  8:34 AM 0.5 mL 08/08/2019 Intramuscular   Manufacturer: Moderna   Lot: [id number]   [id number]

## 2019-11-28 ENCOUNTER — Encounter (HOSPITAL_COMMUNITY): Payer: PRIVATE HEALTH INSURANCE

## 2019-11-28 ENCOUNTER — Other Ambulatory Visit (HOSPITAL_COMMUNITY): Payer: PRIVATE HEALTH INSURANCE

## 2019-12-04 ENCOUNTER — Other Ambulatory Visit (HOSPITAL_COMMUNITY): Payer: Self-pay | Admitting: Internal Medicine

## 2019-12-04 DIAGNOSIS — R0600 Dyspnea, unspecified: Secondary | ICD-10-CM

## 2019-12-05 ENCOUNTER — Ambulatory Visit (HOSPITAL_COMMUNITY)
Admission: RE | Admit: 2019-12-05 | Discharge: 2019-12-05 | Disposition: A | Discharge status: Home / Self Care | Payer: PRIVATE HEALTH INSURANCE | Source: Ambulatory Visit | Attending: Obstetrics and Gynecology | Admitting: Obstetrics and Gynecology

## 2019-12-05 ENCOUNTER — Ambulatory Visit (HOSPITAL_COMMUNITY)
Admission: RE | Admit: 2019-12-05 | Discharge: 2019-12-05 | Disposition: A | Discharge status: Home / Self Care | Payer: PRIVATE HEALTH INSURANCE | Source: Ambulatory Visit | Attending: Internal Medicine | Admitting: Internal Medicine

## 2019-12-05 ENCOUNTER — Other Ambulatory Visit: Payer: Self-pay

## 2019-12-05 DIAGNOSIS — R928 Other abnormal and inconclusive findings on diagnostic imaging of breast: Secondary | ICD-10-CM | POA: Diagnosis present

## 2019-12-05 DIAGNOSIS — R0600 Dyspnea, unspecified: Secondary | ICD-10-CM

## 2019-12-19 ENCOUNTER — Ambulatory Visit: Payer: PRIVATE HEALTH INSURANCE | Attending: Internal Medicine

## 2019-12-19 DIAGNOSIS — Z23 Encounter for immunization: Secondary | ICD-10-CM

## 2019-12-19 NOTE — Progress Notes (Signed)
   Covid-19 Vaccination Clinic  Name:  [name]    MRN: [id number] DOB: 1960-12-23  12/19/2019  Ms. [name] was observed post Covid-19 immunization for 15 minutes without incident. She was provided with Vaccine Information Sheet and instruction to access the V-Safe system.   Ms. [name] was instructed to call 911 with any severe reactions post vaccine: Marland Kitchen Difficulty breathing  . Swelling of face and throat  . A fast heartbeat  . A bad rash all over body  . Dizziness and weakness   Immunizations Administered    Name Date Dose VIS Date Route   Moderna COVID-19 Vaccine 12/19/2019 10:17 AM 0.5 mL 08/08/2019 Intramuscular   Manufacturer: Moderna   LotHQ:[id number]   [place]:[id number]

## 2020-05-09 ENCOUNTER — Encounter: Payer: Self-pay | Admitting: Emergency Medicine

## 2020-05-09 ENCOUNTER — Ambulatory Visit
Admission: EM | Admit: 2020-05-09 | Discharge: 2020-05-09 | Disposition: A | Discharge status: Home / Self Care | Payer: PRIVATE HEALTH INSURANCE | Attending: Emergency Medicine | Admitting: Emergency Medicine

## 2020-05-09 ENCOUNTER — Other Ambulatory Visit: Payer: Self-pay

## 2020-05-09 DIAGNOSIS — Z5189 Encounter for other specified aftercare: Secondary | ICD-10-CM

## 2020-05-09 MED ORDER — DOXYCYCLINE HYCLATE 100 MG PO CAPS: 100.0000 mg | ORAL_CAPSULE | Freq: Two times a day (BID) | ORAL | 0 refills | Status: DC

## 2020-05-09 NOTE — Discharge Instructions (Signed)
Clean with warm water and mild soap.  Change dressing daily and apply a thin layer of antibiotic ointment. Antibiotic as prescribed take as directed Take OTC ibuprofen or tylenol as needed for pain releif Return sooner or go to the ED if you have any new or worsening symptoms such as increased pain, redness, swelling, drainage, discharge, decreased range of motion of extremity, etc..

## 2020-05-09 NOTE — ED Provider Notes (Addendum)
[facility]   [id number] 05/09/20 Arrival Time: 1914  Chief Complaint  Patient presents with   Wound Check     SUBJECTIVE:  [name] is a 59 y.o. female who presented to the urgent care with a complaint of right lower leg wound for the past 3 weeks.  Symptoms began after she was scratched by dog.   Currently not on blood thinners.  Denies similar symptoms in the past.  Denies fever, chills, nausea, vomiting, redness, swelling, purulent drainage, decrease strength or sensation.   Td UTD: Yes.  ROS: As per HPI.  All other pertinent ROS negative.     Past Medical History:  Diagnosis Date   Arthritis    Cancer ([place])    Skin on left hand   DDD (degenerative disc disease), lumbar    GERD (gastroesophageal reflux disease)    Uses OTC medication as needed   Mitral valve prolapse    Pneumonia    many many years ago   Prolapse of mitral valve    Scoliosis    Past Surgical History:  Procedure Laterality Date   ANTERIOR LAT LUMBAR FUSION Left 10/22/2016   Procedure: Anterior Lateral Lumbar Fusion - Lumbar two - lumbar three, with Lateral plate;  Surgeon: Kary Kos, MD;  Location: [place];  Service: Neurosurgery;  Laterality: Left;  Anterior Lateral Lumbar Fusion - Lumbar two - lumbar three, with Lateral plate   CHOLECYSTECTOMY  3154   CO2 LASER APPLICATION N/A 00/86/7619   Procedure: CO2 LASER APPLICATION;  Surgeon: Gus Height, MD;  Location: [facility];  Service: Gynecology;  Laterality: N/A;   COLONOSCOPY W/ POLYPECTOMY     LEG SURGERY  1983   dicron artery   NECK SURGERY  2011   fusion   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, 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   OPEN REDUCTION INTERNAL FIXATION (ORIF) METACARPAL Right 08/12/2017   Procedure: OPEN REDUCTION INTERNAL FIXATION (ORIF) 5th METACARPAL fracture;  Surgeon: Roseanne Kaufman, MD;  Location: [place];  Service: Orthopedics;  Laterality: Right;  60 mins   SPINAL FIXATION SURGERY  1973   age 38 - sclosis     TOTAL HIP ARTHROPLASTY Right 2011   UPPER GASTROINTESTINAL ENDOSCOPY     VULVECTOMY N/A 09/18/2015   Procedure: WIDE EXCISION VULVECTOMY;  Surgeon: Vanessa Kick, MD;  Location: [facility];  Service: Gynecology;  Laterality: N/A;   No Known Allergies No current facility-administered medications on file prior to encounter.   Current Outpatient Medications on File Prior to Encounter  Medication Sig Dispense Refill   amoxicillin-clavulanate (AUGMENTIN) 875-125 MG tablet Take 1 tablet by mouth every 12 (twelve) hours. 14 tablet 0   Cimetidine (TAGAMET PO) Take 1 tablet by mouth daily as needed (indigestion).     cyclobenzaprine (FLEXERIL) 10 MG tablet Take 1 tablet (10 mg total) by mouth 3 (three) times daily as needed for muscle spasms. 60 tablet 0   diphenhydrAMINE (BENADRYL) 25 MG tablet Take 25 mg by mouth at bedtime.     estrogens-methylTEST 1.25-2.5 MG TABS tablet Take 1 tablet by mouth daily.     HYDROcodone-acetaminophen (NORCO) 5-325 MG tablet Take 2 tablets by mouth every 4 (four) hours as needed for moderate pain. 42 tablet 0   HYDROcodone-acetaminophen (NORCO/VICODIN) 5-325 MG tablet Take 1-2 tablets by mouth every 4 (four) hours as needed for severe pain. 15 tablet 0   medroxyPROGESTERone (PROVERA) 2.5 MG tablet Take 2.5 mg by mouth daily.  meloxicam (MOBIC) 15 MG tablet Take 15 mg by mouth daily.     temazepam (RESTORIL) 15 MG capsule Take 15 mg by mouth at bedtime as needed for sleep.     traMADol (ULTRAM) 50 MG tablet Take 100 mg by mouth 2 (two) times daily as needed for moderate pain.      Social History   Socioeconomic History   Marital status: Married    Spouse name: Not on file   Number of children: Not on file   Years of education: Not on file   Highest education level: Not on file  Occupational History   Occupation: [employer]  Tobacco Use   Smoking status: Former Smoker    Packs/day: 1.00    Years: 20.00    Pack years: 20.00    Types:  Cigarettes    Quit date: 09/07/2002    Years since quitting: 17.6   Smokeless tobacco: Never Used  Scientific laboratory technician Use: Never used  Substance and Sexual Activity   Alcohol use: No    Comment: occ   Drug use: No   Sexual activity: Yes  Other Topics Concern   Not on file  Social History Narrative   Not on file   Social Determinants of Health   Financial Resource Strain:    Difficulty of Paying Living Expenses: Not on file  Food Insecurity:    Worried About Charity fundraiser in the Last Year: Not on file   [employer] of Food in the Last Year: Not on file  Transportation Needs:    Lack of Transportation (Medical): Not on file   Lack of Transportation (Non-Medical): Not on file  Physical Activity:    Days of Exercise per Week: Not on file   Minutes of Exercise per Session: Not on file  Stress:    Feeling of Stress : Not on file  Social Connections:    Frequency of Communication with Friends and Family: Not on file   Frequency of Social Gatherings with Friends and Family: Not on file   Attends Religious Services: Not on file   Active Member of Clubs or Organizations: Not on file   Attends Archivist Meetings: Not on file   Marital Status: Not on file  Intimate Partner Violence:    Fear of Current or Ex-Partner: Not on file   Emotionally Abused: Not on file   Physically Abused: Not on file   Sexually Abused: Not on file   Family History  Problem Relation Age of Onset   Diabetes Other    Heart disease Other    COPD Mother    Diabetes Father    Pneumonia Father    Alcoholism Father    Colon cancer Neg Hx      OBJECTIVE:  Vitals:   05/09/20 2008 05/09/20 2010  BP:  (!) 147/76  Pulse:  (!) 107  Resp:  18  Temp:  98.2 F (36.8 C)  TempSrc:  Oral  SpO2:  95%  Weight: 212 lb (96.2 kg)   Height: 5\' 2"  (1.575 m)      General appearance: alert; no distress Heart: RRR, no rub, murmur or gallop Chest: Chest CTA, heart  sounds normal Skin: l wound of right lower extremities approximate 5 x 4 cm; tender with dark eschar. Psychological: alert and cooperative; normal mood and affect   Results for orders placed or performed during the hospital encounter of 08/12/17  Surgical pcr screen   Specimen: Nasal Mucosa; Nasal  Swab  Result Value Ref Range   MRSA, PCR NEGATIVE NEGATIVE   Staphylococcus aureus NEGATIVE NEGATIVE  I-STAT 4, (NA,K, GLUC, HGB,HCT)  Result Value Ref Range   Sodium 141 135 - 145 mmol/L   Potassium 3.9 3.5 - 5.1 mmol/L   Glucose, Bld 85 65 - 99 mg/dL   HCT 42.0 36 - 46 %   Hemoglobin 14.3 12.0 - 15.0 g/dL    Labs Reviewed - No data to display  No results found.   ASSESSMENT & PLAN:  1. Encounter for wound re-check     Meds ordered this encounter  Medications   doxycycline (VIBRAMYCIN) 100 MG capsule    Sig: Take 1 capsule (100 mg total) by mouth 2 (two) times daily.    Dispense:  20 capsule    Refill:  0   Discharge instructions  Clean with warm water and mild soap.  Change dressing daily and apply a thin layer of antibiotic ointment. Antibiotic as prescribed take as directed Take OTC ibuprofen or tylenol as needed for pain releif Return sooner or go to the ED if you have any new or worsening symptoms such as increased pain, redness, swelling, drainage, discharge, decreased range of motion of extremity, etc..     Reviewed expectations re: course of current medical issues. Questions answered. Outlined signs and symptoms indicating need for more acute intervention. Patient verbalized understanding. After Visit Summary given.  Note: This document was prepared using Dragon voice recognition software and may include unintentional dictation errors.    Emerson Monte, FNP 05/09/20 2031    Emerson Monte, FNP 05/09/20 2034

## 2020-05-09 NOTE — ED Triage Notes (Signed)
RT lower leg got scratched by dogs paw on 08/15.  Pt has completed one round of abx but feels like it may be infected.

## 2020-05-29 ENCOUNTER — Other Ambulatory Visit: Payer: Self-pay

## 2020-05-29 ENCOUNTER — Emergency Department (HOSPITAL_COMMUNITY)
Admission: EM | Admit: 2020-05-29 | Discharge: 2020-05-29 | Disposition: A | Discharge status: Home / Self Care | Payer: PRIVATE HEALTH INSURANCE | Attending: Emergency Medicine | Admitting: Emergency Medicine

## 2020-05-29 ENCOUNTER — Encounter (HOSPITAL_COMMUNITY): Payer: Self-pay | Admitting: *Deleted

## 2020-05-29 DIAGNOSIS — Z87891 Personal history of nicotine dependence: Secondary | ICD-10-CM | POA: Insufficient documentation

## 2020-05-29 DIAGNOSIS — Z5189 Encounter for other specified aftercare: Secondary | ICD-10-CM

## 2020-05-29 DIAGNOSIS — Z96641 Presence of right artificial hip joint: Secondary | ICD-10-CM | POA: Diagnosis not present

## 2020-05-29 DIAGNOSIS — L03115 Cellulitis of right lower limb: Secondary | ICD-10-CM | POA: Insufficient documentation

## 2020-05-29 DIAGNOSIS — Z85828 Personal history of other malignant neoplasm of skin: Secondary | ICD-10-CM | POA: Insufficient documentation

## 2020-05-29 LAB — BASIC METABOLIC PANEL
Anion gap: 9 (ref 5–15)
BUN: 23 mg/dL — ABNORMAL HIGH (ref 6–20)
CO2: 27 mmol/L (ref 22–32)
Calcium: 9.3 mg/dL (ref 8.9–10.3)
Chloride: 102 mmol/L (ref 98–111)
Creatinine, Ser: 0.74 mg/dL (ref 0.44–1.00)
GFR calc Af Amer: >60 mL/min (ref >60)
GFR calc non Af Amer: >60 mL/min (ref >60)
Glucose, Bld: 96 mg/dL (ref 70–99)
Potassium: 4.3 mmol/L (ref 3.5–5.1)
Sodium: 138 mmol/L (ref 135–145)

## 2020-05-29 LAB — CBC WITH DIFFERENTIAL/PLATELET
Abs Immature Granulocytes: 0.03 10*3/uL (ref 0.00–0.07)
Basophils Absolute: 0.1 10*3/uL (ref 0.0–0.1)
Basophils Relative: 1 %
Eosinophils Absolute: 0.5 10*3/uL (ref 0.0–0.5)
Eosinophils Relative: 6 %
HCT: 44.7 % (ref 36.0–46.0)
Hemoglobin: 14.3 g/dL (ref 12.0–15.0)
Immature Granulocytes: 0 %
Lymphocytes Relative: 24 %
Lymphs Abs: 2 10*3/uL (ref 0.7–4.0)
MCH: 32.2 pg (ref 26.0–34.0)
MCHC: 32 g/dL (ref 30.0–36.0)
MCV: 100.7 fL — ABNORMAL HIGH (ref 80.0–100.0)
Monocytes Absolute: 0.7 10*3/uL (ref 0.1–1.0)
Monocytes Relative: 9 %
Neutro Abs: 4.9 10*3/uL (ref 1.7–7.7)
Neutrophils Relative %: 60 %
Platelets: 307 10*3/uL (ref 150–400)
RBC: 4.44 MIL/uL (ref 3.87–5.11)
RDW: 12.3 % (ref 11.5–15.5)
WBC: 8.1 10*3/uL (ref 4.0–10.5)
nRBC: 0 % (ref 0.0–0.2)

## 2020-05-29 MED ORDER — LINEZOLID 600 MG PO TABS: 600.0000 mg | ORAL_TABLET | Freq: Two times a day (BID) | ORAL | 0 refills | Status: DC

## 2020-05-29 MED ORDER — LINEZOLID 600 MG PO TABS
600.0000 mg | ORAL_TABLET | Freq: Once | ORAL | Status: DC
  Filled 2020-05-29 (×2): qty 1

## 2020-05-29 MED ORDER — HYDROCODONE-ACETAMINOPHEN 5-325 MG PO TABS: 1.0000 | ORAL_TABLET | Freq: Four times a day (QID) | ORAL | 0 refills | Status: DC | PRN

## 2020-05-29 MED ORDER — LINEZOLID 600 MG PO TABS: 600.0000 mg | ORAL_TABLET | Freq: Two times a day (BID) | ORAL | 0 refills | Status: AC

## 2020-05-29 NOTE — ED Triage Notes (Signed)
Wound to right lower leg for 5 weeks, stats she was injured by a puppy's paw, has taken 2 rounds of antibiotics

## 2020-05-29 NOTE — Discharge Instructions (Addendum)
At this time there does not appear to be the presence of an emergent medical condition, however there is always the potential for conditions to change. Please read and follow the below instructions.  Please return to the Emergency Department immediately for any new or worsening symptoms. Please be sure to follow up with your Primary Care Provider within one week regarding your visit today; please call their office to schedule an appointment even if you are feeling better for a follow-up visit. Please take your antibiotic Linezolid as prescribed until complete to help with your symptoms.  Please drink enough water to avoid dehydration and get plenty of rest.  Please have your primary care provider recheck your blood work next week. Please have your wound rechecked in 3-4 days by your primary care provider.  You may also have your wound rechecked by the wound care center or here at the emergency department if needed.  Return to emergency department immediately if you have any new or worsening symptoms. Do not take tramadol or Flexeril while taking linezolid as this could cause serious life-threatening adverse reactions. You have been given a short prescription of hydrocodone to help replace your tramadol for the next few days.  Please discuss with your primary care provider for further refills of pain medication.  Again you cannot under any circumstance take tramadol with linezolid.  Go to the nearest emergency department immediately if: You have fever or chills. You feel very sleepy. You throw up (vomit) or have watery poop (diarrhea) for a long time. You see red streaks coming from the area. Your red area gets larger. Your red area turns dark in color. You have any new/concerning or worsening of symptoms These symptoms may represent a serious problem that is an emergency. Do not wait to see if the symptoms will go away. Get medical help right away. Call your local emergency services (911 in the  [place] Do not drive yourself to the hospital.   Please read the additional information packets attached to your discharge summary.  Do not take your medicine if  develop an itchy rash, swelling in your mouth or lips, or difficulty breathing; call 911 and seek immediate emergency medical attention if this occurs.  You may review your lab tests and imaging results in their entirety on your MyChart account.  Please discuss all results of fully with your primary care provider and other specialist at your follow-up visit.  Note: Portions of this text may have been transcribed using voice recognition software. Every effort was made to ensure accuracy; however, inadvertent computerized transcription errors may still be present.

## 2020-05-29 NOTE — ED Provider Notes (Addendum)
[facility] EMERGENCY DEPARTMENT Provider Note   CSN: [id number] Arrival date & time: 05/29/20  1612     History Chief Complaint  Patient presents with   Wound Check    [name] is a 59 y.o. female presents today for evaluation of right lower leg wound.  Patient suffered dog scratch to her anterior lower leg 6 weeks ago, shortly after the incident she was started on Augmentin by her primary care provider.  She reports wound gradually increased in size, around 2 weeks ago she was started on doxycycline by an urgent care.  She reports that the wound has not worsened in the past 2 weeks but has persisted, she describes a mild itching aching pain constant nonradiating worsened with palpation improved with rest.  She reports she has had some white-yellow drainage from the area.  Denies fever/chills, abdominal pain, nausea/vomiting, chest pain/shortness of breath, numbness/weakness, tingling, joint pain/swelling or any additional concerns.  HPI     Past Medical History:  Diagnosis Date   Arthritis    Cancer ([place])    Skin on left hand   DDD (degenerative disc disease), lumbar    GERD (gastroesophageal reflux disease)    Uses OTC medication as needed   Mitral valve prolapse    Pneumonia    many many years ago   Prolapse of mitral valve    Scoliosis     Patient Active Problem List   Diagnosis Date Noted   Spinal stenosis 10/22/2016   Hip pain 11/29/2012   Lumbago 11/29/2012   Osteoarthritis 05/10/2012   S/P cholecystectomy 05/10/2012   GERD (gastroesophageal reflux disease) 05/10/2012    Past Surgical History:  Procedure Laterality Date   ANTERIOR LAT LUMBAR FUSION Left 10/22/2016   Procedure: Anterior Lateral Lumbar Fusion - Lumbar two - lumbar three, with Lateral plate;  Surgeon: Kary Kos, MD;  Location: [facility];  Service: Neurosurgery;  Laterality: Left;  Anterior Lateral Lumbar Fusion - Lumbar two - lumbar three, with Lateral plate    CHOLECYSTECTOMY  1601   CO2 LASER APPLICATION N/A 09/32/3557   Procedure: CO2 LASER APPLICATION;  Surgeon: Gus Height, MD;  Location: [facility];  Service: Gynecology;  Laterality: N/A;   COLONOSCOPY W/ POLYPECTOMY     LEG SURGERY  1983   dicron artery   NECK SURGERY  2011   fusion   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     Family History  Problem Relation Age of Onset   Diabetes Other    Heart disease Other    COPD Mother    Diabetes Father    Pneumonia Father    Alcoholism Father    Colon cancer Neg Hx     Social History   Tobacco Use   Smoking status: Former Smoker    Packs/day: 1.00    Years: 20.00  Pack years: 20.00    Types: Cigarettes    Quit date: 09/07/2002    Years since quitting: 17.7   Smokeless tobacco: Never Used  Vaping Use   Vaping Use: Never used  Substance Use Topics   Alcohol use: No    Comment: occ   Drug use: No    Home Medications Prior to Admission medications   Medication Sig Start Date End Date Taking? Authorizing Provider  Cimetidine (TAGAMET PO) Take 1 tablet by mouth daily as needed (indigestion).    [provider]  diphenhydrAMINE (BENADRYL) 25 MG tablet Take 25 mg by mouth at bedtime.    [provider]  estrogens-methylTEST 1.25-2.5 MG TABS tablet Take 1 tablet by mouth daily.    [provider]  HYDROcodone-acetaminophen (NORCO/VICODIN) 5-325 MG tablet Take 1-2 tablets by mouth every 6 (six) hours as needed. 05/29/20   Nuala Alpha A, PA-C  linezolid (ZYVOX) 600 MG tablet Take 1 tablet (600 mg total) by mouth 2 (two) times daily for 7 days. 05/29/20 06/05/20  Nuala Alpha A, PA-C  medroxyPROGESTERone (PROVERA) 2.5 MG tablet Take 2.5 mg by mouth daily.    [provider]  meloxicam (MOBIC) 15 MG tablet Take 15 mg by mouth daily.    [provider]  temazepam (RESTORIL) 15 MG capsule Take 15 mg by mouth at bedtime as needed for sleep.    [provider]    Allergies    Patient has no known allergies.  Review of Systems   Review of Systems Ten systems are reviewed and are negative for acute change except as noted in the HPI  Physical Exam Updated Vital Signs BP (!) 145/97 (BP Location: Right Arm)    Pulse (!) 101    Temp 98 F (36.7 C) (Oral)    Resp 18    Ht 5\' 2"  (1.575 m)    Wt 94.8 kg    SpO2 100%    BMI 38.23 kg/m   Physical Exam Constitutional:      General: She is not in acute distress.    Appearance: Normal appearance. She is well-developed. She is not ill-appearing or diaphoretic.  HENT:     Head: Normocephalic and atraumatic.  Eyes:     General: Vision grossly intact. Gaze aligned appropriately.     Pupils: Pupils are equal, round, and reactive to light.  Neck:     Trachea: Trachea and phonation normal.  Pulmonary:     Effort: Pulmonary effort is normal. No respiratory distress.  Abdominal:     General: There is no distension.     Palpations: Abdomen is soft.     Tenderness: There is no abdominal tenderness. There is no guarding or rebound.  Musculoskeletal:        General: Normal range of motion.     Cervical back: Normal range of motion.  Skin:    General: Skin is warm and dry.          Comments: 8 cm diameter wound present to the right anterior lower leg.  V shaped deeper wound consistent with animal scratch some  purulent material and scabbing present.  No significant streaking.  No fluctuance or induration.  Neurovascular intact distally, good capillary refill and sensation strong equal pedal pulses.  Good range of motion at the knee and ankle without pain.  Compartments soft.  Neurological:     Mental Status: She is alert.     GCS: GCS eye subscore is 4. GCS verbal subscore is  5. GCS motor subscore is 6.     Comments: Speech is clear and goal oriented, follows commands Major Cranial nerves without deficit, no facial droop Moves extremities without ataxia, coordination intact  Psychiatric:        Behavior: Behavior normal.       ED Results / Procedures / Treatments   Labs (all labs ordered are listed, but only abnormal results are displayed) Labs Reviewed  CBC WITH DIFFERENTIAL/PLATELET - Abnormal; Notable for the following components:      Result Value   MCV 100.7 (*)    All other components within normal limits  BASIC METABOLIC PANEL - Abnormal; Notable for the following components:   BUN 23 (*)    All other components within normal limits    EKG None  Radiology No results found.  Procedures Procedures (including critical care time)  Medications Ordered in ED Medications - No data to display  ED Course  I have reviewed the triage vital signs and the nursing notes.  Pertinent labs & imaging results that were available during my care of the patient were reviewed by me and considered in my medical decision making (see chart for details).  Clinical Course as of May 29 2010  Wed May 29, 2020  1728 ssri   [BM]  1728 linezolid   [BM]  1728 600 mg BID   [BM]  1729 Check platets  Dr. Collene Mares   [BM]    Clinical Course User Index [BM] Gari Crown   MDM Rules/Calculators/A&P                          Additional history obtained from: 1. Nursing notes from this visit. 2. Electronic medical record. --------------------- 59 year old female history as above  presents for 6-week wound of the right lower leg after a dog scratch.  Has attempted Augmentin and doxycycline.  She is overall well-appearing no acute distress no history of fevers and no other concerns today.  She has good range of motion at the joints above and below neurovascular intact distally.  No evidence of septic arthritis, DVT, septic arthritis or other emergent pathologies. Reviewed case and imaging with Dr. Roderic Palau, question whether patient would be a dalbavancin candidate.  Plan of care is to consult infectious disease for medication recommendations. Patient does not appear septic or needing admission for IV abx. Additionally patient is strongly against admission today. - Reviewed case with infectious disease specialist Dr. Gabriel Cirri.  Dr. Gabriel Cirri recommends treating patient with linezolid 600 mg twice daily.  Advises to check platelet count and ensure patient is not on any SSRIs before prescribing. - I ordered, reviewed and interpreted labs which include: CBC without leukocytosis or anemia.  Platelet count within normal limits. BMP shows no emergent electrolyte derangement, AKI or gap.  Discussed case with pharmacist Trenton Gammon, for medication counseling regarding linezolid.  Advises to have patient stop taking tramadol and switch to Norco for duration of treatment.  Since patient will be treated for 7 days no indication for follow-up CBC. - Patient reassessed resting comfortably no acute distress she is agreeable for outpatient treatment with linezolid.  She is to follow-up with her PCP this week and to follow-up with wound care for further treatment.  Patient advised to return to the ER immediately for any new or worsening symptoms.  At this time there does not appear to be any evidence of an acute emergency medical condition and the patient appears stable for discharge  with appropriate outpatient follow up. Diagnosis was discussed with patient who verbalizes understanding of care plan and is  agreeable to discharge. I have discussed return precautions with patient who verbalizes understanding. Patient encouraged to follow-up with their PCP. All questions answered.  Patient's case discussed with Dr. Roderic Palau who agrees with plan to discharge with Linezolid and outpatient follow-up.  ---------------- Addendum: After informing patient that she will need to start taking tramadol to avoid adverse reaction and she requested pain medication to help with her chronic pain.  I discussed again with pharmacist Trenton Gammon, advised that hydrocodone is in fact a safe option with linezolid, this will be low-dose and patient has tolerated well in the past.  Patient prescribed 6 pills Norco, 1 pill every 6 hours for severe pain.  Patient advised to follow-up with her PCP for additional pain medication as needed.  She is strongly advised against taking any tramadol and she states understanding.  Note: Portions of this report may have been transcribed using voice recognition software. Every effort was made to ensure accuracy; however, inadvertent computerized transcription errors may still be present. Final Clinical Impression(s) / ED Diagnoses Final diagnoses:  Visit for wound check  Cellulitis of right lower extremity    Rx / DC Orders ED Discharge Orders         Ordered    linezolid (ZYVOX) 600 MG tablet  2 times daily,   Status:  Discontinued        05/29/20 1930    HYDROcodone-acetaminophen (NORCO/VICODIN) 5-325 MG tablet  Every 6 hours PRN        05/29/20 1946    linezolid (ZYVOX) 600 MG tablet  2 times daily        05/29/20 1955           Gari Crown 05/29/20 1937    Gari Crown 05/29/20 2011    Milton Ferguson, MD 05/30/20 1023

## 2020-06-10 ENCOUNTER — Encounter (HOSPITAL_BASED_OUTPATIENT_CLINIC_OR_DEPARTMENT_OTHER): Payer: PRIVATE HEALTH INSURANCE | Attending: Internal Medicine | Admitting: Internal Medicine

## 2020-06-10 DIAGNOSIS — S81811S Laceration without foreign body, right lower leg, sequela: Secondary | ICD-10-CM | POA: Insufficient documentation

## 2020-06-10 DIAGNOSIS — X58XXXS Exposure to other specified factors, sequela: Secondary | ICD-10-CM | POA: Diagnosis not present

## 2020-06-10 DIAGNOSIS — L97818 Non-pressure chronic ulcer of other part of right lower leg with other specified severity: Secondary | ICD-10-CM | POA: Insufficient documentation

## 2020-06-10 DIAGNOSIS — I87321 Chronic venous hypertension (idiopathic) with inflammation of right lower extremity: Secondary | ICD-10-CM | POA: Diagnosis not present

## 2020-06-10 NOTE — Progress Notes (Signed)
[name], [name] ([id number]) Visit Report for 06/10/2020 Abuse/Suicide Risk Screen Details Patient Name: Date of Service: [name] 06/10/2020 2:45 PM Medical Record Number: [id number] Patient Account Number: [id number] Date of Birth/Sex: Treating RN: 02-Jun-1961 (59 y.o. Orvan Falconer Primary Care Nehemiah Mcfarren: [employer] Other Clinician: Referring Rajvi Armentor: Treating Dustin Bumbaugh/Extender: Garfield Cornea in Treatment: 0 Abuse/Suicide Risk Screen Items Answer ABUSE RISK SCREEN: Has anyone close to you tried to hurt or harm you recentlyo No Do you feel uncomfortable with anyone in your familyo No Has anyone forced you do things that you didnt want to doo No Electronic Signature(s) Signed: 06/10/2020 5:20:03 PM By: Carlene Coria RN Entered By: Carlene Coria on 06/10/2020 15:09:18 -------------------------------------------------------------------------------- Activities of Daily Living Details Patient Name: Date of Service: [name] 06/10/2020 2:45 PM Medical Record Number: [id number] Patient Account Number: [id number] Date of Birth/Sex: Treating RN: Jun 26, 1961 (59 y.o. Orvan Falconer Primary Care Jenella Craigie: [employer] Other Clinician: Referring Meera Vasco: Treating Alyssa Rotondo/Extender: Garfield Cornea in Treatment: 0 Activities of Daily Living Items Answer Activities of Daily Living (Please select one for each item) Drive Automobile Completely Able T Medications ake Completely Able Use T elephone Completely Able Care for Appearance Completely Able Use T oilet Completely Able Bath / Shower Completely Able Dress Self Completely Able Feed Self Completely Able Walk Completely Able Get In / Out Bed Completely Able Housework Completely Able Prepare Meals Completely [place] for Self Completely Able Electronic Signature(s) Signed: 06/10/2020 5:20:03 PM By: Carlene Coria RN Entered By:  Carlene Coria on 06/10/2020 15:09:43 -------------------------------------------------------------------------------- Education Screening Details Patient Name: Date of Service: Rachel Gains, [name] 06/10/2020 2:45 PM Medical Record Number: [id number] Patient Account Number: [id number] Date of Birth/Sex: Treating RN: 25-Jul-1961 (59 y.o. Orvan Falconer Primary Care Zettie Gootee: [employer] Other Clinician: Referring Elliana Bal: Treating Dryden Tapley/Extender: Garfield Cornea in Treatment: 0 Primary Learner Assessed: Patient Learning Preferences/Education Level/Primary Language Learning Preference: Explanation Highest Education Level: College or Above Preferred Language: English Cognitive Barrier Language Barrier: No Translator Needed: No Memory Deficit: No Emotional Barrier: No Cultural/Religious Beliefs Affecting Medical Care: No Physical Barrier Impaired Vision: No Impaired Hearing: No Decreased Hand dexterity: No Knowledge/Comprehension Knowledge Level: Medium Comprehension Level: High Ability to understand written instructions: High Ability to understand verbal instructions: High Motivation Anxiety Level: Anxious Cooperation: Cooperative Education Importance: Acknowledges Need Interest in Health Problems: Asks Questions Perception: Coherent Willingness to Engage in Self-Management High Activities: Readiness to Engage in Self-Management High Activities: Electronic Signature(s) Signed: 06/10/2020 5:20:03 PM By: Carlene Coria RN Entered By: Carlene Coria on 06/10/2020 15:10:39 -------------------------------------------------------------------------------- Fall Risk Assessment Details Patient Name: Date of Service: Rachel Gains, [name] 06/10/2020 2:45 PM Medical Record Number: [id number] Patient Account Number: [id number] Date of Birth/Sex: Treating RN: 21-Mar-1961 (59 y.o. Orvan Falconer Primary Care Hildur Bayer: [employer] Other  Clinician: Referring Shikha Bibb: Treating Okechukwu Regnier/Extender: Garfield Cornea in Treatment: 0 Fall Risk Assessment Items Have you had 2 or more falls in the last 12 monthso 0 No Have you had any fall that resulted in injury in the last 12 monthso 0 No FALLS RISK SCREEN History of falling - immediate or within 3 months 0 No Secondary diagnosis (Do you have 2 or more medical diagnoseso) 0 No Ambulatory aid None/bed rest/wheelchair/nurse 0 No Crutches/cane/walker 0 No Furniture 0 No Intravenous therapy Access/Saline/Heparin Lock 0 No Gait/Transferring Normal/ bed rest/ wheelchair 0 No Weak (short steps with or  without shuffle, stooped but able to lift head while walking, may seek 0 No support from furniture) Impaired (short steps with shuffle, may have difficulty arising from chair, head down, impaired 0 No balance) Mental Status Oriented to own ability 0 No Electronic Signature(s) Signed: 06/10/2020 5:20:03 PM By: Carlene Coria RN Entered By: Carlene Coria on 06/10/2020 15:10:46 -------------------------------------------------------------------------------- Foot Assessment Details Patient Name: Date of Service: Rachel Gains, [name] 06/10/2020 2:45 PM Medical Record Number: [id number] Patient Account Number: [id number] Date of Birth/Sex: Treating RN: 10-19-1960 (59 y.o. Orvan Falconer Primary Care Sheril Hammond: [employer] Other Clinician: Referring Shagun Wordell: Treating Larnie Heart/Extender: Garfield Cornea in Treatment: 0 Foot Assessment Items Site Locations + = Sensation present, - = Sensation absent, C = Callus, U = Ulcer R = Redness, W = Warmth, M = Maceration, PU = Pre-ulcerative lesion F = Fissure, S = Swelling, D = Dryness Assessment Right: Left: Other Deformity: No No Prior Foot Ulcer: No No Prior Amputation: No No Charcot Joint: No No Ambulatory Status: Gait: Electronic Signature(s) Signed: 06/10/2020 5:20:03 PM By: Carlene Coria RN Entered By: Carlene Coria on 06/10/2020 15:21:25 -------------------------------------------------------------------------------- Nutrition Risk Screening Details Patient Name: Date of Service: [name] 06/10/2020 2:45 PM Medical Record Number: [id number] Patient Account Number: [id number] Date of Birth/Sex: Treating RN: 01/07/1961 (59 y.o. Orvan Falconer Primary Care Meleny Tregoning: [employer] Other Clinician: Referring Camron Monday: Treating Joliana Claflin/Extender: Garfield Cornea in Treatment: 0 Height (in): 62 Weight (lbs): 206 Body Mass Index (BMI): 37.7 Nutrition Risk Screening Items Score Screening NUTRITION RISK SCREEN: I have an illness or condition that made me change the kind and/or amount of food I eat 0 No I eat fewer than two meals per day 0 No I eat few fruits and vegetables, or milk products 0 No I have three or more drinks of beer, liquor or wine almost every day 0 No I have tooth or mouth problems that make it hard for me to eat 0 No I don't always have enough money to buy the food I need 0 No I eat alone most of the time 0 No I take three or more different prescribed or over-the-counter drugs a day 1 Yes Without wanting to, I have lost or gained 10 pounds in the last six months 0 No I am not always physically able to shop, cook and/or feed myself 0 No Nutrition Protocols Good Risk Protocol 0 No interventions needed Moderate Risk Protocol High Risk Proctocol Risk Level: Good Risk Score: 1 Electronic Signature(s) Signed: 06/10/2020 5:20:03 PM By: Carlene Coria RN Entered By: Carlene Coria on 06/10/2020 15:10:58

## 2020-06-10 NOTE — Progress Notes (Signed)
ABEGAIL, KLOEPPEL ([id number]) Visit Report for 06/10/2020 Chief Complaint Document Details Patient Name: Date of Service: [name], [name] 06/10/2020 2:45 PM Medical Record Number: [id number] Patient Account Number: [id number] Date of Birth/Sex: Treating RN: 1961-04-05 (59 y.o. Elam Dutch Primary Care Provider: [employer] Other Clinician: Referring Provider: Treating Provider/Extender: Garfield Cornea in Treatment: 0 Information Obtained from: Patient Chief Complaint 06/10/2020; patient is here for review of a wound on her right anterior lower leg Electronic Signature(s) Signed: 06/10/2020 4:54:01 PM By: Linton Ham MD Entered By: Linton Ham on 06/10/2020 16:24:06 -------------------------------------------------------------------------------- HPI Details Patient Name: Date of Service: [name], Rachel Roys D. 06/10/2020 2:45 PM Medical Record Number: [id number] Patient Account Number: [id number] Date of Birth/Sex: Treating RN: [birth year] (59 y.o. Elam Dutch Primary Care Provider: [employer] Other Clinician: Referring Provider: Treating Provider/Extender: Garfield Cornea in Treatment: 0 History of Present Illness HPI Description: ADMISSION 06/10/2020 This is a patient who suffered a dog scratch injury I believe sometime in August. She was seen in an urgent care on 05/09/2020 she was given doxycycline for secondary cellulitis noting that she had already been on a course of Augmentin. She was told to treat the wound with topical antibiotics. She followed up in urgent care at which time the wound was very pruritic. She was given a course of linezolid. Subsequently the patient got a course itraconazole orally from her primary doctor for rash around the wound and the pruritus. This seems to have helped remarkably according to the patient. In any case she comes here using Medihoney with a adhesive dressing. It turns  out that she was seen at the [facility] clinic for 6 visits or so in August and September 2019 at that point she had an area on her posterior calf on the right. She comes in with a sizable list of demands including no mechanical debridement. She initially said that she was not sure she really needed to be here however I had a quick look at the wound and assured her that she did need to be here. Past medical history; lumbar radiculopathy, fifth metacarpal fracture, cellulitis of the left lower extremity ABI in this clinic was 1.06 on the right Electronic Signature(s) Signed: 06/10/2020 4:54:01 PM By: Linton Ham MD Entered By: Linton Ham on 06/10/2020 16:27:53 -------------------------------------------------------------------------------- Physical Exam Details Patient Name: Date of Service: [name], Rachel Roys D. 06/10/2020 2:45 PM Medical Record Number: [id number] Patient Account Number: [id number] Date of Birth/Sex: Treating RN: 11/12/1960 (59 y.o. Elam Dutch Primary Care Provider: [employer] Other Clinician: Referring Provider: Treating Provider/Extender: Garfield Cornea in Treatment: 0 Constitutional Sitting or standing Blood Pressure is within target range for patient.. Pulse regular and within target range for patient.Marland Kitchen Respirations regular, non-labored and within target range.. Temperature is normal and within the target range for the patient.Marland Kitchen Appears in no distress. Cardiovascular Pulses are palpable on the right. She has some degree of chronic venous insufficiency nonpitting edema in the right lower leg. Integumentary (Hair, Skin) Some degree of erythema in the anterior right leg although I think this is stasis dermatitis rather than anything else. There are no signs of a fungal issue currently.Marland Kitchen Psychiatric appears at normal baseline. Electronic Signature(s) Signed: 06/10/2020 4:54:01 PM By: Linton Ham MD Entered By: Linton Ham  on 06/10/2020 16:29:37 -------------------------------------------------------------------------------- Physician Orders Details Patient Name: Date of Service: Estelle June D. 06/10/2020 2:45 PM Medical Record Number: [id number] Patient Account Number: [id number] Date  of Birth/Sex: Treating RN: 11-13-1960 (59 y.o. Elam Dutch Primary Care Provider: [employer] Other Clinician: Referring Provider: Treating Provider/Extender: Garfield Cornea in Treatment: 0 Verbal / Phone Orders: No Diagnosis Coding Follow-up Appointments Return Appointment in 2 weeks. Dressing Change Frequency Wound #1 Right Lower Leg Change dressing every day. Wound Cleansing Wound #1 Right Lower Leg May shower and wash wound with soap and water. - scrub gently with gauze or clean wash cloth at the end of your shower Primary Wound Dressing Wound #1 Right Lower Leg Medihoney gel Secondary Dressing Wound #1 Right Lower Leg Kerlix/Rolled Gauze Dry Gauze Other: - may use ace wrap or coban to secure Edema Control Avoid standing for long periods of time Elevate legs to the level of the heart or above for 30 minutes daily and/or when sitting, a frequency of: Exercise regularly Electronic Signature(s) Signed: 06/10/2020 4:54:01 PM By: Linton Ham MD Signed: 06/10/2020 5:03:51 PM By: Baruch Gouty RN, BSN Entered By: Baruch Gouty on 06/10/2020 15:57:20 -------------------------------------------------------------------------------- Problem List Details Patient Name: Date of Service: Estelle June D. 06/10/2020 2:45 PM Medical Record Number: [id number] Patient Account Number: [id number] Date of Birth/Sex: Treating RN: 1961-03-29 (59 y.o. Elam Dutch Primary Care Provider: [employer] Other Clinician: Referring Provider: Treating Provider/Extender: Garfield Cornea in Treatment: 0 Active Problems ICD-10 Encounter Code Description  Active Date MDM Diagnosis S81.811S Laceration without foreign body, right lower leg, sequela 06/10/2020 No Yes L97.818 Non-pressure chronic ulcer of other part of right lower leg with other specified 06/10/2020 No Yes severity I87.321 Chronic venous hypertension (idiopathic) with inflammation of right lower 06/10/2020 No Yes extremity Inactive Problems Resolved Problems Electronic Signature(s) Signed: 06/10/2020 4:54:01 PM By: Linton Ham MD Entered By: Linton Ham on 06/10/2020 16:23:33 -------------------------------------------------------------------------------- Progress Note Details Patient Name: Date of Service: [name], Rachel Roys D. 06/10/2020 2:45 PM Medical Record Number: [id number] Patient Account Number: [id number] Date of Birth/Sex: Treating RN: 02-15-1961 (59 y.o. Elam Dutch Primary Care Provider: [employer] Other Clinician: Referring Provider: Treating Provider/Extender: Garfield Cornea in Treatment: 0 Subjective Chief Complaint Information obtained from Patient 06/10/2020; patient is here for review of a wound on her right anterior lower leg History of Present Illness (HPI) ADMISSION 06/10/2020 This is a patient who suffered a dog scratch injury I believe sometime in August. She was seen in an urgent care on 05/09/2020 she was given doxycycline for secondary cellulitis noting that she had already been on a course of Augmentin. She was told to treat the wound with topical antibiotics. She followed up in urgent care at which time the wound was very pruritic. She was given a course of linezolid. Subsequently the patient got a course itraconazole orally from her primary doctor for rash around the wound and the pruritus. This seems to have helped remarkably according to the patient. In any case she comes here using Medihoney with a adhesive dressing. It turns out that she was seen at the [facility] clinic for 6 visits or so in August and  September 2019 at that point she had an area on her posterior calf on the right. She comes in with a sizable list of demands including no mechanical debridement. She initially said that she was not sure she really needed to be here however I had a quick look at the wound and assured her that she did need to be here. Past medical history; lumbar radiculopathy, fifth metacarpal fracture, cellulitis of the left lower extremity  ABI in this clinic was 1.06 on the right Patient History Information obtained from Patient. Allergies cimetidine Family History Diabetes - Father, Hypertension - Mother, No family history of Cancer, Heart Disease, Hereditary Spherocytosis, Kidney Disease, Lung Disease, Seizures, Stroke, Thyroid Problems, Tuberculosis. Social History Former smoker, Marital Status - Married, Alcohol Use - Rarely, Drug Use - No History, Caffeine Use - Daily. Medical History Eyes Denies history of Cataracts, Glaucoma, Optic Neuritis Ear/Nose/Mouth/Throat Denies history of Chronic sinus problems/congestion, Middle ear problems Hematologic/Lymphatic Denies history of Anemia, Hemophilia, Human Immunodeficiency Virus, Lymphedema, Sickle Cell Disease Respiratory Denies history of Aspiration, Asthma, Chronic Obstructive Pulmonary Disease (COPD), Pneumothorax, Sleep Apnea, Tuberculosis Cardiovascular Denies history of Angina, Arrhythmia, Congestive Heart Failure, Coronary Artery Disease, Deep Vein Thrombosis, Hypertension, Hypotension, Myocardial Infarction, Peripheral Arterial Disease, Peripheral Venous Disease, Phlebitis, Vasculitis Gastrointestinal Denies history of Cirrhosis , Colitis, Crohnoos, Hepatitis A, Hepatitis B, Hepatitis C Endocrine Denies history of Type I Diabetes, Type II Diabetes Genitourinary Denies history of End Stage Renal Disease Immunological Denies history of Lupus Erythematosus, Raynaudoos, Scleroderma Integumentary (Skin) Denies history of History of  Burn Musculoskeletal Denies history of Gout, Rheumatoid Arthritis, Osteoarthritis, Osteomyelitis Neurologic Denies history of Dementia, Neuropathy, Quadriplegia, Paraplegia, Seizure Disorder Oncologic Denies history of Received Chemotherapy, Received Radiation Psychiatric Denies history of Anorexia/bulimia, Confinement Anxiety Review of Systems (ROS) Constitutional Symptoms (General Health) Denies complaints or symptoms of Fatigue, Fever, Chills, Marked Weight Change. Eyes Denies complaints or symptoms of Dry Eyes, Vision Changes, Glasses / Contacts. Ear/Nose/Mouth/Throat Denies complaints or symptoms of Chronic sinus problems or rhinitis. Respiratory Denies complaints or symptoms of Chronic or frequent coughs, Shortness of Breath. Cardiovascular Denies complaints or symptoms of Chest pain. Gastrointestinal Denies complaints or symptoms of Frequent diarrhea, Nausea, Vomiting. Endocrine Denies complaints or symptoms of Heat/cold intolerance. Genitourinary Denies complaints or symptoms of Frequent urination. Integumentary (Skin) Complains or has symptoms of Wounds. Musculoskeletal Denies complaints or symptoms of Muscle Pain, Muscle Weakness. Neurologic Denies complaints or symptoms of Numbness/parasthesias. Psychiatric Denies complaints or symptoms of Claustrophobia, Suicidal. Objective Constitutional Sitting or standing Blood Pressure is within target range for patient.. Pulse regular and within target range for patient.Marland Kitchen Respirations regular, non-labored and within target range.. Temperature is normal and within the target range for the patient.Marland Kitchen Appears in no distress. Vitals Time Taken: 3:03 PM, Height: 62 in, Source: Stated, Weight: 206 lbs, Source: Stated, BMI: 37.7, Temperature: 98 F, Pulse: 81 bpm, Respiratory Rate: 18 breaths/min, Blood Pressure: 113/73 mmHg. Cardiovascular Pulses are palpable on the right. She has some degree of chronic venous insufficiency  nonpitting edema in the right lower leg. Psychiatric appears at normal baseline. Integumentary (Hair, Skin) Some degree of erythema in the anterior right leg although I think this is stasis dermatitis rather than anything else. There are no signs of a fungal issue currently.. Wound #1 status is Open. Original cause of wound was Trauma. The wound is located on the Right Lower Leg. The wound measures 4cm length x 4.5cm width x 0.1cm depth; 14.137cm^2 area and 1.414cm^3 volume. There is Fat Layer (Subcutaneous Tissue) exposed. There is no tunneling or undermining noted. There is a medium amount of serosanguineous drainage noted. There is medium (34-66%) red granulation within the wound bed. There is a medium (34-66%) amount of necrotic tissue within the wound bed including Adherent Slough. Assessment Active Problems ICD-10 Laceration without foreign body, right lower leg, sequela Non-pressure chronic ulcer of other part of right lower leg with other specified severity Chronic venous hypertension (idiopathic) with inflammation of right lower extremity  Plan Follow-up Appointments: Return Appointment in 2 weeks. Dressing Change Frequency: Wound #1 Right Lower Leg: Change dressing every day. Wound Cleansing: Wound #1 Right Lower Leg: May shower and wash wound with soap and water. - scrub gently with gauze or clean wash cloth at the end of your shower Primary Wound Dressing: Wound #1 Right Lower Leg: Medihoney gel Secondary Dressing: Wound #1 Right Lower Leg: Kerlix/Rolled Gauze Dry Gauze Other: - may use ace wrap or coban to secure Edema Control: Avoid standing for long periods of time Elevate legs to the level of the heart or above for 30 minutes daily and/or when sitting, a frequency of: Exercise regularly 1. I see no current evidence of infection in this wound either bacterial or fungal 2. The wound surface is nonviable and will require debridement if not mechanically then with  topical agents. Apparently the patient will not agree to debridement based on prior problems when she was in [place] in 2019 with regards to pain but also cost. 3. I think this patient probably has some degree of chronic venous insufficiency. Nevertheless I really could not debate her much on wrapping this she just simply would not hear about it. She has been using Medihoney on the wound surface with a topical cover. We will continue this for 2 weeks and see how this goes. I am doubtful that this will provide enough debridement. We were not sure of her insurance coverage for Santyl. Iodoflex would be another option. 4. She may need compression and will go over that issue next time as well. 5. I really was not exactly sure about the itraconazole which was apparently given to her for 60 days this has a lot of possible side effects. I am assuming her primary physician will monitor her for these Electronic Signature(s) Signed: 06/10/2020 4:54:01 PM By: Linton Ham MD Entered By: Linton Ham on 06/10/2020 16:31:55 -------------------------------------------------------------------------------- HxROS Details Patient Name: Date of Service: [name], Rachel Roys D. 06/10/2020 2:45 PM Medical Record Number: [id number] Patient Account Number: [id number] Date of Birth/Sex: Treating RN: [birth year] (59 y.o. Orvan Falconer Primary Care Provider: [employer] Other Clinician: Referring Provider: Treating Provider/Extender: Garfield Cornea in Treatment: 0 Information Obtained From Patient Constitutional Symptoms (General Health) Complaints and Symptoms: Negative for: Fatigue; Fever; Chills; Marked Weight Change Eyes Complaints and Symptoms: Negative for: Dry Eyes; Vision Changes; Glasses / Contacts Medical History: Negative for: Cataracts; Glaucoma; Optic Neuritis Ear/Nose/Mouth/Throat Complaints and Symptoms: Negative for: Chronic sinus problems or  rhinitis Medical History: Negative for: Chronic sinus problems/congestion; Middle ear problems Respiratory Complaints and Symptoms: Negative for: Chronic or frequent coughs; Shortness of Breath Medical History: Negative for: Aspiration; Asthma; Chronic Obstructive Pulmonary Disease (COPD); Pneumothorax; Sleep Apnea; Tuberculosis Cardiovascular Complaints and Symptoms: Negative for: Chest pain Medical History: Negative for: Angina; Arrhythmia; Congestive Heart Failure; Coronary Artery Disease; Deep Vein Thrombosis; Hypertension; Hypotension; Myocardial Infarction; Peripheral Arterial Disease; Peripheral Venous Disease; Phlebitis; Vasculitis Gastrointestinal Complaints and Symptoms: Negative for: Frequent diarrhea; Nausea; Vomiting Medical History: Negative for: Cirrhosis ; Colitis; Crohns; Hepatitis A; Hepatitis B; Hepatitis C Endocrine Complaints and Symptoms: Negative for: Heat/cold intolerance Medical History: Negative for: Type I Diabetes; Type II Diabetes Genitourinary Complaints and Symptoms: Negative for: Frequent urination Medical History: Negative for: End Stage Renal Disease Integumentary (Skin) Complaints and Symptoms: Positive for: Wounds Medical History: Negative for: History of Burn Musculoskeletal Complaints and Symptoms: Negative for: Muscle Pain; Muscle Weakness Medical History: Negative for: Gout; Rheumatoid Arthritis; Osteoarthritis; Osteomyelitis Neurologic Complaints and Symptoms: Negative for:  Numbness/parasthesias Medical History: Negative for: Dementia; Neuropathy; Quadriplegia; Paraplegia; Seizure Disorder Psychiatric Complaints and Symptoms: Negative for: Claustrophobia; Suicidal Medical History: Negative for: Anorexia/bulimia; Confinement Anxiety Hematologic/Lymphatic Medical History: Negative for: Anemia; Hemophilia; Human Immunodeficiency Virus; Lymphedema; Sickle Cell Disease Immunological Medical History: Negative for: Lupus  Erythematosus; Raynauds; Scleroderma Oncologic Medical History: Negative for: Received Chemotherapy; Received Radiation Immunizations Pneumococcal Vaccine: Received Pneumococcal Vaccination: No Implantable Devices None Family and Social History Cancer: No; Diabetes: Yes - Father; Heart Disease: No; Hereditary Spherocytosis: No; Hypertension: Yes - Mother; Kidney Disease: No; Lung Disease: No; Seizures: No; Stroke: No; Thyroid Problems: No; Tuberculosis: No; Former smoker; Marital Status - Married; Alcohol Use: Rarely; Drug Use: No History; Caffeine Use: Daily; Financial Concerns: No; Food, Clothing or Shelter Needs: No; Support System Lacking: No; Transportation Concerns: No Electronic Signature(s) Signed: 06/10/2020 4:54:01 PM By: Linton Ham MD Signed: 06/10/2020 5:20:03 PM By: Carlene Coria RN Entered By: Carlene Coria on 06/10/2020 15:09:01 -------------------------------------------------------------------------------- SuperBill Details Patient Name: Date of Service: [name], Rachel Roys D. 06/10/2020 Medical Record Number: [id number] Patient Account Number: [id number] Date of Birth/Sex: Treating RN: [birth year] (59 y.o. Martyn Malay, Linda Primary Care Provider: [employer] Other Clinician: Referring Provider: Treating Provider/Extender: Garfield Cornea in Treatment: 0 Diagnosis Coding ICD-10 Codes Code Description [phone] Laceration without foreign body, right lower leg, sequela L97.818 Non-pressure chronic ulcer of other part of right lower leg with other specified severity I87.321 Chronic venous hypertension (idiopathic) with inflammation of right lower extremity I89.0 Lymphedema, not elsewhere classified Facility Procedures CPT4 Code: [id number] Description: 99214 - WOUND CARE VISIT-LEV 4 EST PT Modifier: Quantity: 1 Physician Procedures : CPT4 Code Description Modifier [id number] [place] PHYS LEVEL 3 NEW PT ICD-10 Diagnosis Description S81.811S  Laceration without foreign body, right lower leg, sequela L97.818 Non-pressure chronic ulcer of other part of right lower leg with other specified  severity I87.321 Chronic venous hypertension (idiopathic) with inflammation of right lower extremity Quantity: 1 Electronic Signature(s) Signed: 06/10/2020 4:54:01 PM By: Linton Ham MD Entered By: Linton Ham on 06/10/2020 16:34:13

## 2020-06-10 NOTE — Progress Notes (Signed)
ADELIS, DOCTER ([id number]) Visit Report for 06/10/2020 Allergy List Details Patient Name: Date of Service: [name], [name] 06/10/2020 2:45 PM Medical Record Number: [id number] Patient Account Number: [id number] Date of Birth/Sex: Treating RN: 07-07-1961 (59 y.o. Orvan Falconer Primary Care Reyan Helle: [employer] Other Clinician: Referring Jalan Fariss: Treating Natallia Stellmach/Extender: Garfield Cornea in Treatment: 0 Allergies Active Allergies cimetidine Allergy Notes Electronic Signature(s) Signed: 06/10/2020 5:20:03 PM By: Carlene Coria RN Entered By: Carlene Coria on 06/10/2020 15:04:20 -------------------------------------------------------------------------------- Arrival Information Details Patient Name: Date of Service: Rachel June D. 06/10/2020 2:45 PM Medical Record Number: [id number] Patient Account Number: [id number] Date of Birth/Sex: Treating RN: 1961/02/03 (59 y.o. Orvan Falconer Primary Care Arrabella Westerman: [employer] Other Clinician: Referring Rudell Ortman: Treating Ashleigh Arya/Extender: Garfield Cornea in Treatment: 0 Visit Information Patient Arrived: Ambulatory Arrival Time: 14:58 Accompanied By: self Transfer Assistance: None Patient Identification Verified: Yes Secondary Verification Process Completed: Yes Patient Requires Transmission-Based Precautions: No Patient Has Alerts: No Electronic Signature(s) Signed: 06/10/2020 5:20:03 PM By: Carlene Coria RN Entered By: Carlene Coria on 06/10/2020 15:03:05 -------------------------------------------------------------------------------- Clinic Level of Care Assessment Details Patient Name: Date of Service: [name], [name] 06/10/2020 2:45 PM Medical Record Number: [id number] Patient Account Number: [id number] Date of Birth/Sex: Treating RN: 01-Feb-1961 (59 y.o. Elam Dutch Primary Care Iana Buzan: [employer] Other Clinician: Referring Anise Harbin: Treating  Fredda Clarida/Extender: Garfield Cornea in Treatment: 0 Clinic Level of Care Assessment Items TOOL 2 Quantity Score []  - 0 Use when only an EandM is performed on the INITIAL visit ASSESSMENTS - Nursing Assessment / Reassessment X- 1 20 General Physical Exam (combine w/ comprehensive assessment (listed just below) when performed on new pt. evals) X- 1 25 Comprehensive Assessment (HX, ROS, Risk Assessments, Wounds Hx, etc.) ASSESSMENTS - Wound and Skin A ssessment / Reassessment X - Simple Wound Assessment / Reassessment - one wound 1 5 []  - 0 Complex Wound Assessment / Reassessment - multiple wounds []  - 0 Dermatologic / Skin Assessment (not related to wound area) ASSESSMENTS - Ostomy and/or Continence Assessment and Care []  - 0 Incontinence Assessment and Management []  - 0 Ostomy Care Assessment and Management (repouching, etc.) PROCESS - Coordination of Care X - Simple Patient / Family Education for ongoing care 1 15 []  - 0 Complex (extensive) Patient / Family Education for ongoing care X- 1 10 Staff obtains Consents, Records, T Results / Process Orders est []  - 0 Staff telephones HHA, Nursing Homes / Clarify orders / etc []  - 0 Routine Transfer to another Facility (non-emergent condition) []  - 0 Routine Hospital Admission (non-emergent condition) X- 1 15 New Admissions / Biomedical engineer / Ordering NPWT Apligraf, etc. , []  - 0 Emergency Hospital Admission (emergent condition) X- 1 10 Simple Discharge Coordination []  - 0 Complex (extensive) Discharge Coordination PROCESS - Special Needs []  - 0 Pediatric / Minor Patient Management []  - 0 Isolation Patient Management []  - 0 Hearing / Language / Visual special needs []  - 0 Assessment of Community assistance (transportation, D/C planning, etc.) []  - 0 Additional assistance / Altered mentation []  - 0 Support Surface(s) Assessment (bed, cushion, seat, etc.) INTERVENTIONS - Wound Cleansing  / Measurement X- 1 5 Wound Imaging (photographs - any number of wounds) []  - 0 Wound Tracing (instead of photographs) X- 1 5 Simple Wound Measurement - one wound []  - 0 Complex Wound Measurement - multiple wounds X- 1 5 Simple Wound Cleansing - one wound []  - 0 Complex  Wound Cleansing - multiple wounds INTERVENTIONS - Wound Dressings X - Small Wound Dressing one or multiple wounds 1 10 []  - 0 Medium Wound Dressing one or multiple wounds []  - 0 Large Wound Dressing one or multiple wounds []  - 0 Application of Medications - injection INTERVENTIONS - Miscellaneous []  - 0 External ear exam []  - 0 Specimen Collection (cultures, biopsies, blood, body fluids, etc.) []  - 0 Specimen(s) / Culture(s) sent or taken to Lab for analysis []  - 0 Patient Transfer (multiple staff / Harrel Lemon Lift / Similar devices) []  - 0 Simple Staple / Suture removal (25 or less) []  - 0 Complex Staple / Suture removal (26 or more) []  - 0 Hypo / Hyperglycemic Management (close monitor of Blood Glucose) X- 1 15 Ankle / Brachial Index (ABI) - do not check if billed separately Has the patient been seen at the hospital within the last three years: Yes Total Score: 140 Level Of Care: New/Established - Level 4 Electronic Signature(s) Signed: 06/10/2020 5:03:51 PM By: Baruch Gouty RN, BSN Entered By: Baruch Gouty on 06/10/2020 15:50:38 -------------------------------------------------------------------------------- Encounter Discharge Information Details Patient Name: Date of Service: [name], Rachel Roys D. 06/10/2020 2:45 PM Medical Record Number: [id number] Patient Account Number: [id number] Date of Birth/Sex: Treating RN: Jan 09, 1961 (59 y.o. Clearnce Sorrel Primary Care Alyss Granato: [employer] Other Clinician: Referring Maneh Sieben: Treating Ercell Razon/Extender: Garfield Cornea in Treatment: 0 Encounter Discharge Information Items Discharge Condition: Stable Ambulatory Status:  Ambulatory Discharge Destination: Home Transportation: Private Auto Accompanied By: self Schedule Follow-up Appointment: Yes Clinical Summary of Care: Patient Declined Electronic Signature(s) Signed: 06/10/2020 5:08:20 PM By: Kela Millin Entered By: Kela Millin on 06/10/2020 16:04:02 -------------------------------------------------------------------------------- Lower Extremity Assessment Details Patient Name: Date of Service: Rachel June D. 06/10/2020 2:45 PM Medical Record Number: [id number] Patient Account Number: [id number] Date of Birth/Sex: Treating RN: [birth year] (59 y.o. Orvan Falconer Primary Care Willow Shidler: [employer] Other Clinician: Referring Webb Weed: Treating Aradhya Shellenbarger/Extender: Garfield Cornea in Treatment: 0 Edema Assessment Assessed: Shirlyn Goltz: No] [Right: No] Edema: [Left: Ye] [Right: s] Calf Left: Right: Point of Measurement: 39 cm From Medial Instep 38 cm Ankle Left: Right: Point of Measurement: 10 cm From Medial Instep 25 cm Vascular Assessment Blood Pressure: Brachial: [Right:113] Ankle: [Right:Dorsalis Pedis: 120 1.06] Electronic Signature(s) Signed: 06/10/2020 5:20:03 PM By: Carlene Coria RN Entered By: Carlene Coria on 06/10/2020 15:23:08 -------------------------------------------------------------------------------- Multi Wound Chart Details Patient Name: Date of Service: [name], Rachel Roys D. 06/10/2020 2:45 PM Medical Record Number: [id number] Patient Account Number: [id number] Date of Birth/Sex: Treating RN: 1961-01-12 (59 y.o. Elam Dutch Primary Care Braedyn Riggle: [employer] Other Clinician: Referring Sharlon Pfohl: Treating Kyrie Bun/Extender: Garfield Cornea in Treatment: 0 Vital Signs Height(in): 62 Pulse(bpm): 39 Weight(lbs): 28 Blood Pressure(mmHg): 113/73 Body Mass Index(BMI): 38 Temperature(F): 98 Respiratory Rate(breaths/min): 18 Photos: [1:No Photos Right  Lower Leg] [N/A:N/A N/A] Wound Location: [1:Trauma] [N/A:N/A] Wounding Event: [1:Inflammatory] [N/A:N/A] Primary Etiology: [1:04/07/2020] [N/A:N/A] Date Acquired: [1:0] [N/A:N/A] Weeks of Treatment: [1:Open] [N/A:N/A] Wound Status: [1:4x4.5x0.1] [N/A:N/A] Measurements L x W x D (cm) [1:14.137] [N/A:N/A] A (cm) : rea [1:1.414] [N/A:N/A] Volume (cm) : [1:Full Thickness Without Exposed] [N/A:N/A] Classification: [1:Support Structures Medium] [N/A:N/A] Exudate Amount: [1:Serosanguineous] [N/A:N/A] Exudate Type: [1:red, brown] [N/A:N/A] Exudate Color: [1:Medium (34-66%)] [N/A:N/A] Granulation Amount: [1:Red] [N/A:N/A] Granulation Quality: [1:Medium (34-66%)] [N/A:N/A] Necrotic Amount: [1:Fat Layer (Subcutaneous Tissue): Yes N/A] Exposed Structures: [1:Fascia: No Tendon: No Muscle: No Joint: No Bone: No None] [N/A:N/A] Treatment Notes Wound #1 (Right Lower Leg) 1.  Cleanse With Wound Cleanser 3. Primary Dressing Applied Other primary dressing (specifiy in notes) 4. Secondary Dressing Dry Gauze Roll Gauze 6. Support Layer Education officer, community) Signed: 06/10/2020 4:54:01 PM By: Linton Ham MD Signed: 06/10/2020 5:03:51 PM By: Baruch Gouty RN, BSN Entered By: Linton Ham on 06/10/2020 16:23:39 -------------------------------------------------------------------------------- Multi-Disciplinary Care Plan Details Patient Name: Date of Service: Rachel June D. 06/10/2020 2:45 PM Medical Record Number: [id number] Patient Account Number: [id number] Date of Birth/Sex: Treating RN: [birth year] (59 y.o. Elam Dutch Primary Care Zarielle Cea: [employer] Other Clinician: Referring Zakiah Beckerman: Treating Livy Ross/Extender: Garfield Cornea in Treatment: 0 Active Inactive Venous Leg Ulcer Nursing Diagnoses: Knowledge deficit related to disease process and management Potential for venous Insuffiency (use before diagnosis  confirmed) Goals: Patient will maintain optimal edema control Date Initiated: 06/10/2020 Target Resolution Date: 07/08/2020 Goal Status: Active Interventions: Assess peripheral edema status every visit. Compression as ordered Provide education on venous insufficiency Treatment Activities: Therapeutic compression applied : 06/10/2020 Notes: Wound/Skin Impairment Nursing Diagnoses: Impaired tissue integrity Knowledge deficit related to ulceration/compromised skin integrity Goals: Patient/caregiver will verbalize understanding of skin care regimen Date Initiated: 06/10/2020 Target Resolution Date: 07/08/2020 Goal Status: Active Ulcer/skin breakdown will have a volume reduction of 30% by week 4 Date Initiated: 06/10/2020 Target Resolution Date: 07/08/2020 Goal Status: Active Interventions: Assess patient/caregiver ability to obtain necessary supplies Assess patient/caregiver ability to perform ulcer/skin care regimen upon admission and as needed Assess ulceration(s) every visit Provide education on ulcer and skin care Treatment Activities: Skin care regimen initiated : 06/10/2020 Topical wound management initiated : 06/10/2020 Notes: Electronic Signature(s) Signed: 06/10/2020 5:03:51 PM By: Baruch Gouty RN, BSN Entered By: Baruch Gouty on 06/10/2020 15:47:52 -------------------------------------------------------------------------------- Pain Assessment Details Patient Name: Date of Service: [name], Rachel Roys D. 06/10/2020 2:45 PM Medical Record Number: [id number] Patient Account Number: [id number] Date of Birth/Sex: Treating RN: 15-Mar-1961 (59 y.o. Orvan Falconer Primary Care Charde Macfarlane: [employer] Other Clinician: Referring Brin Ruggerio: Treating Chellsie Gomer/Extender: Garfield Cornea in Treatment: 0 Active Problems Location of Pain Severity and Description of Pain Patient Has Paino No Site Locations Pain Management and Medication Current Pain  Management: Electronic Signature(s) Signed: 06/10/2020 5:20:03 PM By: Carlene Coria RN Entered By: Carlene Coria on 06/10/2020 15:25:47 -------------------------------------------------------------------------------- Patient/Caregiver Education Details Patient Name: Date of Service: Rachel June D. 10/4/2021andnbsp2:45 PM Medical Record Number: [id number] Patient Account Number: [id number] Date of Birth/Gender: Treating RN: 12-Apr-1961 (59 y.o. Elam Dutch Primary Care Physician: [employer] Other Clinician: Referring Physician: Treating Physician/Extender: Garfield Cornea in Treatment: 0 Education Assessment Education Provided To: Patient Education Topics Provided Wound/Skin Impairment: Handouts: Caring for Your Ulcer, Skin Care Do's and Dont's Methods: Explain/Verbal Responses: Reinforcements needed, State content correctly Electronic Signature(s) Signed: 06/10/2020 5:03:51 PM By: Baruch Gouty RN, BSN Entered By: Baruch Gouty on 06/10/2020 15:48:55 -------------------------------------------------------------------------------- Wound Assessment Details Patient Name: Date of Service: [name], Rachel Roys D. 06/10/2020 2:45 PM Medical Record Number: [id number] Patient Account Number: [id number] Date of Birth/Sex: Treating RN: 06-27-1961 (59 y.o. Orvan Falconer Primary Care Darrel Gloss: [employer] Other Clinician: Referring Zayyan Mullen: Treating Tayleigh Wetherell/Extender: Garfield Cornea in Treatment: 0 Wound Status Wound Number: 1 Primary Etiology: Inflammatory Wound Location: Right Lower Leg Wound Status: Open Wounding Event: Trauma Date Acquired: 04/07/2020 Weeks Of Treatment: 0 Clustered Wound: No Wound Measurements Length: (cm) 4 Width: (cm) 4.5 Depth: (cm) 0.1 Area: (cm) 14.137 Volume: (cm) 1.414 % Reduction in Area: % Reduction in Volume: Epithelialization:  None Tunneling: No Undermining: No Wound  Description Classification: Full Thickness Without Exposed Support Structures Exudate Amount: Medium Exudate Type: Serosanguineous Exudate Color: red, brown Foul Odor After Cleansing: No Slough/Fibrino Yes Wound Bed Granulation Amount: Medium (34-66%) Exposed Structure Granulation Quality: Red Fascia Exposed: No Necrotic Amount: Medium (34-66%) Fat Layer (Subcutaneous Tissue) Exposed: Yes Necrotic Quality: Adherent Slough Tendon Exposed: No Muscle Exposed: No Joint Exposed: No Bone Exposed: No Treatment Notes Wound #1 (Right Lower Leg) 1. Cleanse With Wound Cleanser 3. Primary Dressing Applied Other primary dressing (specifiy in notes) 4. Secondary Dressing Dry Gauze Roll Gauze 6. Support Layer Education officer, community) Signed: 06/10/2020 5:20:03 PM By: Carlene Coria RN Entered By: Carlene Coria on 06/10/2020 15:25:35 -------------------------------------------------------------------------------- Vitals Details Patient Name: Date of Service: [name], Rachel Roys D. 06/10/2020 2:45 PM Medical Record Number: [id number] Patient Account Number: [id number] Date of Birth/Sex: Treating RN: 11-19-1960 (59 y.o. Orvan Falconer Primary Care Yamil Oelke: [employer] Other Clinician: Referring Syeda Prickett: Treating Marquett Bertoli/Extender: Garfield Cornea in Treatment: 0 Vital Signs Time Taken: 15:03 Temperature (F): 98 Height (in): 62 Pulse (bpm): 81 Source: Stated Respiratory Rate (breaths/min): 18 Weight (lbs): 206 Blood Pressure (mmHg): 113/73 Source: Stated Reference Range: 80 - 120 mg / dl Body Mass Index (BMI): 37.7 Electronic Signature(s) Signed: 06/10/2020 5:20:03 PM By: Carlene Coria RN Entered By: Carlene Coria on 06/10/2020 15:03:35

## 2020-06-21 ENCOUNTER — Encounter (HOSPITAL_BASED_OUTPATIENT_CLINIC_OR_DEPARTMENT_OTHER): Payer: PRIVATE HEALTH INSURANCE | Admitting: Internal Medicine

## 2020-06-24 ENCOUNTER — Other Ambulatory Visit: Payer: Self-pay

## 2020-06-24 ENCOUNTER — Encounter (HOSPITAL_BASED_OUTPATIENT_CLINIC_OR_DEPARTMENT_OTHER): Payer: PRIVATE HEALTH INSURANCE | Admitting: Internal Medicine

## 2020-06-24 DIAGNOSIS — S81811S Laceration without foreign body, right lower leg, sequela: Secondary | ICD-10-CM | POA: Diagnosis not present

## 2020-06-25 NOTE — Progress Notes (Signed)
Rachel, [name] ([id number]) Visit Report for 06/24/2020 Debridement Details Patient Name: Date of Service: [name], [name] 06/24/2020 9:30 A M Medical Record Number: [id number] Patient Account Number: [id number] Date of Birth/Sex: Treating RN: Nov 10, 1960 (59 y.o. Nancy Fetter Primary Care Provider: [employer] Other Clinician: Referring Provider: Treating Provider/Extender: Garfield Cornea in Treatment: 2 Debridement Performed for Assessment: Wound #1 Right,Anterior Lower Leg Performed By: Physician Ricard Dillon., MD Debridement Type: Debridement Level of Consciousness (Pre-procedure): Awake and Alert Pre-procedure Verification/Time Out Yes - 10:40 Taken: Start Time: 10:40 Pain Control: Other : Benzocaine 20% T Area Debrided (L x W): otal 4 (cm) x 4.4 (cm) = 17.6 (cm) Tissue and other material debrided: Viable, Non-Viable, Slough, Subcutaneous, Slough Level: Skin/Subcutaneous Tissue Debridement Description: Excisional Instrument: Curette Bleeding: Minimum Hemostasis Achieved: Pressure End Time: 10:41 Procedural Pain: 2 Post Procedural Pain: 0 Response to Treatment: Procedure was tolerated well Level of Consciousness (Post- Awake and Alert procedure): Post Debridement Measurements of Total Wound Length: (cm) 4 Width: (cm) 4.4 Depth: (cm) 0.3 Volume: (cm) 4.147 Character of Wound/Ulcer Post Debridement: Improved Post Procedure Diagnosis Same as Pre-procedure Electronic Signature(s) Signed: 06/24/2020 5:42:40 PM By: Levan Hurst RN, BSN Signed: 06/25/2020 4:22:08 PM By: Linton Ham MD Entered By: Linton Ham on 06/24/2020 10:50:18 -------------------------------------------------------------------------------- HPI Details Patient Name: Date of Service: [name], [name] 06/24/2020 9:30 A M Medical Record Number: [id number] Patient Account Number: [id number] Date of Birth/Sex: Treating RN: Mar 27, 1961 (59 y.o. Nancy Fetter Primary Care Provider: [employer] Other Clinician: Referring Provider: Treating Provider/Extender: Garfield Cornea in Treatment: 2 History of Present Illness HPI Description: ADMISSION 06/10/2020 This is a patient who suffered a dog scratch injury I believe sometime in August. She was seen in an urgent care on 05/09/2020 she was given doxycycline for secondary cellulitis noting that she had already been on a course of Augmentin. She was told to treat the wound with topical antibiotics. She followed up in urgent care at which time the wound was very pruritic. She was given a course of linezolid. Subsequently the patient got a course itraconazole orally from her primary doctor for rash around the wound and the pruritus. This seems to have helped remarkably according to the patient. In any case she comes here using Medihoney with a adhesive dressing. It turns out that she was seen at the [facility] clinic for 6 visits or so in August and September 2019 at that point she had an area on her posterior calf on the right. She comes in with a sizable list of demands including no mechanical debridement. She initially said that she was not sure she really needed to be here however I had a quick look at the wound and assured her that she did need to be here. Past medical history; lumbar radiculopathy, fifth metacarpal fracture, cellulitis of the left lower extremity ABI in this clinic was 1.06 on the right 10/18; 2-week follow-up. Patient is been using Medihoney. Her big complaint continues to be the extremely pruritic rash around the wounds. She has the 2 open areas. She has a small area medially that she scratched. She is quite convinced that she had no prior history of the skin issue in this area. She is on a systemic antifungal Electronic Signature(s) Signed: 06/25/2020 4:22:08 PM By: Linton Ham MD Entered By: Linton Ham on 06/24/2020  10:51:37 -------------------------------------------------------------------------------- Physical Exam Details Patient Name: Date of Service: [name], [name]. 06/24/2020 9:30 A  M Medical Record Number: [id number] Patient Account Number: [id number] Date of Birth/Sex: Treating RN: [birth year] (59 y.o. Nancy Fetter Primary Care Provider: [employer] Other Clinician: Referring Provider: Treating Provider/Extender: Garfield Cornea in Treatment: 2 Constitutional Sitting or standing Blood Pressure is within target range for patient.. Pulse regular and within target range for patient.Marland Kitchen Respirations regular, non-labored and within target range.. Temperature is normal and within the target range for the patient.Marland Kitchen Appears in no distress. Cardiovascular Pedal pulses are palpable. Notes Wound exam; right anterior lower leg. 2 small benign looking wounds with adherent slough. I debrided both of these with a #3 curette removing this and gritty subcutaneous tissue of these cleaned up quite nicely. The bigger issue is the surrounding erythema which is confluent nontender but the patient states extremely irritated and pruritic Electronic Signature(s) Signed: 06/25/2020 4:22:08 PM By: Linton Ham MD Entered By: Linton Ham on 06/24/2020 10:53:02 -------------------------------------------------------------------------------- Physician Orders Details Patient Name: Date of Service: [name], [name] 06/24/2020 9:30 A M Medical Record Number: [id number] Patient Account Number: [id number] Date of Birth/Sex: Treating RN: [birth year] (59 y.o. Nancy Fetter Primary Care Provider: [employer] Other Clinician: Referring Provider: Treating Provider/Extender: Garfield Cornea in Treatment: 2 Verbal / Phone Orders: No Diagnosis Coding ICD-10 Coding Code Description [phone] Laceration without foreign body, right lower leg,  sequela L97.818 Non-pressure chronic ulcer of other part of right lower leg with other specified severity I87.321 Chronic venous hypertension (idiopathic) with inflammation of right lower extremity Follow-up Appointments Return Appointment in 2 weeks. Dressing Change Frequency Wound #1 Right,Anterior Lower Leg Change dressing every day. Wound #2 Right,Medial Lower Leg Change dressing every day. Skin Barriers/Peri-Wound Care ntifungal cream - mixed with Triamcinolone to red/inflamed area A TCA Cream or Ointment - mixed with antifungal to red/inflamed area Wound Cleansing May shower and wash wound with soap and water. - scrub gently with gauze or clean wash cloth at the end of your shower Primary Wound Dressing Wound #1 Right,Anterior Lower Leg Iodoflex Wound #2 Right,Medial Lower Leg Iodoflex Secondary Dressing Wound #1 Right,Anterior Lower Leg Kerlix/Rolled Gauze Dry Gauze Other: - may use ace wrap or coban to secure Wound #2 Right,Medial Lower Leg Kerlix/Rolled Gauze Dry Gauze Other: - may use ace wrap or coban to secure Edema Control Avoid standing for long periods of time Elevate legs to the level of the heart or above for 30 minutes daily and/or when sitting, a frequency of: - throughout the day Exercise regularly Patient Medications llergies: cimetidine A Notifications Medication Indication Start End dermatitis 06/24/2020 Lotrisone DOSE topical 1 %-0.05 % cream - cream topical to affected area daily Electronic Signature(s) Signed: 06/24/2020 10:49:40 AM By: Linton Ham MD Entered By: Linton Ham on 06/24/2020 10:49:38 -------------------------------------------------------------------------------- Problem List Details Patient Name: Date of Service: [name], [name] 06/24/2020 9:30 A M Medical Record Number: [id number] Patient Account Number: [id number] Date of Birth/Sex: Treating RN: 1961/08/25 (59 y.o. Nancy Fetter Primary Care Provider:  [employer] Other Clinician: Referring Provider: Treating Provider/Extender: Garfield Cornea in Treatment: 2 Active Problems ICD-10 Encounter Code Description Active Date MDM Diagnosis S81.811S Laceration without foreign body, right lower leg, sequela 06/10/2020 No Yes L97.818 Non-pressure chronic ulcer of other part of right lower leg with other specified 06/10/2020 No Yes severity I87.321 Chronic venous hypertension (idiopathic) with inflammation of right lower 06/10/2020 No Yes extremity Inactive Problems Resolved Problems Electronic Signature(s) Signed: 06/25/2020 4:22:08 PM By: Linton Ham MD Entered By: Dellia Nims,  Dariush Mcnellis on 06/24/2020 10:49:59 -------------------------------------------------------------------------------- Progress Note Details Patient Name: Date of Service: AVNI, TRAORE 06/24/2020 9:30 A M Medical Record Number: [id number] Patient Account Number: [id number] Date of Birth/Sex: Treating RN: 02-21-1961 (59 y.o. Nancy Fetter Primary Care Provider: [employer] Other Clinician: Referring Provider: Treating Provider/Extender: Garfield Cornea in Treatment: 2 Subjective History of Present Illness (HPI) ADMISSION 06/10/2020 This is a patient who suffered a dog scratch injury I believe sometime in August. She was seen in an urgent care on 05/09/2020 she was given doxycycline for secondary cellulitis noting that she had already been on a course of Augmentin. She was told to treat the wound with topical antibiotics. She followed up in urgent care at which time the wound was very pruritic. She was given a course of linezolid. Subsequently the patient got a course itraconazole orally from her primary doctor for rash around the wound and the pruritus. This seems to have helped remarkably according to the patient. In any case she comes here using Medihoney with a adhesive dressing. It turns out that she was  seen at the [facility] clinic for 6 visits or so in August and September 2019 at that point she had an area on her posterior calf on the right. She comes in with a sizable list of demands including no mechanical debridement. She initially said that she was not sure she really needed to be here however I had a quick look at the wound and assured her that she did need to be here. Past medical history; lumbar radiculopathy, fifth metacarpal fracture, cellulitis of the left lower extremity ABI in this clinic was 1.06 on the right 10/18; 2-week follow-up. Patient is been using Medihoney. Her big complaint continues to be the extremely pruritic rash around the wounds. She has the 2 open areas. She has a small area medially that she scratched. She is quite convinced that she had no prior history of the skin issue in this area. She is on a systemic antifungal Objective Constitutional Sitting or standing Blood Pressure is within target range for patient.. Pulse regular and within target range for patient.Marland Kitchen Respirations regular, non-labored and within target range.. Temperature is normal and within the target range for the patient.Marland Kitchen Appears in no distress. Vitals Time Taken: 9:20 AM, Height: 62 in, Weight: 206 lbs, BMI: 37.7, Temperature: 98 F, Pulse: 96 bpm, Respiratory Rate: 18 breaths/min, Blood Pressure: 133/87 mmHg. Cardiovascular Pedal pulses are palpable. General Notes: Wound exam; right anterior lower leg. 2 small benign looking wounds with adherent slough. I debrided both of these with a #3 curette removing this and gritty subcutaneous tissue of these cleaned up quite nicely. The bigger issue is the surrounding erythema which is confluent nontender but the patient states extremely irritated and pruritic Integumentary (Hair, Skin) Wound #1 status is Open. Original cause of wound was Trauma. The wound is located on the Right,Anterior Lower Leg. The wound measures 4cm length x 4.4cm width x 0.3cm  depth; 13.823cm^2 area and 4.147cm^3 volume. There is Fat Layer (Subcutaneous Tissue) exposed. There is no tunneling or undermining noted. There is a medium amount of serosanguineous drainage noted. The wound margin is well defined and not attached to the wound base. There is small (1- 33%) red granulation within the wound bed. There is a large (67-100%) amount of necrotic tissue within the wound bed including Adherent Slough. Wound #2 status is Open. Original cause of wound was Trauma. The wound is located on the Right,Medial Lower  Leg. The wound measures 0.5cm length x 0.5cm width x 0.1cm depth; 0.196cm^2 area and 0.02cm^3 volume. There is Fat Layer (Subcutaneous Tissue) exposed. There is no tunneling or undermining noted. There is a small amount of serosanguineous drainage noted. The wound margin is distinct with the outline attached to the wound base. There is large (67- 100%) red, pink granulation within the wound bed. There is no necrotic tissue within the wound bed. Assessment Active Problems ICD-10 Laceration without foreign body, right lower leg, sequela Non-pressure chronic ulcer of other part of right lower leg with other specified severity Chronic venous hypertension (idiopathic) with inflammation of right lower extremity Procedures Wound #1 Pre-procedure diagnosis of Wound #1 is an Inflammatory located on the Right,Anterior Lower Leg . There was a Excisional Skin/Subcutaneous Tissue Debridement with a total area of 17.6 sq cm performed by Ricard Dillon., MD. With the following instrument(s): Curette to remove Viable and Non-Viable tissue/material. Material removed includes Subcutaneous Tissue and Slough and after achieving pain control using Other (Benzocaine 20%). No specimens were taken. A time out was conducted at 10:40, prior to the start of the procedure. A Minimum amount of bleeding was controlled with Pressure. The procedure was tolerated well with a pain level of 2  throughout and a pain level of 0 following the procedure. Post Debridement Measurements: 4cm length x 4.4cm width x 0.3cm depth; 4.147cm^3 volume. Character of Wound/Ulcer Post Debridement is improved. Post procedure Diagnosis Wound #1: Same as Pre-Procedure Plan Follow-up Appointments: Return Appointment in 2 weeks. Dressing Change Frequency: Wound #1 Right,Anterior Lower Leg: Change dressing every day. Wound #2 Right,Medial Lower Leg: Change dressing every day. Skin Barriers/Peri-Wound Care: Antifungal cream - mixed with Triamcinolone to red/inflamed area TCA Cream or Ointment - mixed with antifungal to red/inflamed area Wound Cleansing: May shower and wash wound with soap and water. - scrub gently with gauze or clean wash cloth at the end of your shower Primary Wound Dressing: Wound #1 Right,Anterior Lower Leg: Iodoflex Wound #2 Right,Medial Lower Leg: Iodoflex Secondary Dressing: Wound #1 Right,Anterior Lower Leg: Kerlix/Rolled Gauze Dry Gauze Other: - may use ace wrap or coban to secure Wound #2 Right,Medial Lower Leg: Kerlix/Rolled Gauze Dry Gauze Other: - may use ace wrap or coban to secure Edema Control: Avoid standing for long periods of time Elevate legs to the level of the heart or above for 30 minutes daily and/or when sitting, a frequency of: - throughout the day Exercise regularly The following medication(s) was prescribed: Lotrisone topical 1 %-0.05 % cream cream topical to affected area daily for dermatitis starting 06/24/2020 1. I have change the primary dressing to Iodoflex from Medihoney in case this has something to do with a chronic contact dermatitis. I cannot see how the Medihoney would do this although right now I am running out of explanations 2. The patient is on a systemic antifungal and I think this should have had an effect by now. 3. I have given her prescription for Lotrisone to the periwound. 4. I have asked her to make sure to be careful not to  get any dressing on the irritated skin 5. If this does not work then dermatology this 6. This started as a dog scratch. She had 3 rounds of antibiotics. Did does not appear to be a bacterial infection issue Electronic Signature(s) Signed: 06/25/2020 4:22:08 PM By: Linton Ham MD Entered By: Linton Ham on 06/24/2020 10:54:40 -------------------------------------------------------------------------------- SuperBill Details Patient Name: Date of Service: [name], [name] 06/24/2020 Medical Record Number:  [id number] Patient Account Number: [id number] Date of Birth/Sex: Treating RN: [birth year] (59 y.o. Nancy Fetter Primary Care Provider: [employer] Other Clinician: Referring Provider: Treating Provider/Extender: Garfield Cornea in Treatment: 2 Diagnosis Coding ICD-10 Codes Code Description [phone] Laceration without foreign body, right lower leg, sequela L97.818 Non-pressure chronic ulcer of other part of right lower leg with other specified severity I87.321 Chronic venous hypertension (idiopathic) with inflammation of right lower extremity Facility Procedures CPT4 Code: [id number] Description: 19597 - DEB SUBQ TISSUE 20 SQ CM/< ICD-10 Diagnosis Description S81.811S Laceration without foreign body, right lower leg, sequela L97.818 Non-pressure chronic ulcer of other part of right lower leg with other specified Modifier: severity Quantity: 1 Physician Procedures : CPT4 Code Description Modifier [id number] - WC PHYS SUBQ TISS 20 SQ CM ICD-10 Diagnosis Description Z58.682B Laceration without foreign body, right lower leg, sequela L97.818 Non-pressure chronic ulcer of other part of right lower leg with other  specified severity Quantity: 1 Electronic Signature(s) Signed: 06/25/2020 4:22:08 PM By: Linton Ham MD Entered By: Linton Ham on 06/24/2020 10:54:53

## 2020-06-25 NOTE — Progress Notes (Signed)
[name], [name] ([id number]) Visit Report for 06/24/2020 Arrival Information Details Patient Name: Date of Service: [name], [name] 06/24/2020 9:30 A M Medical Record Number: [id number] Patient Account Number: [id number] Date of Birth/Sex: Treating RN: [birth year] (59 y.o. Hollie Salk, Larene Beach Primary Care Dwyane Dupree: [employer] Other Clinician: Referring Perrin Gens: Treating Ameliarose Shark/Extender: Garfield Cornea in Treatment: 2 Visit Information History Since Last Visit Added or deleted any medications: No Patient Arrived: Ambulatory Any new allergies or adverse reactions: No Arrival Time: 09:31 Had a fall or experienced change in No Accompanied By: self activities of daily living that may affect Transfer Assistance: None risk of falls: Patient Identification Verified: Yes Signs or symptoms of abuse/neglect since last visito No Secondary Verification Process Completed: Yes Hospitalized since last visit: No Patient Requires Transmission-Based Precautions: No Implantable device outside of the clinic excluding No Patient Has Alerts: No cellular tissue based products placed in the center since last visit: Has Dressing in Place as Prescribed: Yes Pain Present Now: No Electronic Signature(s) Signed: 06/25/2020 11:49:38 AM By: Kela Millin Entered By: Kela Millin on 06/24/2020 09:34:55 -------------------------------------------------------------------------------- Encounter Discharge Information Details Patient Name: Date of Service: [name], Rachel Roys D. 06/24/2020 9:30 A M Medical Record Number: [id number] Patient Account Number: [id number] Date of Birth/Sex: Treating RN: [birth year] (59 y.o. Debby Bud Primary Care Kathyjo Briere: [employer] Other Clinician: Referring Marshayla Mitschke: Treating Neiko Trivedi/Extender: Garfield Cornea in Treatment: 2 Encounter Discharge Information Items Post Procedure Vitals Discharge  Condition: Stable Temperature (F): 98 Ambulatory Status: Ambulatory Pulse (bpm): 96 Discharge Destination: Home Respiratory Rate (breaths/min): 18 Transportation: Private Auto Blood Pressure (mmHg): 133/87 Accompanied By: self Schedule Follow-up Appointment: Yes Clinical Summary of Care: Patient Declined Electronic Signature(s) Signed: 06/24/2020 5:10:30 PM By: Deon Pilling Entered By: Deon Pilling on 06/24/2020 10:59:51 -------------------------------------------------------------------------------- Lower Extremity Assessment Details Patient Name: Date of Service: [name], [name]. 06/24/2020 9:30 A M Medical Record Number: [id number] Patient Account Number: [id number] Date of Birth/Sex: Treating RN: 1961-08-24 (59 y.o. Clearnce Sorrel Primary Care Kisean Rollo: [employer] Other Clinician: Referring Tarini Carrier: Treating Declan Mier/Extender: Garfield Cornea in Treatment: 2 Edema Assessment Assessed: Shirlyn Goltz: No] Patrice Paradise: No] Edema: [Left: Ye] [Right: s] Calf Left: Right: Point of Measurement: 39 cm From Medial Instep 39 cm Ankle Left: Right: Point of Measurement: 10 cm From Medial Instep 24.5 cm Vascular Assessment Pulses: Dorsalis Pedis Palpable: [Right:Yes] Electronic Signature(s) Signed: 06/25/2020 11:49:38 AM By: Kela Millin Entered By: Kela Millin on 06/24/2020 09:36:02 -------------------------------------------------------------------------------- Multi Wound Chart Details Patient Name: Date of Service: [name], Rachel Roys D. 06/24/2020 9:30 A M Medical Record Number: [id number] Patient Account Number: [id number] Date of Birth/Sex: Treating RN: 10-Jul-1961 (59 y.o. Nancy Fetter Primary Care Demontray Franta: [employer] Other Clinician: Referring Niaya Hickok: Treating Khiree Bukhari/Extender: Garfield Cornea in Treatment: 2 Vital Signs Height(in): 62 Pulse(bpm): 96 Weight(lbs): 206 Blood  Pressure(mmHg): 133/87 Body Mass Index(BMI): 38 Temperature(F): 98 Respiratory Rate(breaths/min): 18 Photos: [1:No Photos Right Lower Leg] [2:No Photos Right, Medial Lower Leg] [N/A:N/A N/A] Wound Location: [1:Trauma] [2:Trauma] [N/A:N/A] Wounding Event: [1:Inflammatory] [2:Skin Tear] [N/A:N/A] Primary Etiology: [1:04/07/2020] [2:06/20/2020] [N/A:N/A] Date Acquired: [1:2] [2:0] [N/A:N/A] Weeks of Treatment: [1:Open] [2:Open] [N/A:N/A] Wound Status: [1:4x4.4x0.3] [2:0.5x0.5x0.1] [N/A:N/A] Measurements L x W x D (cm) [1:13.823] [2:0.196] [N/A:N/A] A (cm) : rea [1:4.147] [2:0.02] [N/A:N/A] Volume (cm) : [1:2.20%] [2:0.00%] [N/A:N/A] % Reduction in Area: [1:-193.30%] [2:0.00%] [N/A:N/A] % Reduction in Volume: [1:Full Thickness Without Exposed] [2:Full Thickness Without Exposed] [N/A:N/A] Classification: [1:Support Structures Medium] [2:Support  Structures Small] [N/A:N/A] Exudate A mount: [1:Serosanguineous] [2:Serosanguineous] [N/A:N/A] Exudate Type: [1:red, brown] [2:red, brown] [N/A:N/A] Exudate Color: [1:Well defined, not attached] [2:Distinct, outline attached] [N/A:N/A] Wound Margin: [1:Small (1-33%)] [2:Large (67-100%)] [N/A:N/A] Granulation A mount: [1:Red] [2:Red, Pink] [N/A:N/A] Granulation Quality: [1:Large (67-100%)] [2:None Present (0%)] [N/A:N/A] Necrotic A mount: [1:Fat Layer (Subcutaneous Tissue): Yes Fat Layer (Subcutaneous Tissue): Yes N/A] Exposed Structures: [1:Fascia: No Tendon: No Muscle: No Joint: No Bone: No None] [2:Fascia: No Tendon: No Muscle: No Joint: No Bone: No None] [N/A:N/A] Epithelialization: [1:Debridement - Excisional] [2:N/A] [N/A:N/A] Debridement: Pre-procedure Verification/Time Out 10:40 [2:N/A] [N/A:N/A] Taken: [1:Other] [2:N/A] [N/A:N/A] Pain Control: [1:Subcutaneous, Slough] [2:N/A] [N/A:N/A] Tissue Debrided: [1:Skin/Subcutaneous Tissue] [2:N/A] [N/A:N/A] Level: [1:17.6] [2:N/A] [N/A:N/A] Debridement A (sq cm): [1:rea Curette] [2:N/A]  [N/A:N/A] Instrument: [1:Minimum] [2:N/A] [N/A:N/A] Bleeding: [1:Pressure] [2:N/A] [N/A:N/A] Hemostasis A chieved: [1:2] [2:N/A] [N/A:N/A] Procedural Pain: [1:0] [2:N/A] [N/A:N/A] Post Procedural Pain: [1:Procedure was tolerated well] [2:N/A] [N/A:N/A] Debridement Treatment Response: [1:4x4.4x0.3] [2:N/A] [N/A:N/A] Post Debridement Measurements L x W x D (cm) [1:4.147] [2:N/A] [N/A:N/A] Post Debridement Volume: (cm) [1:Debridement] [2:N/A] [N/A:N/A] Treatment Notes Electronic Signature(s) Signed: 06/24/2020 5:42:40 PM By: Levan Hurst RN, BSN Signed: 06/25/2020 4:22:08 PM By: Linton Ham MD Entered By: Linton Ham on 06/24/2020 10:50:07 -------------------------------------------------------------------------------- Multi-Disciplinary Care Plan Details Patient Name: Date of Service: [name], Rachel Roys D. 06/24/2020 9:30 A M Medical Record Number: [id number] Patient Account Number: [id number] Date of Birth/Sex: Treating RN: 1961/01/03 (59 y.o. Nancy Fetter Primary Care Dean Wonder: [employer] Other Clinician: Referring Ilse Billman: Treating Tonette Koehne/Extender: Garfield Cornea in Treatment: 2 Active Inactive Venous Leg Ulcer Nursing Diagnoses: Knowledge deficit related to disease process and management Potential for venous Insuffiency (use before diagnosis confirmed) Goals: Patient will maintain optimal edema control Date Initiated: 06/10/2020 Target Resolution Date: 07/08/2020 Goal Status: Active Interventions: Assess peripheral edema status every visit. Compression as ordered Provide education on venous insufficiency Treatment Activities: Therapeutic compression applied : 06/10/2020 Notes: Wound/Skin Impairment Nursing Diagnoses: Impaired tissue integrity Knowledge deficit related to ulceration/compromised skin integrity Goals: Patient/caregiver will verbalize understanding of skin care regimen Date Initiated: 06/10/2020 Target  Resolution Date: 07/08/2020 Goal Status: Active Ulcer/skin breakdown will have a volume reduction of 30% by week 4 Date Initiated: 06/10/2020 Target Resolution Date: 07/08/2020 Goal Status: Active Interventions: Assess patient/caregiver ability to obtain necessary supplies Assess patient/caregiver ability to perform ulcer/skin care regimen upon admission and as needed Assess ulceration(s) every visit Provide education on ulcer and skin care Treatment Activities: Skin care regimen initiated : 06/10/2020 Topical wound management initiated : 06/10/2020 Notes: Electronic Signature(s) Signed: 06/24/2020 5:42:40 PM By: Levan Hurst RN, BSN Entered By: Levan Hurst on 06/24/2020 10:19:29 -------------------------------------------------------------------------------- Pain Assessment Details Patient Name: Date of Service: [name], Rachel Roys D. 06/24/2020 9:30 A M Medical Record Number: [id number] Patient Account Number: [id number] Date of Birth/Sex: Treating RN: 01-04-1961 (59 y.o. Clearnce Sorrel Primary Care Martiza Speth: [employer] Other Clinician: Referring Amarie Tarte: Treating Briar Witherspoon/Extender: Garfield Cornea in Treatment: 2 Active Problems Location of Pain Severity and Description of Pain Patient Has Paino No Site Locations Pain Management and Medication Current Pain Management: Electronic Signature(s) Signed: 06/25/2020 11:49:38 AM By: Kela Millin Entered By: Kela Millin on 06/24/2020 09:35:28 -------------------------------------------------------------------------------- Patient/Caregiver Education Details Patient Name: Date of Service: Rachel June D. 10/18/2021andnbsp9:30 A M Medical Record Number: [id number] Patient Account Number: [id number] Date of Birth/Gender: Treating RN: 1960/11/19 (59 y.o. Nancy Fetter Primary Care Physician: [employer] Other Clinician: Referring Physician: Treating Physician/Extender:  Garfield Cornea in  Treatment: 2 Education Assessment Education Provided To: Patient Education Topics Provided Wound/Skin Impairment: Methods: Explain/Verbal Responses: State content correctly Electronic Signature(s) Signed: 06/24/2020 5:42:40 PM By: Levan Hurst RN, BSN Entered By: Levan Hurst on 06/24/2020 10:19:43 -------------------------------------------------------------------------------- Wound Assessment Details Patient Name: Date of Service: [name], Rachel Roys D. 06/24/2020 9:30 A M Medical Record Number: [id number] Patient Account Number: [id number] Date of Birth/Sex: Treating RN: 04/27/1961 (59 y.o. Clearnce Sorrel Primary Care Marquia Costello: [employer] Other Clinician: Referring Chrissie Dacquisto: Treating Bryann Mcnealy/Extender: Garfield Cornea in Treatment: 2 Wound Status Wound Number: 1 Primary Etiology: Inflammatory Wound Location: Right Lower Leg Wound Status: Open Wounding Event: Trauma Date Acquired: 04/07/2020 Weeks Of Treatment: 2 Clustered Wound: No Photos Photo Uploaded By: Mikeal Hawthorne on 06/25/2020 13:12:27 Wound Measurements Length: (cm) 4 Width: (cm) 4.4 Depth: (cm) 0.3 Area: (cm) 13.823 Volume: (cm) 4.147 % Reduction in Area: 2.2% % Reduction in Volume: -193.3% Epithelialization: None Tunneling: No Undermining: No Wound Description Classification: Full Thickness Without Exposed Support Structures Wound Margin: Well defined, not attached Exudate Amount: Medium Exudate Type: Serosanguineous Exudate Color: red, brown Foul Odor After Cleansing: No Slough/Fibrino Yes Wound Bed Granulation Amount: Small (1-33%) Exposed Structure Granulation Quality: Red Fascia Exposed: No Necrotic Amount: Large (67-100%) Fat Layer (Subcutaneous Tissue) Exposed: Yes Necrotic Quality: Adherent Slough Tendon Exposed: No Muscle Exposed: No Joint Exposed: No Bone Exposed: No Electronic Signature(s) Signed:  06/25/2020 11:49:38 AM By: Kela Millin Entered By: Kela Millin on 06/24/2020 09:37:12 -------------------------------------------------------------------------------- Wound Assessment Details Patient Name: Date of Service: [name], Rachel Roys D. 06/24/2020 9:30 A M Medical Record Number: [id number] Patient Account Number: [id number] Date of Birth/Sex: Treating RN: February 09, 1961 (59 y.o. Clearnce Sorrel Primary Care Ionna Avis: [employer] Other Clinician: Referring Sarkis Rhines: Treating Delayza Lungren/Extender: Garfield Cornea in Treatment: 2 Wound Status Wound Number: 2 Primary Etiology: Skin Tear Wound Location: Right, Medial Lower Leg Wound Status: Open Wounding Event: Trauma Date Acquired: 06/20/2020 Weeks Of Treatment: 0 Clustered Wound: No Photos Photo Uploaded By: Mikeal Hawthorne on 06/25/2020 13:12:28 Wound Measurements Length: (cm) 0.5 Width: (cm) 0.5 Depth: (cm) 0.1 Area: (cm) 0.196 Volume: (cm) 0.02 % Reduction in Area: 0% % Reduction in Volume: 0% Epithelialization: None Tunneling: No Undermining: No Wound Description Classification: Full Thickness Without Exposed Support Struct Wound Margin: Distinct, outline attached Exudate Amount: Small Exudate Type: Serosanguineous Exudate Color: red, brown ures Foul Odor After Cleansing: No Slough/Fibrino No Wound Bed Granulation Amount: Large (67-100%) Exposed Structure Granulation Quality: Red, Pink Fascia Exposed: No Necrotic Amount: None Present (0%) Fat Layer (Subcutaneous Tissue) Exposed: Yes Tendon Exposed: No Muscle Exposed: No Joint Exposed: No Bone Exposed: No Treatment Notes Wound #2 (Right, Medial Lower Leg) 1. Cleanse With Wound Cleanser 2. Periwound Care Antifungal cream TCA Cream 3. Primary Dressing Applied Iodoflex 4. Secondary Dressing ABD Pad Dry Gauze Roll Gauze 5. Secured With Tape 6. Support Layer Education officer, community) Signed:  06/25/2020 11:49:38 AM By: Kela Millin Entered By: Kela Millin on 06/24/2020 09:41:43 -------------------------------------------------------------------------------- Vitals Details Patient Name: Date of Service: [name], Rachel Roys D. 06/24/2020 9:30 A M Medical Record Number: [id number] Patient Account Number: [id number] Date of Birth/Sex: Treating RN: [birth year] (59 y.o. F) Dwiggins, Larene Beach Primary Care Renatha Rosen: Other Clinician: [employer] Referring Milen Lengacher: Treating Shirelle Tootle/Extender: Garfield Cornea in Treatment: 2 Vital Signs Time Taken: 09:20 Temperature (F): 98 Height (in): 62 Pulse (bpm): 96 Weight (lbs): 206 Respiratory Rate (breaths/min): 18 Body Mass Index (BMI): 37.7 Blood Pressure (mmHg): 133/87 Reference Range:  80 - 120 mg / dl Electronic Signature(s) Signed: 06/25/2020 11:49:38 AM By: Kela Millin Entered By: Kela Millin on 06/24/2020 09:35:22

## 2020-06-26 ENCOUNTER — Ambulatory Visit: Payer: PRIVATE HEALTH INSURANCE | Admitting: Internal Medicine

## 2020-07-08 ENCOUNTER — Encounter (HOSPITAL_BASED_OUTPATIENT_CLINIC_OR_DEPARTMENT_OTHER): Payer: PRIVATE HEALTH INSURANCE | Attending: Internal Medicine | Admitting: Internal Medicine

## 2020-07-08 ENCOUNTER — Other Ambulatory Visit: Payer: Self-pay

## 2020-07-08 DIAGNOSIS — L97818 Non-pressure chronic ulcer of other part of right lower leg with other specified severity: Secondary | ICD-10-CM | POA: Insufficient documentation

## 2020-07-08 DIAGNOSIS — I87331 Chronic venous hypertension (idiopathic) with ulcer and inflammation of right lower extremity: Secondary | ICD-10-CM | POA: Insufficient documentation

## 2020-07-08 NOTE — Progress Notes (Signed)
LASSIE, DEMOREST ([id number]) Visit Report for 07/08/2020 Arrival Information Details Patient Name: Date of Service: [name], [name] 07/08/2020 9:30 A M Medical Record Number: [id number] Patient Account Number: [id number] Date of Birth/Sex: Treating RN: 05/09/1961 (58 y.o. Benjamine Sprague, Briant Cedar Primary Care Miles Leyda: [employer] Other Clinician: Referring Malcolm Hetz: Treating Asher Babilonia/Extender: Garfield Cornea in Treatment: 4 Visit Information History Since Last Visit Added or deleted any medications: No Patient Arrived: Ambulatory Any new allergies or adverse reactions: No Arrival Time: 09:45 Had a fall or experienced change in No Accompanied By: self activities of daily living that may affect Transfer Assistance: None risk of falls: Patient Identification Verified: Yes Signs or symptoms of abuse/neglect since last visito No Secondary Verification Process Completed: Yes Hospitalized since last visit: No Patient Requires Transmission-Based Precautions: No Implantable device outside of the clinic excluding No Patient Has Alerts: No cellular tissue based products placed in the center since last visit: Has Dressing in Place as Prescribed: Yes Pain Present Now: No Electronic Signature(s) Signed: 07/08/2020 3:20:21 PM By: Sandre Kitty Entered By: Sandre Kitty on 07/08/2020 09:46:34 -------------------------------------------------------------------------------- Encounter Discharge Information Details Patient Name: Date of Service: [name], Rachel Roys D. 07/08/2020 9:30 A M Medical Record Number: [id number] Patient Account Number: [id number] Date of Birth/Sex: Treating RN: 07/28/1961 (59 y.o. Debby Bud Primary Care Glade Strausser: [employer] Other Clinician: Referring Bookert Guzzi: Treating Daneli Butkiewicz/Extender: Garfield Cornea in Treatment: 4 Encounter Discharge Information Items Post Procedure Vitals Discharge Condition:  Stable Temperature (F): 98.3 Ambulatory Status: Ambulatory Pulse (bpm): 99 Discharge Destination: Home Respiratory Rate (breaths/min): 18 Transportation: Private Auto Blood Pressure (mmHg): 128/82 Accompanied By: self Schedule Follow-up Appointment: Yes Clinical Summary of Care: Patient Declined Electronic Signature(s) Signed: 07/08/2020 6:07:40 PM By: Deon Pilling Entered By: Deon Pilling on 07/08/2020 10:16:29 -------------------------------------------------------------------------------- Lower Extremity Assessment Details Patient Name: Date of Service: [name], [name]. 07/08/2020 9:30 A M Medical Record Number: [id number] Patient Account Number: [id number] Date of Birth/Sex: Treating RN: 1960/11/30 (59 y.o. Elam Dutch Primary Care Blayn Whetsell: [employer] Other Clinician: Referring Marylynn Rigdon: Treating Laiyla Slagel/Extender: Garfield Cornea in Treatment: 4 Edema Assessment Assessed: Shirlyn Goltz: No] Patrice Paradise: No] Edema: [Left: Ye] [Right: s] Calf Left: Right: Point of Measurement: 39 cm From Medial Instep 36.2 cm Ankle Left: Right: Point of Measurement: 10 cm From Medial Instep 24 cm Vascular Assessment Pulses: Dorsalis Pedis Palpable: [Right:Yes] Electronic Signature(s) Signed: 07/08/2020 5:46:37 PM By: Baruch Gouty RN, BSN Entered By: Baruch Gouty on 07/08/2020 09:52:09 -------------------------------------------------------------------------------- Multi Wound Chart Details Patient Name: Date of Service: [name], Rachel Roys D. 07/08/2020 9:30 A M Medical Record Number: [id number] Patient Account Number: [id number] Date of Birth/Sex: Treating RN: 1961-03-30 (59 y.o. Nancy Fetter Primary Care Lacretia Tindall: [employer] Other Clinician: Referring Carlisle Enke: Treating Leticia Coletta/Extender: Garfield Cornea in Treatment: 4 Vital Signs Height(in): 62 Pulse(bpm): 99 Weight(lbs): 206 Blood Pressure(mmHg):  128/82 Body Mass Index(BMI): 38 Temperature(F): 98.3 Respiratory Rate(breaths/min): 18 Photos: [1:No Photos Right, Anterior Lower Leg] [2:No Photos Right, Medial Lower Leg] [N/A:N/A N/A] Wound Location: [1:Trauma] [2:Trauma] [N/A:N/A] Wounding Event: [1:Inflammatory] [2:Skin Tear] [N/A:N/A] Primary Etiology: [1:04/07/2020] [2:06/20/2020] [N/A:N/A] Date Acquired: [1:4] [2:2] [N/A:N/A] Weeks of Treatment: [1:Open] [2:Open] [N/A:N/A] Wound Status: [1:1.9x1.1x0.2] [2:0.4x0.4x0.1] [N/A:N/A] Measurements L x W x D (cm) [1:1.641] [2:0.126] [N/A:N/A] A (cm) : rea [1:0.328] [2:0.013] [N/A:N/A] Volume (cm) : [1:88.40%] [2:35.70%] [N/A:N/A] % Reduction in Area: [1:76.80%] [2:35.00%] [N/A:N/A] % Reduction in Volume: [1:Full Thickness Without Exposed] [2:Full Thickness Without Exposed] [N/A:N/A] Classification: [1:Support  Structures Medium] [2:Support Structures Small] [N/A:N/A] Exudate A mount: [1:Serosanguineous] [2:Serosanguineous] [N/A:N/A] Exudate Type: [1:red, brown] [2:red, brown] [N/A:N/A] Exudate Color: [1:Distinct, outline attached] [2:Distinct, outline attached] [N/A:N/A] Wound Margin: [1:Medium (34-66%)] [2:Small (1-33%)] [N/A:N/A] Granulation A mount: [1:Red] [2:Red, Pink] [N/A:N/A] Granulation Quality: [1:Medium (34-66%)] [2:Small (1-33%)] [N/A:N/A] Necrotic A mount: [1:Fat Layer (Subcutaneous Tissue): Yes Fat Layer (Subcutaneous Tissue): Yes N/A] Exposed Structures: [1:Fascia: No Tendon: No Muscle: No Joint: No Bone: No Small (1-33%)] [2:Fascia: No Tendon: No Muscle: No Joint: No Bone: No Small (1-33%)] [N/A:N/A] Epithelialization: [1:Debridement - Excisional] [2:Debridement - Excisional] [N/A:N/A] Debridement: Pre-procedure Verification/Time Out 10:06 [2:10:06] [N/A:N/A] Taken: [1:Other] [2:Other] [N/A:N/A] Pain Control: [1:Subcutaneous, Slough] [2:Subcutaneous, Slough] [N/A:N/A] Tissue Debrided: [1:Skin/Subcutaneous Tissue] [2:Skin/Subcutaneous Tissue] [N/A:N/A] Level:  [1:2.09] [2:0.16] [N/A:N/A] Debridement A (sq cm): [1:rea Curette] [2:Curette] [N/A:N/A] Instrument: [1:Minimum] [2:Minimum] [N/A:N/A] Bleeding: [1:Pressure] [2:Pressure] [N/A:N/A] Hemostasis A chieved: [1:0] [2:0] [N/A:N/A] Procedural Pain: [1:0] [2:0] [N/A:N/A] Post Procedural Pain: [1:Procedure was tolerated well] [2:Procedure was tolerated well] [N/A:N/A] Debridement Treatment Response: [1:1.9x1.1x0.2] [2:0.4x0.4x0.1] [N/A:N/A] Post Debridement Measurements L x W x D (cm) [1:0.328] [2:0.013] [N/A:N/A] Post Debridement Volume: (cm) [1:N/A] [2:scabbed] [N/A:N/A] Assessment Notes: [1:Debridement] [2:Debridement] [N/A:N/A] Treatment Notes Electronic Signature(s) Signed: 07/08/2020 5:37:06 PM By: Linton Ham MD Signed: 07/08/2020 5:57:05 PM By: Levan Hurst RN, BSN Entered By: Linton Ham on 07/08/2020 10:10:30 -------------------------------------------------------------------------------- Multi-Disciplinary Care Plan Details Patient Name: Date of Service: [name], Rachel Roys D. 07/08/2020 9:30 A M Medical Record Number: [id number] Patient Account Number: [id number] Date of Birth/Sex: Treating RN: [birth year] (59 y.o. Nancy Fetter Primary Care Shiana Rappleye: [employer] Other Clinician: Referring Raimundo Corbit: Treating Altair Appenzeller/Extender: Garfield Cornea in Treatment: 4 Active Inactive Wound/Skin Impairment Nursing Diagnoses: Impaired tissue integrity Knowledge deficit related to ulceration/compromised skin integrity Goals: Patient/caregiver will verbalize understanding of skin care regimen Date Initiated: 06/10/2020 Target Resolution Date: 08/09/2020 Goal Status: Active Ulcer/skin breakdown will have a volume reduction of 30% by week 4 Date Initiated: 06/10/2020 Date Inactivated: 07/08/2020 Target Resolution Date: 07/08/2020 Goal Status: Met Ulcer/skin breakdown will have a volume reduction of 50% by week 8 Date Initiated: 07/08/2020 Target  Resolution Date: 08/09/2020 Goal Status: Active Interventions: Assess patient/caregiver ability to obtain necessary supplies Assess patient/caregiver ability to perform ulcer/skin care regimen upon admission and as needed Assess ulceration(s) every visit Provide education on ulcer and skin care Treatment Activities: Skin care regimen initiated : 06/10/2020 Topical wound management initiated : 06/10/2020 Notes: Electronic Signature(s) Signed: 07/08/2020 5:57:05 PM By: Levan Hurst RN, BSN Entered By: Levan Hurst on 07/08/2020 12:20:01 -------------------------------------------------------------------------------- Pain Assessment Details Patient Name: Date of Service: [name], Rachel Roys D. 07/08/2020 9:30 A M Medical Record Number: [id number] Patient Account Number: [id number] Date of Birth/Sex: Treating RN: [birth year] (59 y.o. Nancy Fetter Primary Care Kem Parcher: [employer] Other Clinician: Referring Iram Astorino: Treating Shaymus Eveleth/Extender: Garfield Cornea in Treatment: 4 Active Problems Location of Pain Severity and Description of Pain Patient Has Paino No Site Locations Rate the pain. Current Pain Level: 0 Pain Management and Medication Current Pain Management: Electronic Signature(s) Signed: 07/08/2020 5:46:37 PM By: Baruch Gouty RN, BSN Signed: 07/08/2020 5:57:05 PM By: Levan Hurst RN, BSN Entered By: Baruch Gouty on 07/08/2020 09:53:59 -------------------------------------------------------------------------------- Patient/Caregiver Education Details Patient Name: Date of Service: Rachel June D. 11/1/2021andnbsp9:30 A M Medical Record Number: [id number] Patient Account Number: [id number] Date of Birth/Gender: Treating RN: 05-31-1961 (59 y.o. Nancy Fetter Primary Care Physician: [employer] Other Clinician: Referring Physician: Treating Physician/Extender: Garfield Cornea in Treatment:  4 Education  Assessment Education Provided To: Patient Education Topics Provided Wound/Skin Impairment: Methods: Explain/Verbal Responses: State content correctly Electronic Signature(s) Signed: 07/08/2020 5:57:05 PM By: Levan Hurst RN, BSN Entered By: Levan Hurst on 07/08/2020 12:20:11 -------------------------------------------------------------------------------- Wound Assessment Details Patient Name: Date of Service: [name], Rachel Roys D. 07/08/2020 9:30 A M Medical Record Number: [id number] Patient Account Number: [id number] Date of Birth/Sex: Treating RN: 1961-06-19 (59 y.o. Elam Dutch Primary Care Kareem Cathey: [employer] Other Clinician: Referring Chihiro Frey: Treating Athel Merriweather/Extender: Garfield Cornea in Treatment: 4 Wound Status Wound Number: 1 Primary Etiology: Inflammatory Wound Location: Right, Anterior Lower Leg Wound Status: Open Wounding Event: Trauma Date Acquired: 04/07/2020 Weeks Of Treatment: 4 Clustered Wound: No Wound Measurements Length: (cm) 1.9 Width: (cm) 1.1 Depth: (cm) 0.2 Area: (cm) 1.641 Volume: (cm) 0.328 % Reduction in Area: 88.4% % Reduction in Volume: 76.8% Epithelialization: Small (1-33%) Tunneling: No Undermining: No Wound Description Classification: Full Thickness Without Exposed Support Structures Wound Margin: Distinct, outline attached Exudate Amount: Medium Exudate Type: Serosanguineous Exudate Color: red, brown Foul Odor After Cleansing: No Slough/Fibrino Yes Wound Bed Granulation Amount: Medium (34-66%) Exposed Structure Granulation Quality: Red Fascia Exposed: No Necrotic Amount: Medium (34-66%) Fat Layer (Subcutaneous Tissue) Exposed: Yes Necrotic Quality: Adherent Slough Tendon Exposed: No Muscle Exposed: No Joint Exposed: No Bone Exposed: No Treatment Notes Wound #1 (Right, Anterior Lower Leg) 1. Cleanse With Wound Cleanser Soap and water 2. Periwound Care Antifungal  cream TCA Cream 3. Primary Dressing Applied Iodoflex 4. Secondary Dressing Dry Gauze Roll Gauze 5. Secured With Medipore tape Notes ace wrap Electronic Signature(s) Signed: 07/08/2020 5:46:37 PM By: Baruch Gouty RN, BSN Entered By: Baruch Gouty on 07/08/2020 09:53:13 -------------------------------------------------------------------------------- Wound Assessment Details Patient Name: Date of Service: [name], Rachel Roys D. 07/08/2020 9:30 A M Medical Record Number: [id number] Patient Account Number: [id number] Date of Birth/Sex: Treating RN: [birth year] (59 y.o. Elam Dutch Primary Care Risa Auman: [employer] Other Clinician: Referring Macallan Ord: Treating Arline Ketter/Extender: Garfield Cornea in Treatment: 4 Wound Status Wound Number: 2 Primary Etiology: Skin Tear Wound Location: Right, Medial Lower Leg Wound Status: Open Wounding Event: Trauma Date Acquired: 06/20/2020 Weeks Of Treatment: 2 Clustered Wound: No Wound Measurements Length: (cm) 0.4 Width: (cm) 0.4 Depth: (cm) 0.1 Area: (cm) 0.126 Volume: (cm) 0.013 % Reduction in Area: 35.7% % Reduction in Volume: 35% Epithelialization: Small (1-33%) Tunneling: No Undermining: No Wound Description Classification: Full Thickness Without Exposed Support Structures Wound Margin: Distinct, outline attached Exudate Amount: Small Exudate Type: Serosanguineous Exudate Color: red, brown Foul Odor After Cleansing: No Slough/Fibrino No Wound Bed Granulation Amount: Small (1-33%) Exposed Structure Granulation Quality: Red, Pink Fascia Exposed: No Necrotic Amount: Small (1-33%) Fat Layer (Subcutaneous Tissue) Exposed: Yes Tendon Exposed: No Muscle Exposed: No Joint Exposed: No Bone Exposed: No Assessment Notes scabbed Treatment Notes Wound #2 (Right, Medial Lower Leg) 1. Cleanse With Wound Cleanser Soap and water 2. Periwound Care Antifungal cream TCA Cream 3. Primary  Dressing Applied Iodoflex 4. Secondary Dressing Dry Gauze Roll Gauze 5. Secured With Medipore tape Notes ace wrap Electronic Signature(s) Signed: 07/08/2020 5:46:37 PM By: Baruch Gouty RN, BSN Entered By: Baruch Gouty on 07/08/2020 09:53:38 -------------------------------------------------------------------------------- Martin Details Patient Name: Date of Service: [name], Rachel Roys D. 07/08/2020 9:30 A M Medical Record Number: [id number] Patient Account Number: [id number] Date of Birth/Sex: Treating RN: 1961-08-18 (59 y.o. Nancy Fetter Primary Care Camile Esters: [employer] Other Clinician: Referring Aireanna Luellen: Treating Akira Adelsberger/Extender: Garfield Cornea in Treatment: 4 Vital Signs Time Taken:  09:46 Temperature (F): 98.3 Height (in): 62 Pulse (bpm): 99 Weight (lbs): 206 Respiratory Rate (breaths/min): 18 Body Mass Index (BMI): 37.7 Blood Pressure (mmHg): 128/82 Reference Range: 80 - 120 mg / dl Electronic Signature(s) Signed: 07/08/2020 3:20:21 PM By: Sandre Kitty Entered By: Sandre Kitty on 07/08/2020 09:46:51

## 2020-07-08 NOTE — Progress Notes (Signed)
[name], [name] ([id number]) Visit Report for 07/08/2020 Debridement Details Patient Name: Date of Service: [name], [name] 07/08/2020 9:30 A M Medical Record Number: [id number] Patient Account Number: [id number] Date of Birth/Sex: Treating RN: [birth year] (59 y.o. Nancy Fetter Primary Care Provider: [employer] Other Clinician: Referring Provider: Treating Provider/Extender: Garfield Cornea in Treatment: 4 Debridement Performed for Assessment: Wound #1 Right,Anterior Lower Leg Performed By: Physician Ricard Dillon., MD Debridement Type: Debridement Level of Consciousness (Pre-procedure): Awake and Alert Pre-procedure Verification/Time Out Yes - 10:06 Taken: Start Time: 10:06 Pain Control: Other : Benzocaine 20% T Area Debrided (L x W): otal 1.9 (cm) x 1.1 (cm) = 2.09 (cm) Tissue and other material debrided: Viable, Non-Viable, Slough, Subcutaneous, Skin: Epidermis, Slough Level: Skin/Subcutaneous Tissue Debridement Description: Excisional Instrument: Curette Bleeding: Minimum Hemostasis Achieved: Pressure End Time: 10:07 Procedural Pain: 0 Post Procedural Pain: 0 Response to Treatment: Procedure was tolerated well Level of Consciousness (Post- Awake and Alert procedure): Post Debridement Measurements of Total Wound Length: (cm) 1.9 Width: (cm) 1.1 Depth: (cm) 0.2 Volume: (cm) 0.328 Character of Wound/Ulcer Post Debridement: Improved Post Procedure Diagnosis Same as Pre-procedure Electronic Signature(s) Signed: 07/08/2020 5:37:06 PM By: Linton Ham MD Signed: 07/08/2020 5:57:05 PM By: Levan Hurst RN, BSN Entered By: Linton Ham on 07/08/2020 10:10:38 -------------------------------------------------------------------------------- Debridement Details Patient Name: Date of Service: [name], [name] D. 07/08/2020 9:30 A M Medical Record Number: [id number] Patient Account Number: [id number] Date of Birth/Sex: Treating  RN: [birth year] (59 y.o. Benjamine Sprague, Briant Cedar Primary Care Provider: [employer] Other Clinician: Referring Provider: Treating Provider/Extender: Garfield Cornea in Treatment: 4 Debridement Performed for Assessment: Wound #2 Right,Medial Lower Leg Performed By: Physician Ricard Dillon., MD Debridement Type: Debridement Level of Consciousness (Pre-procedure): Awake and Alert Pre-procedure Verification/Time Out Yes - 10:06 Taken: Start Time: 10:06 Pain Control: Other : Benzocaine 20% T Area Debrided (L x W): otal 0.4 (cm) x 0.4 (cm) = 0.16 (cm) Tissue and other material debrided: Viable, Non-Viable, Slough, Subcutaneous, Skin: Epidermis, Slough Level: Skin/Subcutaneous Tissue Debridement Description: Excisional Instrument: Curette Bleeding: Minimum Hemostasis Achieved: Pressure End Time: 10:07 Procedural Pain: 0 Post Procedural Pain: 0 Response to Treatment: Procedure was tolerated well Level of Consciousness (Post- Awake and Alert procedure): Post Debridement Measurements of Total Wound Length: (cm) 0.4 Width: (cm) 0.4 Depth: (cm) 0.1 Volume: (cm) 0.013 Character of Wound/Ulcer Post Debridement: Improved Post Procedure Diagnosis Same as Pre-procedure Electronic Signature(s) Signed: 07/08/2020 5:37:06 PM By: Linton Ham MD Signed: 07/08/2020 5:57:05 PM By: Levan Hurst RN, BSN Entered By: Linton Ham on 07/08/2020 10:10:47 -------------------------------------------------------------------------------- HPI Details Patient Name: Date of Service: [name], [name] D. 07/08/2020 9:30 A M Medical Record Number: [id number] Patient Account Number: [id number] Date of Birth/Sex: Treating RN: [birth year] (59 y.o. Nancy Fetter Primary Care Provider: [employer] Other Clinician: Referring Provider: Treating Provider/Extender: Garfield Cornea in Treatment: 4 History of Present Illness HPI Description:  ADMISSION 06/10/2020 This is a patient who suffered a dog scratch injury I believe sometime in August. She was seen in an urgent care on 05/09/2020 she was given doxycycline for secondary cellulitis noting that she had already been on a course of Augmentin. She was told to treat the wound with topical antibiotics. She followed up in urgent care at which time the wound was very pruritic. She was given a course of linezolid. Subsequently the patient got a course itraconazole orally from her primary doctor for rash around the wound and the  pruritus. This seems to have helped remarkably according to the patient. In any case she comes here using Medihoney with a adhesive dressing. It turns out that she was seen at the [facility] clinic for 6 visits or so in August and September 2019 at that point she had an area on her posterior calf on the right. She comes in with a sizable list of demands including no mechanical debridement. She initially said that she was not sure she really needed to be here however I had a quick look at the wound and assured her that she did need to be here. Past medical history; lumbar radiculopathy, fifth metacarpal fracture, cellulitis of the left lower extremity ABI in this clinic was 1.06 on the right 10/18; 2-week follow-up. Patient is been using Medihoney. Her big complaint continues to be the extremely pruritic rash around the wounds. She has the 2 open areas. She has a small area medially that she scratched. She is quite convinced that she had no prior history of the skin issue in this area. She is on a systemic antifungal 11/one 2-week follow-up. I stopped the Medihoney last time and we have been using Iodoflex.And the pruritus in general around the wound resolved almost immediately. I also gave her some Lotrisone. She had previously been put on oral antifungals by her primary doctor. She been on this for a month I have told her I do not think that is necessary. 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 Her wounds are better. We are using Iodoflex Electronic Signature(s) Signed: 07/08/2020 5:37:06 PM By: Linton Ham MD Entered By: Linton Ham on 07/08/2020 10:11:47 -------------------------------------------------------------------------------- Physical Exam Details Patient Name: Date of Service: [name], [name] D. 07/08/2020 9:30 A M Medical Record Number: [id number] Patient Account Number: [id number] Date of Birth/Sex: Treating RN: 08-08-1961 (59 y.o. Nancy Fetter Primary Care Provider: [employer] Other Clinician: Referring Provider: Treating Provider/Extender: Garfield Cornea in Treatment: 4 Constitutional Sitting or standing Blood Pressure is within target range for patient.. Pulse regular and within target range for patient.Marland Kitchen Respirations regular, non-labored and within target range.. Temperature is normal and within the target range for the patient.Marland Kitchen Appears in no distress. Notes Wound exam; right anterior lower leg. There is 2 areas one is still got some depth the other is superficial user which is close by. Both wounds required debridement with a #5 curette of adherent debris on the surface hemostasis with direct pressure I am able to get this down to a healthy looking wound The periwound skin looks considerably better. Much less irritated Electronic Signature(s) Signed: 07/08/2020 5:37:06 PM By: Linton Ham MD Entered By: Linton Ham on 07/08/2020 10:12:44 -------------------------------------------------------------------------------- Physician Orders Details Patient Name: Date of Service: [name], [name] D. 07/08/2020 9:30 A M Medical Record Number: [id number] Patient Account Number: [id number] Date of Birth/Sex: Treating RN: 1960-11-07 (59 y.o. Nancy Fetter Primary Care Provider: [employer] Other Clinician: Referring  Provider: Treating Provider/Extender: Garfield Cornea in Treatment: 4 Verbal / Phone Orders: No Diagnosis Coding ICD-10 Coding Code Description [phone] Laceration without foreign body, right lower leg, sequela L97.818 Non-pressure chronic ulcer of other part of right lower leg with other specified severity I87.321 Chronic venous hypertension (idiopathic) with inflammation of right lower extremity Follow-up Appointments Return Appointment in 2 weeks. Dressing Change Frequency Wound #1 Right,Anterior Lower Leg Change dressing every day. Wound #2 Right,Medial Lower Leg Change dressing every day.  Skin Barriers/Peri-Wound Care ntifungal cream - mixed with Triamcinolone to red/inflamed area A TCA Cream or Ointment - mixed with antifungal to red/inflamed area Wound Cleansing May shower and wash wound with soap and water. - scrub gently with gauze or clean wash cloth at the end of your shower Primary Wound Dressing Wound #1 Right,Anterior Lower Leg Iodoflex Wound #2 Right,Medial Lower Leg Iodoflex Secondary Dressing Wound #1 Right,Anterior Lower Leg Kerlix/Rolled Gauze Dry Gauze Other: - may use ace wrap or coban to secure Wound #2 Right,Medial Lower Leg Kerlix/Rolled Gauze Dry Gauze Other: - may use ace wrap or coban to secure Edema Control Avoid standing for long periods of time Elevate legs to the level of the heart or above for 30 minutes daily and/or when sitting, a frequency of: - throughout the day Exercise regularly Electronic Signature(s) Signed: 07/08/2020 5:37:06 PM By: Linton Ham MD Signed: 07/08/2020 5:57:05 PM By: Levan Hurst RN, BSN Entered By: Levan Hurst on 07/08/2020 10:09:22 -------------------------------------------------------------------------------- Problem List Details Patient Name: Date of Service: [name], [name] D. 07/08/2020 9:30 A M Medical Record Number: [id number] Patient Account Number: [id number] Date of  Birth/Sex: Treating RN: [birth year] (59 y.o. Nancy Fetter Primary Care Provider: [employer] Other Clinician: Referring Provider: Treating Provider/Extender: Garfield Cornea in Treatment: 4 Active Problems ICD-10 Encounter Code Description Active Date MDM Diagnosis S81.811S Laceration without foreign body, right lower leg, sequela 06/10/2020 No Yes L97.818 Non-pressure chronic ulcer of other part of right lower leg with other specified 06/10/2020 No Yes severity I87.321 Chronic venous hypertension (idiopathic) with inflammation of right lower 06/10/2020 No Yes extremity Inactive Problems Resolved Problems Electronic Signature(s) Signed: 07/08/2020 5:37:06 PM By: Linton Ham MD Entered By: Linton Ham on 07/08/2020 10:10:22 -------------------------------------------------------------------------------- Progress Note Details Patient Name: Date of Service: [name], [name] D. 07/08/2020 9:30 A M Medical Record Number: [id number] Patient Account Number: [id number] Date of Birth/Sex: Treating RN: 1961-01-09 (59 y.o. Nancy Fetter Primary Care Provider: [employer] Other Clinician: Referring Provider: Treating Provider/Extender: Garfield Cornea in Treatment: 4 Subjective History of Present Illness (HPI) ADMISSION 06/10/2020 This is a patient who suffered a dog scratch injury I believe sometime in August. She was seen in an urgent care on 05/09/2020 she was given doxycycline for secondary cellulitis noting that she had already been on a course of Augmentin. She was told to treat the wound with topical antibiotics. She followed up in urgent care at which time the wound was very pruritic. She was given a course of linezolid. Subsequently the patient got a course itraconazole orally from her primary doctor for rash around the wound and the pruritus. This seems to have helped remarkably according to the patient. In any case  she comes here using Medihoney with a adhesive dressing. It turns out that she was seen at the [facility] clinic for 6 visits or so in August and September 2019 at that point she had an area on her posterior calf on the right. She comes in with a sizable list of demands including no mechanical debridement. She initially said that she was not sure she really needed to be here however I had a quick look at the wound and assured her that she did need to be here. Past medical history; lumbar radiculopathy, fifth metacarpal fracture, cellulitis of the left lower extremity ABI in this clinic was 1.06 on the right 10/18; 2-week follow-up. Patient is been using Medihoney. Her big complaint continues to be the extremely pruritic rash  around the wounds. She has the 2 open areas. She has a small area medially that she scratched. She is quite convinced that she had no prior history of the skin issue in this area. She is on a systemic antifungal 11/one 2-week follow-up. I stopped the Medihoney last time and we have been using Iodoflex.ooAnd the pruritus in general around the wound resolved almost immediately. I also gave her some Lotrisone. She had previously been put on oral antifungals by her primary doctor. She been on this for a month I have told her I do not think that is necessary. 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 Her wounds are better. We are using Iodoflex Objective Constitutional Sitting or standing Blood Pressure is within target range for patient.. Pulse regular and within target range for patient.Marland Kitchen Respirations regular, non-labored and within target range.. Temperature is normal and within the target range for the patient.Marland Kitchen Appears in no distress. Vitals Time Taken: 9:46 AM, Height: 62 in, Weight: 206 lbs, BMI: 37.7, Temperature: 98.3 F, Pulse: 99 bpm, Respiratory Rate: 18 breaths/min, Blood Pressure: 128/82 mmHg. General Notes: Wound  exam; right anterior lower leg. There is 2 areas one is still got some depth the other is superficial user which is close by. Both wounds required debridement with a #5 curette of adherent debris on the surface hemostasis with direct pressure I am able to get this down to a healthy looking wound ooThe periwound skin looks considerably better. Much less irritated Integumentary (Hair, Skin) Wound #1 status is Open. Original cause of wound was Trauma. The wound is located on the Right,Anterior Lower Leg. The wound measures 1.9cm length x 1.1cm width x 0.2cm depth; 1.641cm^2 area and 0.328cm^3 volume. There is Fat Layer (Subcutaneous Tissue) exposed. There is no tunneling or undermining noted. There is a medium amount of serosanguineous drainage noted. The wound margin is distinct with the outline attached to the wound base. There is medium (34-66%) red granulation within the wound bed. There is a medium (34-66%) amount of necrotic tissue within the wound bed including Adherent Slough. Wound #2 status is Open. Original cause of wound was Trauma. The wound is located on the Right,Medial Lower Leg. The wound measures 0.4cm length x 0.4cm width x 0.1cm depth; 0.126cm^2 area and 0.013cm^3 volume. There is Fat Layer (Subcutaneous Tissue) exposed. There is no tunneling or undermining noted. There is a small amount of serosanguineous drainage noted. The wound margin is distinct with the outline attached to the wound base. There is small (1- 33%) red, pink granulation within the wound bed. There is a small (1-33%) amount of necrotic tissue within the wound bed. General Notes: scabbed Assessment Active Problems ICD-10 Laceration without foreign body, right lower leg, sequela Non-pressure chronic ulcer of other part of right lower leg with other specified severity Chronic venous hypertension (idiopathic) with inflammation of right lower extremity Procedures Wound #1 Pre-procedure diagnosis of Wound #1 is an  Inflammatory located on the Right,Anterior Lower Leg . There was a Excisional Skin/Subcutaneous Tissue Debridement with a total area of 2.09 sq cm performed by Ricard Dillon., MD. With the following instrument(s): Curette to remove Viable and Non-Viable tissue/material. Material removed includes Subcutaneous Tissue, Slough, and Skin: Epidermis after achieving pain control using Other (Benzocaine 20%). No specimens were taken. A time out was conducted at 10:06, prior to the start of the procedure. A Minimum amount of bleeding was controlled with Pressure. The procedure was tolerated well  with a pain level of 0 throughout and a pain level of 0 following the procedure. Post Debridement Measurements: 1.9cm length x 1.1cm width x 0.2cm depth; 0.328cm^3 volume. Character of Wound/Ulcer Post Debridement is improved. Post procedure Diagnosis Wound #1: Same as Pre-Procedure Wound #2 Pre-procedure diagnosis of Wound #2 is a Skin T located on the Right,Medial Lower Leg . There was a Excisional Skin/Subcutaneous Tissue Debridement ear with a total area of 0.16 sq cm performed by Ricard Dillon., MD. With the following instrument(s): Curette to remove Viable and Non-Viable tissue/material. Material removed includes Subcutaneous Tissue, Slough, and Skin: Epidermis after achieving pain control using Other (Benzocaine 20%). No specimens were taken. A time out was conducted at 10:06, prior to the start of the procedure. A Minimum amount of bleeding was controlled with Pressure. The procedure was tolerated well with a pain level of 0 throughout and a pain level of 0 following the procedure. Post Debridement Measurements: 0.4cm length x 0.4cm width x 0.1cm depth; 0.013cm^3 volume. Character of Wound/Ulcer Post Debridement is improved. Post procedure Diagnosis Wound #2: Same as Pre-Procedure Plan Follow-up Appointments: Return Appointment in 2 weeks. Dressing Change Frequency: Wound #1 Right,Anterior Lower  Leg: Change dressing every day. Wound #2 Right,Medial Lower Leg: Change dressing every day. Skin Barriers/Peri-Wound Care: Antifungal cream - mixed with Triamcinolone to red/inflamed area TCA Cream or Ointment - mixed with antifungal to red/inflamed area Wound Cleansing: May shower and wash wound with soap and water. - scrub gently with gauze or clean wash cloth at the end of your shower Primary Wound Dressing: Wound #1 Right,Anterior Lower Leg: Iodoflex Wound #2 Right,Medial Lower Leg: Iodoflex Secondary Dressing: Wound #1 Right,Anterior Lower Leg: Kerlix/Rolled Gauze Dry Gauze Other: - may use ace wrap or coban to secure Wound #2 Right,Medial Lower Leg: Kerlix/Rolled Gauze Dry Gauze Other: - may use ace wrap or coban to secure Edema Control: Avoid standing for long periods of time Elevate legs to the level of the heart or above for 30 minutes daily and/or when sitting, a frequency of: - throughout the day Exercise regularly 1. I will continue with the Iodoflex for another week patient is changing the dressing herself 2. Continue Lotrisone to the periwound 3. I told her I think she can stop the oral antifungal. 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 Unfortunately I cannot really tell her which component of the Medihoney was the culprit Electronic Signature(s) Signed: 07/08/2020 5:37:06 PM By: Linton Ham MD Entered By: Linton Ham on 07/08/2020 10:13:44 -------------------------------------------------------------------------------- SuperBill Details Patient Name: Date of Service: [name], [name] D. 07/08/2020 Medical Record Number: [id number] Patient Account Number: [id number] Date of Birth/Sex: Treating RN: 1961-05-11 (59 y.o. Nancy Fetter Primary Care Provider: [employer] Other Clinician: Referring Provider: Treating Provider/Extender: Garfield Cornea in Treatment: 4 Diagnosis  Coding ICD-10 Codes Code Description [phone] Laceration without foreign body, right lower leg, sequela L97.818 Non-pressure chronic ulcer of other part of right lower leg with other specified severity I87.321 Chronic venous hypertension (idiopathic) with inflammation of right lower extremity Facility Procedures CPT4 Code: [id number] Description: 29924 - DEB SUBQ TISSUE 20 SQ CM/< ICD-10 Diagnosis Description L97.818 Non-pressure chronic ulcer of other part of right lower leg with other specified I87.321 Chronic venous hypertension (idiopathic) with inflammation of right lower extrem Modifier: severity ity Quantity: 1 Physician Procedures : CPT4 Code Description Modifier [id number] - WC PHYS SUBQ TISS 20 SQ CM ICD-10 Diagnosis Description L97.818 Non-pressure chronic ulcer  of other part of right lower leg with other specified severity I87.321 Chronic venous hypertension (idiopathic)  with inflammation of right lower extremity Quantity: 1 Electronic Signature(s) Signed: 07/08/2020 5:37:06 PM By: Linton Ham MD Entered By: Linton Ham on 07/08/2020 10:14:00

## 2020-07-22 ENCOUNTER — Other Ambulatory Visit: Payer: Self-pay

## 2020-07-22 ENCOUNTER — Encounter (HOSPITAL_BASED_OUTPATIENT_CLINIC_OR_DEPARTMENT_OTHER): Payer: PRIVATE HEALTH INSURANCE | Admitting: Internal Medicine

## 2020-07-22 DIAGNOSIS — I87331 Chronic venous hypertension (idiopathic) with ulcer and inflammation of right lower extremity: Secondary | ICD-10-CM | POA: Diagnosis not present

## 2020-07-23 NOTE — Progress Notes (Signed)
[name], BLADES ([id number]) Visit Report for 07/22/2020 Debridement Details Patient Name: Date of Service: [name], [name] 07/22/2020 9:30 A M Medical Record Number: [id number] Patient Account Number: [id number] Date of Birth/Sex: Treating RN: 07/06/1961 (60 y.o. [name] Primary Care Provider: [employer] Other Clinician: Referring Provider: Treating Provider/Extender: Garfield Cornea in Treatment: 6 Debridement Performed for Assessment: Wound #1 Right,Anterior Lower Leg Performed By: Physician Ricard Dillon., MD Debridement Type: Debridement Level of Consciousness (Pre-procedure): Awake and Alert Pre-procedure Verification/Time Out Yes - 10:20 Taken: Start Time: 10:20 T Area Debrided (L x W): otal 1.3 (cm) x 1.1 (cm) = 1.43 (cm) Tissue and other material debrided: Viable, Non-Viable, Slough, Subcutaneous, Slough Level: Skin/Subcutaneous Tissue Debridement Description: Excisional Instrument: Curette Bleeding: Minimum Hemostasis Achieved: Pressure End Time: 10:21 Procedural Pain: 0 Post Procedural Pain: 0 Response to Treatment: Procedure was tolerated well Level of Consciousness (Post- Awake and Alert procedure): Post Debridement Measurements of Total Wound Length: (cm) 1.3 Width: (cm) 1.1 Depth: (cm) 0.3 Volume: (cm) 0.337 Character of Wound/Ulcer Post Debridement: Improved Post Procedure Diagnosis Same as Pre-procedure Electronic Signature(s) Signed: 07/22/2020 5:39:18 PM By: Linton Ham MD Signed: 07/22/2020 5:51:53 PM By: Levan Hurst RN, BSN Entered By: Linton Ham on 07/22/2020 10:39:16 -------------------------------------------------------------------------------- HPI Details Patient Name: Date of Service: [name], [name] D. 07/22/2020 9:30 A M Medical Record Number: [id number] Patient Account Number: [id number] Date of Birth/Sex: Treating RN: December 04, 1960 (59 y.o. [name] Primary Care  Provider: [employer] Other Clinician: Referring Provider: Treating Provider/Extender: Garfield Cornea in Treatment: 6 History of Present Illness HPI Description: ADMISSION 06/10/2020 This is a patient who suffered a dog scratch injury I believe sometime in August. She was seen in an urgent care on 05/09/2020 she was given doxycycline for secondary cellulitis noting that she had already been on a course of Augmentin. She was told to treat the wound with topical antibiotics. She followed up in urgent care at which time the wound was very pruritic. She was given a course of linezolid. Subsequently the patient got a course itraconazole orally from her primary doctor for rash around the wound and the pruritus. This seems to have helped remarkably according to the patient. In any case she comes here using Medihoney with a adhesive dressing. It turns out that she was seen at the [facility] clinic for 6 visits or so in August and September 2019 at that point she had an area on her posterior calf on the right. She comes in with a sizable list of demands including no mechanical debridement. She initially said that she was not sure she really needed to be here however I had a quick look at the wound and assured her that she did need to be here. Past medical history; lumbar radiculopathy, fifth metacarpal fracture, cellulitis of the left lower extremity ABI in this clinic was 1.06 on the right 10/18; 2-week follow-up. Patient is been using Medihoney. Her big complaint continues to be the extremely pruritic rash around the wounds. She has the 2 open areas. She has a small area medially that she scratched. She is quite convinced that she had no prior history of the skin issue in this area. She is on a systemic antifungal 11/1 2-week follow-up. I stopped the Medihoney last time and we have been using Iodoflex.And the pruritus in general around the wound resolved almost immediately. I  also gave her some Lotrisone. She had previously been put on oral antifungals by  her primary doctor. She been on this for a month I have told her I do not think that is necessary. 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 Her wounds are better. We are using Iodoflex 11/15; 2-week follow-up. Her wound actually looks a lot better. Wound circumference has essentially healed. Still some callus around the wound and debris on the surface we used Iodoflex for the last 2 weeks I am going to change her to silver collagen to the Electronic Signature(s) Signed: 07/22/2020 5:39:18 PM By: Linton Ham MD Entered By: Linton Ham on 07/22/2020 10:40:12 -------------------------------------------------------------------------------- Physical Exam Details Patient Name: Date of Service: [name], [name] D. 07/22/2020 9:30 A M Medical Record Number: [id number] Patient Account Number: [id number] Date of Birth/Sex: Treating RN: [birth year] (59 y.o. [name] Primary Care Provider: [employer] Other Clinician: Referring Provider: Treating Provider/Extender: Garfield Cornea in Treatment: 6 Constitutional Sitting or standing Blood Pressure is within target range for patient.. Pulse regular and within target range for patient.Marland Kitchen Respirations regular, non-labored and within target range.. Temperature is normal and within the target range for the patient.Marland Kitchen Appears in no distress. Notes Wound exam; right anterior lower leg. Part of this is healed. The remaining part of the wound has some depth. I removed thick skin and callus from around the wound margins and some gritty fibrinous debris from the surface hemostasis with direct pressure everything things up quite nicely Electronic Signature(s) Signed: 07/22/2020 5:39:18 PM By: Linton Ham MD Entered By: Linton Ham on 07/22/2020  10:41:07 -------------------------------------------------------------------------------- Physician Orders Details Patient Name: Date of Service: [name], [name] D. 07/22/2020 9:30 A M Medical Record Number: [id number] Patient Account Number: [id number] Date of Birth/Sex: Treating RN: April 15, 1961 (59 y.o. [name] Primary Care Provider: [employer] Other Clinician: Referring Provider: Treating Provider/Extender: Garfield Cornea in Treatment: 6 Verbal / Phone Orders: No Diagnosis Coding ICD-10 Coding Code Description [id number] Laceration without foreign body, right lower leg, sequela L97.818 Non-pressure chronic ulcer of other part of right lower leg with other specified severity I87.321 Chronic venous hypertension (idiopathic) with inflammation of right lower extremity Follow-up Appointments Return Appointment in 2 weeks. Dressing Change Frequency Wound #1 Right,Anterior Lower Leg Change Dressing every other day. Wound Cleansing May shower and wash wound with soap and water. - scrub gently with gauze or clean wash cloth at the end of your shower Primary Wound Dressing Wound #1 Right,Anterior Lower Leg Silver Collagen - moisten with hydrogel or KY jelly Secondary Dressing Wound #1 Right,Anterior Lower Leg Foam Border - or large bandaid Edema Control Avoid standing for long periods of time Elevate legs to the level of the heart or above for 30 minutes daily and/or when sitting, a frequency of: - throughout the day Exercise regularly Electronic Signature(s) Signed: 07/22/2020 5:39:18 PM By: Linton Ham MD Signed: 07/22/2020 5:51:53 PM By: Levan Hurst RN, BSN Entered By: Levan Hurst on 07/22/2020 10:23:17 -------------------------------------------------------------------------------- Problem List Details Patient Name: Date of Service: [name], [name] D. 07/22/2020 9:30 A M Medical Record Number: [id number] Patient Account Number:  [id number] Date of Birth/Sex: Treating RN: 22-Jun-1961 (59 y.o. [name] Primary Care Provider: [employer] Other Clinician: Referring Provider: Treating Provider/Extender: Garfield Cornea in Treatment: 6 Active Problems ICD-10 Encounter Code Description Active Date MDM Diagnosis S81.811S Laceration without foreign body, right lower leg, sequela 06/10/2020 No Yes L97.818 Non-pressure chronic ulcer of other part of right lower  leg with other specified 06/10/2020 No Yes severity I87.321 Chronic venous hypertension (idiopathic) with inflammation of right lower 06/10/2020 No Yes extremity Inactive Problems Resolved Problems Electronic Signature(s) Signed: 07/22/2020 5:39:18 PM By: Linton Ham MD Entered By: Linton Ham on 07/22/2020 10:39:02 -------------------------------------------------------------------------------- Progress Note Details Patient Name: Date of Service: [name], [name] D. 07/22/2020 9:30 A M Medical Record Number: [id number] Patient Account Number: [id number] Date of Birth/Sex: Treating RN: 1961/09/03 (59 y.o. [name] Primary Care Provider: [employer] Other Clinician: Referring Provider: Treating Provider/Extender: Garfield Cornea in Treatment: 6 Subjective History of Present Illness (HPI) ADMISSION 06/10/2020 This is a patient who suffered a dog scratch injury I believe sometime in August. She was seen in an urgent care on 05/09/2020 she was given doxycycline for secondary cellulitis noting that she had already been on a course of Augmentin. She was told to treat the wound with topical antibiotics. She followed up in urgent care at which time the wound was very pruritic. She was given a course of linezolid. Subsequently the patient got a course itraconazole orally from her primary doctor for rash around the wound and the pruritus. This seems to have helped remarkably according to the  patient. In any case she comes here using Medihoney with a adhesive dressing. It turns out that she was seen at the [facility] clinic for 6 visits or so in August and September 2019 at that point she had an area on her posterior calf on the right. She comes in with a sizable list of demands including no mechanical debridement. She initially said that she was not sure she really needed to be here however I had a quick look at the wound and assured her that she did need to be here. Past medical history; lumbar radiculopathy, fifth metacarpal fracture, cellulitis of the left lower extremity ABI in this clinic was 1.06 on the right 10/18; 2-week follow-up. Patient is been using Medihoney. Her big complaint continues to be the extremely pruritic rash around the wounds. She has the 2 open areas. She has a small area medially that she scratched. She is quite convinced that she had no prior history of the skin issue in this area. She is on a systemic antifungal 11/1 2-week follow-up. I stopped the Medihoney last time and we have been using Iodoflex.ooAnd the pruritus in general around the wound resolved almost immediately. I also gave her some Lotrisone. She had previously been put on oral antifungals by her primary doctor. She been on this for a month I have told her I do not think that is necessary. 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 Her wounds are better. We are using Iodoflex 11/15; 2-week follow-up. Her wound actually looks a lot better. Wound circumference has essentially healed. Still some callus around the wound and debris on the surface we used Iodoflex for the last 2 weeks I am going to change her to silver collagen to the Objective Constitutional Sitting or standing Blood Pressure is within target range for patient.. Pulse regular and within target range for patient.Marland Kitchen Respirations regular, non-labored and within target range..  Temperature is normal and within the target range for the patient.Marland Kitchen Appears in no distress. Vitals Time Taken: 9:33 AM, Height: 62 in, Weight: 206 lbs, BMI: 37.7, Temperature: 98.9 F, Pulse: 96 bpm, Respiratory Rate: 18 breaths/min, Blood Pressure: 123/79 mmHg. General Notes: Wound exam; right anterior lower leg. Part of this is healed.  The remaining part of the wound has some depth. I removed thick skin and callus from around the wound margins and some gritty fibrinous debris from the surface hemostasis with direct pressure everything things up quite nicely Integumentary (Hair, Skin) Wound #1 status is Open. Original cause of wound was Trauma. The wound is located on the Right,Anterior Lower Leg. The wound measures 1.3cm length x 1.1cm width x 0.3cm depth; 1.123cm^2 area and 0.337cm^3 volume. There is Fat Layer (Subcutaneous Tissue) exposed. There is no tunneling or undermining noted. There is a medium amount of serosanguineous drainage noted. The wound margin is distinct with the outline attached to the wound base. There is small (1-33%) red granulation within the wound bed. There is a large (67-100%) amount of necrotic tissue within the wound bed including Adherent Slough. Wound #2 status is Healed - Epithelialized. Original cause of wound was Trauma. The wound is located on the Right,Medial Lower Leg. The wound measures 0cm length x 0cm width x 0cm depth; 0cm^2 area and 0cm^3 volume. There is no tunneling or undermining noted. There is a none present amount of drainage noted. The wound margin is distinct with the outline attached to the wound base. There is no granulation within the wound bed. There is no necrotic tissue within the wound bed. Assessment Active Problems ICD-10 Laceration without foreign body, right lower leg, sequela Non-pressure chronic ulcer of other part of right lower leg with other specified severity Chronic venous hypertension (idiopathic) with inflammation of right  lower extremity Procedures Wound #1 Pre-procedure diagnosis of Wound #1 is an Inflammatory located on the Right,Anterior Lower Leg . There was a Excisional Skin/Subcutaneous Tissue Debridement with a total area of 1.43 sq cm performed by Ricard Dillon., MD. With the following instrument(s): Curette to remove Viable and Non-Viable tissue/material. Material removed includes Subcutaneous Tissue and Slough and. No specimens were taken. A time out was conducted at 10:20, prior to the start of the procedure. A Minimum amount of bleeding was controlled with Pressure. The procedure was tolerated well with a pain level of 0 throughout and a pain level of 0 following the procedure. Post Debridement Measurements: 1.3cm length x 1.1cm width x 0.3cm depth; 0.337cm^3 volume. Character of Wound/Ulcer Post Debridement is improved. Post procedure Diagnosis Wound #1: Same as Pre-Procedure Plan Follow-up Appointments: Return Appointment in 2 weeks. Dressing Change Frequency: Wound #1 Right,Anterior Lower Leg: Change Dressing every other day. Wound Cleansing: May shower and wash wound with soap and water. - scrub gently with gauze or clean wash cloth at the end of your shower Primary Wound Dressing: Wound #1 Right,Anterior Lower Leg: Silver Collagen - moisten with hydrogel or KY jelly Secondary Dressing: Wound #1 Right,Anterior Lower Leg: Foam Border - or large bandaid Edema Control: Avoid standing for long periods of time Elevate legs to the level of the heart or above for 30 minutes daily and/or when sitting, a frequency of: - throughout the day Exercise regularly 1. The patient is doing quite nicely with the traumatic wound on her right anterior lower extremity. 2. I changed her to silver collagen with border foam change every 2 3. She originally had what I think was a chronic contact dermatitis related to [employer]) Signed: 07/22/2020 5:39:18 PM By: Linton Ham  MD Entered By: Linton Ham on 07/22/2020 10:42:04 -------------------------------------------------------------------------------- SuperBill Details Patient Name: Date of Service: [name], [name] D. 07/22/2020 Medical Record Number: [id number] Patient Account Number: [id number] Date of Birth/Sex: Treating RN: [birth year] (59 y.o. F) Donnal Debar,  [place] Primary Care Provider: [employer] Other Clinician: Referring Provider: Treating Provider/Extender: Garfield Cornea in Treatment: 6 Diagnosis Coding ICD-10 Codes Code Description [phone] Laceration without foreign body, right lower leg, sequela L97.818 Non-pressure chronic ulcer of other part of right lower leg with other specified severity I87.321 Chronic venous hypertension (idiopathic) with inflammation of right lower extremity Facility Procedures CPT4 Code: [id number] Description: 16837 - DEB SUBQ TISSUE 20 SQ CM/< ICD-10 Diagnosis Description L97.818 Non-pressure chronic ulcer of other part of right lower leg with other specified S81.811S Laceration without foreign body, right lower leg, sequela Modifier: severity Quantity: 1 Physician Procedures : CPT4 Code Description Modifier [id number] - WC PHYS SUBQ TISS 20 SQ CM ICD-10 Diagnosis Description L97.818 Non-pressure chronic ulcer of other part of right lower leg with other specified severity S81.811S Laceration without foreign body, right  lower leg, sequela Quantity: 1 Electronic Signature(s) Signed: 07/22/2020 5:39:18 PM By: Linton Ham MD Entered By: Linton Ham on 07/22/2020 10:42:25

## 2020-07-23 NOTE — Progress Notes (Signed)
Rachel, [name] ([id number]) Visit Report for 07/22/2020 Arrival Information Details Patient Name: Date of Service: [name], [name] 07/22/2020 9:30 A M Medical Record Number: [id number] Patient Account Number: [id number] Date of Birth/Sex: Treating RN: 1961/04/20 (59 y.o. Orvan Falconer Primary Care Jenese Mischke: [employer] Other Clinician: Referring Gordie Crumby: Treating Shalandra Leu/Extender: Garfield Cornea in Treatment: 6 Visit Information History Since Last Visit All ordered tests and consults were completed: No Patient Arrived: Ambulatory Added or deleted any medications: No Arrival Time: 09:33 Any new allergies or adverse reactions: No Accompanied By: self Had a fall or experienced change in No Transfer Assistance: None activities of daily living that may affect Patient Identification Verified: Yes risk of falls: Secondary Verification Process Completed: Yes Signs or symptoms of abuse/neglect since last visito No Patient Requires Transmission-Based Precautions: No Hospitalized since last visit: No Patient Has Alerts: No Implantable device outside of the clinic excluding No cellular tissue based products placed in the center since last visit: Has Dressing in Place as Prescribed: Yes Has Compression in Place as Prescribed: Yes Pain Present Now: No Electronic Signature(s) Signed: 07/22/2020 5:50:45 PM By: Carlene Coria RN Entered By: Carlene Coria on 07/22/2020 09:33:23 -------------------------------------------------------------------------------- Encounter Discharge Information Details Patient Name: Date of Service: [name], Rachel Roys D. 07/22/2020 9:30 A M Medical Record Number: [id number] Patient Account Number: [id number] Date of Birth/Sex: Treating RN: [birth year] (59 y.o. Orvan Falconer Primary Care Cate Oravec: [employer] Other Clinician: Referring Kadyn Guild: Treating Jonee Lamore/Extender: Garfield Cornea in  Treatment: 6 Encounter Discharge Information Items Post Procedure Vitals Discharge Condition: Stable Temperature (F): 98.9 Ambulatory Status: Ambulatory Pulse (bpm): 96 Discharge Destination: Home Respiratory Rate (breaths/min): 18 Transportation: Private Auto Blood Pressure (mmHg): 123/79 Accompanied By: self Schedule Follow-up Appointment: Yes Clinical Summary of Care: Patient Declined Electronic Signature(s) Signed: 07/22/2020 5:50:45 PM By: Carlene Coria RN Entered By: Carlene Coria on 07/22/2020 10:33:29 -------------------------------------------------------------------------------- Lower Extremity Assessment Details Patient Name: Date of Service: Rachel, [name] D. 07/22/2020 9:30 A M Medical Record Number: [id number] Patient Account Number: [id number] Date of Birth/Sex: Treating RN: 15-Jul-1961 (59 y.o. Orvan Falconer Primary Care Issak Goley: [employer] Other Clinician: Referring Serjio Deupree: Treating Rease Wence/Extender: Garfield Cornea in Treatment: 6 Edema Assessment Assessed: [Left: No] [Right: No] Edema: [Left: Ye] [Right: s] Calf Left: Right: Point of Measurement: 39 cm From Medial Instep 33 cm Ankle Left: Right: Point of Measurement: 10 cm From Medial Instep 24 cm Electronic Signature(s) Signed: 07/22/2020 5:50:45 PM By: Carlene Coria RN Entered By: Carlene Coria on 07/22/2020 09:34:12 -------------------------------------------------------------------------------- Multi Wound Chart Details Patient Name: Date of Service: [name], Rachel Roys D. 07/22/2020 9:30 A M Medical Record Number: [id number] Patient Account Number: [id number] Date of Birth/Sex: Treating RN: Jan 17, 1961 (59 y.o. Nancy Fetter Primary Care Livi Mcgann: [employer] Other Clinician: Referring Mayjor Ager: Treating Lyza Houseworth/Extender: Garfield Cornea in Treatment: 6 Vital Signs Height(in): 62 Pulse(bpm): 96 Weight(lbs): 206 Blood  Pressure(mmHg): 123/79 Body Mass Index(BMI): 38 Temperature(F): 98.9 Respiratory Rate(breaths/min): 18 Photos: [1:No Photos Right, Anterior Lower Leg] [2:No Photos Right, Medial Lower Leg] [N/A:N/A N/A] Wound Location: [1:Trauma] [2:Trauma] [N/A:N/A] Wounding Event: [1:Inflammatory] [2:Skin Tear] [N/A:N/A] Primary Etiology: [1:04/07/2020] [2:06/20/2020] [N/A:N/A] Date Acquired: [1:6] [2:4] [N/A:N/A] Weeks of Treatment: [1:Open] [2:Healed - Epithelialized] [N/A:N/A] Wound Status: [1:1.3x1.1x0.3] [2:0x0x0] [N/A:N/A] Measurements L x W x D (cm) [1:1.123] [2:0] [N/A:N/A] A (cm) : rea [1:0.337] [2:0] [N/A:N/A] Volume (cm) : [1:92.10%] [2:100.00%] [N/A:N/A] % Reduction in A rea: [1:76.20%] [2:100.00%] [N/A:N/A] % Reduction in  Volume: [1:Full Thickness Without Exposed] [2:Full Thickness Without Exposed] [N/A:N/A] Classification: [1:Support Structures Medium] [2:Support Structures None Present] [N/A:N/A] Exudate Amount: [1:Serosanguineous] [2:N/A] [N/A:N/A] Exudate Type: [1:red, brown] [2:N/A] [N/A:N/A] Exudate Color: [1:Distinct, outline attached] [2:Distinct, outline attached] [N/A:N/A] Wound Margin: [1:Small (1-33%)] [2:None Present (0%)] [N/A:N/A] Granulation Amount: [1:Red] [2:N/A] [N/A:N/A] Granulation Quality: [1:Large (67-100%)] [2:None Present (0%)] [N/A:N/A] Necrotic Amount: [1:Fat Layer (Subcutaneous Tissue): Yes Fascia: No] [N/A:N/A] Exposed Structures: [1:Fascia: No Tendon: No Muscle: No Joint: No Bone: No Small (1-33%)] [2:Fat Layer (Subcutaneous Tissue): No Tendon: No Muscle: No Joint: No Bone: No Large (67-100%)] [N/A:N/A] Epithelialization: [1:Debridement - Excisional] [2:N/A] [N/A:N/A] Debridement: Pre-procedure Verification/Time Out 10:20 [2:N/A] [N/A:N/A] Taken: [1:Subcutaneous, Slough] [2:N/A] [N/A:N/A] Tissue Debrided: [1:Skin/Subcutaneous Tissue] [2:N/A] [N/A:N/A] Level: [1:1.43] [2:N/A] [N/A:N/A] Debridement A (sq cm): [1:rea Curette] [2:N/A] [N/A:N/A] Instrument:  [1:Minimum] [2:N/A] [N/A:N/A] Bleeding: [1:Pressure] [2:N/A] [N/A:N/A] Hemostasis A chieved: [1:0] [2:N/A] [N/A:N/A] Procedural Pain: [1:0] [2:N/A] [N/A:N/A] Post Procedural Pain: [1:Procedure was tolerated well] [2:N/A] [N/A:N/A] Debridement Treatment Response: [1:1.3x1.1x0.3] [2:N/A] [N/A:N/A] Post Debridement Measurements L x W x D (cm) [1:0.337] [2:N/A] [N/A:N/A] Post Debridement Volume: (cm) [1:Debridement] [2:N/A] [N/A:N/A] Treatment Notes Wound #1 (Right, Anterior Lower Leg) 1. Cleanse With Wound Cleanser 3. Primary Dressing Applied Col[employer] Other primary dressing (specifiy in notes) 4. Secondary Dressing Foam Border Dressing Notes moisten collagen with hydrogel Electronic Signature(s) Signed: 07/22/2020 5:39:18 PM By: Linton Ham MD Signed: 07/22/2020 5:51:53 PM By: Levan Hurst RN, BSN Entered By: Linton Ham on 07/22/2020 10:39:08 -------------------------------------------------------------------------------- Multi-Disciplinary Care Plan Details Patient Name: Date of Service: [name], Rachel Roys D. 07/22/2020 9:30 A M Medical Record Number: [id number] Patient Account Number: [id number] Date of Birth/Sex: Treating RN: 13-Mar-1961 (59 y.o. Nancy Fetter Primary Care Jeraldean Wechter: [employer] Other Clinician: Referring Embry Huss: Treating Edilberto Roosevelt/Extender: Garfield Cornea in Treatment: 6 Active Inactive Wound/Skin Impairment Nursing Diagnoses: Impaired tissue integrity Knowledge deficit related to ulceration/compromised skin integrity Goals: Patient/caregiver will verbalize understanding of skin care regimen Date Initiated: 06/10/2020 Target Resolution Date: 08/09/2020 Goal Status: Active Ulcer/skin breakdown will have a volume reduction of 30% by week 4 Date Initiated: 06/10/2020 Date Inactivated: 07/08/2020 Target Resolution Date: 07/08/2020 Goal Status: Met Ulcer/skin breakdown will have a volume reduction of 50% by week  8 Date Initiated: 07/08/2020 Target Resolution Date: 08/09/2020 Goal Status: Active Interventions: Assess patient/caregiver ability to obtain necessary supplies Assess patient/caregiver ability to perform ulcer/skin care regimen upon admission and as needed Assess ulceration(s) every visit Provide education on ulcer and skin care Treatment Activities: Skin care regimen initiated : 06/10/2020 Topical wound management initiated : 06/10/2020 Notes: Electronic Signature(s) Signed: 07/22/2020 5:51:53 PM By: Levan Hurst RN, BSN Entered By: Levan Hurst on 07/22/2020 11:32:40 -------------------------------------------------------------------------------- Pain Assessment Details Patient Name: Date of Service: [name], Rachel Roys D. 07/22/2020 9:30 A M Medical Record Number: [id number] Patient Account Number: [id number] Date of Birth/Sex: Treating RN: 1961-03-01 (59 y.o. Orvan Falconer Primary Care Laquashia Mergenthaler: [employer] Other Clinician: Referring Daisy Lites: Treating Der Gagliano/Extender: Garfield Cornea in Treatment: 6 Active Problems Location of Pain Severity and Description of Pain Patient Has Paino No Site Locations Pain Management and Medication Current Pain Management: Electronic Signature(s) Signed: 07/22/2020 5:50:45 PM By: Carlene Coria RN Entered By: Carlene Coria on 07/22/2020 09:34:02 -------------------------------------------------------------------------------- Patient/Caregiver Education Details Patient Name: Date of Service: Estelle June D. 11/15/2021andnbsp9:30 [place] Record Number: [id number] Patient Account Number: [id number] Date of Birth/Gender: Treating RN: 24-Dec-1960 (59 y.o. Nancy Fetter Primary Care Physician: [employer] Other Clinician: Referring Physician: Treating Physician/Extender:  Garfield Cornea in Treatment: 6 Education Assessment Education Provided To: Patient Education Topics  Provided Wound/Skin Impairment: Methods: Explain/Verbal Responses: State content correctly [employer]) Signed: 07/22/2020 5:51:53 PM By: Levan Hurst RN, BSN Entered By: Levan Hurst on 07/22/2020 12:34:00 -------------------------------------------------------------------------------- Wound Assessment Details Patient Name: Date of Service: [name], Rachel Roys D. 07/22/2020 9:30 A M Medical Record Number: [id number] Patient Account Number: [id number] Date of Birth/Sex: Treating RN: 1961/03/16 (59 y.o. Orvan Falconer Primary Care Michole Lecuyer: [employer] Other Clinician: Referring Adriel Kessen: Treating Miosotis Wetsel/Extender: Garfield Cornea in Treatment: 6 Wound Status Wound Number: 1 Primary Etiology: Inflammatory Wound Location: Right, Anterior Lower Leg Wound Status: Open Wounding Event: Trauma Date Acquired: 04/07/2020 Weeks Of Treatment: 6 Clustered Wound: No Wound Measurements Length: (cm) 1.3 Width: (cm) 1.1 Depth: (cm) 0.3 Area: (cm) 1.123 Volume: (cm) 0.337 % Reduction in Area: 92.1% % Reduction in Volume: 76.2% Epithelialization: Small (1-33%) Tunneling: No Undermining: No Wound Description Classification: Full Thickness Without Exposed Support Structu Wound Margin: Distinct, outline attached Exudate Amount: Medium Exudate Type: Serosanguineous Exudate Color: red, brown res Foul Odor After Cleansing: No Slough/Fibrino Yes Wound Bed Granulation Amount: Small (1-33%) Exposed Structure Granulation Quality: Red Fascia Exposed: No Necrotic Amount: Large (67-100%) Fat Layer (Subcutaneous Tissue) Exposed: Yes Necrotic Quality: Adherent Slough Tendon Exposed: No Muscle Exposed: No Joint Exposed: No Bone Exposed: No Treatment Notes Wound #1 (Right, Anterior Lower Leg) 1. Cleanse With Wound Cleanser 3. Primary Dressing Applied Collegen [employer] Other primary dressing (specifiy in notes) 4. Secondary Dressing Foam Border  Dressing Notes moisten collagen with hydrogel Electronic Signature(s) Signed: 07/22/2020 5:50:45 PM By: Carlene Coria RN Entered By: Carlene Coria on 07/22/2020 09:34:49 -------------------------------------------------------------------------------- Wound Assessment Details Patient Name: Date of Service: Estelle June D. 07/22/2020 9:30 A M Medical Record Number: [id number] Patient Account Number: [id number] Date of Birth/Sex: Treating RN: [birth year] (59 y.o. Orvan Falconer Primary Care Greysin Medlen: [employer] Other Clinician: Referring Charice Zuno: Treating Noah Pelaez/Extender: Garfield Cornea in Treatment: 6 Wound Status Wound Number: 2 Primary Etiology: Skin Tear Wound Location: Right, Medial Lower Leg Wound Status: Healed - Epithelialized Wounding Event: Trauma Date Acquired: 06/20/2020 Weeks Of Treatment: 4 Clustered Wound: No Wound Measurements Length: (cm) Width: (cm) Depth: (cm) Area: (cm) Volume: (cm) 0 % Reduction in Area: 100% 0 % Reduction in Volume: 100% 0 Epithelialization: Large (67-100%) 0 Tunneling: No 0 Undermining: No Wound Description Classification: Full Thickness Without Exposed Support Structures Wound Margin: Distinct, outline attached Exudate Amount: None Present Foul Odor After Cleansing: No Slough/Fibrino No Wound Bed Granulation Amount: None Present (0%) Exposed Structure Necrotic Amount: None Present (0%) Fascia Exposed: No Fat Layer (Subcutaneous Tissue) Exposed: No Tendon Exposed: No Muscle Exposed: No Joint Exposed: No Bone Exposed: No Electronic Signature(s) Signed: 07/22/2020 5:50:45 PM By: Carlene Coria RN Signed: 07/22/2020 5:51:53 PM By: Levan Hurst RN, BSN Entered By: Levan Hurst on 07/22/2020 10:21:30 -------------------------------------------------------------------------------- Vitals Details Patient Name: Date of Service: [name], Rachel Roys D. 07/22/2020 9:30 A M Medical Record  Number: [id number] Patient Account Number: [id number] Date of Birth/Sex: Treating RN: 15-May-1961 (59 y.o. Orvan Falconer Primary Care Telly Broberg: [employer] Other Clinician: Referring Niklaus Mamaril: Treating Dody Smartt/Extender: Garfield Cornea in Treatment: 6 Vital Signs Time Taken: 09:33 Temperature (F): 98.9 Height (in): 62 Pulse (bpm): 96 Weight (lbs): 206 Respiratory Rate (breaths/min): 18 Body Mass Index (BMI): 37.7 Blood Pressure (mmHg): 123/79 Reference Range: 80 - 120 mg / dl Electronic Signature(s) Signed: 07/22/2020 5:50:45 PM By:  Carlene Coria RN Entered By: Carlene Coria on 07/22/2020 09:33:56

## 2020-08-05 ENCOUNTER — Encounter (HOSPITAL_BASED_OUTPATIENT_CLINIC_OR_DEPARTMENT_OTHER): Payer: PRIVATE HEALTH INSURANCE | Admitting: Internal Medicine

## 2020-08-24 ENCOUNTER — Other Ambulatory Visit (HOSPITAL_COMMUNITY)
Admission: RE | Admit: 2020-08-24 | Discharge: 2020-08-24 | Disposition: A | Discharge status: Home / Self Care | Payer: PRIVATE HEALTH INSURANCE | Source: Ambulatory Visit | Attending: Obstetrics and Gynecology | Admitting: Obstetrics and Gynecology

## 2020-08-24 DIAGNOSIS — Z01812 Encounter for preprocedural laboratory examination: Secondary | ICD-10-CM | POA: Diagnosis present

## 2020-08-24 DIAGNOSIS — Z20822 Contact with and (suspected) exposure to covid-19: Secondary | ICD-10-CM | POA: Diagnosis not present

## 2020-08-24 LAB — SARS CORONAVIRUS 2 (TAT 6-24 HRS): SARS Coronavirus 2: NEGATIVE

## 2020-08-26 ENCOUNTER — Encounter (HOSPITAL_BASED_OUTPATIENT_CLINIC_OR_DEPARTMENT_OTHER): Payer: Self-pay | Admitting: Obstetrics and Gynecology

## 2020-08-26 ENCOUNTER — Other Ambulatory Visit: Payer: Self-pay | Admitting: Obstetrics and Gynecology

## 2020-08-26 ENCOUNTER — Other Ambulatory Visit: Payer: Self-pay

## 2020-08-26 NOTE — Progress Notes (Signed)
Spoke w/ via phone for pre-op interview--- PT Lab needs dos---- no (per anes)/  Pre-op orders pending              Lab results------ no COVID test ------ done 08-24-2020 negative results in epic Arrive at ------- 2111 NPO after MN NO Solid Food.  Clear liquids from MN until--- 0915 Medications to take morning of surgery ----- Estrogenmethltest, Provera, Tapament Diabetic medication ----- n/a Patient Special Instructions ----- n/a Pre-Op special Istructions ----- sent inbox message to dr Raliegh Ip. Ross in epic for orders Patient verbalized understanding of instructions that were given at this phone interview. Patient denies shortness of breath, chest pain, fever, cough at this phone interview.

## 2020-08-27 ENCOUNTER — Observation Stay (HOSPITAL_BASED_OUTPATIENT_CLINIC_OR_DEPARTMENT_OTHER)
Admission: RE | Admit: 2020-08-27 | Discharge: 2020-08-28 | Disposition: A | Discharge status: Home / Self Care | Payer: PRIVATE HEALTH INSURANCE | Attending: Obstetrics and Gynecology | Admitting: Obstetrics and Gynecology

## 2020-08-27 ENCOUNTER — Ambulatory Visit (HOSPITAL_BASED_OUTPATIENT_CLINIC_OR_DEPARTMENT_OTHER): Payer: PRIVATE HEALTH INSURANCE | Admitting: Anesthesiology

## 2020-08-27 ENCOUNTER — Other Ambulatory Visit: Payer: Self-pay

## 2020-08-27 ENCOUNTER — Encounter (HOSPITAL_BASED_OUTPATIENT_CLINIC_OR_DEPARTMENT_OTHER): Payer: Self-pay | Admitting: Obstetrics and Gynecology

## 2020-08-27 ENCOUNTER — Encounter (HOSPITAL_COMMUNITY)
Admission: RE | Disposition: A | Discharge status: Home / Self Care | Payer: Self-pay | Source: Home / Self Care | Attending: Obstetrics and Gynecology

## 2020-08-27 DIAGNOSIS — Z7901 Long term (current) use of anticoagulants: Secondary | ICD-10-CM | POA: Diagnosis not present

## 2020-08-27 DIAGNOSIS — I4891 Unspecified atrial fibrillation: Secondary | ICD-10-CM | POA: Diagnosis not present

## 2020-08-27 DIAGNOSIS — N950 Postmenopausal bleeding: Secondary | ICD-10-CM | POA: Diagnosis not present

## 2020-08-27 DIAGNOSIS — Z9889 Other specified postprocedural states: Secondary | ICD-10-CM | POA: Diagnosis not present

## 2020-08-27 DIAGNOSIS — N841 Polyp of cervix uteri: Secondary | ICD-10-CM | POA: Diagnosis not present

## 2020-08-27 DIAGNOSIS — I499 Cardiac arrhythmia, unspecified: Secondary | ICD-10-CM | POA: Diagnosis present

## 2020-08-27 HISTORY — DX: Personal history of vulvar dysplasia: Z87.412

## 2020-08-27 HISTORY — DX: Spondylosis without myelopathy or radiculopathy, lumbar region: M47.816

## 2020-08-27 HISTORY — PX: HYSTEROSCOPY WITH D & C: SHX1775

## 2020-08-27 HISTORY — DX: Unspecified osteoarthritis, unspecified site: M19.90

## 2020-08-27 HISTORY — DX: Personal history of other diseases of the musculoskeletal system and connective tissue: Z87.39

## 2020-08-27 HISTORY — DX: Presence of spectacles and contact lenses: Z97.3

## 2020-08-27 HISTORY — DX: Other intervertebral disc degeneration, lumbosacral region: M51.37

## 2020-08-27 HISTORY — DX: Postmenopausal bleeding: N95.0

## 2020-08-27 HISTORY — DX: Personal history of other diseases of the circulatory system: Z86.79

## 2020-08-27 LAB — CBC
HCT: 51.8 % — ABNORMAL HIGH (ref 36.0–46.0)
Hemoglobin: 17 g/dL — ABNORMAL HIGH (ref 12.0–15.0)
MCH: 32 pg (ref 26.0–34.0)
MCHC: 32.8 g/dL (ref 30.0–36.0)
MCV: 97.6 fL (ref 80.0–100.0)
Platelets: 280 10*3/uL (ref 150–400)
RBC: 5.31 MIL/uL — ABNORMAL HIGH (ref 3.87–5.11)
RDW: 13.6 % (ref 11.5–15.5)
WBC: 7.6 10*3/uL (ref 4.0–10.5)
nRBC: 0 % (ref 0.0–0.2)

## 2020-08-27 LAB — BASIC METABOLIC PANEL
Anion gap: 8 (ref 5–15)
BUN: 17 mg/dL (ref 6–20)
CO2: 26 mmol/L (ref 22–32)
Calcium: 8.9 mg/dL (ref 8.9–10.3)
Chloride: 100 mmol/L (ref 98–111)
Creatinine, Ser: 0.74 mg/dL (ref 0.44–1.00)
GFR, Estimated: >60 mL/min (ref >60)
Glucose, Bld: 352 mg/dL — ABNORMAL HIGH (ref 70–99)
Potassium: 4 mmol/L (ref 3.5–5.1)
Sodium: 134 mmol/L — ABNORMAL LOW (ref 135–145)

## 2020-08-27 LAB — TYPE AND SCREEN
ABO/RH(D): AB POS
Antibody Screen: NEGATIVE

## 2020-08-27 LAB — MAGNESIUM: Magnesium: 1.9 mg/dL (ref 1.7–2.4)

## 2020-08-27 LAB — TSH: TSH: 0.86 u[IU]/mL (ref 0.350–4.500)

## 2020-08-27 SURGERY — DILATATION AND CURETTAGE /HYSTEROSCOPY
Anesthesia: General | Site: Uterus

## 2020-08-27 MED ORDER — FENTANYL CITRATE (PF) 100 MCG/2ML IJ SOLN
25.0000 ug | INTRAMUSCULAR | Status: DC | PRN
  Administered 2020-08-27: 25 ug via INTRAVENOUS

## 2020-08-27 MED ORDER — DEXAMETHASONE SODIUM PHOSPHATE 10 MG/ML IJ SOLN
INTRAMUSCULAR | Status: AC
  Filled 2020-08-27: qty 1

## 2020-08-27 MED ORDER — DEXAMETHASONE SODIUM PHOSPHATE 10 MG/ML IJ SOLN
INTRAMUSCULAR | Status: DC | PRN
  Administered 2020-08-27: 10 mg via INTRAVENOUS

## 2020-08-27 MED ORDER — HYDROCODONE-ACETAMINOPHEN 5-325 MG PO TABS: 1.0000 | ORAL_TABLET | Freq: Four times a day (QID) | ORAL | 0 refills | Status: DC | PRN

## 2020-08-27 MED ORDER — SCOPOLAMINE 1 MG/3DAYS TD PT72
1.0000 | MEDICATED_PATCH | TRANSDERMAL | Status: DC
  Administered 2020-08-27: 1 via TRANSDERMAL

## 2020-08-27 MED ORDER — DILTIAZEM HCL-DEXTROSE 125-5 MG/125ML-% IV SOLN (PREMIX)
5.0000 mg/h | INTRAVENOUS | Status: DC
  Administered 2020-08-27: 5 mg/h via INTRAVENOUS
  Filled 2020-08-27: qty 125

## 2020-08-27 MED ORDER — KETOROLAC TROMETHAMINE 30 MG/ML IJ SOLN
INTRAMUSCULAR | Status: DC | PRN
  Administered 2020-08-27: 30 mg via INTRAVENOUS

## 2020-08-27 MED ORDER — KETOROLAC TROMETHAMINE 30 MG/ML IJ SOLN
INTRAMUSCULAR | Status: AC
  Filled 2020-08-27: qty 1

## 2020-08-27 MED ORDER — POVIDONE-IODINE 10 % EX SWAB: 2.0000 "application " | Freq: Once | CUTANEOUS | Status: DC

## 2020-08-27 MED ORDER — FAMOTIDINE 20 MG PO TABS
20.0000 mg | ORAL_TABLET | Freq: Two times a day (BID) | ORAL | Status: DC
  Administered 2020-08-28: 20 mg via ORAL
  Filled 2020-08-27: qty 1

## 2020-08-27 MED ORDER — FENTANYL CITRATE (PF) 100 MCG/2ML IJ SOLN
INTRAMUSCULAR | Status: DC | PRN
  Administered 2020-08-27 (×2): 50 ug via INTRAVENOUS

## 2020-08-27 MED ORDER — ONDANSETRON HCL 4 MG PO TABS: 4.0000 mg | ORAL_TABLET | Freq: Four times a day (QID) | ORAL | Status: DC | PRN

## 2020-08-27 MED ORDER — ACETAMINOPHEN 325 MG PO TABS: 325.0000 mg | ORAL_TABLET | Freq: Once | ORAL | Status: DC | PRN

## 2020-08-27 MED ORDER — TRAMADOL HCL 50 MG PO TABS
100.0000 mg | ORAL_TABLET | Freq: Four times a day (QID) | ORAL | Status: DC
  Administered 2020-08-27 – 2020-08-28 (×2): 100 mg via ORAL
  Filled 2020-08-27 (×2): qty 2

## 2020-08-27 MED ORDER — MEPERIDINE HCL 25 MG/ML IJ SOLN: 6.2500 mg | INTRAMUSCULAR | Status: DC | PRN

## 2020-08-27 MED ORDER — DILTIAZEM LOAD VIA INFUSION
5.0000 mg | Freq: Once | INTRAVENOUS | Status: AC
  Administered 2020-08-27: 5 mg via INTRAVENOUS
  Filled 2020-08-27: qty 5

## 2020-08-27 MED ORDER — LIDOCAINE 2% (20 MG/ML) 5 ML SYRINGE
INTRAMUSCULAR | Status: DC | PRN
  Administered 2020-08-27: 100 mg via INTRAVENOUS

## 2020-08-27 MED ORDER — PROPOFOL 10 MG/ML IV BOLUS
INTRAVENOUS | Status: DC | PRN
  Administered 2020-08-27: 150 mg via INTRAVENOUS

## 2020-08-27 MED ORDER — AMISULPRIDE (ANTIEMETIC) 5 MG/2ML IV SOLN: 10.0000 mg | Freq: Once | INTRAVENOUS | Status: DC | PRN

## 2020-08-27 MED ORDER — IBUPROFEN 800 MG PO TABS: 800.0000 mg | ORAL_TABLET | Freq: Three times a day (TID) | ORAL | 0 refills | Status: DC | PRN

## 2020-08-27 MED ORDER — MELOXICAM 15 MG PO TABS
15.0000 mg | ORAL_TABLET | Freq: Every day | ORAL | Status: DC
Start: 2020-08-28 — End: 2020-08-28
  Administered 2020-08-28: 15 mg via ORAL
  Filled 2020-08-27: qty 1

## 2020-08-27 MED ORDER — EST ESTROGENS-METHYLTEST 1.25-2.5 MG PO TABS
1.0000 | ORAL_TABLET | Freq: Every day | ORAL | Status: DC
  Administered 2020-08-28: 1 via ORAL

## 2020-08-27 MED ORDER — ONDANSETRON HCL 4 MG/2ML IJ SOLN
INTRAMUSCULAR | Status: DC | PRN
  Administered 2020-08-27: 4 mg via INTRAVENOUS

## 2020-08-27 MED ORDER — ACETAMINOPHEN 325 MG PO TABS: 650.0000 mg | ORAL_TABLET | ORAL | Status: DC | PRN

## 2020-08-27 MED ORDER — MEDROXYPROGESTERONE ACETATE 2.5 MG PO TABS
2.5000 mg | ORAL_TABLET | Freq: Every day | ORAL | Status: DC
  Administered 2020-08-28: 2.5 mg via ORAL
  Filled 2020-08-27: qty 1

## 2020-08-27 MED ORDER — MIDAZOLAM HCL 2 MG/2ML IJ SOLN
INTRAMUSCULAR | Status: AC
  Filled 2020-08-27: qty 2

## 2020-08-27 MED ORDER — PROPOFOL 10 MG/ML IV BOLUS
INTRAVENOUS | Status: AC
  Filled 2020-08-27: qty 40

## 2020-08-27 MED ORDER — LACTATED RINGERS IV SOLN: INTRAVENOUS | Status: DC

## 2020-08-27 MED ORDER — ONDANSETRON HCL 4 MG/2ML IJ SOLN
INTRAMUSCULAR | Status: AC
  Filled 2020-08-27: qty 2

## 2020-08-27 MED ORDER — MIDAZOLAM HCL 2 MG/2ML IJ SOLN
INTRAMUSCULAR | Status: DC | PRN
  Administered 2020-08-27: 2 mg via INTRAVENOUS

## 2020-08-27 MED ORDER — LIDOCAINE HCL (PF) 2 % IJ SOLN
INTRAMUSCULAR | Status: AC
  Filled 2020-08-27: qty 5

## 2020-08-27 MED ORDER — FENTANYL CITRATE (PF) 100 MCG/2ML IJ SOLN
INTRAMUSCULAR | Status: AC
  Filled 2020-08-27: qty 2

## 2020-08-27 MED ORDER — ACETAMINOPHEN 160 MG/5ML PO SOLN: 325.0000 mg | Freq: Once | ORAL | Status: DC | PRN

## 2020-08-27 MED ORDER — SCOPOLAMINE 1 MG/3DAYS TD PT72
MEDICATED_PATCH | TRANSDERMAL | Status: AC
  Filled 2020-08-27: qty 1

## 2020-08-27 MED ORDER — ACETAMINOPHEN 10 MG/ML IV SOLN
1000.0000 mg | Freq: Once | INTRAVENOUS | Status: DC | PRN
Start: 2020-08-27 — End: 2020-08-27

## 2020-08-27 MED ORDER — SENNOSIDES-DOCUSATE SODIUM 8.6-50 MG PO TABS: 1.0000 | ORAL_TABLET | Freq: Every evening | ORAL | Status: DC | PRN

## 2020-08-27 MED ORDER — OXYCODONE HCL 5 MG PO TABS
5.0000 mg | ORAL_TABLET | Freq: Once | ORAL | Status: DC | PRN
Start: 2020-08-27 — End: 2020-08-27

## 2020-08-27 MED ORDER — LIDOCAINE HCL 1 % IJ SOLN
INTRAMUSCULAR | Status: DC | PRN
  Administered 2020-08-27: 10 mL

## 2020-08-27 MED ORDER — OXYCODONE HCL 5 MG/5ML PO SOLN
5.0000 mg | Freq: Once | ORAL | Status: DC | PRN
Start: 2020-08-27 — End: 2020-08-27

## 2020-08-27 MED ORDER — ONDANSETRON HCL 4 MG/2ML IJ SOLN: 4.0000 mg | Freq: Four times a day (QID) | INTRAMUSCULAR | Status: DC | PRN

## 2020-08-27 MED ORDER — SODIUM CHLORIDE 0.9 % IR SOLN
Status: DC | PRN
  Administered 2020-08-27: 3000 mL

## 2020-08-27 MED ORDER — LACTATED RINGERS IV SOLN
INTRAVENOUS | Status: DC
  Administered 2020-08-27: 1000 mL via INTRAVENOUS

## 2020-08-27 SURGICAL SUPPLY — 18 items
BIPOLAR CUTTING LOOP 21FR (ELECTRODE)
CANISTER SUCT 3000ML PPV (MISCELLANEOUS) ×2 IMPLANT
CATH ROBINSON RED A/P 16FR (CATHETERS) ×2 IMPLANT
COVER WAND RF STERILE (DRAPES) ×2 IMPLANT
DILATOR CANAL MILEX (MISCELLANEOUS) IMPLANT
GLOVE BIO SURGEON STRL SZ7 (GLOVE) ×2 IMPLANT
GOWN STRL REUS W/TWL LRG LVL3 (GOWN DISPOSABLE) ×2 IMPLANT
IV NS IRRIG 3000ML ARTHROMATIC (IV SOLUTION) ×2 IMPLANT
KIT PROCEDURE FLUENT (KITS) ×2 IMPLANT
KIT TURNOVER CYSTO (KITS) ×2 IMPLANT
LOOP CUTTING BIPOLAR 21FR (ELECTRODE) IMPLANT
PACK VAGINAL MINOR WOMEN LF (CUSTOM PROCEDURE TRAY) ×2 IMPLANT
PAD OB MATERNITY 4.3X12.25 (PERSONAL CARE ITEMS) ×2 IMPLANT
PAD PREP 24X48 CUFFED NSTRL (MISCELLANEOUS) ×2 IMPLANT
SEAL CERVICAL OMNI LOK (ABLATOR) IMPLANT
SEAL ROD LENS SCOPE MYOSURE (ABLATOR) IMPLANT
TOWEL OR 17X26 10 PK STRL BLUE (TOWEL DISPOSABLE) ×2 IMPLANT
WATER STERILE IRR 500ML POUR (IV SOLUTION) ×2 IMPLANT

## 2020-08-27 NOTE — H&P (Signed)
[name] is an 59 y.o. female.  59 yo postmenopausal female presents for surgical evaluation of postmenopausal bleeding.  The patient has been taking hormone replacement therapy with estrogen and progesterone for several years.  She has had several episodes of postmenopausal bleeding.  These have been evaluated with endometrial biopsies which have all been benign endometrial tissue to this point.  However she continues to have bleeding, therefore the decision was made to proceed with a more definitive evaluation of the endometrial cavity.  Risks/benefits/alternatives of the procedure were discussed with the patient at length and she wishes to proceed.  Endo Bx 08/2020: benign fragments of inactive endometrium Endo Bx 08/2019: benign endometrium Endo bx: 06/2018: benign endometrim  No LMP recorded. Patient is postmenopausal.    Past Medical History:  Diagnosis Date  . Arthritis   . 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 DDD (degenerative disc disease), lumbar   . DDD (degenerative disc disease), lumbosacral   . GERD (gastroesophageal reflux disease)    Uses OTC medication as needed  . 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 History of mitral valve prolapse    per pt previous has seen cardiologist , dr gamble , yrs ago--- echo scanned in epic dated 04-22-2007 (scanned on dated 06-20-2010)  shows no evidence mvp, ef >55%, trace MR/TR and mild AV sclerosis but no stenosis or regurg.  Marland Kitchen History of vulvar dysplasia    VIN3  . Lumbar spondylosis   . OA (osteoarthritis)   . PMB (postmenopausal bleeding)   . Scoliosis   . Wears glasses     Past Surgical History:  Procedure Laterality Date  . ANTERIOR CERVICAL DECOMP/DISCECTOMY FUSION  11-25-2009  @[facility]    C4 --- C7  . ANTERIOR LAT LUMBAR FUSION Left 10/22/2016   Procedure: Anterior Lateral Lumbar Fusion - Lumbar two - lumbar three, with Lateral plate;  Surgeon: Kary Kos, MD;  Location: [facility];   Service: Neurosurgery;  Laterality: Left;  Anterior Lateral Lumbar Fusion - Lumbar two - lumbar three, with Lateral plate  . CARDIAC CATHETERIZATION  age 60  . CO2 LASER APPLICATION N/A 22/97/9892   Procedure: CO2 LASER APPLICATION;  Surgeon: Gus Height, MD;  Location: [facility];  Service: Gynecology;  Laterality: N/A;  . COLONOSCOPY W/ POLYPECTOMY    . LAPAROSCOPIC CHOLECYSTECTOMY  04/2006  . LEG SURGERY Right 1983   femoral artery repair from cardiac cath injury   . OPEN REDUCTION INTERNAL FIXATION (ORIF) METACARPAL Right 08/12/2017   Procedure: OPEN REDUCTION INTERNAL FIXATION (ORIF) 5th METACARPAL fracture;  Surgeon: Roseanne Kaufman, MD;  Location: [facility];  Service: Orthopedics;  Laterality: Right;  60 mins  . SPINAL FIXATION SURGERY  1973   age 27 - sclosis  . TOTAL HIP ARTHROPLASTY Right 06-16-2010 @[facility]   . UPPER GASTROINTESTINAL ENDOSCOPY    . VSD REPAIR  age 60  . VULVECTOMY N/A 09/18/2015   Procedure: WIDE EXCISION VULVECTOMY;  Surgeon: Vanessa Kick, MD;  Location: [facility];  Service: Gynecology;  Laterality: N/A;    Family History  Problem Relation Age of Onset  . Diabetes Other   . Heart disease Other   . COPD Mother   . Diabetes Father   . Pneumonia Father   . Alcoholism Father   . Colon cancer Neg Hx     Social History:  reports that she quit smoking about 17 years ago. Her smoking use included cigarettes. She has a 20.00  pack-year smoking history. She has never used smokeless tobacco. She reports that she does not drink alcohol and does not use drugs.  Allergies:  Allergies  Allergen Reactions  . Other Dermatitis    medahoney causes skin reaction looked like fungal infection    Medications Prior to Admission  Medication Sig Dispense Refill Last Dose  . Collagen-Vitamin C-Biotin (COLLAGEN 1500/C PO) Take by mouth.   08/26/2020 at Unknown time  . estrogens-methylTEST 1.25-2.5 MG TABS tablet Take 1 tablet by mouth daily.   08/27/2020 at Unknown time  . medroxyPROGESTERone  (PROVERA) 2.5 MG tablet Take 2.5 mg by mouth daily.   08/27/2020 at Unknown time  . meloxicam (MOBIC) 15 MG tablet Take 15 mg by mouth daily.   08/26/2020 at Unknown time  . OVER THE COUNTER MEDICATION 2 capsules daily. Zinc, vit C and Vit D   08/26/2020 at Unknown time  . traMADol (ULTRAM) 50 MG tablet Take 100 mg by mouth 4 (four) times daily. Only takes 2 in the morning.  Prescribed 1-2 every 4 hours as needed.   08/26/2020 at collagen  . Cimetidine (TAGAMET PO) Take 1 tablet by mouth daily as needed (indigestion).   More than a month at Unknown time  . HYDROcodone-acetaminophen (NORCO/VICODIN) 5-325 MG tablet Take 1-2 tablets by mouth every 6 (six) hours as needed. 6 tablet 0 More than a month at Unknown time    Review of Systems  Blood pressure (!) 149/88, pulse 89, temperature 97.6 F (36.4 C), temperature source Oral, resp. rate 18, height 5\' 2"  (1.575 m), weight 89.6 kg, SpO2 100 %. Physical Exam   AOx3, NAD Abd Soft Normal work of breathing  Results for orders placed or performed during the hospital encounter of 08/27/20 (from the past 24 hour(s))  Type and screen     Status: None   Collection Time: 08/27/20 10:55 AM  Result Value Ref Range   ABO/RH(D) AB POS    Antibody Screen NEG    Sample Expiration      08/30/2020,2359 Performed at [facility], [place] [address], [place], [place] [address]      Results for orders placed or performed during the hospital encounter of 08/27/20 (from the past 24 hour(s))  Type and screen     Status: None   Collection Time: 08/27/20 10:55 AM  Result Value Ref Range   ABO/RH(D) AB POS    Antibody Screen NEG    Sample Expiration      08/30/2020,2359 Performed at [facility], [place] [address], [place], [place] [address]     No results found.  Assessment/Plan: 1) Proceed with hysteroscopy, dilation and curettage for complete evaluation of the endometrial cavity 2) SCDs for DVT prophylaxis  Vanessa Kick 08/27/2020, 11:42 AM

## 2020-08-27 NOTE — Discharge Instructions (Signed)
  Post Anesthesia Home Care Instructions  Activity: Get plenty of rest for the remainder of the day. A responsible individual must stay with you for 24 hours following the procedure.  For the next 24 hours, DO NOT: -Drive a car -Paediatric nurse -Drink alcoholic beverages -Take any medication unless instructed by your physician -Make any legal decisions or sign important papers.  Meals: Start with liquid foods such as gelatin or soup. Progress to regular foods as tolerated. Avoid greasy, spicy, heavy foods. If nausea and/or vomiting occur, drink only clear liquids until the nausea and/or vomiting subsides. Call your physician if vomiting continues.  Special Instructions/Symptoms: Your throat may feel dry or sore from the anesthesia or the breathing tube placed in your throat during surgery. If this causes discomfort, gargle with warm salt water. The discomfort should disappear within 24 hours.  If you had a scopolamine patch placed behind your ear for the management of post- operative nausea and/or vomiting:  1. The medication in the patch is effective for 72 hours, after which it should be removed.  Wrap patch in a tissue and discard in the trash. Wash hands thoroughly with soap and water. 2. You may remove the patch earlier than 72 hours if you experience unpleasant side effects which may include dry mouth, dizziness or visual disturbances. 3. Avoid touching the patch. Wash your hands with soap and water after contact with the patch. Remove patch behind Right ear by Friday, December 24th.     DISCHARGE INSTRUCTIONS: D&C / D&E The following instructions have been prepared to help you care for yourself upon your return home.   Personal hygiene: Marland Kitchen Use sanitary pads for vaginal drainage, not tampons. . Shower the day after your procedure. . NO tub baths, pools or Jacuzzis for 2-3 weeks. . Wipe front to back after using the bathroom.  Activity and limitations: . Do NOT drive or  operate any equipment for 24 hours. The effects of anesthesia are still present and drowsiness may result. . Do NOT rest in bed all day. . Walking is encouraged. . Walk up and down stairs slowly. . You may resume your normal activity in one to two days or as indicated by your physician.  Sexual activity: NO intercourse for at least 2 weeks after the procedure, or as indicated by your physician.  Diet: Eat a light meal as desired this evening. You may resume your usual diet tomorrow.  Return to work: You may resume your work activities in one to two days or as indicated by your doctor.  What to expect after your surgery: Expect to have vaginal bleeding/discharge for 2-3 days and spotting for up to 10 days. It is not unusual to have soreness for up to 1-2 weeks. You may have a slight burning sensation when you urinate for the first day. Mild cramps may continue for a couple of days. You may have a regular period in 2-6 weeks.  Call your doctor for any of the following: . Excessive vaginal bleeding, saturating and changing one pad every hour. . Inability to urinate 6 hours after discharge from hospital. . Pain not relieved by pain medication. . Fever of 100.4 F or greater. . Unusual vaginal discharge or odor.

## 2020-08-27 NOTE — Progress Notes (Signed)
Patient notified of surgery time change and, therefore, time change of when to arrive for surgery/pre-op.  Patient stated understanding that she should arrive at 1030 the day of surgery.

## 2020-08-27 NOTE — Plan of Care (Signed)
  Problem: Education: Goal: Knowledge of General Education information will improve Description: Including pain rating scale, medication(s)/side effects and non-pharmacologic comfort measures Outcome: Progressing   Problem: Education: Goal: Knowledge of disease or condition will improve Outcome: Progressing Goal: Understanding of medication regimen will improve Outcome: Progressing   Problem: Activity: Goal: Ability to tolerate increased activity will improve Outcome: Progressing   Problem: Cardiac: Goal: Ability to achieve and maintain adequate cardiopulmonary perfusion will improve Outcome: Progressing   Problem: Health Behavior/Discharge Planning: Goal: Ability to safely manage health-related needs after discharge will improve Outcome: Progressing

## 2020-08-27 NOTE — Anesthesia Preprocedure Evaluation (Addendum)
Anesthesia Evaluation  Patient identified by MRN, date of birth, ID band Patient awake    Reviewed: Allergy & Precautions, NPO status , Patient's Chart, lab work & pertinent test results  Airway Mallampati: II  TM Distance: >3 FB Neck ROM: Full    Dental  (+) Teeth Intact, Dental Advisory Given   Pulmonary former smoker,    breath sounds clear to auscultation       Cardiovascular negative cardio ROS   Rhythm:Regular Rate:Normal     Neuro/Psych negative neurological ROS  negative psych ROS   GI/Hepatic Neg liver ROS, GERD  ,  Endo/Other  negative endocrine ROS  Renal/GU negative Renal ROS     Musculoskeletal  (+) Arthritis ,   Abdominal Normal abdominal exam  (+)   Peds  Hematology negative hematology ROS (+)   Anesthesia Other Findings   Reproductive/Obstetrics                            Anesthesia Physical Anesthesia Plan  ASA: II  Anesthesia Plan: General   Post-op Pain Management:    Induction: Intravenous  PONV Risk Score and Plan: 4 or greater and Ondansetron, Dexamethasone, Midazolam and Scopolamine patch - Pre-op  Airway Management Planned: LMA  Additional Equipment: None  Intra-op Plan:   Post-operative Plan: Extubation in OR  Informed Consent: I have reviewed the patients History and Physical, chart, labs and discussed the procedure including the risks, benefits and alternatives for the proposed anesthesia with the patient or authorized representative who has indicated his/her understanding and acceptance.       Plan Discussed with: CRNA  Anesthesia Plan Comments:        Anesthesia Quick Evaluation

## 2020-08-27 NOTE — Anesthesia Procedure Notes (Signed)
Procedure Name: LMA Insertion Date/Time: 08/27/2020 12:17 PM Performed by: Mechele Claude, CRNA Pre-anesthesia Checklist: Patient identified, Emergency Drugs available, Suction available and Patient being monitored Patient Re-evaluated:Patient Re-evaluated prior to induction Oxygen Delivery Method: Circle system utilized Preoxygenation: Pre-oxygenation with 100% oxygen Induction Type: IV induction Ventilation: Mask ventilation without difficulty LMA: LMA inserted LMA Size: 4.0 Number of attempts: 1 Airway Equipment and Method: Bite block Placement Confirmation: positive ETCO2 Tube secured with: Tape Dental Injury: Teeth and Oropharynx as per pre-operative assessment

## 2020-08-27 NOTE — Progress Notes (Signed)
  TC from Dr. Smith Robert, Anesthesia. Pt developed arrhythmia during OR case. EKG confirmed atrial fibrillation. Dr. Smith Robert spoke with cardiologist on-call who recommended admission for evaluation given impending holiday. Will admit initially for Obs

## 2020-08-27 NOTE — Anesthesia Postprocedure Evaluation (Signed)
Anesthesia Post Note  Patient: [name]  Procedure(s) Performed: DILATATION AND CURETTAGE /HYSTEROSCOPY (N/A Uterus)     Patient location during evaluation: PACU Anesthesia Type: General Level of consciousness: awake and alert Pain management: pain level controlled Vital Signs Assessment: post-procedure vital signs reviewed and stable Respiratory status: spontaneous breathing, nonlabored ventilation, respiratory function stable and patient connected to nasal cannula oxygen Cardiovascular status: blood pressure returned to baseline and stable Postop Assessment: no apparent nausea or vomiting Anesthetic complications: no Comments: New onset afib, rate 80-90's. Discussed with Dr. Meda Coffee (Cardiology) and Dr. Harrington Challenger (OB/GYN), [name] will be admitted and evaluated.    No complications documented.  Last Vitals:  Vitals:   08/27/20 1345 08/27/20 1430  BP: 118/70 129/76  Pulse: 97 97  Resp: 18   Temp: 36.7 C   SpO2: 94% 96%    Last Pain:  Vitals:   08/27/20 1330  TempSrc:   PainSc: [name]

## 2020-08-27 NOTE — Op Note (Signed)
Pre-Operative Diagnosis: 1) Postmenopausal bleeding Postoperative Diagnosis: 1) Postmenopausal bleeding Procedure: hysteroscopy, Dilation and Curettage Surgeon: Dr. Vanessa Kick Assistant: None Operative Findings: Cervix with minimal posterior lip consistent with prior cervical cone biopsy. Small 2-3 mm polyp noted protruding from the posterior aspect of the external cervical os. The endometrial cavity appears to have a broad septum upon entering. Prior in office ultrasound demonstrated an arcuate appearance to the endometrial cavity, however direct visualization appears to be more consistent with a broad septum. On either side of this structure are small cavities that each contain tubal ostia.  Specimen: Endocervical polyp, endometrial currettings EBL: minimal  [name] 61 Is a 59 yo postmenopausal female  who presents for surgical evaluation  for postmenopausal bleeding. Please see the patient's history and physical for complete details of the history. Management options were discussed with the patient. R/B/A reviewed. Following appropriate informed consent was taken to the operating room. The patient was appropriately identified during a time out procedure. General anesthesia was administered and the patient was placed in the dorsal lithotomy position. The patient was prepped and draped in the normal sterile fashion. A speculum was placed into the vagina, a single-tooth tenaculum was placed on the anterior lip of the cervix, and 10 cc of 1% lidocaine was administered in a paracervical fashion. The cervix was serially dilated with Hank dilators. The hysteroscope was introduced for the above findings. The hysteroscope was removed and a sharp curettage was performed. The hysteroscope was reintroduced and there was no significant difference in the appearance of the endometrial cavity. This completed the surgical procedure. All sponge and instrument counts were correct. The patient tolerated the procedure  well and was transferred to PACU in stable condition

## 2020-08-27 NOTE — Progress Notes (Signed)
PHARMACY NOTE -  Eliquis  Pharmacy has been assisting with dosing of Eliquis for post-op Afib.  CHADS2-VASc score currently only 1 d/t gender, but echo pending and patient is on chronic estrogen/progest therapy; defer long-term anticoag to PCP  Dosage remains stable at 5 mg PO bid and need for further dosage adjustment appears unlikely at present given age << 82 and weight >> 60 kg  Pharmacy will sign off, following peripherally for dose adjustments. Please reconsult if a change in clinical status warrants re-evaluation of dosage.  Reuel Boom, PharmD, BCPS [phone] 08/27/2020, 5:56 PM

## 2020-08-27 NOTE — Consult Note (Addendum)
Cardiology Consultation:   Patient ID: [name] MRN: [id number]; DOB: 1960/10/04  Admit date: 08/27/2020 Date of Consult: 08/27/2020   Primary Care Provider: Redmond School, MD [facility] Cardiologist: New (Dr. Meda Coffee) [place] Electrophysiologist:  None    Patient Profile:   [name] is a 59 y.o. female with a history of mitral valve prolapse, GERD, degenerative disc disease, scoliosis, postmenopausal bleeding who had dilatation and curettage/hysterectomy today and developed post-op atrial fibrillation for which Cardiology was consulted at the request of Dr. Harrington Challenger.  History of Present Illness:   [name] is a 59 year old female with the above history. Patient has a history of mitral valve prolapse for which she was previously followed by Dr. Melvern Banker. However, she has not been seen by Cardiology is several years. There was reportedly an Echo scanned into the chart from 2008 which showed LVEF of 55% with trace MR/TR, mild AV sclerosis but no evidence of stenosis/regurgitation, and no evidence of mitral valve prolapse. She has had postmenopausal bleeding and therefore underwent outpatient dilatation and curettage/hysterecomty today with Dr. Harrington Challenger. Following procedure, she was noted to be in atrial fibrillation with rates in the 80's. Patient was admitted for observation and Cardiology was consulted.   EKG shows atrial fibrillation, rate 88 bpm, with non-specific ST/T changes and poor R wave progression in precordial leads. WBC 7.6, Hgb 17.0, Plts 280.   The patient states that she had mitral valve repaired for severe mitral regurgitation at age 35, she was followed for years by Dr. Carney Bern, last seen about 10 years ago.  She was told she had normal LVEF and no residual mitral regurgitation.  The patient has been doing exceptionally well, she is fully active able to perform her activities of daily living, and has not been experiencing any chest pain, dyspnea  on exertion no lower extremity edema orthopnea proximal nocturnal dyspnea.  She does admit that she has been experiencing occasional palpitations and usually last minutes and are not associated with other symptoms such as dizziness presyncope or syncope.  Today during her anesthesia for D&C, the anesthesiologist noticed that she went into atrial fibrillation.  She is not aware that she has ever had an episode before.  She is currently denying any palpitation and feels well.  She states that she was previously on metoprolol but it was discontinued because it caused profound fatigue.  Past Medical History:  Diagnosis Date  . Arthritis   . Cancer ([place])    Skin on left hand  . DDD (degenerative disc disease), lumbar   . DDD (degenerative disc disease), lumbosacral   . GERD (gastroesophageal reflux disease)    Uses OTC medication as needed  . 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 History of mitral valve prolapse    per pt previous has seen cardiologist , dr gamble , yrs ago--- echo scanned in epic dated 04-22-2007 (scanned on dated 06-20-2010)  shows no evidence mvp, ef >55%, trace MR/TR and mild AV sclerosis but no stenosis or regurg.  Marland Kitchen History of vulvar dysplasia    VIN3  . Lumbar spondylosis   . OA (osteoarthritis)   . PMB (postmenopausal bleeding)   . Scoliosis   . Wears glasses     Past Surgical History:  Procedure Laterality Date  . ANTERIOR CERVICAL DECOMP/DISCECTOMY FUSION  11-25-2009  @[facility]    C4 --- C7  . ANTERIOR LAT LUMBAR FUSION Left 10/22/2016   Procedure: Anterior Lateral  Lumbar Fusion - Lumbar two - lumbar three, with Lateral plate;  Surgeon: Kary Kos, MD;  Location: [facility];  Service: Neurosurgery;  Laterality: Left;  Anterior Lateral Lumbar Fusion - Lumbar two - lumbar three, with Lateral plate  . CARDIAC CATHETERIZATION  age 65  . CO2 LASER APPLICATION N/A 63/78/5885   Procedure: CO2 LASER APPLICATION;  Surgeon: Gus Height, MD;   Location: [facility];  Service: Gynecology;  Laterality: N/A;  . COLONOSCOPY W/ POLYPECTOMY    . LAPAROSCOPIC CHOLECYSTECTOMY  04/2006  . LEG SURGERY Right 1983   femoral artery repair from cardiac cath injury   . OPEN REDUCTION INTERNAL FIXATION (ORIF) METACARPAL Right 08/12/2017   Procedure: OPEN REDUCTION INTERNAL FIXATION (ORIF) 5th METACARPAL fracture;  Surgeon: Roseanne Kaufman, MD;  Location: [facility];  Service: Orthopedics;  Laterality: Right;  60 mins  . SPINAL FIXATION SURGERY  1973   age 63 - sclosis  . TOTAL HIP ARTHROPLASTY Right 06-16-2010 @[facility]   . UPPER GASTROINTESTINAL ENDOSCOPY    . VSD REPAIR  age 58  . VULVECTOMY N/A 09/18/2015   Procedure: WIDE EXCISION VULVECTOMY;  Surgeon: Vanessa Kick, MD;  Location: [facility];  Service: Gynecology;  Laterality: N/A;     Inpatient Medications: Scheduled Meds: . diltiazem  5 mg Intravenous Once  . [START ON 08/28/2020] estrogens-methylTEST  1 tablet Oral Daily  . famotidine  20 mg Oral BID  . [START ON 08/28/2020] medroxyPROGESTERone  2.5 mg Oral Daily  . [START ON 08/28/2020] meloxicam  15 mg Oral Daily  . traMADol  100 mg Oral QID   Continuous Infusions: . diltiazem (CARDIZEM) infusion     PRN Meds: acetaminophen, ondansetron **OR** ondansetron (ZOFRAN) IV, senna-docusate  Allergies:    Allergies  Allergen Reactions  . Other Dermatitis    medahoney causes skin reaction looked like fungal infection    Social History:   Social History   Socioeconomic History  . Marital status: Married    Spouse name: Not on file  . Number of children: Not on file  . Years of education: Not on file  . Highest education level: Not on file  Occupational History  . Occupation: [employer]  Tobacco Use  . Smoking status: Former Smoker    Packs/day: 1.00    Years: 20.00    Pack years: 20.00    Types: Cigarettes    Quit date: 09/07/2002    Years since quitting: 17.9  . Smokeless tobacco: Never Used  Vaping Use  . Vaping Use: Never used   Substance and Sexual Activity  . Alcohol use: No  . Drug use: Never  . Sexual activity: Yes    Birth control/protection: Post-menopausal  Other Topics Concern  . Not on file  Social History Narrative  . Not on file   Social Determinants of Health   Financial Resource Strain: Not on file  Food Insecurity: Not on file  Transportation Needs: Not on file  Physical Activity: Not on file  Stress: Not on file  Social Connections: Not on file  Intimate Partner Violence: Not on file    Family History:    Family History  Problem Relation Age of Onset  . Diabetes Other   . Heart disease Other   . COPD Mother   . Diabetes Father   . Pneumonia Father   . Alcoholism Father   . Colon cancer Neg Hx      ROS:  Please see the history of present illness.  All other ROS reviewed and negative.  Physical Exam/Data:   Vitals:   08/27/20 1345 08/27/20 1430 08/27/20 1445 08/27/20 1644  BP: 118/70 129/76  (!) 138/91  Pulse: 97 97 99 (!) 105  Resp: 18   19  Temp: 98 F (36.7 C)   98.3 F (36.8 C)  TempSrc:    Oral  SpO2: 94% 96%  98%  Weight:      Height:        Intake/Output Summary (Last 24 hours) at 08/27/2020 1753 Last data filed at 08/27/2020 1600 Gross per 24 hour  Intake 1400 ml  Output 715 ml  Net 685 ml   Last 3 Weights 08/27/2020 08/26/2020 05/29/2020  Weight (lbs) 197 lb 8 oz 197 lb 209 lb  Weight (kg) 89.585 kg 89.359 kg 94.802 kg     Body mass index is 36.12 kg/m.  General:  Well nourished, well developed, in no acute distress HEENT: normal Lymph: no adenopathy Neck: no JVD Endocrine:  No thryomegaly Vascular: No carotid bruits; FA pulses 2+ bilaterally without bruits  Cardiac:  normal S1, S2; RRR; 2/6 systolic murmur  Lungs:  clear to auscultation bilaterally, no wheezing, rhonchi or rales  Abd: soft, nontender, no hepatomegaly  Ext: no edema Musculoskeletal:  No deformities, BUE and BLE strength normal and equal Skin: warm and dry  Neuro:  CNs  2-12 intact, no focal abnormalities noted Psych:  Normal affect   EKG:  The EKG was personally reviewed and demonstrates: Atrial fibrillation with RVR, nonspecific ST-T wave abnormalities, previous EKGs are showing normal sinus rhythm and normal EKG. Telemetry:  Telemetry was personally reviewed and demonstrates: Atrial fibrillation with ventricular rates from low 100s to 120 beats per minute  Relevant CV Studies: Echocardiogram is pending  Laboratory Data:  High Sensitivity Troponin:  No results for input(s): TROPONINIHS in the last 720 hours.   ChemistryNo results for input(s): NA, K, CL, CO2, GLUCOSE, BUN, CREATININE, CALCIUM, GFRNONAA, GFRAA, ANIONGAP in the last 168 hours.  No results for input(s): PROT, ALBUMIN, AST, ALT, ALKPHOS, BILITOT in the last 168 hours. Hematology Recent Labs  Lab 08/27/20 1055  WBC 7.6  RBC 5.31*  HGB 17.0*  HCT 51.8*  MCV 97.6  MCH 32.0  MCHC 32.8  RDW 13.6  PLT 280   BNPNo results for input(s): BNP, PROBNP in the last 168 hours.  DDimer No results for input(s): DDIMER in the last 168 hours.   Radiology/Studies:  No results found.   Assessment and Plan:   1. New onset atrial fibrillation with RVR in the setting of anesthesia for D&C, the patient also has history of mitral valve repair for severe mitral regurgitation, there is no prior echocardiogram in epic, she has not seen cardiologist in 10 years, her CHA2DS2-VASc score is 1 and she is agreeable to this being started on Eliquis 5 mg p.o. twice daily  We will obtain an echocardiogram to evaluate for LVEF, left atrial size and degree of mitral regurgitation she has minimal murmur, I will start her on Cardizem drip at 5 mg/h as she tends to be hypotensive, I will switch it to p.o. Cardizem in the morning depending how she tolerates it overnight.  She previously did not tolerate beta-blockers due to profound fatigue.    CHA2DS2-VASc Score = 1  This indicates a 0.6% annual risk of  stroke. The patient's score is based upon: CHF History: No HTN History: No Diabetes History: No Stroke History: No Vascular Disease History: No Age Score: 0 Gender Score: 1     For  questions or updates, please contact [place] Please consult [email or web address] for contact info under    Signed, Ena Dawley, MD  08/27/2020 5:53 PM

## 2020-08-27 NOTE — Transfer of Care (Signed)
Immediate Anesthesia Transfer of Care Note  Patient: [name]  Procedure(s) Performed: Procedure(s) (LRB): DILATATION AND CURETTAGE /HYSTEROSCOPY (N/A)  Patient Location: PACU  Anesthesia Type: General  Level of Consciousness: awake, alert  and oriented  Airway & Oxygen Therapy: Patient Spontanous Breathing and Patient connected to nasal cannula oxygen  Post-op Assessment: Report given to PACU RN and Post -op Vital signs reviewed and stable  Post vital signs: Reviewed and stable  Complications: No apparent anesthesia complicationsLast Vitals:  Vitals Value Taken Time  BP 119/77 08/27/20 1300  Temp 36.7 C 08/27/20 1300  Pulse 95 08/27/20 1301  Resp 12 08/27/20 1301  SpO2 99 % 08/27/20 1301  Vitals shown include unvalidated device data.  Last Pain:  Vitals:   08/27/20 1113  TempSrc: Oral  PainSc: 0-No pain      Patients Stated Pain Goal: 2 (94/80/16 5537)  Complications: No complications documented.

## 2020-08-28 ENCOUNTER — Observation Stay (HOSPITAL_BASED_OUTPATIENT_CLINIC_OR_DEPARTMENT_OTHER): Payer: PRIVATE HEALTH INSURANCE

## 2020-08-28 ENCOUNTER — Encounter (HOSPITAL_BASED_OUTPATIENT_CLINIC_OR_DEPARTMENT_OTHER): Payer: Self-pay | Admitting: Obstetrics and Gynecology

## 2020-08-28 DIAGNOSIS — N950 Postmenopausal bleeding: Secondary | ICD-10-CM | POA: Diagnosis not present

## 2020-08-28 DIAGNOSIS — Z9889 Other specified postprocedural states: Secondary | ICD-10-CM

## 2020-08-28 DIAGNOSIS — I4891 Unspecified atrial fibrillation: Secondary | ICD-10-CM

## 2020-08-28 LAB — ECHOCARDIOGRAM COMPLETE
Area-P 1/2: 3.48 cm2
Height: 62 in
S' Lateral: 2.9 cm
Weight: 3160 oz

## 2020-08-28 LAB — SURGICAL PATHOLOGY

## 2020-08-28 MED ORDER — PERFLUTREN LIPID MICROSPHERE
1.0000 mL | INTRAVENOUS | Status: AC | PRN
  Administered 2020-08-28: 2 mL via INTRAVENOUS
  Filled 2020-08-28: qty 10

## 2020-08-28 MED ORDER — APIXABAN 5 MG PO TABS: 5.0000 mg | ORAL_TABLET | Freq: Two times a day (BID) | ORAL | 3 refills | Status: DC

## 2020-08-28 MED ORDER — APIXABAN 5 MG PO TABS
5.0000 mg | ORAL_TABLET | Freq: Two times a day (BID) | ORAL | Status: DC
  Administered 2020-08-28: 5 mg via ORAL
  Filled 2020-08-28: qty 1

## 2020-08-28 MED ORDER — DILTIAZEM HCL ER COATED BEADS 120 MG PO CP24
120.0000 mg | ORAL_CAPSULE | Freq: Every day | ORAL | Status: DC
  Administered 2020-08-28: 120 mg via ORAL
  Filled 2020-08-28: qty 1

## 2020-08-28 NOTE — Progress Notes (Signed)
Cardizem discontinued per MD order and per protocol. Oral Cardizem and Eliquis both were given one hour past per order. Plan for pt to discharge home. Will monitor for orders. SRp, RN

## 2020-08-28 NOTE — Discharge Summary (Signed)
Gynecology Discharge Summary      Patient Name: [name] DOB: [birth year] MRN: [id number]  Date of admission: 08/27/2020  Date of discharge: 08/28/2020  Admitting diagnosis: Arrhythmia [I49.9]  Secondary diagnosis:  Active Problems:   Arrhythmia   H/O mitral valve repair Postmenopausal bleeding  Additional problems: Atrial fibrillation    Discharge diagnosis: postmenopausal bleeding, new onset atrial fibrillation                                           Hospital course: Pt was admitted for outpatient surgery, hysteroscopy, D&C, for postmenopausal bleeding. After induction of anesthesia she was noted to have an abnormal cardiac rhythm that appeared to be afib. The surgical procedure was uncomplicated. EKG in PACU confirmed afib and the patient was admitted for 23 hr obs for cardiology consultation and anticoagulation. 2D echo was performed. She was started on abixaban po and will follow up in afib clinic in 3 weeks.   Physical exam  Vitals:   08/27/20 2102 08/28/20 0032 08/28/20 0515 08/28/20 1155  BP: 115/73 114/70 119/67 109/67  Pulse: 74 83 63 76  Resp: 16 16 16  (!) 23  Temp: 98.2 F (36.8 C) 98.2 F (36.8 C) 98.2 F (36.8 C) 98.3 F (36.8 C)  TempSrc:      SpO2: 95% 93% 97% 93%  Weight:      Height:       Labs: Lab Results  Component Value Date   WBC 7.6 08/27/2020   HGB 17.0 (H) 08/27/2020   HCT 51.8 (H) 08/27/2020   MCV 97.6 08/27/2020   PLT 280 08/27/2020   CMP Latest Ref Rng & Units 08/27/2020  Glucose 70 - 99 mg/dL 08/29/2020)  BUN 6 - 20 mg/dL 17  Creatinine 657(Q - 4.69 mg/dL 6.29  Sodium 5.28 - 413 mmol/L 134(L)  Potassium 3.5 - 5.1 mmol/L 4.0  Chloride 98 - 111 mmol/L 100  CO2 22 - 32 mmol/L 26  Calcium 8.9 - 10.3 mg/dL 8.9   Edinburgh Score: No flowsheet data found.    After visit meds:  Allergies as of 08/28/2020      Reactions   Other Dermatitis   medahoney causes skin reaction looked like fungal infection      Medication List     STOP taking these medications   medroxyPROGESTERone 5 MG tablet Commonly known as: PROVERA     TAKE these medications   apixaban 5 MG Tabs tablet Commonly known as: Eliquis Take 1 tablet (5 mg total) by mouth 2 (two) times daily.   COLLAGEN 1500/C PO Take 3 capsules by mouth daily.   estrogens-methylTEST 1.25-2.5 MG Tabs tablet Take 1 tablet by mouth daily.   HYDROcodone-acetaminophen 5-325 MG tablet Commonly known as: NORCO/VICODIN Take 1 tablet by mouth every 6 (six) hours as needed for moderate pain. What changed:   how much to take  reasons to take this   ibuprofen 800 MG tablet Commonly known as: ADVIL Take 1 tablet (800 mg total) by mouth every 8 (eight) hours as needed.   meloxicam 15 MG tablet Commonly known as: MOBIC Take 15 mg by mouth daily.   OVER THE COUNTER MEDICATION Take 2 capsules by mouth daily. Zinc, vit C and Vit D   polyvinyl alcohol 1.4 % ophthalmic solution Commonly known as: LIQUIFILM TEARS Place 1 drop into both eyes daily.   traMADol 50  MG tablet Commonly known as: ULTRAM Take 100 mg by mouth every morning.        Discharge home in stable condition Future Appointments: Future Appointments  Date Time Provider [place]  09/24/2020 10:00 AM Sherran Needs, NP [facility] None   Follow up Visit:  Follow-up Information    Vanessa Kick, MD Follow up in 4 week(s).   Specialty: Obstetrics and Gynecology Why: For a postoperative evaluation Contact information: [place] [address] [place] Follow up on 09/24/2020.   Specialty: Cardiology Why: at 10am  Contact information: [address] [id number] [place] [address] [phone]                  08/28/2020 Vanessa Kick, MD

## 2020-08-28 NOTE — Progress Notes (Signed)
Progress Note  Patient Name: [name] Date of Encounter: 08/28/2020  Primary Cardiologist: New to [facility]   The patient is asymptomatic, denies any chest pain or shortness of breath no palpitations.  She slept well overnight.  Inpatient Medications    Scheduled Meds: . estrogens-methylTEST  1 tablet Oral Daily  . famotidine  20 mg Oral BID  . medroxyPROGESTERone  2.5 mg Oral Daily  . meloxicam  15 mg Oral Daily  . traMADol  100 mg Oral QID   Continuous Infusions: . diltiazem (CARDIZEM) infusion 5 mg/hr (08/27/20 1804)   PRN Meds: acetaminophen, ondansetron **OR** ondansetron (ZOFRAN) IV, senna-docusate   Vital Signs    Vitals:   08/27/20 1644 08/27/20 2102 08/28/20 0032 08/28/20 0515  BP: (!) 138/91 115/73 114/70 119/67  Pulse: (!) 105 74 83 63  Resp: 19 16 16 16   Temp: 98.3 F (36.8 C) 98.2 F (36.8 C) 98.2 F (36.8 C) 98.2 F (36.8 C)  TempSrc: Oral     SpO2: 98% 95% 93% 97%  Weight:      Height:        Intake/Output Summary (Last 24 hours) at 08/28/2020 0803 Last data filed at 08/28/2020 0600 Gross per 24 hour  Intake 1704.51 ml  Output 715 ml  Net 989.51 ml   Filed Weights   08/26/20 0957 08/27/20 1119  Weight: 89.4 kg 89.6 kg    Physical Exam   General: Well developed, well nourished, NAD Skin: Warm, dry, intact  Head: Normocephalic, atraumatic, sclera non-icteric, no xanthomas, clear, moist mucus membranes. Neck: Negative for carotid bruits. No JVD Lungs:Clear to ausculation bilaterally. No wheezes, rales, or rhonchi. Breathing is unlabored. Cardiovascular: iRRR with S1 S2.  2 out of 6 systolic murmur, rubs, gallops, or LV heave appreciated. Abdomen: Soft, non-tender, non-distended with normoactive bowel sounds. No hepatomegaly, No rebound/guarding. No obvious abdominal masses. MSK: Strength and tone appear normal for age. 5/5 in all extremities Extremities: No edema. No clubbing or cyanosis. DP/PT pulses 2+  bilaterally Neuro: Alert and oriented. No focal deficits. No facial asymmetry. MAE spontaneously. Psych: Responds to questions appropriately with normal affect.     Labs    Chemistry Recent Labs  Lab 08/27/20 1750  NA 134*  K 4.0  CL 100  CO2 26  GLUCOSE 352*  BUN 17  CREATININE 0.74  CALCIUM 8.9  GFRNONAA >60  ANIONGAP 8     Hematology Recent Labs  Lab 08/27/20 1055  WBC 7.6  RBC 5.31*  HGB 17.0*  HCT 51.8*  MCV 97.6  MCH 32.0  MCHC 32.8  RDW 13.6  PLT 280    Cardiac EnzymesNo results for input(s): TROPONINI in the last 168 hours. No results for input(s): TROPIPOC in the last 168 hours.   BNPNo results for input(s): BNP, PROBNP in the last 168 hours.   DDimer No results for input(s): DDIMER in the last 168 hours.   Radiology    No results found.  Telemetry    Atrial fibrillation with controlled rates in 70s- Personally Reviewed  ECG    No new tracing- Personally Reviewed  Cardiac Studies   Echocardiogram scheduled for 08/28/20   Patient Profile     59 y.o. female with a historyofmitral valve prolapse, GERD, degenerative disc disease, scoliosis, postmenopausal bleeding who had dilatation and curettage/hysterectomy today and developed post-op atrial fibrillation for which Cardiology was consultedat the request of Dr. Harrington Challenger.  Assessment & Plan    1. New onset atrial  fibrillation with RVR in the setting of anesthesia for D&C, the patient also has history of mitral valve repair for severe mitral regurgitation, there is no prior echocardiogram in epic, she has not seen cardiologist in 10 years, her CHA2DS2-VASc score is 1 and she is agreeable to this being started on Eliquis 5 mg p.o. twice daily  The patient remains in atrial fibrillation now with controlled rates in 70s, I will switch IV diltiazem to Cardizem CD 120 mg daily.  Echo tech just arrived, I have reviewed first few images and it shows LVEF of 60 to 65% with no wall motion abnormalities,  mild mitral regurgitation and normal size of left atrium.  We will arrange follow-up at the A. fib clinic, and if she remains in atrial fibrillation in 3 weeks, will arrange outpatient cardioversion.  The patient can be discharged today.  Signed, Ena Dawley, MD 08/28/2020, 8:03 AM     For questions or updates, please contact   Please consult [email or web address] for contact info under Cardiology/STEMI.

## 2020-08-28 NOTE — Progress Notes (Signed)
  Echocardiogram 2D Echocardiogram has been performed.  [name] 08/28/2020, 10:37 AM

## 2020-08-28 NOTE — Progress Notes (Signed)
Discharged to home instructions reviewed with patients. Acknowledged understanding. SRP, RN

## 2020-08-28 NOTE — Addendum Note (Signed)
Addendum  created 08/28/20 0707 by Bonney Aid, CRNA   Charge Capture section accepted

## 2020-09-11 IMAGING — DX DG CHEST 2V
2 series · 2 of 2 positions shown · non-contrast
Comparison: 07/30/2014

CLINICAL DATA: Dyspnea

EXAM:
CHEST - 2 VIEW

[chest pa]
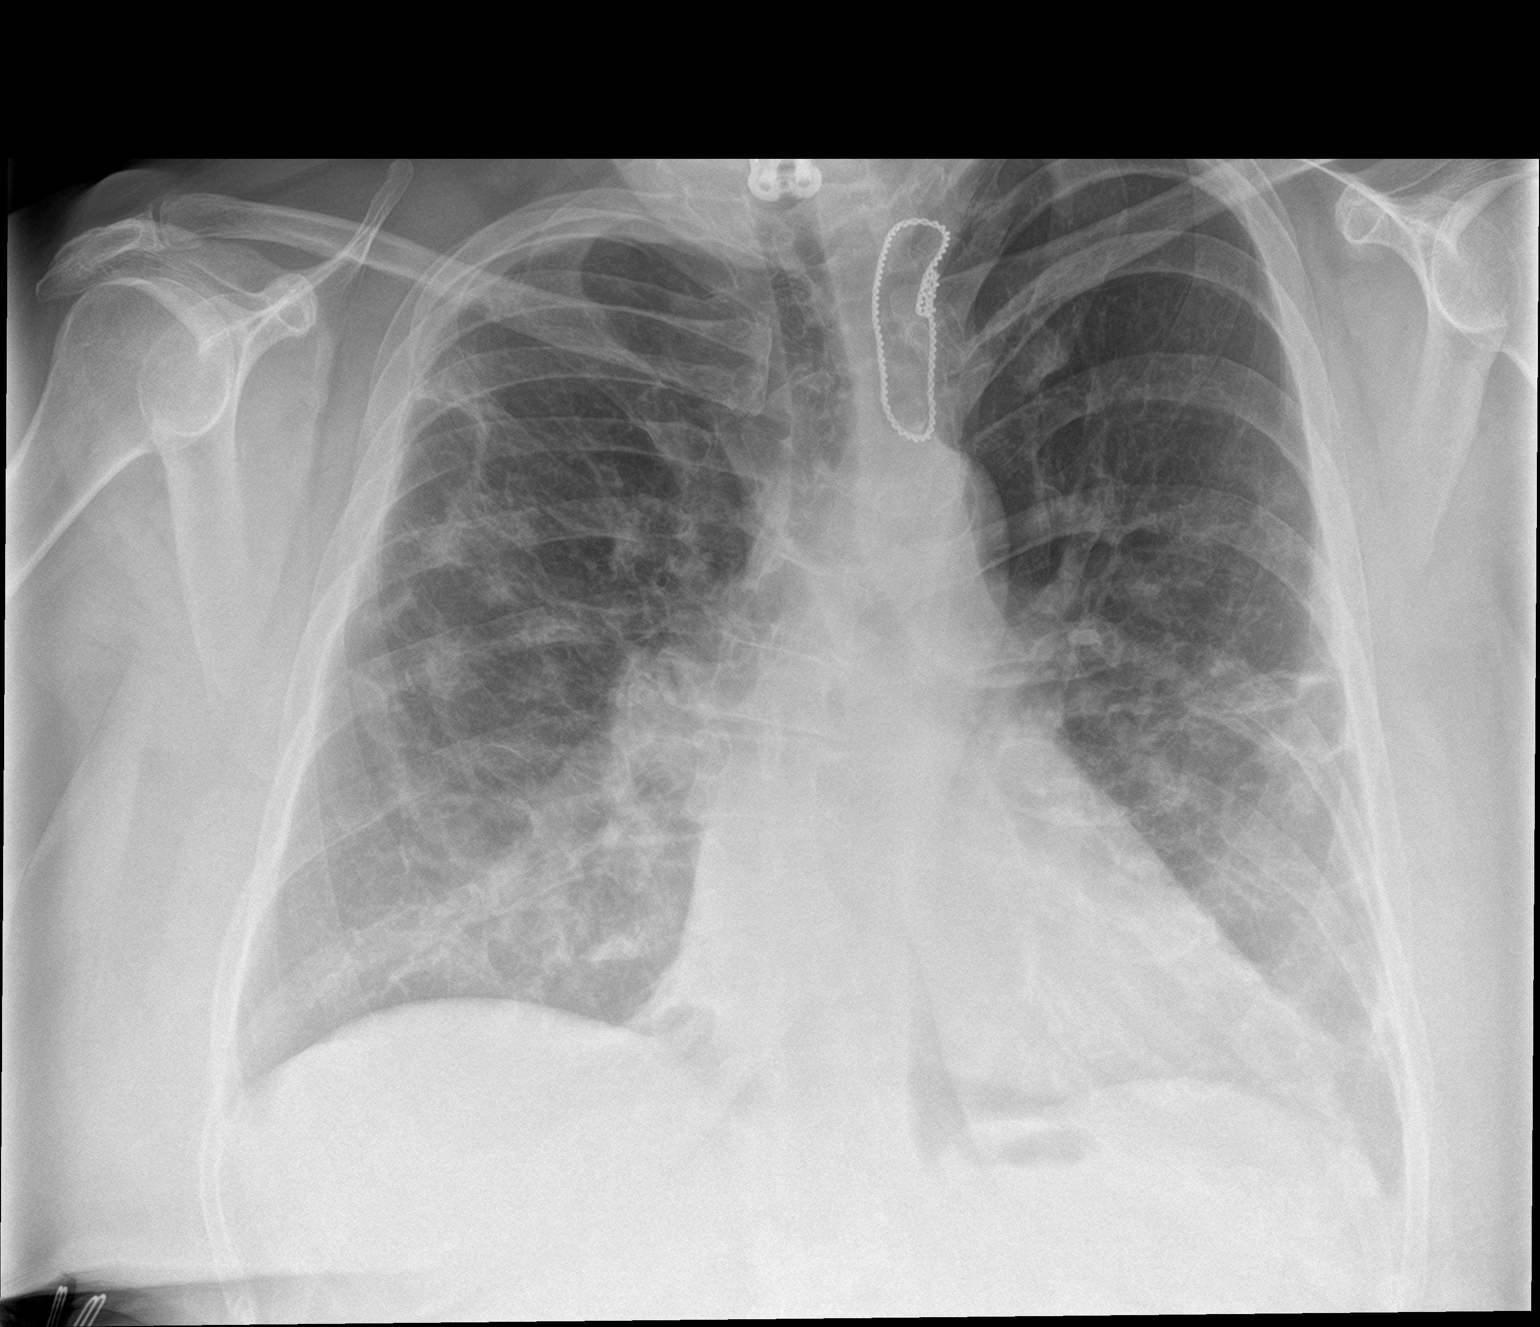

[chest lat]
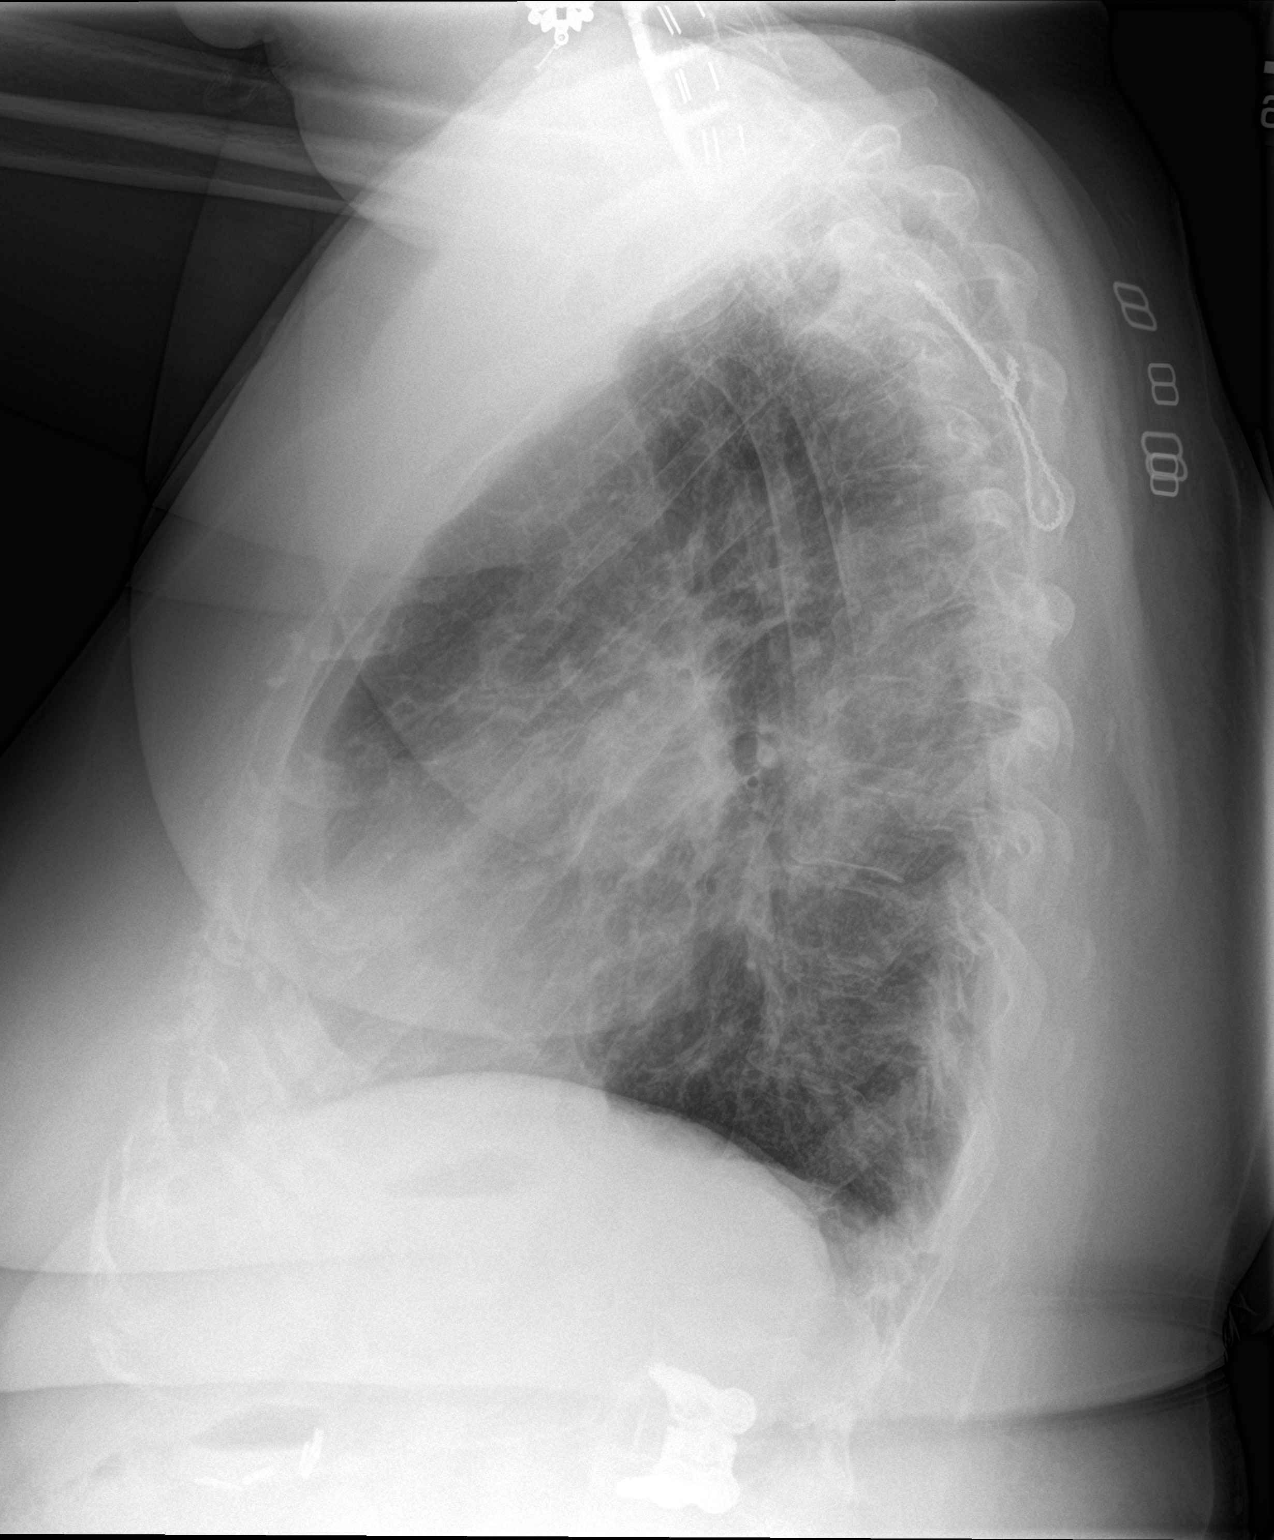

[2 of 2 positions shown; findings below may reference images not displayed]

FINDINGS: Frontal and lateral views of the chest demonstrate an unremarkable
cardiac silhouette. There is scattered interstitial and ground-glass
opacities throughout the lungs consistent with multifocal pneumonia.
No effusion or pneumothorax. No acute bony abnormalities.
Postsurgical changes are seen within the spine.
IMPRESSION: 1. Multifocal interstitial and ground-glass opacities consistent
with pneumonia.

## 2020-09-24 ENCOUNTER — Ambulatory Visit (HOSPITAL_COMMUNITY): Payer: PRIVATE HEALTH INSURANCE | Admitting: Nurse Practitioner

## 2020-10-02 ENCOUNTER — Ambulatory Visit (HOSPITAL_COMMUNITY)
Admission: RE | Admit: 2020-10-02 | Discharge: 2020-10-02 | Disposition: A | Discharge status: Home / Self Care | Payer: PRIVATE HEALTH INSURANCE | Source: Ambulatory Visit | Attending: Nurse Practitioner | Admitting: Nurse Practitioner

## 2020-10-02 ENCOUNTER — Other Ambulatory Visit: Payer: Self-pay

## 2020-10-02 ENCOUNTER — Encounter (HOSPITAL_COMMUNITY): Payer: Self-pay | Admitting: Nurse Practitioner

## 2020-10-02 VITALS — BP 136/84 | HR 100 | Ht 62.0 in | Wt 198.6 lb

## 2020-10-02 DIAGNOSIS — I4891 Unspecified atrial fibrillation: Secondary | ICD-10-CM | POA: Insufficient documentation

## 2020-10-02 DIAGNOSIS — Z8249 Family history of ischemic heart disease and other diseases of the circulatory system: Secondary | ICD-10-CM | POA: Diagnosis not present

## 2020-10-02 DIAGNOSIS — D6869 Other thrombophilia: Secondary | ICD-10-CM | POA: Diagnosis not present

## 2020-10-02 DIAGNOSIS — Z87891 Personal history of nicotine dependence: Secondary | ICD-10-CM | POA: Diagnosis not present

## 2020-10-02 DIAGNOSIS — Z7901 Long term (current) use of anticoagulants: Secondary | ICD-10-CM | POA: Insufficient documentation

## 2020-10-02 DIAGNOSIS — I4819 Other persistent atrial fibrillation: Secondary | ICD-10-CM

## 2020-10-02 LAB — BASIC METABOLIC PANEL
Anion gap: 10 (ref 5–15)
BUN: 17 mg/dL (ref 6–20)
CO2: 24 mmol/L (ref 22–32)
Calcium: 9.2 mg/dL (ref 8.9–10.3)
Chloride: 103 mmol/L (ref 98–111)
Creatinine, Ser: 0.77 mg/dL (ref 0.44–1.00)
GFR, Estimated: >60 mL/min (ref >60)
Glucose, Bld: 83 mg/dL (ref 70–99)
Potassium: 4.4 mmol/L (ref 3.5–5.1)
Sodium: 137 mmol/L (ref 135–145)

## 2020-10-02 LAB — CBC
HCT: 45.6 % (ref 36.0–46.0)
Hemoglobin: 15.3 g/dL — ABNORMAL HIGH (ref 12.0–15.0)
MCH: 32.4 pg (ref 26.0–34.0)
MCHC: 33.6 g/dL (ref 30.0–36.0)
MCV: 96.6 fL (ref 80.0–100.0)
Platelets: 277 10*3/uL (ref 150–400)
RBC: 4.72 MIL/uL (ref 3.87–5.11)
RDW: 12.9 % (ref 11.5–15.5)
WBC: 8.8 10*3/uL (ref 4.0–10.5)
nRBC: 0 % (ref 0.0–0.2)

## 2020-10-02 NOTE — Progress Notes (Signed)
Primary Care Physician: Redmond School, MD Referring Physician: Dr. Meda Coffee [facility] f/u appointment    [name] is a 60 y.o. female with a h/o new onset afib found at time of D&C/hysteroscopy in December and was admitted overnight. She was rate controlled and was seen by Dr. Meda Coffee and placed on Eliquis 5 mg bid for a CHA2DS2VASc score of 1. She was referred to the afib clinic to be considered for a cardioversion.   In the clinic today the pt states that she is asymptomatic. She  has no symptoms at all   being in Afib. We discussed cardioversion but she does not want to pursue this but continue to live in afib. She does mention that per her echo she had a mitral valve repair, she has not had a repair.  She  did have cardiology surgery at age 69 but was told she had a "slit" between her rt/left heart and it was "stitched up." She was followed for years by Dr. Melvern Banker.  [name] has been rate controlled by reports in Epic but HR is 100 bpm today. Discussed with pt that if she is going to chose to live in afib then  I would like to place a ZIo patch to make sure she is well rate controlled. Her EF was normal by recent echo.    Today, she denies symptoms of palpitations, chest pain, shortness of breath, orthopnea, PND, lower extremity edema, dizziness, presyncope, syncope, or neurologic sequela. The patient is tolerating medications without difficulties and is otherwise without complaint today.   Past Medical History:  Diagnosis Date  . Arthritis   . Cancer ([place])    Skin on left hand  . DDD (degenerative disc disease), lumbar   . DDD (degenerative disc disease), lumbosacral   . GERD (gastroesophageal reflux disease)    Uses OTC medication as needed  . 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 History of mitral valve prolapse    per pt previous has seen cardiologist , dr gamble , yrs ago--- echo scanned in epic dated 04-22-2007 (scanned on dated 06-20-2010)   shows no evidence mvp, ef >55%, trace MR/TR and mild AV sclerosis but no stenosis or regurg.  Marland Kitchen History of vulvar dysplasia    VIN3  . Lumbar spondylosis   . OA (osteoarthritis)   . PMB (postmenopausal bleeding)   . Scoliosis   . Wears glasses    Past Surgical History:  Procedure Laterality Date  . ANTERIOR CERVICAL DECOMP/DISCECTOMY FUSION  11-25-2009  _0    C4 --- C7  . ANTERIOR LAT LUMBAR FUSION Left 10/22/2016   Procedure: Anterior Lateral Lumbar Fusion - Lumbar two - lumbar three, with Lateral plate;  Surgeon: Kary Kos, MD;  Location: [facility];  Service: Neurosurgery;  Laterality: Left;  Anterior Lateral Lumbar Fusion - Lumbar two - lumbar three, with Lateral plate  . CARDIAC CATHETERIZATION  age 48  . CO2 LASER APPLICATION N/A 21/19/4174   Procedure: CO2 LASER APPLICATION;  Surgeon: Gus Height, MD;  Location: [facility];  Service: Gynecology;  Laterality: N/A;  . COLONOSCOPY W/ POLYPECTOMY    . HYSTEROSCOPY WITH D & C N/A 08/27/2020   Procedure: DILATATION AND CURETTAGE /HYSTEROSCOPY;  Surgeon: Vanessa Kick, MD;  Location: [facility];  Service: Gynecology;  Laterality: N/A;  . LAPAROSCOPIC CHOLECYSTECTOMY  04/2006  . LEG SURGERY Right 1983   femoral artery repair from cardiac cath injury   . OPEN REDUCTION INTERNAL  FIXATION (ORIF) METACARPAL Right 08/12/2017   Procedure: OPEN REDUCTION INTERNAL FIXATION (ORIF) 5th METACARPAL fracture;  Surgeon: Roseanne Kaufman, MD;  Location: [facility];  Service: Orthopedics;  Laterality: Right;  60 mins  . SPINAL FIXATION SURGERY  1973   age 60 - sclosis  . TOTAL HIP ARTHROPLASTY Right 06-16-2010 _0   . UPPER GASTROINTESTINAL ENDOSCOPY    . VSD REPAIR  age 32  . VULVECTOMY N/A 09/18/2015   Procedure: WIDE EXCISION VULVECTOMY;  Surgeon: Vanessa Kick, MD;  Location: [facility];  Service: Gynecology;  Laterality: N/A;    Current Outpatient Medications  Medication Sig Dispense Refill  . apixaban (ELIQUIS) 5 MG TABS tablet Take 1 tablet (5 mg  total) by mouth 2 (two) times daily. 60 tablet 3  . Collagen-Vitamin C-Biotin (COLLAGEN 1500/C PO) Take 3 capsules by mouth daily.    Marland Kitchen estrogens-methylTEST 1.25-2.5 MG TABS tablet Take 1 tablet by mouth daily.    . meloxicam (MOBIC) 15 MG tablet Take 15 mg by mouth daily.    Marland Kitchen OVER THE COUNTER MEDICATION Take 2 capsules by mouth daily. Zinc, vit C and Vit D    . traMADol (ULTRAM) 50 MG tablet Take 100 mg by mouth every morning.     No current facility-administered medications for this encounter.    Allergies  Allergen Reactions  . Other Dermatitis    medahoney causes skin reaction looked like fungal infection    Social History   Socioeconomic History  . Marital status: Married    Spouse name: Not on file  . Number of children: Not on file  . Years of education: Not on file  . Highest education level: Not on file  Occupational History  . Occupation: [employer]  Tobacco Use  . Smoking status: Former Smoker    Packs/day: 1.00    Years: 20.00    Pack years: 20.00    Types: Cigarettes    Quit date: 09/07/2002    Years since quitting: 18.0  . Smokeless tobacco: Never Used  Vaping Use  . Vaping Use: Never used  Substance and Sexual Activity  . Alcohol use: No  . Drug use: Never  . Sexual activity: Yes    Birth control/protection: Post-menopausal  Other Topics Concern  . Not on file  Social History Narrative  . Not on file   Social Determinants of Health   Financial Resource Strain: Not on file  Food Insecurity: Not on file  Transportation Needs: Not on file  Physical Activity: Not on file  Stress: Not on file  Social Connections: Not on file  Intimate Partner Violence: Not on file    Family History  Problem Relation Age of Onset  . Diabetes Other   . Heart disease Other   . COPD Mother   . Diabetes Father   . Pneumonia Father   . Alcoholism Father   . Colon cancer Neg Hx     ROS- All systems are reviewed and negative except as per the HPI above  Physical  Exam: Vitals:   10/02/20 1002  BP: 136/84  Pulse: 100  Weight: 90.1 kg  Height: _1  (1.575 m)   Wt Readings from Last 3 Encounters:  10/02/20 90.1 kg  08/27/20 89.6 kg  05/29/20 94.8 kg    Labs: Lab Results  Component Value Date   NA 134 (L) 08/27/2020   K 4.0 08/27/2020   CL 100 08/27/2020   CO2 26 08/27/2020   GLUCOSE 352 (H) 08/27/2020   BUN 17 08/27/2020  CREATININE 0.74 08/27/2020   CALCIUM 8.9 08/27/2020   MG 1.9 08/27/2020   Lab Results  Component Value Date   INR 0.96 06/12/2010   No results found for: CHOL, HDL, LDLCALC, TRIG   GEN- The patient is well appearing, alert and oriented x 3 today.   Head- normocephalic, atraumatic Eyes-  Sclera clear, conjunctiva pink Ears- hearing intact Oropharynx- clear Neck- supple, no JVP Lymph- no cervical lymphadenopathy Lungs- Clear to ausculation bilaterally, normal work of breathing Heart- irregular rate and rhythm, no murmurs, rubs or gallops, PMI not laterally displaced GI- soft, NT, ND, + BS Extremities- no clubbing, cyanosis, or edema MS- no significant deformity or atrophy Skin- no rash or lesion Psych- euthymic mood, full affect Neuro- strength and sensation are intact  EKG-  afib at 100 bpm, qrs int 74 ms, qtc 417 ms  Echo- 1. Left ventricular ejection fraction, by estimation, is 60 to 65%. The  left ventricle has normal function. The left ventricle has no regional  wall motion abnormalities. Left ventricular diastolic function could not  be evaluated.  2. Right ventricular systolic function is normal. The right ventricular  size is normal. There is normal pulmonary artery systolic pressure. The  estimated right ventricular systolic pressure is 44.9 mmHg.  3. Left atrial size was mildly dilated.  4. A small pericardial effusion is present. The pericardial effusion is  posterior to the left ventricle. There is no evidence of cardiac  tamponade.  5. Hx of MV repair. Either an annuloplasty ring  is present or mild MAC is  present. Unclear surgical details, but does not have typical appearance of  annuloplasty ring. The mitral valve has been repaired/replaced. Mild  mitral valve regurgitation. No  evidence of mitral stenosis.  6. The aortic valve is tricuspid. There is mild calcification of the  aortic valve. There is mild thickening of the aortic valve. Aortic valve  regurgitation is not visualized. Mild aortic valve sclerosis is present,  with no evidence of aortic valve  stenosis.  7. The inferior vena cava is normal in size with greater than 50%  respiratory variability, suggesting right atrial pressure of 3 mmHg.   Assessment and Plan: 1. Afib  Found at time of a surgical procedure December 2021 Pt states rate controlled at home and is not on rate control meds but is running at 100 bpm today  She also prefers not to have a cardioversion as she feels well Discussed cardioversion with her so she would understand  the procedure and being low risk but she still defers She is wlling to wear a one week Zio patch to make sure she is well rate controlled if she is to remain in afib   2. CHA2DS2VASc score of 1(female) Discussed guidelines re staying on anticoagulation  For now pt would like to continue anticoagulation  Bmet/cbc today for being on anticoagulation x one month Discussed bleeding risk with her being on daily antiinflammatories  She states that she has to be on them to be able to live with pain from  Arthritis  F/u with Dr. Meda Coffee in 6-8 weeks    Geroge Baseman. Margaree Sandhu, [facility] [address] [place], [place] [address] [phone]

## 2020-10-15 ENCOUNTER — Encounter: Payer: Self-pay | Admitting: Internal Medicine

## 2020-10-22 ENCOUNTER — Telehealth (HOSPITAL_COMMUNITY): Payer: Self-pay | Admitting: *Deleted

## 2020-10-22 NOTE — Telephone Encounter (Signed)
Called to inform patient of heart monitor results. Patient cut me off mid-sentence when I said the recommendation of metoprolol stating she is NOT taking metoprolol and will not be starting any medication. States she sees cardiology the end of march then hung up the phone.  Will forward to M.Lenze that will be seeing the patient in March as FYI.

## 2020-10-22 NOTE — Telephone Encounter (Signed)
Not sure why it was sent to me. I haven't seen patient. thanks

## 2020-10-22 NOTE — Telephone Encounter (Signed)
-----   Message from Sherran Needs, NP sent at 10/21/2020  8:41 AM EST ----- I reviewed monitor. Pt wants to live in afib but I think she needs some rate control to be better controlled. I would recommending toprol 12.5 mg daily. She can take in am or pm.

## 2020-11-25 NOTE — Progress Notes (Addendum)
Cardiology Office Note    Date:  11/27/2020   ID:  [name], DOB [birth year], MRN [id number]   PCP:  [facility], [place]  Cardiologist:  No primary care provider on file.  Advanced Practice Provider:  No care team member to display Electrophysiologist:  None   [id number]   No chief complaint on file.   History of Present Illness:  [name] is a 60 y.o. female who had new onset atrial fibrillation with RVR in the setting of anesthesia for D&C.  She has a history of what sounds like VSD repair at 60 yr old but echo mentions some type of mitral valve repair which patient says she never had.  CHA2DS2-VASc equals 1 for age.  She was placed on Eliquis and sent to the A. fib clinic but declined cardioversion.  She wanted to stay on Eliquis.  Zio monitor showed average heart rate 105 with some rapid heart rates.  Metoprolol recommended but patient declined.  Patient comes in today accompanied by her husband.  Many questions answered.  Went over her monitor in detail with them and recommend rate control as to avoid further cardiac problems.  She is willing to try diltiazem as she has had trouble on beta-blockers in the past.  She has a very active job going up and down stairs all day long and does not want things to slow her down.  No bleeding problems on Eliquis but has missed some afternoon doses.    Past Medical History:  Diagnosis Date  . Arthritis   . Cancer ([place])    Skin on left hand  . DDD (degenerative disc disease), lumbar   . DDD (degenerative disc disease), lumbosacral   . GERD (gastroesophageal reflux disease)    Uses OTC medication as needed  . 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 History of mitral valve prolapse    per pt previous has seen cardiologist , dr gamble , yrs ago--- echo scanned in epic dated 04-22-2007 (scanned on dated 06-20-2010)  shows no evidence mvp, ef >55%,  trace MR/TR and mild AV sclerosis but no stenosis or regurg.  Marland Kitchen History of vulvar dysplasia    VIN3  . Lumbar spondylosis   . OA (osteoarthritis)   . PMB (postmenopausal bleeding)   . Scoliosis   . Wears glasses     Past Surgical History:  Procedure Laterality Date  . ANTERIOR CERVICAL DECOMP/DISCECTOMY FUSION  11-25-2009  _0    C4 --- C7  . ANTERIOR LAT LUMBAR FUSION Left 10/22/2016   Procedure: Anterior Lateral Lumbar Fusion - Lumbar two - lumbar three, with Lateral plate;  Surgeon: Kary Kos, MD;  Location: [facility];  Service: Neurosurgery;  Laterality: Left;  Anterior Lateral Lumbar Fusion - Lumbar two - lumbar three, with Lateral plate  . CARDIAC CATHETERIZATION  age 48  . CO2 LASER APPLICATION N/A 51/76/1607   Procedure: CO2 LASER APPLICATION;  Surgeon: Gus Height, MD;  Location: [facility];  Service: Gynecology;  Laterality: N/A;  . COLONOSCOPY W/ POLYPECTOMY    . HYSTEROSCOPY WITH D & C N/A 08/27/2020   Procedure: DILATATION AND CURETTAGE /HYSTEROSCOPY;  Surgeon: Vanessa Kick, MD;  Location: [facility];  Service: Gynecology;  Laterality: N/A;  . LAPAROSCOPIC CHOLECYSTECTOMY  04/2006  . LEG SURGERY Right 1983   femoral artery repair from cardiac cath injury   . OPEN REDUCTION INTERNAL FIXATION (ORIF) METACARPAL  Right 08/12/2017   Procedure: OPEN REDUCTION INTERNAL FIXATION (ORIF) 5th METACARPAL fracture;  Surgeon: Roseanne Kaufman, MD;  Location: [facility];  Service: Orthopedics;  Laterality: Right;  60 mins  . SPINAL FIXATION SURGERY  1973   age 9 - sclosis  . TOTAL HIP ARTHROPLASTY Right 06-16-2010 _0   . UPPER GASTROINTESTINAL ENDOSCOPY    . VSD REPAIR  age 5  . VULVECTOMY N/A 09/18/2015   Procedure: WIDE EXCISION VULVECTOMY;  Surgeon: Vanessa Kick, MD;  Location: [facility];  Service: Gynecology;  Laterality: N/A;    Current Medications: Current Meds  Medication Sig  . apixaban (ELIQUIS) 5 MG TABS tablet Take 1 tablet (5 mg total) by mouth 2 (two) times daily.  .  Collagen-Vitamin C-Biotin (COLLAGEN 1500/C PO) Take 3 capsules by mouth daily.  Marland Kitchen diltiazem (CARDIZEM CD) 120 MG 24 hr capsule Take 1 capsule (120 mg total) by mouth daily.  Marland Kitchen estrogens-methylTEST 1.25-2.5 MG TABS tablet Take 1 tablet by mouth daily.  . meloxicam (MOBIC) 15 MG tablet Take 15 mg by mouth daily.  Marland Kitchen OVER THE COUNTER MEDICATION Take 2 capsules by mouth daily. Zinc, vit C and Vit D  . traMADol (ULTRAM) 50 MG tablet Take 100 mg by mouth every morning.     Allergies:   Patient has no active allergies.   Social History   Socioeconomic History  . Marital status: Married    Spouse name: Not on file  . Number of children: Not on file  . Years of education: Not on file  . Highest education level: Not on file  Occupational History  . Occupation: [employer]  Tobacco Use  . Smoking status: Former Smoker    Packs/day: 1.00    Years: 20.00    Pack years: 20.00    Types: Cigarettes    Quit date: 09/07/2002    Years since quitting: 18.2  . Smokeless tobacco: Never Used  Vaping Use  . Vaping Use: Never used  Substance and Sexual Activity  . Alcohol use: No  . Drug use: Never  . Sexual activity: Yes    Birth control/protection: Post-menopausal  Other Topics Concern  . Not on file  Social History Narrative  . Not on file   Social Determinants of Health   Financial Resource Strain: Not on file  Food Insecurity: Not on file  Transportation Needs: Not on file  Physical Activity: Not on file  Stress: Not on file  Social Connections: Not on file     Family History:  The patient's family history includes Alcoholism in her father; COPD in her mother; Diabetes in her father and another family member; Heart disease in an other family member; Pneumonia in her father.   ROS:   Please see the history of present illness.    ROS All other systems reviewed and are negative.   PHYSICAL EXAM:   VS:  BP 134/78   Pulse 100   Ht _1  (1.575 m)   Wt 197 lb (89.4 kg)   SpO2 94%    BMI 36.03 kg/m   Physical Exam  GEN: Well nourished, well developed, in no acute distress  Neck: no JVD, carotid bruits, or masses Cardiac:irreg irreg no murmurs, rubs, or gallops  Respiratory:  clear to auscultation bilaterally, normal work of breathing GI: soft, nontender, nondistended, + BS Ext: without cyanosis, clubbing, or edema, Good distal pulses bilaterally Neuro:  Alert and Oriented x 3 Psych: euthymic mood, full affect  Wt Readings from Last 3 Encounters:  11/27/20  197 lb (89.4 kg)  10/02/20 198 lb 9.6 oz (90.1 kg)  08/27/20 197 lb 8 oz (89.6 kg)      Studies/Labs Reviewed:   EKG:  EKG is not ordered today.   Recent Labs: 08/27/2020: Magnesium 1.9; TSH 0.860 10/02/2020: BUN 17; Creatinine, Ser 0.77; Hemoglobin 15.3; Platelets 277; Potassium 4.4; Sodium 137   Lipid Panel No results found for: CHOL, TRIG, HDL, CHOLHDL, VLDL, LDLCALC, LDLDIRECT  Additional studies/ records that were reviewed today include:  CO monitor 10/14/2020 Study Highlights  Persistent atrial fibrillation  (Burden 100%) V rates are frequently elevated with average HR of 102 bpm (range 62-185 bpm)  Echo-  08/28/20  Left ventricular ejection fraction, by estimation, is 60 to 65%. The  left ventricle has normal function. The left ventricle has no regional  wall motion abnormalities. Left ventricular diastolic function could not  be evaluated.   2. Right ventricular systolic function is normal. The right ventricular  size is normal. There is normal pulmonary artery systolic pressure. The  estimated right ventricular systolic pressure is 33.8 mmHg.   3. Left atrial size was mildly dilated.   4. A small pericardial effusion is present. The pericardial effusion is  posterior to the left ventricle. There is no evidence of cardiac  tamponade.   5. Hx of MV repair. Either an annuloplasty ring is present or mild MAC is  present. Unclear surgical details, but does not have typical appearance of   annuloplasty ring. The mitral valve has been repaired/replaced. Mild  mitral valve regurgitation. No  evidence of mitral stenosis.   6. The aortic valve is tricuspid. There is mild calcification of the  aortic valve. There is mild thickening of the aortic valve. Aortic valve  regurgitation is not visualized. Mild aortic valve sclerosis is present,  with no evidence of aortic valve  stenosis.   7. The inferior vena cava is normal in size with greater than 50%  respiratory variability, suggesting right atrial pressure of 3 mmHg.      Risk Assessment/Calculations:    CHA2DS2-VASc Score = 1  This indicates a 0.6% annual risk of stroke. The patient's score is based upon: CHF History: No HTN History: No Diabetes History: No Stroke History: No Vascular Disease History: No Age Score: 0 Gender Score: 1         ASSESSMENT:    1. Paroxysmal atrial fibrillation (HCC)      PLAN:  In order of problems listed above:  Atrial fibrillation found at the time of the surgical procedure 08/2020.  Was placed on Eliquis and seen in the A. fib clinic for possible cardioversion.  Patient declined wanting to stay in atrial fibrillation heart rate was 100 that day so Zio patch was placed.  CHA2DS2-VASc equals 1 for female.  Guidelines discussed with patient but she wanted to stay on anticoagulation.  Zio monitor 10/14/2020 persistent atrial fibrillation ventricular rates frequently elevated with an average heart rate of 102 bpm but many fast rates.  Metoprolol recommended but patient declined. Patient willing to try diltiazem 120 mg daily. She'll stay on eliquis for now and see her back in 3 weeks to see how she's doing.   History of "hole in her heart repaired at age 66" but never had mitral valve repair as echo indicates. Will ask MD to relook at echo.MD reviewed the echo report and said that it looked atypical and more like mitral annular calcium (MAC). That is what is on the MV annulus and not  prior  annuloplasty. There is nothing to suggest prior VSD repair, but we would not see this as it is long healed up and likely graft.    Shared Decision Making/Informed Consent        Medication Adjustments/Labs and Tests Ordered: Current medicines are reviewed at length with the patient today.  Concerns regarding medicines are outlined above.  Medication changes, Labs and Tests ordered today are listed in the Patient Instructions below. Patient Instructions  Medication Instructions:  Your physician has recommended you make the following change in your medication:   START: Diltiazem 122m daily  *If you need a refill on your cardiac medications before your next appointment, please call your pharmacy*   Lab Work: None today If you have labs (blood work) drawn today and your tests are completely normal, you will receive your results only by: .Marland KitchenMyChart Message (if you have MyChart) OR . A paper copy in the mail If you have any lab test that is abnormal or we need to change your treatment, we will call you to review the results.   Follow-Up: [facility] you and your health needs are our priority.  As part of our continuing mission to provide you with exceptional heart care, we have created designated Provider Care Teams.  These Care Teams include your primary Cardiologist (physician) and Advanced Practice Providers (APPs -  Physician Assistants and Nurse Practitioners) who all work together to provide you with the care you need, when you need it.  Your next appointment:   12/17/2020 with MErmalinda Barrios PA    Signed, MErmalinda Barrios PA-C  11/27/2020 10:35 AM    [facility] [address] [place]  [address]Phone: [phone] Fax: [phone]

## 2020-11-27 ENCOUNTER — Ambulatory Visit (INDEPENDENT_AMBULATORY_CARE_PROVIDER_SITE_OTHER): Payer: PRIVATE HEALTH INSURANCE | Admitting: Physician Assistant

## 2020-11-27 ENCOUNTER — Other Ambulatory Visit: Payer: Self-pay

## 2020-11-27 ENCOUNTER — Encounter: Payer: Self-pay | Admitting: Physician Assistant

## 2020-11-27 VITALS — BP 134/78 | HR 100 | Ht 62.0 in | Wt 197.0 lb

## 2020-11-27 DIAGNOSIS — I480 Paroxysmal atrial fibrillation: Secondary | ICD-10-CM | POA: Diagnosis not present

## 2020-11-27 MED ORDER — DILTIAZEM HCL ER COATED BEADS 120 MG PO CP24: 120.0000 mg | ORAL_CAPSULE | Freq: Every day | ORAL | 3 refills | Status: DC

## 2020-11-27 NOTE — Patient Instructions (Signed)
Medication Instructions:  Your physician has recommended you make the following change in your medication:   START: Diltiazem 120mg  daily  *If you need a refill on your cardiac medications before your next appointment, please call your pharmacy*   Lab Work: None today If you have labs (blood work) drawn today and your tests are completely normal, you will receive your results only by: Marland Kitchen MyChart Message (if you have MyChart) OR . A paper copy in the mail If you have any lab test that is abnormal or we need to change your treatment, we will call you to review the results.   Follow-Up: At [facility], you and your health needs are our priority.  As part of our continuing mission to provide you with exceptional heart care, we have created designated Provider Care Teams.  These Care Teams include your primary Cardiologist (physician) and Advanced Practice Providers (APPs -  Physician Assistants and Nurse Practitioners) who all work together to provide you with the care you need, when you need it.  Your next appointment:   12/17/2020 with Ermalinda Barrios, PA

## 2020-12-11 NOTE — Progress Notes (Signed)
Cardiology Office Note    Date:  12/17/2020   ID:  RHETTA CLEEK, DOB [birth year], MRN [id number]   PCP:  [facility], [place]  Cardiologist:  No primary care provider on file.  Advanced Practice Provider:  No care team member to display Electrophysiologist:  None   [id number]   Chief Complaint  Patient presents with  . Follow-up    History of Present Illness:  [name] is a 60 y.o. female who had new onset atrial fibrillation with RVR in the setting of anesthesia for D&C.  She has a history of what sounds like VSD repair at 60 yr old but echo mentions some type of mitral valve repair which patient says she never had.  CHA2DS2-VASc equals 1 for age.  She was placed on Eliquis and sent to the A. fib clinic but declined cardioversion.  She wanted to stay on Eliquis.  Zio monitor showed average heart rate 105 with some rapid heart rates.  Metoprolol recommended but patient declined.    I saw the patient 11/27/2020 and went over the monitor results in detail.  She was willing to try diltiazem and she was not having bleeding problems on Eliquis but had missed some doses.  This weekend while eating breakfast she became very dizzy. BP and HR were stable. Lasted about 3 min and resolved. Has cut back on caffeine. Pulse running 88-99 when she checks it. Higher when she walks. Tolerating diltiazem well. Not interested in cardioversion.        Past Medical History:  Diagnosis Date  . Arthritis   . Cancer ([facility] [facility])    Skin on left hand  . DDD (degenerative disc disease), lumbar   . DDD (degenerative disc disease), lumbosacral   . GERD (gastroesophageal reflux disease)    Uses OTC medication as needed  . 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 History of mitral valve prolapse    per pt previous has seen cardiologist , dr gamble , yrs ago--- echo scanned in epic dated 04-22-2007 (scanned on dated  06-20-2010)  shows no evidence mvp, ef >55%, trace MR/TR and mild AV sclerosis but no stenosis or regurg.  Marland Kitchen History of vulvar dysplasia    VIN3  . Lumbar spondylosis   . OA (osteoarthritis)   . PMB (postmenopausal bleeding)   . Scoliosis   . Wears glasses     Past Surgical History:  Procedure Laterality Date  . ANTERIOR CERVICAL DECOMP/DISCECTOMY FUSION  11-25-2009  _0    C4 --- C7  . ANTERIOR LAT LUMBAR FUSION Left 10/22/2016   Procedure: Anterior Lateral Lumbar Fusion - Lumbar two - lumbar three, with Lateral plate;  Surgeon: Kary Kos, MD;  Location: [facility];  Service: Neurosurgery;  Laterality: Left;  Anterior Lateral Lumbar Fusion - Lumbar two - lumbar three, with Lateral plate  . CARDIAC CATHETERIZATION  age 71  . CO2 LASER APPLICATION N/A 14/97/0263   Procedure: CO2 LASER APPLICATION;  Surgeon: Gus Height, MD;  Location: [facility];  Service: Gynecology;  Laterality: N/A;  . COLONOSCOPY W/ POLYPECTOMY    . HYSTEROSCOPY WITH D & C N/A 08/27/2020   Procedure: DILATATION AND CURETTAGE /HYSTEROSCOPY;  Surgeon: Vanessa Kick, MD;  Location: [facility];  Service: Gynecology;  Laterality: N/A;  . LAPAROSCOPIC CHOLECYSTECTOMY  04/2006  . LEG SURGERY Right 1983   femoral artery repair from cardiac cath injury   . OPEN  REDUCTION INTERNAL FIXATION (ORIF) METACARPAL Right 08/12/2017   Procedure: OPEN REDUCTION INTERNAL FIXATION (ORIF) 5th METACARPAL fracture;  Surgeon: Roseanne Kaufman, MD;  Location: [facility];  Service: Orthopedics;  Laterality: Right;  60 mins  . SPINAL FIXATION SURGERY  1973   age 9 - sclosis  . TOTAL HIP ARTHROPLASTY Right 06-16-2010 _0   . UPPER GASTROINTESTINAL ENDOSCOPY    . VSD REPAIR  age 27  . VULVECTOMY N/A 09/18/2015   Procedure: WIDE EXCISION VULVECTOMY;  Surgeon: Vanessa Kick, MD;  Location: [facility];  Service: Gynecology;  Laterality: N/A;    Current Medications: Current Meds  Medication Sig  . Collagen-Vitamin C-Biotin (COLLAGEN 1500/C PO) Take 3  capsules by mouth daily.  Marland Kitchen diltiazem (CARDIZEM CD) 180 MG 24 hr capsule Take 1 capsule (180 mg total) by mouth daily.  Marland Kitchen estrogens-methylTEST 1.25-2.5 MG TABS tablet Take 1 tablet by mouth daily.  . medroxyPROGESTERone (PROVERA) 5 MG tablet Take 5 mg by mouth daily.  . meloxicam (MOBIC) 15 MG tablet Take 15 mg by mouth daily.  Marland Kitchen OVER THE COUNTER MEDICATION Take 2 capsules by mouth daily. Zinc, vit C and Vit D  . traMADol (ULTRAM) 50 MG tablet Take 100 mg by mouth every morning.  . [DISCONTINUED] diltiazem (CARDIZEM CD) 120 MG 24 hr capsule Take 1 capsule (120 mg total) by mouth daily.     Allergies:   Patient has no active allergies.   Social History   Socioeconomic History  . Marital status: Married    Spouse name: Not on file  . Number of children: Not on file  . Years of education: Not on file  . Highest education level: Not on file  Occupational History  . Occupation: [employer]  Tobacco Use  . Smoking status: Former Smoker    Packs/day: 1.00    Years: 20.00    Pack years: 20.00    Types: Cigarettes    Quit date: 09/07/2002    Years since quitting: 18.2  . Smokeless tobacco: Never Used  Vaping Use  . Vaping Use: Never used  Substance and Sexual Activity  . Alcohol use: No  . Drug use: Never  . Sexual activity: Yes    Birth control/protection: Post-menopausal  Other Topics Concern  . Not on file  Social History Narrative  . Not on file   Social Determinants of Health   Financial Resource Strain: Not on file  Food Insecurity: Not on file  Transportation Needs: Not on file  Physical Activity: Not on file  Stress: Not on file  Social Connections: Not on file     Family History:  The patient's family history includes Alcoholism in her father; COPD in her mother; Diabetes in her father and another family member; Heart disease in an other family member; Pneumonia in her father.   ROS:   Please see the history of present illness.    ROS All other systems reviewed  and are negative.   PHYSICAL EXAM:   VS:  BP 120/80   Pulse 99   Ht _1  (1.575 m)   Wt 192 lb 9.6 oz (87.4 kg)   SpO2 95%   BMI 35.23 kg/m   Physical Exam  GEN: Obese, in no acute distress  Neck: no JVD, carotid bruits, or masses Cardiac:irreg irreg; no murmurs, rubs, or gallops  Respiratory:  clear to auscultation bilaterally, normal work of breathing GI: soft, nontender, nondistended, + BS Ext: without cyanosis, clubbing, or edema, Good distal pulses bilaterally Neuro:  Alert and  Oriented x 3 Psych: euthymic mood, full affect  Wt Readings from Last 3 Encounters:  12/17/20 192 lb 9.6 oz (87.4 kg)  11/27/20 197 lb (89.4 kg)  10/02/20 198 lb 9.6 oz (90.1 kg)      Studies/Labs Reviewed:   EKG:  EKG is not ordered today.    Recent Labs: 08/27/2020: Magnesium 1.9; TSH 0.860 10/02/2020: BUN 17; Creatinine, Ser 0.77; Hemoglobin 15.3; Platelets 277; Potassium 4.4; Sodium 137   Lipid Panel No results found for: CHOL, TRIG, HDL, CHOLHDL, VLDL, LDLCALC, LDLDIRECT  Additional studies/ records that were reviewed today include:  CO monitor 10/14/2020 Study Highlights   Persistent atrial fibrillation  (Burden 100%) V rates are frequently elevated with average HR of 102 bpm (range 62-185 bpm)   Echo-  08/28/20  Left ventricular ejection fraction, by estimation, is 60 to 65%. The  left ventricle has normal function. The left ventricle has no regional  wall motion abnormalities. Left ventricular diastolic function could not  be evaluated.   2. Right ventricular systolic function is normal. The right ventricular  size is normal. There is normal pulmonary artery systolic pressure. The  estimated right ventricular systolic pressure is 48.2 mmHg.   3. Left atrial size was mildly dilated.   4. A small pericardial effusion is present. The pericardial effusion is  posterior to the left ventricle. There is no evidence of cardiac  tamponade.   5. Hx of MV repair. Either an annuloplasty  ring is present or mild MAC is  present. Unclear surgical details, but does not have typical appearance of  annuloplasty ring. The mitral valve has been repaired/replaced. Mild  mitral valve regurgitation. No  evidence of mitral stenosis.   6. The aortic valve is tricuspid. There is mild calcification of the  aortic valve. There is mild thickening of the aortic valve. Aortic valve  regurgitation is not visualized. Mild aortic valve sclerosis is present,  with no evidence of aortic valve  stenosis.   7. The inferior vena cava is normal in size with greater than 50%  respiratory variability, suggesting right atrial pressure of 3 mmHg.         Risk Assessment/Calculations:    CHA2DS2-VASc Score = 1  This indicates a 0.6% annual risk of stroke. The patient's score is based upon: CHF History: No HTN History: No Diabetes History: No Stroke History: No Vascular Disease History: No Age Score: 0 Gender Score: 1         ASSESSMENT:    1. Persistent atrial fibrillation (HCC)      PLAN:  In order of problems listed above:  Atrial fibrillation found at the time of the surgical procedure 08/2020.  Was placed on Eliquis and seen in the A. fib clinic for possible cardioversion.  Patient declined wanting to stay in atrial fibrillation heart rate was 100 that day so Zio patch was placed.  CHA2DS2-VASc equals 1 for female.  Zio monitor 10/14/2020 persistent atrial fibrillation ventricular rates frequently elevated with an average heart rate of 102 bpm but many fast rates.  Metoprolol recommended but patient declined. I placed the patient on diltiazem 120 mg daily. She is feeling better and not "jittery". HR still goes up when walking but better controlled at rest. Will increase it to 180 mg daily. Patient doesn't want to proceed with DCCV so she'll stop Eliquis since CHADSVASC =1    History of "hole in her heart repaired at age 60" but never had mitral valve repair as echo indicates. 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 That is what is on the MV annulus and not prior annuloplasty. There is nothing to suggest prior VSD repair, but we would not see this as it is long healed up and likely graft.      Shared Decision Making/Informed Consent        Medication Adjustments/Labs and Tests Ordered: Current medicines are reviewed at length with the patient today.  Concerns regarding medicines are outlined above.  Medication changes, Labs and Tests ordered today are listed in the Patient Instructions below. Patient Instructions  Medication Instructions:   STOP TAKING ELIQUIS  INCREASE YOUR DILTIAZEM (CARDIZEM CD) TO 180 MG BY MOUTH DAILY  *If you need a refill on your cardiac medications before your next appointment, please call your pharmacy*    Follow-Up:  WITH Nathaneal Sommers PA-C IN THE OFFICE ON Jan 28, 2021 AT 11:45 AM--SCHEDULING PLEASE ADD PATIENT TO THIS [facility] [address] PA-C      Signed, Ermalinda Barrios, PA-C  12/17/2020 11:58 AM    [facility] [address], [place], [place]  [address] Phone: [phone]; Fax: [phone]

## 2020-12-17 ENCOUNTER — Encounter: Payer: Self-pay | Admitting: Physician Assistant

## 2020-12-17 ENCOUNTER — Ambulatory Visit (INDEPENDENT_AMBULATORY_CARE_PROVIDER_SITE_OTHER): Payer: PRIVATE HEALTH INSURANCE | Admitting: Physician Assistant

## 2020-12-17 ENCOUNTER — Other Ambulatory Visit: Payer: Self-pay

## 2020-12-17 VITALS — BP 120/80 | HR 99 | Ht 62.0 in | Wt 192.6 lb

## 2020-12-17 DIAGNOSIS — I4819 Other persistent atrial fibrillation: Secondary | ICD-10-CM | POA: Diagnosis not present

## 2020-12-17 MED ORDER — DILTIAZEM HCL ER COATED BEADS 180 MG PO CP24: 180.0000 mg | ORAL_CAPSULE | Freq: Every day | ORAL | 2 refills | Status: DC

## 2020-12-17 NOTE — Patient Instructions (Addendum)
Medication Instructions:   STOP TAKING ELIQUIS  INCREASE YOUR DILTIAZEM (CARDIZEM CD) TO 180 MG BY MOUTH DAILY  *If you need a refill on your cardiac medications before your next appointment, please call your pharmacy*    Follow-Up:  WITH MICHELE LENZE PA-C IN THE OFFICE ON Jan 28, 2021 AT 11:45 AM--SCHEDULING PLEASE ADD PATIENT TO THIS SLOT PER MICHELE LENZE PA-C

## 2021-01-07 MED ORDER — APIXABAN 5 MG PO TABS: 5.0000 mg | ORAL_TABLET | Freq: Two times a day (BID) | ORAL | 3 refills | Status: DC

## 2021-01-21 NOTE — Progress Notes (Signed)
Cardiology Office Note    Date:  01/28/2021   ID:  [name], DOB 1961/03/18, MRN [id number]   PCP:  [facility], [place]  Cardiologist:  None   Advanced Practice Provider:  No care team member to display Electrophysiologist:  None   [id number]}   No chief complaint on file.   History of Present Illness:  [name] is a 60 y.o. female who had new onset atrial fibrillation with RVR in the setting of anesthesia for D&C.  She has a history of what sounds like VSD repair at 60 yr old but echo mentions some type of mitral valve repair which patient says she never had.  CHA2DS2-VASc equals 1 for age.  She was placed on Eliquis and sent to the A. fib clinic but declined cardioversion.  She wanted to stay on Eliquis.  Zio monitor showed average heart rate 105 with some rapid heart rates.  Metoprolol recommended but patient declined.     Patient was not interested in cardioversion but I believe I got a note stating after talking with the PCP she was wanting to have cardioversion.  Patient comes in for f/u. Says she felt heart racing for the first time this am. Same as it feels now and HR's at 39.  Has cut back on coffee. Still on eliquis and considering DCCV. I went over risks/benefits in detail.    Past Medical History:  Diagnosis Date  . Arthritis   . Cancer ([place])    Skin on left hand  . DDD (degenerative disc disease), lumbar   . DDD (degenerative disc disease), lumbosacral   . GERD (gastroesophageal reflux disease)    Uses OTC medication as needed  . 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 History of mitral valve prolapse    per pt previous has seen cardiologist , dr gamble , yrs ago--- echo scanned in epic dated 04-22-2007 (scanned on dated 06-20-2010)  shows no evidence mvp, ef >55%, trace MR/TR and mild AV sclerosis but no stenosis or regurg.  Marland Kitchen History of vulvar dysplasia    VIN3  .  Lumbar spondylosis   . OA (osteoarthritis)   . PMB (postmenopausal bleeding)   . Scoliosis   . Wears glasses     Past Surgical History:  Procedure Laterality Date  . ANTERIOR CERVICAL DECOMP/DISCECTOMY FUSION  11-25-2009  _0    C4 --- C7  . ANTERIOR LAT LUMBAR FUSION Left 10/22/2016   Procedure: Anterior Lateral Lumbar Fusion - Lumbar two - lumbar three, with Lateral plate;  Surgeon: Kary Kos, MD;  Location: [facility];  Service: Neurosurgery;  Laterality: Left;  Anterior Lateral Lumbar Fusion - Lumbar two - lumbar three, with Lateral plate  . CARDIAC CATHETERIZATION  age 39  . CO2 LASER APPLICATION N/A 30/86/5784   Procedure: CO2 LASER APPLICATION;  Surgeon: Gus Height, MD;  Location: [facility];  Service: Gynecology;  Laterality: N/A;  . COLONOSCOPY W/ POLYPECTOMY    . HYSTEROSCOPY WITH D & C N/A 08/27/2020   Procedure: DILATATION AND CURETTAGE /HYSTEROSCOPY;  Surgeon: Vanessa Kick, MD;  Location: [facility];  Service: Gynecology;  Laterality: N/A;  . LAPAROSCOPIC CHOLECYSTECTOMY  04/2006  . LEG SURGERY Right 1983   femoral artery repair from cardiac cath injury   . OPEN REDUCTION INTERNAL FIXATION (ORIF) METACARPAL Right 08/12/2017   Procedure: OPEN REDUCTION INTERNAL FIXATION (ORIF) 5th METACARPAL fracture;  Surgeon: Amedeo Plenty,  Gwyndolyn Saxon, MD;  Location: [facility];  Service: Orthopedics;  Laterality: Right;  60 mins  . SPINAL FIXATION SURGERY  1973   age 70 - sclosis  . TOTAL HIP ARTHROPLASTY Right 06-16-2010 _0   . UPPER GASTROINTESTINAL ENDOSCOPY    . VSD REPAIR  age 81  . VULVECTOMY N/A 09/18/2015   Procedure: WIDE EXCISION VULVECTOMY;  Surgeon: Vanessa Kick, MD;  Location: [facility];  Service: Gynecology;  Laterality: N/A;    Current Medications: Current Meds  Medication Sig  . apixaban (ELIQUIS) 5 MG TABS tablet Take 1 tablet (5 mg total) by mouth 2 (two) times daily.  . Collagen-Vitamin C-Biotin (COLLAGEN 1500/C PO) Take 3 capsules by mouth daily.  Marland Kitchen diltiazem (CARDIZEM CD) 180  MG 24 hr capsule Take 1 capsule (180 mg total) by mouth daily.  Marland Kitchen estrogens-methylTEST 1.25-2.5 MG TABS tablet Take 1 tablet by mouth daily.  . medroxyPROGESTERone (PROVERA) 5 MG tablet Take 5 mg by mouth daily.  . meloxicam (MOBIC) 15 MG tablet Take 15 mg by mouth daily.  Marland Kitchen OVER THE COUNTER MEDICATION Take 2 capsules by mouth daily. Zinc, vit C and Vit D  . traMADol (ULTRAM) 50 MG tablet Take 100 mg by mouth every morning.     Allergies:   Patient has no active allergies.   Social History   Socioeconomic History  . Marital status: Married    Spouse name: Not on file  . Number of children: Not on file  . Years of education: Not on file  . Highest education level: Not on file  Occupational History  . Occupation: [employer]  Tobacco Use  . Smoking status: Former Smoker    Packs/day: 1.00    Years: 20.00    Pack years: 20.00    Types: Cigarettes    Quit date: 09/07/2002    Years since quitting: 18.4  . Smokeless tobacco: Never Used  Vaping Use  . Vaping Use: Never used  Substance and Sexual Activity  . Alcohol use: No  . Drug use: Never  . Sexual activity: Yes    Birth control/protection: Post-menopausal  Other Topics Concern  . Not on file  Social History Narrative  . Not on file   Social Determinants of Health   Financial Resource Strain: Not on file  Food Insecurity: Not on file  Transportation Needs: Not on file  Physical Activity: Not on file  Stress: Not on file  Social Connections: Not on file     Family History:  The patient's family history includes Alcoholism in her father; COPD in her mother; Diabetes in her father and another family member; Heart disease in an other family member; Pneumonia in her father.   ROS:   Please see the history of present illness.    ROS All other systems reviewed and are negative.   PHYSICAL EXAM:   VS:  BP 120/80 (BP Location: Left Arm, Patient Position: Sitting, Cuff Size: Normal)   Pulse 90   Ht _1  (1.575 m)   Wt  189 lb (85.7 kg)   SpO2 95%   BMI 34.57 kg/m   Physical Exam  GEN: Obese, in no acute distress  Neck: no JVD, carotid bruits, or masses Cardiac:irreg irreg; no murmurs, rubs, or gallops  Respiratory:  clear to auscultation bilaterally, normal work of breathing GI: soft, nontender, nondistended, + BS Ext: without cyanosis, clubbing, or edema, Good distal pulses bilaterally Neuro:  Alert and Oriented x 3, Strength and sensation are intact Psych: euthymic mood, full affect  Wt Readings from Last 3 Encounters:  01/28/21 189 lb (85.7 kg)  12/17/20 192 lb 9.6 oz (87.4 kg)  11/27/20 197 lb (89.4 kg)      Studies/Labs Reviewed:   EKG:  EKG is  ordered today.  The ekg ordered today demonstrates Afib 90/m  Recent Labs: 08/27/2020: Magnesium 1.9; TSH 0.860 10/02/2020: BUN 17; Creatinine, Ser 0.77; Hemoglobin 15.3; Platelets 277; Potassium 4.4; Sodium 137   Lipid Panel No results found for: CHOL, TRIG, HDL, CHOLHDL, VLDL, LDLCALC, LDLDIRECT  Additional studies/ records that were reviewed today include:  CO monitor 10/14/2020 Study Highlights   Persistent atrial fibrillation  (Burden 100%) V rates are frequently elevated with average HR of 102 bpm (range 62-185 bpm)   Echo-  08/28/20  Left ventricular ejection fraction, by estimation, is 60 to 65%. The  left ventricle has normal function. The left ventricle has no regional  wall motion abnormalities. Left ventricular diastolic function could not  be evaluated.   2. Right ventricular systolic function is normal. The right ventricular  size is normal. There is normal pulmonary artery systolic pressure. The  estimated right ventricular systolic pressure is 11.9 mmHg.   3. Left atrial size was mildly dilated.   4. A small pericardial effusion is present. The pericardial effusion is  posterior to the left ventricle. There is no evidence of cardiac  tamponade.   5. Hx of MV repair. Either an annuloplasty ring is present or mild MAC is   present. Unclear surgical details, but does not have typical appearance of  annuloplasty ring. The mitral valve has been repaired/replaced. Mild  mitral valve regurgitation. No  evidence of mitral stenosis.   6. The aortic valve is tricuspid. There is mild calcification of the  aortic valve. There is mild thickening of the aortic valve. Aortic valve  regurgitation is not visualized. Mild aortic valve sclerosis is present,  with no evidence of aortic valve  stenosis.   7. The inferior vena cava is normal in size with greater than 50%  respiratory variability, suggesting right atrial pressure of 3 mmHg.         Risk Assessment/Calculations:    CHA2DS2-VASc Score = 1  This indicates a 0.6% annual risk of stroke. The patient's score is based upon: CHF History: No HTN History: No Diabetes History: No Stroke History: No Vascular Disease History: No Age Score: 0 Gender Score: 1         ASSESSMENT:    1. Persistent atrial fibrillation (HCC)      PLAN:  In order of problems listed above:  Atrial fibrillation found at the time of the surgical procedure 08/2020.  Was placed on Eliquis and seen in the A. fib clinic for possible cardioversion.  Patient declined wanting to stay in atrial fibrillation heart rate was 100 that day so Zio patch was placed.  CHA2DS2-VASc equals 1 for female.  Zio monitor 10/14/2020 persistent atrial fibrillation ventricular rates frequently elevated with an average heart rate of 102 bpm but many fast rates.  Metoprolol recommended but patient declined. I placed the patient on diltiazem 180 mg daily.  Felt heart racing a little this am but similar to how she feels now and HR 90-first she's felt it in a long time. Still considering DCCV. Went over risks/benefits in detail. She'll call us after her beach trip if she wants to schedule.     History of "hole in her heart repaired at age 21" but never had mitral valve repair as echo indicates. 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 There is nothing to suggest prior VSD repair, but we would not see this as it is long healed up and likely graft.      Shared Decision Making/Informed Consent   Shared Decision Making/Informed Consent The risks (stroke, cardiac arrhythmias rarely resulting in the need for a temporary or permanent pacemaker, skin irritation or burns and complications associated with conscious sedation including aspiration, arrhythmia, respiratory failure and death), benefits (restoration of normal sinus rhythm) and alternatives of a direct current cardioversion were explained in detail to Ms. Belfield and she agrees to proceed.          Medication Adjustments/Labs and Tests Ordered: Current medicines are reviewed at length with the patient today.  Concerns regarding medicines are outlined above.  Medication changes, Labs and Tests ordered today are listed in the Patient Instructions below. Patient Instructions  Medication Instructions:  Your physician recommends that you continue on your current medications as directed. Please refer to the Current Medication list given to you today.  *If you need a refill on your cardiac medications before your next appointment, please call your pharmacy*   Lab Work: none If you have labs (blood work) drawn today and your tests are completely normal, you will receive your results only by: Marland Kitchen MyChart Message (if you have MyChart) OR . A paper copy in the mail If you have any lab test that is abnormal or we need to change your treatment, we will call you to review the results.   Testing/Procedures: none   Follow-Up: At [facility], you and your health needs are our priority.  As part of our continuing mission to provide you with exceptional heart care, we have created designated Provider Care Teams.  These Care  Teams include your primary Cardiologist (physician) and Advanced Practice Providers (APPs -  Physician Assistants and Nurse Practitioners) who all work together to provide you with the care you need, when you need it.  We recommend signing up for the patient portal called "MyChart".  Sign up information is provided on this After Visit Summary.  MyChart is used to connect with patients for Virtual Visits (Telemedicine).  Patients are able to view lab/test results, encounter notes, upcoming appointments, etc.  Non-urgent messages can be sent to your provider as well.   To learn more about what you can do with MyChart, go to [email or web address].    Your next appointment:   3 month(s)  The format for your next appointment:   In Person  Provider:   Gwyndolyn Kaufman, MD   Other Instructions      Signed, Ermalinda Barrios, PA-C  01/28/2021 12:20 PM    [address], [place], [place]  [address] Phone: [phone]; Fax: [phone]

## 2021-01-27 ENCOUNTER — Other Ambulatory Visit (HOSPITAL_COMMUNITY): Payer: Self-pay | Admitting: Obstetrics and Gynecology

## 2021-01-27 ENCOUNTER — Other Ambulatory Visit (HOSPITAL_COMMUNITY): Payer: Self-pay | Admitting: Internal Medicine

## 2021-01-27 DIAGNOSIS — Z1231 Encounter for screening mammogram for malignant neoplasm of breast: Secondary | ICD-10-CM

## 2021-01-28 ENCOUNTER — Ambulatory Visit (INDEPENDENT_AMBULATORY_CARE_PROVIDER_SITE_OTHER): Payer: PRIVATE HEALTH INSURANCE | Admitting: Physician Assistant

## 2021-01-28 ENCOUNTER — Other Ambulatory Visit: Payer: Self-pay

## 2021-01-28 ENCOUNTER — Encounter: Payer: Self-pay | Admitting: Physician Assistant

## 2021-01-28 VITALS — BP 120/80 | HR 90 | Ht 62.0 in | Wt 189.0 lb

## 2021-01-28 DIAGNOSIS — I4819 Other persistent atrial fibrillation: Secondary | ICD-10-CM

## 2021-01-28 MED ORDER — APIXABAN 5 MG PO TABS: 5.0000 mg | ORAL_TABLET | Freq: Two times a day (BID) | ORAL | 3 refills | Status: DC

## 2021-01-28 NOTE — Patient Instructions (Signed)
Medication Instructions:  Your physician recommends that you continue on your current medications as directed. Please refer to the Current Medication list given to you today.  *If you need a refill on your cardiac medications before your next appointment, please call your pharmacy*   Lab Work: none If you have labs (blood work) drawn today and your tests are completely normal, you will receive your results only by: Marland Kitchen MyChart Message (if you have MyChart) OR . A paper copy in the mail If you have any lab test that is abnormal or we need to change your treatment, we will call you to review the results.   Testing/Procedures: none   Follow-Up: At [facility], you and your health needs are our priority.  As part of our continuing mission to provide you with exceptional heart care, we have created designated Provider Care Teams.  These Care Teams include your primary Cardiologist (physician) and Advanced Practice Providers (APPs -  Physician Assistants and Nurse Practitioners) who all work together to provide you with the care you need, when you need it.  We recommend signing up for the patient portal called "MyChart".  Sign up information is provided on this After Visit Summary.  MyChart is used to connect with patients for Virtual Visits (Telemedicine).  Patients are able to view lab/test results, encounter notes, upcoming appointments, etc.  Non-urgent messages can be sent to your provider as well.   To learn more about what you can do with MyChart, go to [email or web address].    Your next appointment:   3 month(s)  The format for your next appointment:   In Person  Provider:   Gwyndolyn Kaufman, MD   Other Instructions

## 2021-01-31 ENCOUNTER — Other Ambulatory Visit: Payer: Self-pay

## 2021-01-31 ENCOUNTER — Ambulatory Visit (HOSPITAL_COMMUNITY)
Admission: RE | Admit: 2021-01-31 | Discharge: 2021-01-31 | Disposition: A | Discharge status: Home / Self Care | Payer: PRIVATE HEALTH INSURANCE | Source: Ambulatory Visit | Attending: Obstetrics and Gynecology | Admitting: Obstetrics and Gynecology

## 2021-01-31 DIAGNOSIS — Z1231 Encounter for screening mammogram for malignant neoplasm of breast: Secondary | ICD-10-CM | POA: Diagnosis not present

## 2021-04-28 NOTE — Progress Notes (Deleted)
Cardiology Office Note    Date:  04/28/2021   ID:  [name], DOB Nov 27, 1960, MRN [id number]   PCP:  [facility], [place]  Cardiologist:  None *** Advanced Practice Provider:  No care team member to display Electrophysiologist:  None   [id number]}   No chief complaint on file.   History of Present Illness:  [name] is a 60 y.o. female who had new onset atrial fibrillation with RVR 08/2020 in the setting of anesthesia for D&C.  She has a history of what sounds like VSD repair at 60 yr old but echo mentions some type of mitral valve repair which patient says she never had.  CHA2DS2-VASc equals 1 for age.  She was placed on Eliquis and sent to the A. fib clinic but declined cardioversion.  She wanted to stay on Eliquis.  Zio monitor showed average heart rate 105 with some rapid heart rates.  Metoprolol recommended but patient declined.     Patient was initially not interested in cardioversion but at follow-up 01/28/2021 she was reconsidering having it done was going to call us if she wanted to proceed.   Past Medical History:  Diagnosis Date   Arthritis    Cancer ([place])    Skin on left hand   DDD (degenerative disc disease), lumbar    DDD (degenerative disc disease), lumbosacral    GERD (gastroesophageal reflux disease)    Uses OTC medication as needed   History of avascular necrosis of capital femoral epiphysis    s/p THA right   History of mitral valve prolapse    per pt previous has seen cardiologist , dr gamble , yrs ago--- echo scanned in epic dated 04-22-2007 (scanned on dated 06-20-2010)  shows no evidence mvp, ef >55%, trace MR/TR and mild AV sclerosis but no stenosis or regurg.   History of vulvar dysplasia    VIN3   Lumbar spondylosis    OA (osteoarthritis)    PMB (postmenopausal bleeding)    Scoliosis    Wears glasses     Past Surgical History:  Procedure Laterality Date   ANTERIOR CERVICAL DECOMP/DISCECTOMY  FUSION  11-25-2009  _0    C4 --- C7   ANTERIOR LAT LUMBAR FUSION Left 10/22/2016   Procedure: Anterior Lateral Lumbar Fusion - Lumbar two - lumbar three, with Lateral plate;  Surgeon: Kary Kos, MD;  Location: [facility];  Service: Neurosurgery;  Laterality: Left;  Anterior Lateral Lumbar Fusion - Lumbar two - lumbar three, with Lateral plate   CARDIAC CATHETERIZATION  age 70   CO2 LASER APPLICATION N/A 93/73/4287   Procedure: CO2 LASER APPLICATION;  Surgeon: Gus Height, MD;  Location: [facility];  Service: Gynecology;  Laterality: N/A;   COLONOSCOPY W/ POLYPECTOMY     HYSTEROSCOPY WITH D & C N/A 08/27/2020   Procedure: DILATATION AND CURETTAGE /HYSTEROSCOPY;  Surgeon: Vanessa Kick, MD;  Location: [facility];  Service: Gynecology;  Laterality: N/A;   LAPAROSCOPIC CHOLECYSTECTOMY  04/2006   LEG SURGERY Right 1983   femoral artery repair from cardiac cath injury    OPEN REDUCTION INTERNAL FIXATION (ORIF) METACARPAL Right 08/12/2017   Procedure: OPEN REDUCTION INTERNAL FIXATION (ORIF) 5th METACARPAL fracture;  Surgeon: Roseanne Kaufman, MD;  Location: [facility];  Service: Orthopedics;  Laterality: Right;  60 mins   SPINAL FIXATION SURGERY  1973   age 27 - sclosis   TOTAL HIP ARTHROPLASTY Right 06-16-2010 _1    UPPER GASTROINTESTINAL ENDOSCOPY  VSD REPAIR  age 74   VULVECTOMY N/A 09/18/2015   Procedure: WIDE EXCISION VULVECTOMY;  Surgeon: Vanessa Kick, MD;  Location: [facility];  Service: Gynecology;  Laterality: N/A;    Current Medications: No outpatient medications have been marked as taking for the 04/30/21 encounter (Appointment) with Imogene Burn, PA-C.     Allergies:   Patient has no active allergies.   Social History   Socioeconomic History   Marital status: Married    Spouse name: Not on file   Number of children: Not on file   Years of education: Not on file   Highest education level: Not on file  Occupational History   Occupation: [employer]  Tobacco Use   Smoking  status: Former    Packs/day: 1.00    Years: 20.00    Pack years: 20.00    Types: Cigarettes    Quit date: 09/07/2002    Years since quitting: 18.6   Smokeless tobacco: Never  Vaping Use   Vaping Use: Never used  Substance and Sexual Activity   Alcohol use: No   Drug use: Never   Sexual activity: Yes    Birth control/protection: Post-menopausal  Other Topics Concern   Not on file  Social History Narrative   Not on file   Social Determinants of Health   Financial Resource Strain: Not on file  Food Insecurity: Not on file  Transportation Needs: Not on file  Physical Activity: Not on file  Stress: Not on file  Social Connections: Not on file     Family History:  The patient's ***family history includes Alcoholism in her father; COPD in her mother; Diabetes in her father and another family member; Heart disease in an other family member; Pneumonia in her father.   ROS:   Please see the history of present illness.    ROS All other systems reviewed and are negative.   PHYSICAL EXAM:   VS:  There were no vitals taken for this visit.  Physical Exam  GEN: Well nourished, well developed, in no acute distress  HEENT: normal  Neck: no JVD, carotid bruits, or masses Cardiac:RRR; no murmurs, rubs, or gallops  Respiratory:  clear to auscultation bilaterally, normal work of breathing GI: soft, nontender, nondistended, + BS Ext: without cyanosis, clubbing, or edema, Good distal pulses bilaterally MS: no deformity or atrophy  Skin: warm and dry, no rash Neuro:  Alert and Oriented x 3, Strength and sensation are intact Psych: euthymic mood, full affect  Wt Readings from Last 3 Encounters:  01/28/21 189 lb (85.7 kg)  12/17/20 192 lb 9.6 oz (87.4 kg)  11/27/20 197 lb (89.4 kg)      Studies/Labs Reviewed:   EKG:  EKG is*** ordered today.  The ekg ordered today demonstrates ***  Recent Labs: 08/27/2020: Magnesium 1.9; TSH 0.860 10/02/2020: BUN 17; Creatinine, Ser 0.77;  Hemoglobin 15.3; Platelets 277; Potassium 4.4; Sodium 137   Lipid Panel No results found for: CHOL, TRIG, HDL, CHOLHDL, VLDL, LDLCALC, LDLDIRECT  Additional studies/ records that were reviewed today include:  CO monitor 10/14/2020 Study Highlights   Persistent atrial fibrillation  (Burden 100%) V rates are frequently elevated with average HR of 102 bpm (range 62-185 bpm)   Echo-  08/28/20  Left ventricular ejection fraction, by estimation, is 60 to 65%. The  left ventricle has normal function. The left ventricle has no regional  wall motion abnormalities. Left ventricular diastolic function could not  be evaluated.   2. Right ventricular systolic function  is normal. The right ventricular  size is normal. There is normal pulmonary artery systolic pressure. The  estimated right ventricular systolic pressure is 42.7 mmHg.   3. Left atrial size was mildly dilated.   4. A small pericardial effusion is present. The pericardial effusion is  posterior to the left ventricle. There is no evidence of cardiac  tamponade.   5. Hx of MV repair. Either an annuloplasty ring is present or mild MAC is  present. Unclear surgical details, but does not have typical appearance of  annuloplasty ring. The mitral valve has been repaired/replaced. Mild  mitral valve regurgitation. No  evidence of mitral stenosis.   6. The aortic valve is tricuspid. There is mild calcification of the  aortic valve. There is mild thickening of the aortic valve. Aortic valve  regurgitation is not visualized. Mild aortic valve sclerosis is present,  with no evidence of aortic valve  stenosis.   7. The inferior vena cava is normal in size with greater than 50%  respiratory variability, suggesting right atrial pressure of 3 mmHg.        Risk Assessment/Calculations:   {Does this patient have ATRIAL FIBRILLATION?:[phone]}     ASSESSMENT:    1. Persistent atrial fibrillation (HCC)      PLAN:  In order of problems  listed above:  Persistent atrial fibrillation initially found at the time of the surgical procedure 08/2020 CHA2DS2-VASc equals 1.  She was placed on Eliquis for possible cardioversion but this was never done.  ZIO monitor 10/14/2020 persistent atrial fibrillation with frequently elevated ventricular rates.  Metoprolol recommended but patient declined.  He was eventually started on diltiazem   Shared Decision Making/Informed Consent   {Are you ordering a CV Procedure (e.g. stress test, cath, DCCV, TEE, etc)?   Press F2        :[id number]}    Medication Adjustments/Labs and Tests Ordered: Current medicines are reviewed at length with the patient today.  Concerns regarding medicines are outlined above.  Medication changes, Labs and Tests ordered today are listed in the Patient Instructions below. There are no Patient Instructions on file for this visit.   Sumner Boast, PA-C  04/28/2021 2:37 PM    [facility] [address], [place], [place]  [address] Phone: [phone]; Fax: [phone]

## 2021-04-30 ENCOUNTER — Ambulatory Visit: Payer: No Typology Code available for payment source | Admitting: Physician Assistant

## 2021-04-30 DIAGNOSIS — I4819 Other persistent atrial fibrillation: Secondary | ICD-10-CM

## 2021-05-08 ENCOUNTER — Ambulatory Visit
Admission: EM | Admit: 2021-05-08 | Discharge: 2021-05-08 | Disposition: A | Discharge status: Home / Self Care | Payer: No Typology Code available for payment source | Attending: Emergency Medicine | Admitting: Emergency Medicine

## 2021-05-08 ENCOUNTER — Other Ambulatory Visit: Payer: Self-pay

## 2021-05-08 ENCOUNTER — Encounter: Payer: Self-pay | Admitting: *Deleted

## 2021-05-08 DIAGNOSIS — R350 Frequency of micturition: Secondary | ICD-10-CM | POA: Diagnosis not present

## 2021-05-08 LAB — POCT URINALYSIS DIP (MANUAL ENTRY)
Glucose, UA: NEGATIVE mg/dL
Leukocytes, UA: NEGATIVE
Nitrite, UA: NEGATIVE
Protein Ur, POC: 100 mg/dL — AB
Spec Grav, UA: 1.02 (ref 1.010–1.025)
Urobilinogen, UA: 2 E.U./dL — AB
pH, UA: 6 (ref 5.0–8.0)

## 2021-05-08 MED ORDER — NITROFURANTOIN MONOHYD MACRO 100 MG PO CAPS: 100.0000 mg | ORAL_CAPSULE | Freq: Two times a day (BID) | ORAL | 0 refills | Status: DC

## 2021-05-08 MED ORDER — PHENAZOPYRIDINE HCL 200 MG PO TABS: 200.0000 mg | ORAL_TABLET | Freq: Three times a day (TID) | ORAL | 0 refills | Status: DC

## 2021-05-08 NOTE — ED Provider Notes (Signed)
[facility]   CC: Burning with urination  SUBJECTIVE:  [name] is a 60 y.o. female who complains of back pain, frequency, and dysuria x 1 week  Admits to intercourse with spouse.  Localizes the pain to the low back  Has tried OTC medications without relief.  Symptoms are made worse with urination.  Admits to similar symptoms in the past.  Reports nausea, and chills.  Denies fever, vomiting, abdominal pain, flank pain, hematuria.    LMP: No LMP recorded. Patient is postmenopausal.  ROS: As in HPI.  All other pertinent ROS negative.     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   History of vulvar dysplasia    VIN3   Lumbar spondylosis    OA (osteoarthritis)    PMB (postmenopausal bleeding)    Scoliosis    Wears glasses    Past Surgical History:  Procedure Laterality Date   ANTERIOR CERVICAL DECOMP/DISCECTOMY FUSION  11-25-2009  '@[facility]'$    C4 --- C7   ANTERIOR LAT LUMBAR FUSION Left 10/22/2016   Procedure: Anterior Lateral Lumbar Fusion - Lumbar two - lumbar three, with Lateral plate;  Surgeon: Kary Kos, MD;  Location: [facility];  Service: Neurosurgery;  Laterality: Left;  Anterior Lateral Lumbar Fusion - Lumbar two - lumbar three, with Lateral plate   CARDIAC CATHETERIZATION  age 25   CO2 LASER APPLICATION N/A [id number]   Procedure: CO2 LASER APPLICATION;  Surgeon: Gus Height, MD;  Location: [facility];  Service: Gynecology;   Laterality: N/A;   COLONOSCOPY W/ POLYPECTOMY     HYSTEROSCOPY WITH D & C N/A 08/27/2020   Procedure: DILATATION AND CURETTAGE /HYSTEROSCOPY;  Surgeon: Vanessa Kick, MD;  Location: [facility];  Service: Gynecology;  Laterality: N/A;   LAPAROSCOPIC CHOLECYSTECTOMY  04/2006   LEG SURGERY Right 1983   femoral artery repair from cardiac cath injury    OPEN REDUCTION INTERNAL FIXATION (ORIF) METACARPAL Right 08/12/2017   Procedure: OPEN REDUCTION INTERNAL FIXATION (ORIF) 5th METACARPAL fracture;  Surgeon: Roseanne Kaufman, MD;  Location: [facility];  Service: Orthopedics;  Laterality: Right;  60 mins   SPINAL FIXATION SURGERY  1973   age 82 - sclosis   TOTAL HIP ARTHROPLASTY Right 06-16-2010 '@[facility]'$    UPPER GASTROINTESTINAL ENDOSCOPY     VSD REPAIR  age 70   VULVECTOMY N/A 09/18/2015   Procedure: WIDE EXCISION VULVECTOMY;  Surgeon: Vanessa Kick, MD;  Location: [facility];  Service: Gynecology;  Laterality: N/A;   No Known Allergies No current facility-administered medications on file prior to encounter.   Current Outpatient Medications on File Prior to Encounter  Medication Sig Dispense Refill   apixaban (ELIQUIS) 5 MG TABS tablet Take 1 tablet (5 mg total) by mouth 2 (two) times daily. 60 tablet 3   Collagen-Vitamin C-Biotin (COLLAGEN 1500/C PO) Take 3 capsules by mouth daily.  diltiazem (CARDIZEM CD) 180 MG 24 hr capsule Take 1 capsule (180 mg total) by mouth daily. 90 capsule 2   estrogens-methylTEST 1.25-2.5 MG TABS tablet Take 1 tablet by mouth daily.     medroxyPROGESTERone (PROVERA) 5 MG tablet Take 5 mg by mouth daily.     meloxicam (MOBIC) 15 MG tablet Take 15 mg by mouth daily.     OVER THE COUNTER MEDICATION Take 2 capsules by mouth daily. Zinc, vit C and Vit D     traMADol (ULTRAM) 50 MG tablet Take 100 mg by mouth every morning.     Social History   Socioeconomic History   Marital status: Married    Spouse name: Not on file   Number of children: Not on file   Years of  education: Not on file   Highest education level: Not on file  Occupational History   Occupation: [employer]  Tobacco Use   Smoking status: Former    Packs/day: 1.00    Years: 20.00    Pack years: 20.00    Types: Cigarettes    Quit date: 09/07/2002    Years since quitting: 18.6   Smokeless tobacco: Never  Vaping Use   Vaping Use: Never used  Substance and Sexual Activity   Alcohol use: No   Drug use: Never   Sexual activity: Yes    Birth control/protection: Post-menopausal  Other Topics Concern   Not on file  Social History Narrative   Not on file   Social Determinants of Health   Financial Resource Strain: Not on file  Food Insecurity: Not on file  Transportation Needs: Not on file  Physical Activity: Not on file  Stress: Not on file  Social Connections: Not on file  Intimate Partner Violence: Not on file   Family History  Problem Relation Age of Onset   Diabetes Other    Heart disease Other    COPD Mother    Diabetes Father    Pneumonia Father    Alcoholism Father    Colon cancer Neg Hx     OBJECTIVE:  Vitals:   05/08/21 1024  BP: 117/79  Pulse: 87  Resp: 18  Temp: 99 F (37.2 C)  SpO2: 94%   General appearance: AOx3 in no acute distress HEENT: NCAT.  Oropharynx clear.  Lungs: clear to auscultation bilaterally without adventitious breath sounds Heart: regular rate and rhythm.   Abdomen: soft; non-distended; no tenderness; bowel sounds present; no guarding or rebound tenderness Back: no CVA tenderness Extremities: no edema; symmetrical with no gross deformities Skin: warm and dry Neurologic: Ambulates from chair to exam table without difficulty Psychological: alert and cooperative; normal mood and affect  Labs Reviewed  POCT URINALYSIS DIP (MANUAL ENTRY) - Abnormal; Notable for the following components:      Result Value   Bilirubin, UA moderate (*)    Ketones, POC UA small (15) (*)    Blood, UA small (*)    Protein Ur, POC =100 (*)     Urobilinogen, UA 2.0 (*)    All other components within normal limits    ASSESSMENT & PLAN:  1. Urinary frequency     Meds ordered this encounter  Medications   nitrofurantoin, macrocrystal-monohydrate, (MACROBID) 100 MG capsule    Sig: Take 1 capsule (100 mg total) by mouth 2 (two) times daily.    Dispense:  10 capsule    Refill:  0    Order Specific Question:   Supervising Provider    Answer:  Blanchie Serve SUE [id number]   phenazopyridine (PYRIDIUM) 200 MG tablet    Sig: Take 1 tablet (200 mg total) by mouth 3 (three) times daily.    Dispense:  6 tablet    Refill:  0    Order Specific Question:   Supervising Provider    Answer:   Raylene Everts [id number]   Urine concerning for infection Urine culture sent.  We will call you with the results.   Push fluids and get plenty of rest.   Take antibiotic as directed and to completion Take pyridium as prescribed and as needed for symptomatic relief Follow up with PCP if symptoms persists Return here or go to ER if you have any new or worsening symptoms such as fever, worsening abdominal pain, nausea/vomiting, flank pain, etc...  Outlined signs and symptoms indicating need for more acute intervention. Patient verbalized understanding. After Visit Summary given.      Lestine Box, PA-C 05/08/21 1045

## 2021-05-08 NOTE — ED Triage Notes (Signed)
Pt reports lower back pain and urinary frequency for over 1 week.

## 2021-05-08 NOTE — Discharge Instructions (Addendum)
Urine concerning for infection Urine culture sent.  We will call you with the results.   Push fluids and get plenty of rest.   Take antibiotic as directed and to completion Take pyridium as prescribed and as needed for symptomatic relief Follow up with PCP if symptoms persists Return here or go to ER if you have any new or worsening symptoms such as fever, worsening abdominal pain, nausea/vomiting, flank pain, etc..Marland Kitchen

## 2021-05-20 ENCOUNTER — Encounter: Payer: Self-pay | Admitting: Internal Medicine

## 2021-07-16 ENCOUNTER — Ambulatory Visit (AMBULATORY_SURGERY_CENTER): Payer: No Typology Code available for payment source | Admitting: *Deleted

## 2021-07-16 ENCOUNTER — Telehealth: Payer: Self-pay | Admitting: *Deleted

## 2021-07-16 ENCOUNTER — Other Ambulatory Visit: Payer: Self-pay

## 2021-07-16 VITALS — Ht 62.0 in | Wt 189.0 lb

## 2021-07-16 DIAGNOSIS — Z86010 Personal history of colonic polyps: Secondary | ICD-10-CM

## 2021-07-16 MED ORDER — NA SULFATE-K SULFATE-MG SULF 17.5-3.13-1.6 GM/177ML PO SOLN: 1.0000 | ORAL | 0 refills | Status: DC

## 2021-07-16 NOTE — Telephone Encounter (Signed)
Reviewed. Okay for direct colonoscopy in [place], as she is now off blood thinner. Thanks

## 2021-07-16 NOTE — Telephone Encounter (Signed)
Please review recent cardiology notes. Patient is no longer taking Eliquis. She denies any cardiac symptoms or SOB. [place] for [employer]? Please advise. Thank you, Shammond Arave pv

## 2021-07-17 ENCOUNTER — Encounter: Payer: Self-pay | Admitting: Internal Medicine

## 2021-07-17 NOTE — Telephone Encounter (Signed)
Noted  

## 2021-08-07 ENCOUNTER — Encounter: Payer: Self-pay | Admitting: Internal Medicine

## 2021-08-07 ENCOUNTER — Encounter: Payer: No Typology Code available for payment source | Admitting: Internal Medicine

## 2021-08-07 ENCOUNTER — Ambulatory Visit (AMBULATORY_SURGERY_CENTER): Payer: No Typology Code available for payment source | Admitting: Internal Medicine

## 2021-08-07 ENCOUNTER — Other Ambulatory Visit: Payer: Self-pay

## 2021-08-07 VITALS — BP 122/69 | HR 82 | Temp 98.3°F | Resp 12 | Ht 62.0 in | Wt 189.0 lb

## 2021-08-07 DIAGNOSIS — Z86010 Personal history of colonic polyps: Secondary | ICD-10-CM

## 2021-08-07 MED ORDER — SODIUM CHLORIDE 0.9 % IV SOLN: 500.0000 mL | INTRAVENOUS | Status: DC

## 2021-08-07 NOTE — Patient Instructions (Signed)
You may experience a sore throat.  Do use salt water gargles.  Call us if the throat pain gets worse. Try gas X if the bloating gets worse.  Read all of the handouts givent o you by your recovery room nurse.  YOU HAD AN ENDOSCOPIC PROCEDURE TODAY AT [facility]:   Refer to the procedure report that was given to you for any specific questions about what was found during the examination.  If the procedure report does not answer your questions, please call your gastroenterologist to clarify.  If you requested that your care partner not be given the details of your procedure findings, then the procedure report has been included in a sealed envelope for you to review at your convenience later.  YOU SHOULD EXPECT: Some feelings of bloating in the abdomen. Passage of more gas than usual.  Walking can help get rid of the air that was put into your GI tract during the procedure and reduce the bloating. If you had a lower endoscopy (such as a colonoscopy or flexible sigmoidoscopy) you may notice spotting of blood in your stool or on the toilet paper. If you underwent a bowel prep for your procedure, you may not have a normal bowel movement for a few days.  Please Note:  You might notice some irritation and congestion in your nose or some drainage.  This is from the oxygen used during your procedure.  There is no need for concern and it should clear up in a day or so.  SYMPTOMS TO REPORT IMMEDIATELY:  Following lower endoscopy (colonoscopy or flexible sigmoidoscopy):   Excessive amounts of blood in the stool  Significant tenderness or worsening of abdominal pains  Swelling of the abdomen that is new, acute  Fever of 100F or higher   For urgent or emergent issues, a gastroenterologist can be reached at any hour by calling [phone]. Do not use MyChart messaging for urgent concerns.    DIET:  We do recommend a small meal at first, but then you may proceed to your regular diet.  Drink  plenty of fluids but you should avoid alcoholic beverages for 24 hours. Try to increase the fiber in your diet, and drink plenty of water.  ACTIVITY:  You should plan to take it easy for the rest of today and you should NOT DRIVE or use heavy machinery until tomorrow (because of the sedation medicines used during the test).    FOLLOW UP: Our staff will call the number listed on your records 48-72 hours following your procedure to check on you and address any questions or concerns that you may have regarding the information given to you following your procedure. If we do not reach you, we will leave a message.  We will attempt to reach you two times.  During this call, we will ask if you have developed any symptoms of COVID 19. If you develop any symptoms (ie: fever, flu-like symptoms, shortness of breath, cough etc.) before then, please call [phone].  If you test positive for Covid 19 in the 2 weeks post procedure, please call and report this information to [place].     SIGNATURES/CONFIDENTIALITY: You and/or your care partner have signed paperwork which will be entered into your electronic medical record.  These signatures attest to the fact that that the information above on your After Visit Summary has been reviewed and is understood.  Full responsibility of the confidentiality of this discharge information lies with you and/or your care-partner.

## 2021-08-07 NOTE — Progress Notes (Signed)
Pt's states no medical or surgical changes since previsit or office visit.   AG Iv and CW vitals.

## 2021-08-07 NOTE — Op Note (Signed)
[place] Patient Name: [name] Procedure Date: 08/07/2021 8:04 AM MRN: [id number] Endoscopist: Docia Chuck. Henrene Pastor , MD Age: 60 Referring MD:  Date of Birth: 07/25/1961 Gender: Female Account #: [id number] Procedure:                Colonoscopy Indications:              High risk colon cancer surveillance: Personal                            history of multiple (3 or more) adenomas. Prior                            exams 2013, 2018 Medicines:                Monitored Anesthesia Care Procedure:                Pre-Anesthesia Assessment:                           - Prior to the procedure, a History and Physical                            was performed, and patient medications and                            allergies were reviewed. The patient's tolerance of                            previous anesthesia was also reviewed. The risks                            and benefits of the procedure and the sedation                            options and risks were discussed with the patient.                            All questions were answered, and informed consent                            was obtained. Prior Anticoagulants: The patient has                            taken no previous anticoagulant or antiplatelet                            agents. ASA Grade Assessment: II - A patient with                            mild systemic disease. After reviewing the risks                            and benefits, the patient was deemed in  satisfactory condition to undergo the procedure.                           After obtaining informed consent, the colonoscope                            was passed under direct vision. Throughout the                            procedure, the patient's blood pressure, pulse, and                            oxygen saturations were monitored continuously. The                            Olympus CF-HQ190L ([id number]) Colonoscope was                             introduced through the anus and advanced to the the                            cecum, identified by appendiceal orifice and                            ileocecal valve. The ileocecal valve, appendiceal                            orifice, and rectum were photographed. The quality                            of the bowel preparation was excellent. The                            colonoscopy was performed without difficulty. The                            patient tolerated the procedure well. The bowel                            preparation used was SUPREP via split dose                            instruction. Scope In: 8:29:38 AM Scope Out: 8:46:20 AM Scope Withdrawal Time: 0 hours 11 minutes 24 seconds  Total Procedure Duration: 0 hours 16 minutes 42 seconds  Findings:                 Multiple diverticula were found in the left colon                            and right colon.                           The exam was otherwise without abnormality on  direct and retroflexion views. Complications:            No immediate complications. Estimated blood loss:                            None. Estimated Blood Loss:     Estimated blood loss: none. Impression:               - Diverticulosis in the left colon and in the right                            colon.                           - The examination was otherwise normal on direct                            and retroflexion views.                           - No specimens collected. Recommendation:           - Repeat colonoscopy in 5 years for surveillance.                           - Patient has a contact number available for                            emergencies. The signs and symptoms of potential                            delayed complications were discussed with the                            patient. Return to normal activities tomorrow.                            Written discharge instructions  were provided to the                            patient.                           - Resume previous diet.                           - Continue present medications. Docia Chuck. Henrene Pastor, MD 08/07/2021 8:55:01 AM This report has been signed electronically.

## 2021-08-07 NOTE — Progress Notes (Signed)
Approx 0845 pt started wretching and coughing.  Bile colored fluid noted at mouth and nose.  HOB dropped to steep t-burg and suction started.  Pt did starte to de sat as seen below.  Jaw thrust preformed did not immediately improve so 100mg  of lido given and pt spasm broken.  Pt allowed to wake up in procedure room.  Pt complains of throat burning and stuffy nose.  Initial BS clear.  Situation explained to PACU RN, pt and pt care partner of what happened and what to watch for.  Dr Henrene Pastor aware and available

## 2021-08-11 ENCOUNTER — Telehealth: Payer: Self-pay

## 2021-08-11 NOTE — Telephone Encounter (Signed)
  Follow up Call-  Call back number 08/07/2021  Post procedure Call Back phone  # [phone]  Permission to leave phone message Yes  Some recent data might be hidden     Patient questions:  Do you have a fever, pain , or abdominal swelling? No. Pain Score  0 *  Have you tolerated food without any problems? Yes.    Have you been able to return to your normal activities? Yes.    Do you have any questions about your discharge instructions: Diet   No. Medications  No. Follow up visit  No.  Do you have questions or concerns about your Care? Yes.    Coughing up fluid and has feels throat is not back to normal.  States she "aspirated on the table" and wants Dr Henrene Pastor and no one else, refuses to have anyone else call her. Actions: * If pain score is 4 or above: Physician/ provider Notified : Scarlette Shorts, MD.  Have you developed a fever since your procedure? no  2.   Have you had an respiratory symptoms (SOB or cough) since your procedure? yes  3.   Have you tested positive for COVID 19 since your procedure no  4.   Have you had any family members/close contacts diagnosed with the COVID 19 since your procedure?  no   If yes to any of these questions please route to Joylene John, RN and Joella Prince, RN

## 2021-08-26 NOTE — Progress Notes (Signed)
Cardiology Office Note    Date:  09/09/2021   ID:  [name], DOB [birth year], MRN [id number]   PCP:  [facility], [place]  Cardiologist:  None   Advanced Practice Provider:  No care team member to display Electrophysiologist:  None   [id number]}   Chief Complaint  Patient presents with   Follow-up    History of Present Illness:  [name] is a 60 y.o. female who had new onset atrial fibrillation with RVR 08/2020 in the setting of anesthesia for D&C.  She has a history of what sounds like VSD repair at 60 yr old but echo mentions some type of mitral valve repair which patient says she never had.  CHA2DS2-VASc equals 1 for age.  She was placed on Eliquis and sent to the A. fib clinic but declined cardioversion.  She wanted to stay on Eliquis.  Zio monitor showed average heart rate 105 with some rapid heart rates.  Metoprolol recommended but patient declined.     Patient was initially not interested in cardioversion but at follow-up 01/28/2021 she was reconsidering having it done was going to call us if she wanted to proceed.  Patient comes in for f/u. She got an apple watch and at times shows Afib but not all the time. Denies chest pain, dyspnea, palpitations, edema. Lost 60 lbs on optivia but gained 8 lbs back when she went off it. Not exercising but going to start.     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   History of vulvar dysplasia    VIN3   Lumbar spondylosis    OA (osteoarthritis)    PMB (postmenopausal bleeding)    Scoliosis    Wears glasses     Past Surgical History:  Procedure Laterality Date   ANTERIOR CERVICAL DECOMP/DISCECTOMY FUSION  11-25-2009  _0    C4 --- C7   ANTERIOR LAT LUMBAR FUSION Left 10/22/2016   Procedure: Anterior Lateral Lumbar Fusion - Lumbar two - lumbar three, with Lateral plate;  Surgeon: Kary Kos, MD;  Location: [facility];  Service: Neurosurgery;  Laterality: Left;  Anterior Lateral Lumbar Fusion - Lumbar two - lumbar three, with Lateral plate   CARDIAC CATHETERIZATION  age 67   CO2 LASER APPLICATION N/A 44/31/5400   Procedure: CO2 LASER APPLICATION;  Surgeon: Gus Height, MD;  Location: [facility];  Service: Gynecology;  Laterality: N/A;   COLONOSCOPY W/ POLYPECTOMY  06/21/2017   Dr.Perry   HYSTEROSCOPY WITH D & C N/A 08/27/2020   Procedure: DILATATION AND CURETTAGE /HYSTEROSCOPY;  Surgeon: Vanessa Kick, MD;  Location: [facility];  Service: Gynecology;  Laterality: N/A;   LAPAROSCOPIC CHOLECYSTECTOMY  04/2006   LEG SURGERY Right 1983   femoral artery repair from cardiac cath injury    OPEN REDUCTION INTERNAL FIXATION (ORIF) METACARPAL  Right 08/12/2017   Procedure: OPEN REDUCTION INTERNAL FIXATION (ORIF) 5th METACARPAL fracture;  Surgeon: Roseanne Kaufman, MD;  Location: [facility];  Service: Orthopedics;  Laterality: Right;  60 mins   SPINAL FIXATION SURGERY  1973   age 89 - sclosis   TOTAL HIP ARTHROPLASTY Right 06-16-2010 _0    UPPER GASTROINTESTINAL ENDOSCOPY     VSD REPAIR  age 58   VULVECTOMY N/A 09/18/2015   Procedure: WIDE EXCISION VULVECTOMY;  Surgeon: Vanessa Kick, MD;  Location: [facility];  Service: Gynecology;  Laterality: N/A;    Current Medications: Current Meds  Medication Sig   Collagen-Vitamin C-Biotin (COLLAGEN 1500/C PO) Take 3 capsules by mouth daily.   estrogens-methylTEST 1.25-2.5 MG TABS tablet Take  1 tablet by mouth daily.   fluticasone (FLONASE) 50 MCG/ACT nasal spray fluticasone propionate 50 mcg/actuation nasal spray,suspension  SHAKE LIQUID AND USE 1 SPRAY IN EACH   medroxyPROGESTERone (PROVERA) 5 MG tablet Take 5 mg by mouth daily.   meloxicam (MOBIC) 15 MG tablet Take 15 mg by mouth daily.   OVER THE COUNTER MEDICATION Take 2 capsules by mouth daily. Zinc, vit C and Vit D   traMADol (ULTRAM) 50 MG tablet Take 100 mg by mouth every morning.   [DISCONTINUED] diltiazem (CARDIZEM CD) 180 MG 24 hr capsule Take 1 capsule (180 mg total) by mouth daily.     Allergies:   Patient has no known allergies.   Social History   Socioeconomic History   Marital status: Married    Spouse name: Not on file   Number of children: Not on file   Years of education: Not on file   Highest education level: Not on file  Occupational History   Occupation: [employer]  Tobacco Use   Smoking status: Former    Packs/day: 1.00    Years: 20.00    Pack years: 20.00    Types: Cigarettes    Quit date: 09/07/2002    Years since quitting: 19.0   Smokeless tobacco: Never  Vaping Use   Vaping Use: Never used  Substance and Sexual Activity   Alcohol use: Yes    Comment: occ   Drug use: Never   Sexual activity: Yes    Birth control/protection: Post-menopausal  Other Topics Concern   Not on file  Social History Narrative   Not on file   Social Determinants of Health   Financial Resource Strain: Not on file  Food Insecurity: Not on file  Transportation Needs: Not on file  Physical Activity: Not on file  Stress: Not on file  Social Connections: Not on file     Family History:  The patient's  family history includes Alcoholism in her father; COPD in her mother; Diabetes in her father and another family member; Heart disease in an other family member; Pneumonia in her father.   ROS:   Please see the history of present illness.    ROS All other systems reviewed and are negative.   PHYSICAL EXAM:    VS:  BP 138/88 (BP Location: Right Arm, Patient Position: Sitting, Cuff Size: Large)    Pulse 96    Ht _1  (1.575 m)    Wt 197 lb 9.6 oz (89.6 kg)    SpO2 96%    BMI 36.14 kg/m   Physical Exam  GEN: Well nourished, well developed, in no acute distress  Neck: no JVD, carotid bruits, or masses Cardiac:RRR; no murmurs, rubs, or gallops  Respiratory:  clear to auscultation bilaterally, normal work of breathing  GI: soft, nontender, nondistended, + BS Ext: without cyanosis, clubbing, or edema, Good distal pulses bilaterally Neuro:  Alert and Oriented x 3, Psych: euthymic mood, full affect  Wt Readings from Last 3 Encounters:  09/09/21 197 lb 9.6 oz (89.6 kg)  08/07/21 189 lb (85.7 kg)  07/16/21 189 lb (85.7 kg)      Studies/Labs Reviewed:   EKG:  EKG is not ordered today.     Recent Labs: 10/02/2020: BUN 17; Creatinine, Ser 0.77; Hemoglobin 15.3; Platelets 277; Potassium 4.4; Sodium 137   Lipid Panel No results found for: CHOL, TRIG, HDL, CHOLHDL, VLDL, LDLCALC, LDLDIRECT  Additional studies/ records that were reviewed today include:  ZIO monitor 10/14/2020 Study Highlights   Persistent atrial fibrillation  (Burden 100%) V rates are frequently elevated with average HR of 102 bpm (range 62-185 bpm)   Echo-  08/28/20  Left ventricular ejection fraction, by estimation, is 60 to 65%. The  left ventricle has normal function. The left ventricle has no regional  wall motion abnormalities. Left ventricular diastolic function could not  be evaluated.   2. Right ventricular systolic function is normal. The right ventricular  size is normal. There is normal pulmonary artery systolic pressure. The  estimated right ventricular systolic pressure is 29.4 mmHg.   3. Left atrial size was mildly dilated.   4. A small pericardial effusion is present. The pericardial effusion is  posterior to the left ventricle. There is no evidence of cardiac  tamponade.   5. Hx of MV repair. Either an  annuloplasty ring is present or mild MAC is  present. Unclear surgical details, but does not have typical appearance of  annuloplasty ring. The mitral valve has been repaired/replaced. Mild  mitral valve regurgitation. No  evidence of mitral stenosis.   6. The aortic valve is tricuspid. There is mild calcification of the  aortic valve. There is mild thickening of the aortic valve. Aortic valve  regurgitation is not visualized. Mild aortic valve sclerosis is present,  with no evidence of aortic valve  stenosis.   7. The inferior vena cava is normal in size with greater than 50%  respiratory variability, suggesting right atrial pressure of 3 mmHg.        Risk Assessment/Calculations:    CHA2DS2-VASc Score =     This indicates a  % annual risk of stroke. The patient's score is based upon:          ASSESSMENT:    1. Paroxysmal atrial fibrillation (HCC)      PLAN:  In order of problems listed above: Atrial fibrillation found at the time of the surgical procedure 08/2020.  Was placed on Eliquis and seen in the A. fib clinic for possible cardioversion.  Patient declined wanting to stay in atrial fibrillation   CHA2DS2-VASc equals 1 for female.  Zio monitor 10/14/2020 persistent atrial fibrillation ventricular rates frequently elevated with an average heart rate of 102 bpm but many fast rates.  Metoprolol recommended but patient declined. I placed the patient on diltiazem 180 mg daily.  She now has an apple watch and HR well controlled.    History of "hole in her heart repaired at age 20" but never had mitral valve repair as echo indicates. MD reviewed the echo report and said that it looked atypical and more like mitral annular calcium (MAC). That is what is on the MV annulus and not prior annuloplasty. There is nothing to suggest prior VSD repair, but we would not see this as it  is long healed up and likely graft.          Shared Decision Making/Informed Consent         Medication Adjustments/Labs and Tests Ordered: Current medicines are reviewed at length with the patient today.  Concerns regarding medicines are outlined above.  Medication changes, Labs and Tests ordered today are listed in the Patient Instructions below. Patient Instructions  Medication Instructions:  Your physician recommends that you continue on your current medications as directed. Please refer to the Current Medication list given to you today.  *If you need a refill on your cardiac medications before your next appointment, please call your pharmacy*   Lab Work: NONE If you have labs (blood work) drawn today and your tests are completely normal, you will receive your results only by: [place] (if you have MyChart) OR A paper copy in the mail If you have any lab test that is abnormal or we need to change your treatment, we will call you to review the results.   Testing/Procedures: NONE   Follow-Up: At [facility], you and your health needs are our priority.  As part of our continuing mission to provide you with exceptional heart care, we have created designated Provider Care Teams.  These Care Teams include your primary Cardiologist (physician) and Advanced Practice Providers (APPs -  Physician Assistants and Nurse Practitioners) who all work together to provide you with the care you need, when you need it.  We recommend signing up for the patient portal called "MyChart".  Sign up information is provided on this After Visit Summary.  MyChart is used to connect with patients for Virtual Visits (Telemedicine).  Patients are able to view lab/test results, encounter notes, upcoming appointments, etc.  Non-urgent messages can be sent to your provider as well.   To learn more about what you can do with MyChart, go to [email or web address].    Your next appointment:   6 month(s)  The format for your next appointment:   In Person  Provider:   DR. Johney Frame OR [facility]  Levy Wellman}    Other Instructions YOUR PROVIDER RECOMMENDS THAT YOU GET 150 MINUTES OF EXERCISE A WEEK   Signed, Ermalinda Barrios, PA-C  09/09/2021 10:15 AM    [address], [place], [place]  [address] Phone: [phone]; Fax: [phone]

## 2021-09-09 ENCOUNTER — Encounter: Payer: Self-pay | Admitting: Physician Assistant

## 2021-09-09 ENCOUNTER — Ambulatory Visit (INDEPENDENT_AMBULATORY_CARE_PROVIDER_SITE_OTHER): Payer: PRIVATE HEALTH INSURANCE | Admitting: Physician Assistant

## 2021-09-09 ENCOUNTER — Other Ambulatory Visit: Payer: Self-pay

## 2021-09-09 VITALS — BP 138/88 | HR 96 | Ht 62.0 in | Wt 197.6 lb

## 2021-09-09 DIAGNOSIS — I480 Paroxysmal atrial fibrillation: Secondary | ICD-10-CM

## 2021-09-09 MED ORDER — DILTIAZEM HCL ER COATED BEADS 180 MG PO CP24: 180.0000 mg | ORAL_CAPSULE | Freq: Every day | ORAL | 3 refills | Status: AC

## 2021-09-09 NOTE — Patient Instructions (Signed)
Medication Instructions:  Your physician recommends that you continue on your current medications as directed. Please refer to the Current Medication list given to you today.  *If you need a refill on your cardiac medications before your next appointment, please call your pharmacy*   Lab Work: NONE If you have labs (blood work) drawn today and your tests are completely normal, you will receive your results only by: [place] (if you have MyChart) OR A paper copy in the mail If you have any lab test that is abnormal or we need to change your treatment, we will call you to review the results.   Testing/Procedures: NONE   Follow-Up: At [facility], you and your health needs are our priority.  As part of our continuing mission to provide you with exceptional heart care, we have created designated Provider Care Teams.  These Care Teams include your primary Cardiologist (physician) and Advanced Practice Providers (APPs -  Physician Assistants and Nurse Practitioners) who all work together to provide you with the care you need, when you need it.  We recommend signing up for the patient portal called "MyChart".  Sign up information is provided on this After Visit Summary.  MyChart is used to connect with patients for Virtual Visits (Telemedicine).  Patients are able to view lab/test results, encounter notes, upcoming appointments, etc.  Non-urgent messages can be sent to your provider as well.   To learn more about what you can do with MyChart, go to [email or web address].    Your next appointment:   6 month(s)  The format for your next appointment:   In Person  Provider:   DR. Johney Frame OR MICHELE LENZE}    Other Instructions YOUR PROVIDER RECOMMENDS THAT YOU GET 150 MINUTES OF EXERCISE A WEEK

## 2022-03-22 NOTE — Progress Notes (Unsigned)
Cardiology Office Note:    Date:  03/22/2022   ID:  [name], DOB 1961/01/08, MRN [id number]  PCP:  [facility], [place] Providers Cardiologist:  None { Click to update primary MD,subspecialty MD or APP then REFRESH:1}    Referring MD: [facility], MD   No chief complaint on file. ***  History of Present Illness:    [name] is a 61 y.o. 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   History of vulvar dysplasia    VIN3   Lumbar spondylosis    OA (osteoarthritis)    PMB (postmenopausal bleeding)    Scoliosis    Wears glasses     Past Surgical History:  Procedure Laterality Date   ANTERIOR CERVICAL DECOMP/DISCECTOMY FUSION  11-25-2009  '@[facility]'$    C4 --- C7   ANTERIOR LAT LUMBAR FUSION Left 10/22/2016   Procedure: Anterior Lateral Lumbar Fusion - Lumbar two - lumbar three, with Lateral plate;  Surgeon: Kary Kos, MD;  Location: [facility];  Service: Neurosurgery;  Laterality: Left;  Anterior Lateral Lumbar Fusion - Lumbar two - lumbar three, with Lateral plate   CARDIAC CATHETERIZATION  age 14   CO2 LASER APPLICATION N/A 40/98/1191   Procedure: CO2 LASER APPLICATION;  Surgeon: Gus Height, MD;  Location: [facility];  Service: Gynecology;  Laterality: N/A;   COLONOSCOPY W/ POLYPECTOMY  06/21/2017   Dr.Perry   HYSTEROSCOPY WITH D & C N/A 08/27/2020   Procedure: DILATATION AND  CURETTAGE /HYSTEROSCOPY;  Surgeon: Vanessa Kick, MD;  Location: [facility];  Service: Gynecology;  Laterality: N/A;   LAPAROSCOPIC CHOLECYSTECTOMY  04/2006   LEG SURGERY Right 1983   femoral artery repair from cardiac cath injury    OPEN REDUCTION INTERNAL FIXATION (ORIF) METACARPAL Right 08/12/2017   Procedure: OPEN REDUCTION INTERNAL FIXATION (ORIF) 5th METACARPAL fracture;  Surgeon: Roseanne Kaufman, MD;  Location: [facility];  Service: Orthopedics;  Laterality: Right;  60 mins   SPINAL FIXATION SURGERY  1973   age 17 - sclosis   TOTAL HIP ARTHROPLASTY Right 06-16-2010 '@[facility]'$    UPPER GASTROINTESTINAL ENDOSCOPY     VSD REPAIR  age 51   VULVECTOMY N/A 09/18/2015   Procedure: WIDE EXCISION VULVECTOMY;  Surgeon: Vanessa Kick, MD;  Location: [facility];  Service: Gynecology;  Laterality: N/A;    Current Medications: No outpatient medications have been marked as taking for the 03/24/22 encounter (Appointment) with Freada Bergeron, MD.     Allergies:   Patient has no known allergies.   Social History   Socioeconomic History   Marital status: Married    Spouse name: Not on file   Number of children: Not on file   Years of education: Not on file   Highest education level: Not on file  Occupational History  Occupation: [employer]  Tobacco Use   Smoking status: Former    Packs/day: 1.00    Years: 20.00    Total pack years: 20.00    Types: Cigarettes    Quit date: 09/07/2002    Years since quitting: 19.5   Smokeless tobacco: Never  Vaping Use   Vaping Use: Never used  Substance and Sexual Activity   Alcohol use: Yes    Comment: occ   Drug use: Never   Sexual activity: Yes    Birth control/protection: Post-menopausal  Other Topics Concern   Not on file  Social History Narrative   Not on file   Social Determinants of Health   Financial Resource Strain: Not on file  Food Insecurity: Not on file  Transportation Needs: Not on file  Physical Activity: Not on file   Stress: Not on file  Social Connections: Not on file     Family History: The patient's ***family history includes Alcoholism in her father; COPD in her mother; Diabetes in her father and another family member; Heart disease in an other family member; Pneumonia in her father. There is no history of Colon cancer.  ROS:   Please see the history of present illness.    *** All other systems reviewed and are negative.  EKGs/Labs/Other Studies Reviewed:    The following studies were reviewed today: ***  EKG:  EKG is *** ordered today.  The ekg ordered today demonstrates ***  Recent Labs: No results found for requested labs within last 365 days.  Recent Lipid Panel No results found for: "CHOL", "TRIG", "HDL", "CHOLHDL", "VLDL", "LDLCALC", "LDLDIRECT"   Risk Assessment/Calculations:   {Does this patient have ATRIAL FIBRILLATION?:[phone]}       Physical Exam:    VS:  There were no vitals taken for this visit.    Wt Readings from Last 3 Encounters:  09/09/21 197 lb 9.6 oz (89.6 kg)  08/07/21 189 lb (85.7 kg)  07/16/21 189 lb (85.7 kg)     GEN: *** Well nourished, well developed in no acute distress HEENT: Normal NECK: No JVD; No carotid bruits LYMPHATICS: No lymphadenopathy CARDIAC: ***RRR, no murmurs, rubs, gallops RESPIRATORY:  Clear to auscultation without rales, wheezing or rhonchi  ABDOMEN: Soft, non-tender, non-distended MUSCULOSKELETAL:  No edema; No deformity  SKIN: Warm and dry NEUROLOGIC:  Alert and oriented x 3 PSYCHIATRIC:  Normal affect   ASSESSMENT:    No diagnosis found. PLAN:    In order of problems listed above:  ***      {Are you ordering a CV Procedure (e.g. stress test, cath, DCCV, TEE, etc)?   Press F2        :[id number]}    Medication Adjustments/Labs and Tests Ordered: Current medicines are reviewed at length with the patient today.  Concerns regarding medicines are outlined above.  No orders of the defined types were placed in this  encounter.  No orders of the defined types were placed in this encounter.   There are no Patient Instructions on file for this visit.   Signed, Freada Bergeron, MD  03/22/2022 8:32 PM    [place]

## 2022-03-24 ENCOUNTER — Encounter: Payer: Self-pay | Admitting: *Deleted

## 2022-03-24 ENCOUNTER — Ambulatory Visit (INDEPENDENT_AMBULATORY_CARE_PROVIDER_SITE_OTHER): Payer: PRIVATE HEALTH INSURANCE | Admitting: Cardiology

## 2022-03-24 ENCOUNTER — Encounter: Payer: Self-pay | Admitting: Cardiology

## 2022-03-24 VITALS — BP 120/60 | HR 86 | Ht 62.0 in | Wt 196.0 lb

## 2022-03-24 DIAGNOSIS — R600 Localized edema: Secondary | ICD-10-CM

## 2022-03-24 DIAGNOSIS — R0602 Shortness of breath: Secondary | ICD-10-CM | POA: Diagnosis not present

## 2022-03-24 DIAGNOSIS — I4891 Unspecified atrial fibrillation: Secondary | ICD-10-CM

## 2022-03-24 DIAGNOSIS — D6869 Other thrombophilia: Secondary | ICD-10-CM

## 2022-03-24 DIAGNOSIS — E785 Hyperlipidemia, unspecified: Secondary | ICD-10-CM

## 2022-03-24 DIAGNOSIS — Z79899 Other long term (current) drug therapy: Secondary | ICD-10-CM

## 2022-03-24 MED ORDER — APIXABAN 5 MG PO TABS: 5.0000 mg | ORAL_TABLET | Freq: Two times a day (BID) | ORAL | 6 refills | Status: DC

## 2022-03-24 MED ORDER — FUROSEMIDE 20 MG PO TABS: ORAL_TABLET | ORAL | 0 refills | Status: DC

## 2022-03-24 MED ORDER — POTASSIUM CHLORIDE CRYS ER 10 MEQ PO TBCR: EXTENDED_RELEASE_TABLET | ORAL | 0 refills | Status: DC

## 2022-03-24 NOTE — Patient Instructions (Addendum)
Medication Instructions:    STOP TAKING MELOXICAM NOW  START TAKING ELIQUIS 5 MG BY MOUTH TWICE DAILY  START TAKING LASIX 20 MG BY MOUTH TWICE DAILY FOR 3 DAYS ONLY, THEN DECREASE TO TAKING 20 MG BY MOUTH DAILY THEREAFTER.  START TAKING POTASSIUM CHLORIDE 10 mEq BY MOUTH TWICE DAILY FOR 3 DAYS ONLY, THEN DECREASE TO TAKING 10 mEq BY MOUTH DAILY THEREAFTER--TAKE IN CONCURRENCE WITH LASIX  *If you need a refill on your cardiac medications before your next appointment, please call your pharmacy*   You have been referred to [facility] 3 WEEKS    Lab Work:  1.) TODAY--CMET AND PRO-BNP  2.)  SAME DAY AS YOUR LEXISCAN IS [place]  If you have labs (blood work) drawn today and your tests are completely normal, you will receive your results only by: MyChart Message (if you have MyChart) OR A paper copy in the mail If you have any lab test that is abnormal or we need to change your treatment, we will call you to review the results.   Testing/Procedures:  Your physician has requested that you have an echocardiogram. Echocardiography is a painless test that uses sound waves to create images of your heart. It provides your doctor with information about the size and shape of your heart and how well your heart's chambers and valves are working. This procedure takes approximately one hour. There are no restrictions for this procedure.  Your physician has requested that you have a lexiscan myoview. For further information please visit [email or web address]. Please follow instruction sheet, as given.    Follow-Up:  1.)  IN 3 WEEKS WITH AFIB CLINIC  2.)  IN 3 MONTHS WITH AN EXTENDER IN THE OFFICE

## 2022-03-24 NOTE — Progress Notes (Signed)
Cardiology Office Note:    Date:  03/24/2022   ID:  [name], DOB 1961/08/23, MRN [id number]  PCP:  [facility], [place] Providers Cardiologist:  None {  Referring MD: [facility], MD    History of Present Illness:    [name] is a 61 y.o. female with a hx of paroxysmal Afib, prior VSD repair at age 69, MVP who was previously followed by Dr. Meda Coffee who now presents to clinic for follow-up.  Patient was initially seen by Cardiology in 08/2020 after she developed Afib in the setting of D and C. TTE with LVEF 60-65%, normal RV, mild LAE, possible mitral valve repair. She was recommended for DCCV but she declined. Was placed on dilt as she declined metop.  Today, she is accompanied with her husband, and says she has been feeling more symptoms of her A. Fib. Reports feeling "shaky," fatigued, and worsening shortness of breath with exertion. When walking or going up the stairs, she experiences palpitations and shortness of breath which is not normal for her. Can also have some associated lightheadedness when this occurs especially if HR >140. Her husband also states that she has experienced edema for the past several months, which is new.  The patient denies chest pain, nocturnal dyspnea, orthopnea. No known personal or family history of CAD  Past Medical History:  Diagnosis Date   Arthritis    Cancer ([place])    Skin on left hand   DDD (degenerative disc disease), lumbar    DDD (degenerative disc disease), lumbosacral    GERD (gastroesophageal reflux disease)    Uses OTC medication as needed   History of avascular necrosis of capital femoral epiphysis    s/p THA right   History of mitral valve prolapse    per pt previous has seen cardiologist , dr gamble , yrs ago--- echo scanned in epic dated 04-22-2007 (scanned on dated 06-20-2010)  shows no evidence mvp, ef >55%, trace MR/TR and mild AV sclerosis but no stenosis or regurg.   History of vulvar  dysplasia    VIN3   Lumbar spondylosis    OA (osteoarthritis)    PMB (postmenopausal bleeding)    Scoliosis    Wears glasses     Past Surgical History:  Procedure Laterality Date   ANTERIOR CERVICAL DECOMP/DISCECTOMY FUSION  11-25-2009  _0    C4 --- C7   ANTERIOR LAT LUMBAR FUSION Left 10/22/2016   Procedure: Anterior Lateral Lumbar Fusion - Lumbar two - lumbar three, with Lateral plate;  Surgeon: Kary Kos, MD;  Location: [facility];  Service: Neurosurgery;  Laterality: Left;  Anterior Lateral Lumbar Fusion - Lumbar two - lumbar three, with Lateral plate   CARDIAC CATHETERIZATION  age 69   CO2 LASER APPLICATION N/A 25/36/6440   Procedure: CO2 LASER APPLICATION;  Surgeon: Gus Height, MD;  Location: [facility];  Service: Gynecology;  Laterality: N/A;   COLONOSCOPY W/ POLYPECTOMY  06/21/2017   Dr.Perry   HYSTEROSCOPY WITH D & C N/A 08/27/2020   Procedure: DILATATION AND CURETTAGE /HYSTEROSCOPY;  Surgeon: Vanessa Kick, MD;  Location: [facility];  Service: Gynecology;  Laterality: N/A;   LAPAROSCOPIC CHOLECYSTECTOMY  04/2006   LEG SURGERY Right 1983   femoral artery repair from cardiac cath injury    OPEN REDUCTION INTERNAL FIXATION (ORIF) METACARPAL Right 08/12/2017   Procedure: OPEN REDUCTION INTERNAL FIXATION (ORIF) 5th METACARPAL fracture;  Surgeon: Roseanne Kaufman, MD;  Location: [facility];  Service: Orthopedics;  Laterality:  Right;  60 mins   SPINAL FIXATION SURGERY  1973   age 59 - sclosis   TOTAL HIP ARTHROPLASTY Right 06-16-2010 _0    UPPER GASTROINTESTINAL ENDOSCOPY     VSD REPAIR  age 64   VULVECTOMY N/A 09/18/2015   Procedure: WIDE EXCISION VULVECTOMY;  Surgeon: Vanessa Kick, MD;  Location: [facility];  Service: Gynecology;  Laterality: N/A;    Current Medications: Current Meds  Medication Sig   apixaban (ELIQUIS) 5 MG TABS tablet Take 1 tablet (5 mg total) by mouth 2 (two) times daily.   Collagen-Vitamin C-Biotin (COLLAGEN 1500/C PO) Take 3 capsules by mouth daily.    diltiazem (CARDIZEM CD) 180 MG 24 hr capsule Take 1 capsule (180 mg total) by mouth daily.   estrogens-methylTEST 1.25-2.5 MG TABS tablet Take 1 tablet by mouth daily.   fluticasone (FLONASE) 50 MCG/ACT nasal spray fluticasone propionate 50 mcg/actuation nasal spray,suspension  SHAKE LIQUID AND USE 1 SPRAY IN EACH   furosemide (LASIX) 20 MG tablet Take 1 tablet (20 mg total) by mouth twice daily for 3 days only, then decrease to taking 1 tablet (20 mg total) by mouth daily thereafter.   medroxyPROGESTERone (PROVERA) 5 MG tablet Take 5 mg by mouth daily.   OVER THE COUNTER MEDICATION Take 2 capsules by mouth daily. Zinc, vit C and Vit D   potassium chloride (KLOR-CON M) 10 MEQ tablet Take 1 tablet (10 mEq total) by mouth twice daily for 3 days only, then decrease to taking 1 tablet (10 mEq total) by mouth daily thereafter.   traMADol (ULTRAM) 50 MG tablet Take 100 mg by mouth every morning.   [DISCONTINUED] meloxicam (MOBIC) 15 MG tablet Take 15 mg by mouth daily.     Allergies:   Patient has no known allergies.   Social History   Socioeconomic History   Marital status: Married    Spouse name: Not on file   Number of children: Not on file   Years of education: Not on file   Highest education level: Not on file  Occupational History   Occupation: [employer]  Tobacco Use   Smoking status: Former    Packs/day: 1.00    Years: 20.00    Total pack years: 20.00    Types: Cigarettes    Quit date: 09/07/2002    Years since quitting: 19.5   Smokeless tobacco: Never  Vaping Use   Vaping Use: Never used  Substance and Sexual Activity   Alcohol use: Yes    Comment: occ   Drug use: Never   Sexual activity: Yes    Birth control/protection: Post-menopausal  Other Topics Concern   Not on file  Social History Narrative   Not on file   Social Determinants of Health   Financial Resource Strain: Not on file  Food Insecurity: Not on file  Transportation Needs: Not on file  Physical Activity:  Not on file  Stress: Not on file  Social Connections: Not on file     Family History: The patient's family history includes Alcoholism in her father; COPD in her mother; Diabetes in her father and another family member; Heart disease in an other family member; Pneumonia in her father. There is no history of Colon cancer.  ROS:   Please see the history of present illness.    (+) SOB with exertion (+) Fatigue (+) Edema (+) palpitations All other systems reviewed and are negative.  EKGs/Labs/Other Studies Reviewed:    The following studies were reviewed today: ZIO monitor 10/14/2020  Study Highlights   Persistent atrial fibrillation  (Burden 100%) V rates are frequently elevated with average HR of 102 bpm (range 62-185 bpm)  TTE 08/2020: IMPRESSIONS     1. Left ventricular ejection fraction, by estimation, is 60 to 65%. The  left ventricle has normal function. The left ventricle has no regional  wall motion abnormalities. Left ventricular diastolic function could not  be evaluated.   2. Right ventricular systolic function is normal. The right ventricular  size is normal. There is normal pulmonary artery systolic pressure. The  estimated right ventricular systolic pressure is 88.8 mmHg.   3. Left atrial size was mildly dilated.   4. A small pericardial effusion is present. The pericardial effusion is  posterior to the left ventricle. There is no evidence of cardiac  tamponade.   5. Hx of MV repair. Either an annuloplasty ring is present or mild MAC is  present. Unclear surgical details, but does not have typical appearance of  annuloplasty ring. The mitral valve has been repaired/replaced. Mild  mitral valve regurgitation. No  evidence of mitral stenosis.   6. The aortic valve is tricuspid. There is mild calcification of the  aortic valve. There is mild thickening of the aortic valve. Aortic valve  regurgitation is not visualized. Mild aortic valve sclerosis is present,  with  no evidence of aortic valve  stenosis.   7. The inferior vena cava is normal in size with greater than 50%  respiratory variability, suggesting right atrial pressure of 3 mmHg.   EKG:   03/24/2022 EKG: Afib with HR86bpm  Recent Labs: No results found for requested labs within last 365 days.  Recent Lipid Panel No results found for: "CHOL", "TRIG", "HDL", "CHOLHDL", "VLDL", "LDLCALC", "LDLDIRECT"   Risk Assessment/Calculations:    CHA2DS2-VASc Score = 1  This indicates a 0.6% annual risk of stroke. The patient's score is based upon: CHF History: 0 HTN History: 0 Diabetes History: 0 Stroke History: 0 Vascular Disease History: 0 Age Score: 0 Gender Score: 1           Physical Exam:    VS:  BP 120/60 (BP Location: Left Arm, Patient Position: Sitting, Cuff Size: Normal)   Pulse 86   Ht _0  (1.575 m)   Wt 196 lb (88.9 kg)   BMI 35.85 kg/m     Wt Readings from Last 3 Encounters:  03/24/22 196 lb (88.9 kg)  09/09/21 197 lb 9.6 oz (89.6 kg)  08/07/21 189 lb (85.7 kg)     GEN:  Well nourished, well developed in no acute distress HEENT: Normal NECK: No JVD; No carotid bruits CARDIAC: Irregular, no murmurs RESPIRATORY:  Clear to auscultation without rales, wheezing or rhonchi  ABDOMEN: Soft, non-tender, non-distended MUSCULOSKELETAL:  1+ edema to the mid-shin SKIN: Warm and dry NEUROLOGIC:  Alert and oriented x 3 PSYCHIATRIC:  Normal affect   ASSESSMENT:    1. SOB (shortness of breath)   2. Atrial fibrillation, unspecified type ([place])   3. Bilateral leg edema   4. Medication management   5. Hyperlipidemia, unspecified hyperlipidemia type   6. Secondary hypercoagulable state ([place])    PLAN:    In order of problems listed above:  #Dyspnea on Exertion: #LE edema: Patient reports worsening dyspnea on exertion, fatigue, LE edema and palpitations over the past several months. States she thinks she is becoming more symptomatic from her Afib, however, given that she  has tolerated Afib without issue for 2 years, I would not suspect her to  suddenly become symptomatic unless she is having issues with rate control at home. Given symptoms and evidence of volume overload, will check myoview and TTE to evaluate for ischemia and possible heart failure. If above is reassuring, can consider DCCV to see if return to NSR helps symptoms. Will resume apixaban at this time in preparation for possible cardioversion and start diuresis. Patient is currently well rate controlled on diltiazem. Has had difficulty with BB in the past but discussed that if EF is low, this is the next best option. -Check myoview (cannot perform CTA due to Afib) -Check TTE -Start lasix 14m BID for 3 days and then 250mdaily thereafter -Start potassium 1029mBID x3 days and then daily thereafter -Check BNP and BMET today -Start apixaban for AC [facility] preparation for possible DCCV pending above work-up -Will have follow-up in Afib clinic; will need repeat BMET at that time  #Paroxysmal Afib: CHADs-vasc 1. Declined DCCV in 2021 and has been in persistent Afib for 2 years without significant symptoms until recently as detailed above. Currently well rate controlled on dilt. Will perform ischemic work-up and TTE as above and if reassuring, can plan for DCCV after 3 weeks of AC. -Continue dilt 180m30mily -Start apixaban 5mg 67m -Plan to see Afib clinic in 3 weeks to reassess -Plan for ischemic evaluation and TTE as above  #Suspected VSD Repair: Surgical history unclear as occurred at 15. T51 with no residual leak. Suspect MV MAC rather than repair. Repeating TTE as above.       Medication Adjustments/Labs and Tests Ordered: Current medicines are reviewed at length with the patient today.  Concerns regarding medicines are outlined above.  Orders Placed This Encounter  Procedures   Comp Met (CMET)   Pro b natriuretic peptide   Lipid Profile   Amb Referral to AFIB Clinic   MYOCARDIAL PERFUSION IMAGING    EKG 12-Lead   ECHOCARDIOGRAM COMPLETE   Meds ordered this encounter  Medications   furosemide (LASIX) 20 MG tablet    Sig: Take 1 tablet (20 mg total) by mouth twice daily for 3 days only, then decrease to taking 1 tablet (20 mg total) by mouth daily thereafter.    Dispense:  36 tablet    Refill:  0   potassium chloride (KLOR-CON M) 10 MEQ tablet    Sig: Take 1 tablet (10 mEq total) by mouth twice daily for 3 days only, then decrease to taking 1 tablet (10 mEq total) by mouth daily thereafter.    Dispense:  36 tablet    Refill:  0    Take in concurrence with lasix.   apixaban (ELIQUIS) 5 MG TABS tablet    Sig: Take 1 tablet (5 mg total) by mouth 2 (two) times daily.    Dispense:  60 tablet    Refill:  6    Patient Instructions  Medication Instructions:    STOP TAKING MELOXICAM NOW  START TAKING ELIQUIS 5 MG BY MOUTH TWICE DAILY  START TAKING LASIX 20 MG BY MOUTH TWICE DAILY FOR 3 DAYS ONLY, THEN DECREASE TO TAKING 20 MG BY MOUTH DAILY THEREAFTER.  START TAKING POTASSIUM CHLORIDE 10 mEq BY MOUTH TWICE DAILY FOR 3 DAYS ONLY, THEN DECREASE TO TAKING 10 mEq BY MOUTH DAILY THEREAFTER--TAKE IN CONCURRENCE WITH LASIX  *If you need a refill on your cardiac medications before your next appointment, please call your pharmacy*   You have been referred to [facility]    Lab  Work:  1.) TODAY--CMET AND PRO-BNP  2.)  SAME DAY AS YOUR LEXISCAN IS SCHEDULED--CHECK LIPIDS  If you have labs (blood work) drawn today and your tests are completely normal, you will receive your results only by: [place] (if you have MyChart) OR A paper copy in the mail If you have any lab test that is abnormal or we need to change your treatment, we will call you to review the results.   Testing/Procedures:  Your physician has requested that you have an echocardiogram. Echocardiography is a painless test that uses sound waves to create images of your heart. It provides  your doctor with information about the size and shape of your heart and how well your heart's chambers and valves are working. This procedure takes approximately one hour. There are no restrictions for this procedure.  Your physician has requested that you have a lexiscan myoview. For further information please visit [email or web address]. Please follow instruction sheet, as given.    Follow-Up:  1.)  IN 3 WEEKS WITH AFIB CLINIC  2.)  IN 3 MONTHS WITH AN EXTENDER IN THE OFFICE              Follow up in 3 months or PRN   I,Tinashe Williams,acting as a scribe for Freada Bergeron, MD.,have documented all relevant documentation on the behalf of Freada Bergeron, MD,as directed by  Freada Bergeron, MD while in the presence of Freada Bergeron, MD.   I, Freada Bergeron, MD, have reviewed all documentation for this visit. The documentation on 03/24/22 for the exam, diagnosis, procedures, and orders are all accurate and complete.

## 2022-03-25 LAB — COMPREHENSIVE METABOLIC PANEL
ALT: 10 IU/L (ref 0–32)
AST: 16 IU/L (ref 0–40)
Albumin/Globulin Ratio: 2 (ref 1.2–2.2)
Albumin: 4 g/dL (ref 3.9–4.9)
Alkaline Phosphatase: 55 IU/L (ref 44–121)
BUN/Creatinine Ratio: 25 (ref 12–28)
BUN: 19 mg/dL (ref 8–27)
Bilirubin Total: 0.2 mg/dL (ref 0.0–1.2)
CO2: 23 mmol/L (ref 20–29)
Calcium: 9.1 mg/dL (ref 8.7–10.3)
Chloride: 105 mmol/L (ref 96–106)
Creatinine, Ser: 0.75 mg/dL (ref 0.57–1.00)
Globulin, Total: 2 g/dL (ref 1.5–4.5)
Glucose: 93 mg/dL (ref 70–99)
Potassium: 4.5 mmol/L (ref 3.5–5.2)
Sodium: 139 mmol/L (ref 134–144)
Total Protein: 6 g/dL (ref 6.0–8.5)
eGFR: 91 mL/min/{1.73_m2} (ref >59)

## 2022-03-25 LAB — PRO B NATRIURETIC PEPTIDE: NT-Pro BNP: 1025 pg/mL — ABNORMAL HIGH (ref 0–287)

## 2022-03-28 ENCOUNTER — Encounter: Payer: Self-pay | Admitting: Cardiology

## 2022-03-29 ENCOUNTER — Encounter: Payer: Self-pay | Admitting: Internal Medicine

## 2022-03-29 DIAGNOSIS — D500 Iron deficiency anemia secondary to blood loss (chronic): Secondary | ICD-10-CM | POA: Insufficient documentation

## 2022-03-30 ENCOUNTER — Telehealth: Payer: Self-pay | Admitting: Internal Medicine

## 2022-03-30 NOTE — Telephone Encounter (Signed)
My Chart message received and sent to Dr Henrene Pastor.

## 2022-04-01 ENCOUNTER — Other Ambulatory Visit: Payer: Self-pay

## 2022-04-01 ENCOUNTER — Ambulatory Visit (INDEPENDENT_AMBULATORY_CARE_PROVIDER_SITE_OTHER): Payer: PRIVATE HEALTH INSURANCE | Admitting: Internal Medicine

## 2022-04-01 ENCOUNTER — Encounter: Payer: Self-pay | Admitting: Internal Medicine

## 2022-04-01 ENCOUNTER — Other Ambulatory Visit (INDEPENDENT_AMBULATORY_CARE_PROVIDER_SITE_OTHER): Payer: PRIVATE HEALTH INSURANCE

## 2022-04-01 VITALS — BP 112/64 | HR 92 | Ht 62.0 in | Wt 192.0 lb

## 2022-04-01 DIAGNOSIS — Z791 Long term (current) use of non-steroidal anti-inflammatories (NSAID): Secondary | ICD-10-CM

## 2022-04-01 DIAGNOSIS — D649 Anemia, unspecified: Secondary | ICD-10-CM

## 2022-04-01 DIAGNOSIS — D509 Iron deficiency anemia, unspecified: Secondary | ICD-10-CM

## 2022-04-01 DIAGNOSIS — Z86010 Personal history of colonic polyps: Secondary | ICD-10-CM

## 2022-04-01 DIAGNOSIS — K921 Melena: Secondary | ICD-10-CM | POA: Diagnosis not present

## 2022-04-01 LAB — CBC WITH DIFFERENTIAL/PLATELET
Basophils Absolute: 0.1 10*3/uL (ref 0.0–0.1)
Basophils Relative: 0.9 % (ref 0.0–3.0)
Eosinophils Absolute: 0.1 10*3/uL (ref 0.0–0.7)
Eosinophils Relative: 1.2 % (ref 0.0–5.0)
HCT: 24.2 % — ABNORMAL LOW (ref 36.0–46.0)
Hemoglobin: 7.7 g/dL — CL (ref 12.0–15.0)
Lymphocytes Relative: 21 % (ref 12.0–46.0)
Lymphs Abs: 2.3 10*3/uL (ref 0.7–4.0)
MCHC: 31.8 g/dL (ref 30.0–36.0)
MCV: 79.3 fl (ref 78.0–100.0)
Monocytes Absolute: 0.9 10*3/uL (ref 0.1–1.0)
Monocytes Relative: 8.3 % (ref 3.0–12.0)
Neutro Abs: 7.6 10*3/uL (ref 1.4–7.7)
Neutrophils Relative %: 68.6 % (ref 43.0–77.0)
Platelets: 443 10*3/uL — ABNORMAL HIGH (ref 150.0–400.0)
RBC: 3.05 Mil/uL — ABNORMAL LOW (ref 3.87–5.11)
RDW: 18.6 % — ABNORMAL HIGH (ref 11.5–15.5)
WBC: 11.1 10*3/uL — ABNORMAL HIGH (ref 4.0–10.5)

## 2022-04-01 NOTE — Progress Notes (Signed)
HISTORY OF PRESENT ILLNESS:  [name] is a 61 y.o. female with a history of GERD and adenomatous colon polyps who presents today after recent hospitalization with symptomatic iron deficiency anemia and intermittent melena.  She is accompanied today by her husband.  Patient was last seen in this office September 2013.  The patient has noticed progressive fatigue over the last 6 months.  As well intermittent dark stools.  On March 28, 2022 she presented to an outside hospital [facility] in [place]) with syncope.  Outside evaluations, x-rays, and laboratories reviewed.  She was found to have significant iron deficiency anemia with a hemoglobin of 5.1.  She was transfused 2 units of blood.  Postinfusion hemoglobin 7.1.  She stabilized and requested to be discharged.  She saw her PCP yesterday.  He contacted me by phone to review her case and requested urgent GI evaluation.  She is worked on continued schedule.  Patient did see her cardiologist recently with complaints of fatigue and ankle edema.  She is known to have chronic atrial fibrillation for several years duration.  This has been rate controlled and asymptomatic.  She was placed on Eliquis with plans for cardioversion, with concerns that her edema and shortness of breath were related to her atrial arrhythmia.  Patient does take meloxicam daily for years for arthritis and back pain.  At the time of her recent hospitalization, Xarelto was held.  Has not been resumed.  She was placed on iron sulfate 3 times daily as well as pantoprazole 40 mg once daily her PCP added Carafate yesterday.  She still feels fatigued but denies melena or hematochezia at this time.  She did undergo colonoscopy December 2022 history of colon polyps.  This revealed diverticulosis but no neoplasia.  She denies abdominal pain.  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   History of vulvar dysplasia    VIN3   Lumbar spondylosis    OA (osteoarthritis)    PMB (postmenopausal bleeding)    Scoliosis    Wears glasses     Past Surgical History:  Procedure Laterality Date   ANTERIOR CERVICAL DECOMP/DISCECTOMY FUSION  11-25-2009  @[facility]    C4 --- C7   ANTERIOR LAT LUMBAR FUSION Left 10/22/2016   Procedure: Anterior Lateral Lumbar Fusion - Lumbar two - lumbar three, with Lateral plate;  Surgeon: Kary Kos, MD;  Location: [facility];  Service: Neurosurgery;  Laterality: Left;  Anterior Lateral Lumbar Fusion - Lumbar two - lumbar three, with Lateral plate   CARDIAC CATHETERIZATION  age 42   CO2 LASER APPLICATION N/A 00/92/3300   Procedure: CO2 LASER APPLICATION;  Surgeon: Gus Height, MD;  Location: [facility];  Service: Gynecology;  Laterality: N/A;   COLONOSCOPY W/ POLYPECTOMY  06/21/2017   Dr.Favor Kreh   HYSTEROSCOPY WITH D & C N/A 08/27/2020   Procedure: DILATATION AND CURETTAGE /HYSTEROSCOPY;  Surgeon: Vanessa Kick, MD;  Location: [facility];  Service: Gynecology;  Laterality: N/A;   LAPAROSCOPIC CHOLECYSTECTOMY  04/2006   LEG SURGERY Right 1983   femoral artery repair from cardiac cath injury    OPEN REDUCTION INTERNAL FIXATION (ORIF) METACARPAL Right 08/12/2017   Procedure: OPEN REDUCTION INTERNAL  FIXATION (ORIF) 5th METACARPAL fracture;  Surgeon: Roseanne Kaufman, MD;  Location: [facility];  Service: Orthopedics;  Laterality: Right;  60 mins   SPINAL FIXATION SURGERY  1973   age 60 - sclosis   TOTAL HIP ARTHROPLASTY Right 06-16-2010 @[facility]    UPPER GASTROINTESTINAL ENDOSCOPY     VSD REPAIR  age 46   VULVECTOMY N/A 09/18/2015   Procedure: WIDE EXCISION VULVECTOMY;  Surgeon: Vanessa Kick, MD;  Location: [facility];  Service: Gynecology;  Laterality: N/A;    Social History [name]  reports that she quit smoking about 19 years ago. Her smoking use included cigarettes. She has a 20.00 pack-year smoking history. She has never used smokeless tobacco. She reports current alcohol use. She reports that she does not use drugs.  family history includes Alcoholism in her father; COPD in her mother; Diabetes in her father and another family member; Heart disease in an other family member; Pneumonia in her father.  No Known Allergies     PHYSICAL EXAMINATION: Vital signs: BP 112/64   Pulse 92   Ht 5' 2"  (1.575 m)   Wt 192 lb (87.1 kg)   SpO2 99%   BMI 35.12 kg/m   Constitutional: Obese, fatigued-appearing, no acute distress Psychiatric: alert and oriented x3, cooperative Eyes: extraocular movements intact, anicteric, conjunctiva are pale Mouth: oral pharynx moist, no lesions Neck: supple no lymphadenopathy Cardiovascular: Irregularly irregular with controlled rate, no murmur Lungs: clear to auscultation bilaterally Abdomen: soft, obese, nontender, nondistended, no obvious ascites, no peritoneal signs, normal bowel sounds, no organomegaly Rectal: Omitted Extremities: no clubbing or cyanosis.  1+ lower extremity edema bilaterally Skin: no lesions on visible extremities Neuro: No focal deficits.  Cranial nerves intact  ASSESSMENT:  1.  Iron deficiency anemia.  Likely secondary to chronic GI blood loss from NSAIDs.  Now on iron replacement. 2.  Intermittent problems with melena.  Likely  secondary to NSAID induced ulcers. 3.  Chronic atrial fibrillation recently placed on Eliquis.  Now on hold 4.  History of GERD with peptic stricture.  Has not been on PPI.  Now on PPI. 5.  Chronic NSAID use.  Now on hold.  Using tramadol 6.  History of multiple adenomatous colon polyps last colonoscopy December 2022   PLAN:  1.  Continue pantoprazole 40 mg daily 2.  Stop Carafate 3.  Continue iron replacement 3 times daily 4.  CBC today 5.  Continue to hold Eliquis 6.  Continue to avoid NSAIDs 7.  EGD.  Patient is high risk given her comorbidities.The nature of the procedure, as well as the risks, benefits, and alternatives were carefully and thoroughly reviewed with the patient. Ample time for discussion and questions allowed. The patient understood, was satisfied, and agreed to proceed.  We will set this up for more afternoon to expedite the work-up 8.  Surveillance colonoscopy around December 2027 9.  Additional recommendations after the above completed A total time of 60 minutes was spent preparing to see the patient, reviewing  the myriad of outside records, x-rays, laboratories.  Speaking with the patient's PCP.  Obtaining comprehensive history, performing medically appropriate physical examination, counseling the patient and her husband regarding the above listed issues, ordering blood work, medication, and endoscopic procedure.  Finally, documenting clinical information in the health record.

## 2022-04-01 NOTE — Patient Instructions (Signed)
If you are age 61 or older, your body mass index should be between 23-30. Your Body mass index is 35.12 kg/m. If this is out of the aforementioned range listed, please consider follow up with your Primary Care Provider.  If you are age 65 or younger, your body mass index should be between 19-25. Your Body mass index is 35.12 kg/m. If this is out of the aformentioned range listed, please consider follow up with your Primary Care Provider.   ________________________________________________________  The [facility] providers would like to encourage you to use [facility] to communicate with providers for non-urgent requests or questions.  Due to long hold times on the telephone, sending your provider a message by [facility] may be a faster and more efficient way to get a response.  Please allow 48 business hours for a response.  Please remember that this is for non-urgent requests.  _______________________________________________________  [name] have been scheduled for an endoscopy. Please follow written instructions given to you at your visit today. If you use inhalers (even only as needed), please bring them with you on the day of your procedure.

## 2022-04-02 ENCOUNTER — Encounter: Payer: Self-pay | Admitting: Internal Medicine

## 2022-04-02 ENCOUNTER — Telehealth (HOSPITAL_COMMUNITY): Payer: Self-pay | Admitting: Cardiology

## 2022-04-02 ENCOUNTER — Ambulatory Visit (AMBULATORY_SURGERY_CENTER): Payer: PRIVATE HEALTH INSURANCE | Admitting: Internal Medicine

## 2022-04-02 VITALS — BP 111/60 | HR 90 | Temp 99.3°F | Resp 17 | Ht 62.0 in | Wt 192.0 lb

## 2022-04-02 DIAGNOSIS — K449 Diaphragmatic hernia without obstruction or gangrene: Secondary | ICD-10-CM

## 2022-04-02 DIAGNOSIS — K921 Melena: Secondary | ICD-10-CM

## 2022-04-02 DIAGNOSIS — D509 Iron deficiency anemia, unspecified: Secondary | ICD-10-CM

## 2022-04-02 DIAGNOSIS — K317 Polyp of stomach and duodenum: Secondary | ICD-10-CM

## 2022-04-02 MED ORDER — SODIUM CHLORIDE 0.9 % IV SOLN: 500.0000 mL | Freq: Once | INTRAVENOUS | Status: DC

## 2022-04-02 NOTE — Progress Notes (Signed)
Called to room to assist during endoscopic procedure.  Patient ID and intended procedure confirmed with present staff. Received instructions for my participation in the procedure from the performing physician.  

## 2022-04-02 NOTE — Patient Instructions (Signed)
YOU HAD AN ENDOSCOPIC PROCEDURE TODAY AT [facility]:   Refer to the procedure report that was given to you for any specific questions about what was found during the examination.  If the procedure report does not answer your questions, please call your gastroenterologist to clarify.  If you requested that your care partner not be given the details of your procedure findings, then the procedure report has been included in a sealed envelope for you to review at your convenience later.  YOU SHOULD EXPECT: Some feelings of bloating in the abdomen. Passage of more gas than usual.  Walking can help get rid of the air that was put into your GI tract during the procedure and reduce the bloating. If you had a lower endoscopy (such as a colonoscopy or flexible sigmoidoscopy) you may notice spotting of blood in your stool or on the toilet paper. If you underwent a bowel prep for your procedure, you may not have a normal bowel movement for a few days.  Please Note:  You might notice some irritation and congestion in your nose or some drainage.  This is from the oxygen used during your procedure.  There is no need for concern and it should clear up in a day or so.  SYMPTOMS TO REPORT IMMEDIATELY:   Following upper endoscopy (EGD)  Vomiting of blood or coffee ground material  New chest pain or pain under the shoulder blades  Painful or persistently difficult swallowing  New shortness of breath  Fever of 100F or higher  Black, tarry-looking stools  For urgent or emergent issues, a gastroenterologist can be reached at any hour by calling [phone]. Do not use MyChart messaging for urgent concerns.    DIET:  We do recommend a small meal at first, but then you may proceed to your regular diet.  Drink plenty of fluids but you should avoid alcoholic beverages for 24 hours.  MEDICATIONS: Continue present medications. Stay on your iron.  FOLLOW UP: Capsule endoscopy to be scheduled related  to "Iron Deficiency Anemia, rule out small bowel lesion".  Dr. Blanch Media office nurse will call you to schedule this appointment. Please have your primary care provider recheck your blood counts and iron levels in 2 to 3 weeks.  Please see handouts given to you by your recovery nurse.  Thank you for allowing [place] to provide for your healthcare needs today.  ACTIVITY:  You should plan to take it easy for the rest of today and you should NOT DRIVE or use heavy machinery until tomorrow (because of the sedation medicines used during the test).    FOLLOW UP: Our staff will call the number listed on your records the next business day following your procedure.  We will call around 7:15- 8:00 am to check on you and address any questions or concerns that you may have regarding the information given to you following your procedure. If we do not reach you, we will leave a message.  If you develop any symptoms (ie: fever, flu-like symptoms, shortness of breath, cough etc.) before then, please call [phone].  If you test positive for Covid 19 in the 2 weeks post procedure, please call and report this information to [place].    If any biopsies were taken you will be contacted by phone or by letter within the next 1-3 weeks.  Please call us at [phone] if you have not heard about the biopsies in 3 weeks.    SIGNATURES/CONFIDENTIALITY: You and/or your  care partner have signed paperwork which will be entered into your electronic medical record.  These signatures attest to the fact that that the information above on your After Visit Summary has been reviewed and is understood.  Full responsibility of the confidentiality of this discharge information lies with you and/or your care-partner.

## 2022-04-02 NOTE — Progress Notes (Signed)
Vitals-AS  Pt's states no medical or surgical changes since previsit or office visit.  

## 2022-04-02 NOTE — Op Note (Signed)
[facility] Patient Name: [name] Procedure Date: 04/02/2022 3:52 PM MRN: [id number] Endoscopist: Docia Chuck. Henrene Pastor , MD Age: 61 Referring MD:  Date of Birth: November 07, 1960 Gender: Female Account #: [id number] Procedure:                Upper GI endoscopy with biopsies Indications:              Iron deficiency anemia. Intermittent dark stools.                            Chronic NSAID use. Colonoscopy December 2022. Medicines:                Monitored Anesthesia Care Procedure:                Pre-Anesthesia Assessment:                           - Prior to the procedure, a History and Physical                            was performed, and patient medications and                            allergies were reviewed. The patient's tolerance of                            previous anesthesia was also reviewed. The risks                            and benefits of the procedure and the sedation                            options and risks were discussed with the patient.                            All questions were answered, and informed consent                            was obtained. Prior Anticoagulants: The patient has                            taken no previous anticoagulant or antiplatelet                            agents. ASA Grade Assessment: III - A patient with                            severe systemic disease. After reviewing the risks                            and benefits, the patient was deemed in                            satisfactory condition to undergo the procedure.  After obtaining informed consent, the endoscope was                            passed under direct vision. Throughout the                            procedure, the patient's blood pressure, pulse, and                            oxygen saturations were monitored continuously. The                            Endoscope was introduced through the mouth, and                             advanced to the second part of duodenum. The upper                            GI endoscopy was accomplished without difficulty.                            The patient tolerated the procedure well. Scope In: Scope Out: Findings:                 The esophagus revealed a benign distal esophageal                            stricture measuring approximately 15 mm. The                            esophagus was otherwise normal.                           The stomach revealed a moderate-sized sliding                            hiatal hernia without Cameron erosions and                            scattered small benign fundic gland type polyps.                           The examined duodenum was normal. Biopsies for                            histology were taken with a cold forceps for                            evaluation of celiac disease.                           The cardia and gastric fundus were normal on                            retroflexion, save hiatal hernia. Complications:  No immediate complications. Estimated Blood Loss:     Estimated blood loss: none. Impression:               1. Benign esophageal stricture                           2. Moderate hiatal hernia without Cameron erosions                           3. Incidental small benign fundic gland polyps                           4. Otherwise normal EGD. Status post duodenal                            biopsies                           5. No cause for iron deficiency anemia found. Rule                            out more distal small bowel lesion. Recommendation:           1. Patient has a contact number available for                            emergencies. The signs and symptoms of potential                            delayed complications were discussed with the                            patient. Return to normal activities tomorrow.                            Written discharge instructions were provided to the                             patient.                           2. Resume previous diet.                           3. Continue present medications.                           4. SCHEDULE CAPSULE ENDOSCOPY "iron deficiency                            anemia, rule out small bowel lesion".                           5. Please have your primary care provider recheck                            your blood counts and  iron levels in 2 to 3 weeks Docia Chuck. Henrene Pastor, MD 04/02/2022 4:13:24 PM This report has been signed electronically.

## 2022-04-02 NOTE — Telephone Encounter (Signed)
Patient called and cancelled echocardiogram and Myoview for reason below:  04/02/2022 8:39 AM QJ:JHERDEYCX [name], Rachel  Cancel Rsn: Patient (Pt will call back to schedule)  Order will be removed from the [facility] and [facility] and when patient calls back we will resinstate the orders. Thank you

## 2022-04-02 NOTE — Progress Notes (Signed)
VSS, transported to PACU °

## 2022-04-03 ENCOUNTER — Telehealth: Payer: Self-pay | Admitting: *Deleted

## 2022-04-03 NOTE — Telephone Encounter (Signed)
  Follow up Call-     04/02/2022    2:51 PM 08/07/2021    7:25 AM  Call back number  Post procedure Call Back phone  #  [phone]  Permission to leave phone message Yes Yes     Patient questions:  Do you have a fever, pain , or abdominal swelling? No. Pain Score  0 *  Have you tolerated food without any problems? Yes.    Have you been able to return to your normal activities? Yes.    Do you have any questions about your discharge instructions: Diet   No. Medications  No. Follow up visit  No.  Do you have questions or concerns about your Care? No.  Actions: * If pain score is 4 or above: No action needed, pain <4.

## 2022-04-07 ENCOUNTER — Encounter: Payer: Self-pay | Admitting: Internal Medicine

## 2022-04-07 ENCOUNTER — Other Ambulatory Visit: Payer: Self-pay

## 2022-04-07 DIAGNOSIS — D509 Iron deficiency anemia, unspecified: Secondary | ICD-10-CM

## 2022-04-08 ENCOUNTER — Ambulatory Visit (HOSPITAL_COMMUNITY): Payer: PRIVATE HEALTH INSURANCE

## 2022-04-08 ENCOUNTER — Other Ambulatory Visit: Payer: PRIVATE HEALTH INSURANCE

## 2022-04-15 ENCOUNTER — Ambulatory Visit (HOSPITAL_COMMUNITY): Payer: PRIVATE HEALTH INSURANCE | Admitting: Nurse Practitioner

## 2022-04-21 ENCOUNTER — Encounter: Payer: Self-pay | Admitting: Internal Medicine

## 2022-04-21 ENCOUNTER — Telehealth: Payer: Self-pay | Admitting: Internal Medicine

## 2022-04-21 ENCOUNTER — Ambulatory Visit: Payer: PRIVATE HEALTH INSURANCE | Admitting: Internal Medicine

## 2022-04-21 DIAGNOSIS — D509 Iron deficiency anemia, unspecified: Secondary | ICD-10-CM

## 2022-04-21 NOTE — Patient Instructions (Signed)
  The capsule endoscopy procedure will last approximately 8 hours. Contact your doctor's office immediately if you suffer from any abdominal pain, nausea or vomiting during the procedure. 1. You may drink colorless liquids starting 2 hours after swallowing the capsule.  2. You may have a light snack  4 hours after ingestion. After the examination is completed you may return to your normal diet.  3. Check the blue flashing DataRecorder light a couple of times through the day. If is stops blinking or changes color, note the time and contact your doctor.  4. Avoid strong electromagnetic fields such as MRI devices or ham radios after swallowing the capsule and until you pass it in a bowel movement.  5. Do not disconnect the equipment or completely remove the DataRecorder at any time during the procedure.  6. Treat the DataRecorder carefully. Avoid sudden movements and banging the DataRecorder.  7. Avoid direct exposure to bright sunlight.  8. Return to the office at 4:00 pm to have the recording equipment removed.  Clear Liquid Diet Examples: Black Coffee (non-dairy creamer ok) Jell-O (NO fruit added & NO red Jell-o) Water Bouillon (Chicken or Beef) 7-up Cranberry Juice Apple Juice Popsicles (NO red) Tea Coke Sprite Pepsi Ginger Ale Gatorade Mt Dew Dr Pepper Light Snack Examples: Soup Cereal 1/2 Sandwich Salad Eggs Potatoes Toast Rice  

## 2022-04-21 NOTE — Telephone Encounter (Signed)
Inbound call from patient inquiring about daily medication intake. Please give a call back to further advise.  Thank you

## 2022-04-21 NOTE — Progress Notes (Unsigned)
CAPSULE ID: [id number] Exp: 2023-08-13 LOT: [id number]  Patient arrived for capsule endoscopy. Accompanied by female family member. Reported the prep went well. Confirmed patient is fasting. Explained dietary restrictions for the next few hours. Patient verbalized understanding. Opened capsule, ensured capsule was flashing and transmitting to the recorder prior to the patient swallowing the capsule. Patient swallowed capsule without difficulty. Patient told to call the office with any questions. Understands to return to the office today between 4:00 and 4:30 pm. No further questions by the conclusion of the visit.

## 2022-04-21 NOTE — Telephone Encounter (Signed)
Spoke with the patient. She had not taken her morning medications due to her fasting status. The patient is having her capsule endoscopy today. She will take her medications after 10:30 am. Patient has no further questions at this time.

## 2022-04-27 ENCOUNTER — Telehealth: Payer: Self-pay

## 2022-04-27 MED ORDER — PANTOPRAZOLE SODIUM 40 MG PO TBEC: 40.0000 mg | DELAYED_RELEASE_TABLET | Freq: Every day | ORAL | 3 refills | Status: DC

## 2022-04-27 NOTE — Telephone Encounter (Signed)
Pantoprazole refilled per patient's request

## 2022-05-03 NOTE — Progress Notes (Unsigned)
[facility] [address], [place] [address]   CLINIC:  Medical Oncology/Hematology  CONSULT NOTE  Patient Care Team: Redmond School, MD as PCP - General (Internal Medicine)  CHIEF COMPLAINTS/PURPOSE OF CONSULTATION:  Iron deficiency anemia  HISTORY OF PRESENTING ILLNESS:  [name] 61 y.o. female is here at the request of Dr. Gerarda Fraction for evaluation and treatment of anemia.  Patient was recently hospitalized (03/28/2022 - 03/29/2022) for upper GI bleed.  She reported actively melanotic stools as well as symptoms of progressive fatigue, dyspnea, and syncope.  She was admitted to the hospital at [facility] in [place], [place], required 2 transfusions.  Hgb on admission was 5.1, stool guaiac positive.  She was discharged with Hgb 7.1.  Iron studies significant for ferritin 4.9 and serum iron of 6.  She received IV Venofer while hospitalized.  Patient declined endoscopic intervention while hospitalized, preferred to have EGD performed by her established GI (Dr. Henrene Pastor) ***.  For the 2 weeks prior to onset of melanotic stool, patient had been on Eliquis, which was prescribed for anticoagulation in the setting of planned electrocardioversion for her symptomatic atrial fibrillation.  Eliquis was held at hospital discharge.  EGD (04/02/2022) by Dr. Henrene Pastor significant for moderate-sized sliding hiatal hernia, no cause for iron deficiency anemia was found.  Patient to be scheduled for capsule endoscopy to rule out small bowel lesion.  ***  *** Bleeding (incl. periods)-intermittent melena for the past 6 months *** Fatigue *** Pica, RLS, headaches *** CP, DOE, palpitations, LH, syncope  *** Vegetarian diet? *** Iron supplement? *** PPI, blood thinner, NSAID ((meloxicam for osteoarthritis)) *** History of blood transfusion or donation *** EGD or colonoscopy  PMH: *** Atrial fibrillation, hypertension, morbid obesity ***  SOCIAL: *** Former smoker ***  Social drinker *** No drugs *** Works for city of [employer] as ***.  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   History of vulvar dysplasia    VIN3   Lumbar spondylosis    OA (osteoarthritis)    PMB (postmenopausal bleeding)    Scoliosis    Wears glasses     SURGICAL HISTORY: Past Surgical History:  Procedure Laterality Date   ANTERIOR CERVICAL DECOMP/DISCECTOMY FUSION  11-25-2009  @[facility]    C4 --- C7   ANTERIOR LAT LUMBAR FUSION Left 10/22/2016   Procedure: Anterior Lateral Lumbar Fusion - Lumbar two - lumbar three, with Lateral plate;  Surgeon: Kary Kos, MD;  Location: [facility];  Service: Neurosurgery;  Laterality: Left;  Anterior Lateral Lumbar Fusion - Lumbar two - lumbar three, with Lateral plate   CARDIAC CATHETERIZATION  age 5   CO2 LASER APPLICATION N/A 50/93/2671   Procedure: CO2 LASER APPLICATION;  Surgeon: Gus Height, MD;  Location: [facility];  Service: Gynecology;  Laterality: N/A;   COLONOSCOPY  W/ POLYPECTOMY  06/21/2017   Dr.Perry   HYSTEROSCOPY WITH D & C N/A 08/27/2020   Procedure: DILATATION AND CURETTAGE /HYSTEROSCOPY;  Surgeon: Vanessa Kick, MD;  Location: [facility];  Service: Gynecology;  Laterality: N/A;   LAPAROSCOPIC CHOLECYSTECTOMY  04/2006   LEG SURGERY Right 1983   femoral artery repair from cardiac cath injury    OPEN REDUCTION INTERNAL FIXATION (ORIF)  METACARPAL Right 08/12/2017   Procedure: OPEN REDUCTION INTERNAL FIXATION (ORIF) 5th METACARPAL fracture;  Surgeon: Roseanne Kaufman, MD;  Location: [facility];  Service: Orthopedics;  Laterality: Right;  60 mins   SPINAL FIXATION SURGERY  1973   age 75 - sclosis   TOTAL HIP ARTHROPLASTY Right 06-16-2010 @[facility]    UPPER GASTROINTESTINAL ENDOSCOPY     VSD REPAIR  age 45   VULVECTOMY N/A 09/18/2015   Procedure: WIDE EXCISION VULVECTOMY;  Surgeon: Vanessa Kick, MD;  Location: [facility];  Service: Gynecology;  Laterality: N/A;    SOCIAL HISTORY: Social History   Socioeconomic History   Marital status: Married    Spouse name: Not on file   Number of children: Not on file   Years of education: Not on file   Highest education level: Not on file  Occupational History   Occupation: [employer]  Tobacco Use   Smoking status: Former    Packs/day: 1.00    Years: 20.00    Total pack years: 20.00    Types: Cigarettes    Quit date: 09/07/2002    Years since quitting: 19.6   Smokeless tobacco: Never  Vaping Use   Vaping Use: Never used  Substance and Sexual Activity   Alcohol use: Yes    Comment: occ   Drug use: Never   Sexual activity: Yes    Birth control/protection: Post-menopausal  Other Topics Concern   Not on file  Social History Narrative   Not on file   Social Determinants of Health   Financial Resource Strain: Not on file  Food Insecurity: Not on file  Transportation Needs: Not on file  Physical Activity: Not on file  Stress: Not on file  Social Connections: Not on file  Intimate Partner Violence: Not on file    FAMILY HISTORY: Family History  Problem Relation Age of Onset   COPD Mother    Diabetes Father    Pneumonia Father    Alcoholism Father    Diabetes Other    Heart disease Other    Colon cancer Neg Hx    Esophageal cancer Neg Hx    Stomach cancer Neg Hx     ALLERGIES:  has No Known Allergies.  MEDICATIONS:  Current Outpatient Medications  Medication Sig  Dispense Refill   Collagen-Vitamin C-Biotin (COLLAGEN 1500/C PO) Take 3 capsules by mouth daily.     diltiazem (CARDIZEM CD) 180 MG 24 hr capsule Take 1 capsule (180 mg total) by mouth daily. 90 capsule 3   estrogens-methylTEST 1.25-2.5 MG TABS tablet Take 1 tablet by mouth daily.     FEROSUL 325 (65 Fe) MG tablet Take 325 mg by mouth 3 (three) times daily.     fluticasone (FLONASE) 50 MCG/ACT nasal spray fluticasone propionate 50 mcg/actuation nasal spray,suspension  SHAKE LIQUID AND USE 1 SPRAY IN [facility] (Patient not taking: Reported on 04/01/2022)     furosemide (LASIX) 20 MG tablet Take 1 tablet (20 mg total) by mouth twice daily for 3 days only, then decrease to taking 1 tablet (20 mg total) by mouth daily thereafter. (Patient not taking:  Reported on 04/01/2022) 36 tablet 0   medroxyPROGESTERone (PROVERA) 5 MG tablet Take 5 mg by mouth daily.     OVER THE COUNTER MEDICATION Take 2 capsules by mouth daily. Zinc, vit C and Vit D     pantoprazole (PROTONIX) 40 MG tablet Take by mouth.     pantoprazole (PROTONIX) 40 MG tablet Take 1 tablet (40 mg total) by mouth daily. 90 tablet 3   traMADol (ULTRAM) 50 MG tablet Take 100 mg by mouth every morning.     Current Facility-Administered Medications  Medication Dose Route Frequency Provider Last Rate Last Admin   0.9 %  sodium chloride infusion  500 mL Intravenous Once [name], MD        REVIEW OF SYSTEMS:   Review of Systems - Oncology    PHYSICAL EXAMINATION: ECOG PERFORMANCE STATUS: {CHL ONC ECOG NI:[id number]}  There were no vitals filed for this visit. There were no vitals filed for this visit.  Physical Exam    LABORATORY DATA:  I have reviewed the data as listed Recent Results (from the past 2160 hour(s))  Comp Met (CMET)     Status: None   Collection Time: 03/24/22 11:10 AM  Result Value Ref Range   Glucose 93 70 - 99 mg/dL   BUN 19 8 - 27 mg/dL   Creatinine, Ser 0.75 0.57 - 1.00 mg/dL   eGFR 91 >59 mL/min/1.73    BUN/Creatinine Ratio 25 12 - 28   Sodium 139 134 - 144 mmol/L   Potassium 4.5 3.5 - 5.2 mmol/L   Chloride 105 96 - 106 mmol/L   CO2 23 20 - 29 mmol/L   Calcium 9.1 8.7 - 10.3 mg/dL   Total Protein 6.0 6.0 - 8.5 g/dL   Albumin 4.0 3.9 - 4.9 g/dL    Comment:               **Please note reference interval change**   Globulin, Total 2.0 1.5 - 4.5 g/dL   Albumin/Globulin Ratio 2.0 1.2 - 2.2   Bilirubin Total 0.2 0.0 - 1.2 mg/dL   Alkaline Phosphatase 55 44 - 121 IU/L   AST 16 0 - 40 IU/L   ALT 10 0 - 32 IU/L  Pro b natriuretic peptide     Status: Abnormal   Collection Time: 03/24/22 11:10 AM  Result Value Ref Range   NT-Pro BNP 1,025 (H) 0 - 287 pg/mL    Comment: The following cut-points have been suggested for the use of proBNP for the diagnostic evaluation of heart failure (HF) in patients with acute dyspnea: Modality                     Age           Optimal Cut                            (years)            Point ------------------------------------------------------ Diagnosis (rule in HF)        <50            450 pg/mL                           50 - 75            900 pg/mL                               >  75           1800 pg/mL Exclusion (rule out HF)  Age independent     300 pg/mL   CBC with Differential/Platelet     Status: Abnormal   Collection Time: 04/01/22  2:19 PM  Result Value Ref Range   WBC 11.1 (H) 4.0 - 10.5 K/uL   RBC 3.05 (L) 3.87 - 5.11 Mil/uL   Hemoglobin 7.7 Repeated and verified X2. (LL) 12.0 - 15.0 g/dL   HCT 24.2 (L) 36.0 - 46.0 %   MCV 79.3 78.0 - 100.0 fl   MCHC 31.8 30.0 - 36.0 g/dL   RDW 18.6 (H) 11.5 - 15.5 %   Platelets 443.0 (H) 150.0 - 400.0 K/uL   Neutrophils Relative % 68.6 43.0 - 77.0 %   Lymphocytes Relative 21.0 12.0 - 46.0 %   Monocytes Relative 8.3 3.0 - 12.0 %   Eosinophils Relative 1.2 0.0 - 5.0 %   Basophils Relative 0.9 0.0 - 3.0 %   Neutro Abs 7.6 1.4 - 7.7 K/uL   Lymphs Abs 2.3 0.7 - 4.0 K/uL   Monocytes Absolute 0.9 0.1 - 1.0  K/uL   Eosinophils Absolute 0.1 0.0 - 0.7 K/uL   Basophils Absolute 0.1 0.0 - 0.1 K/uL    RADIOGRAPHIC STUDIES: I have personally reviewed the radiological images as listed and agreed with the findings in the report. No results found.  ASSESSMENT & PLAN: 1.  Iron deficiency anemia - Seen in the request of Dr. Gerarda Fraction - Hospitalized in [place] from 03/28/2022 to 03/29/2022 for suspected upper GI bleed: Hgb 5.1, stool guaiac positive, symptomatic with fatigue, dyspnea, and syncope.  Received 2 units PRBC and IV Venofer.  Hgb at discharge 7.1 with ferritin 4.9 and serum iron 6. - Patient was on Eliquis at the time of hospitalization (anticoagulation in the setting of planned electrocardioversion for symptomatic atrial fibrillation).  Eliquis held at hospital discharge. - EGD (04/02/2022) by Dr. Henrene Pastor significant for moderate-sized sliding hiatal hernia, no cause for iron deficiency anemia was found.  Patient to be scheduled for capsule endoscopy to rule out small bowel lesion. - *** - PLAN: We will schedule patient for IV iron repletion with ***. - Repeat CBC and iron panel today with sample to blood bank. - We will check additional anemia labs including nutritional panel (B12, MMA, folate, copper), reticulocytes, LDH, and myeloma panel (SPEP, IFE, FLC). - RTC in 2 to 3 weeks to discuss results ***  2.  Other history - PMH: *** Atrial fibrillation, hypertension, morbid obesity *** - SOCIAL: *** Former smoker *** Social drinker *** No drugs  *** Works for [employer] as ***. - FAMILY: ***   PLAN SUMMARY & DISPOSITION: ***  All questions were answered. The patient knows to call the clinic with any problems, questions or concerns.   Medical decision making: ***  Time spent on visit: I spent *** minutes counseling the patient face to face. The total time spent in the appointment was *** minutes and more than 50% was on counseling.  I, Tarri Abernethy PA-C, have seen this patient  in conjunction with Dr. Derek Jack.  Greater than 50% of visit was performed by Dr. Delton Coombes.   Harriett Rush, PA-C *** ***  DR. Delton CoombesMarland Kitchen

## 2022-05-04 ENCOUNTER — Other Ambulatory Visit (HOSPITAL_COMMUNITY)
Admission: RE | Admit: 2022-05-04 | Discharge: 2022-05-04 | Disposition: A | Discharge status: Home / Self Care | Payer: PRIVATE HEALTH INSURANCE | Source: Ambulatory Visit | Attending: Physician Assistant | Admitting: Physician Assistant

## 2022-05-04 ENCOUNTER — Other Ambulatory Visit: Payer: PRIVATE HEALTH INSURANCE

## 2022-05-04 ENCOUNTER — Other Ambulatory Visit: Payer: Self-pay

## 2022-05-04 ENCOUNTER — Inpatient Hospital Stay: Payer: PRIVATE HEALTH INSURANCE | Attending: Hematology | Admitting: Hematology

## 2022-05-04 ENCOUNTER — Telehealth: Payer: Self-pay

## 2022-05-04 ENCOUNTER — Encounter: Payer: Self-pay | Admitting: Hematology

## 2022-05-04 ENCOUNTER — Encounter: Payer: Self-pay | Admitting: Physician Assistant

## 2022-05-04 VITALS — BP 122/68 | HR 79 | Temp 98.4°F | Resp 16 | Ht 62.0 in | Wt 192.7 lb

## 2022-05-04 DIAGNOSIS — I4891 Unspecified atrial fibrillation: Secondary | ICD-10-CM | POA: Diagnosis not present

## 2022-05-04 DIAGNOSIS — R55 Syncope and collapse: Secondary | ICD-10-CM | POA: Insufficient documentation

## 2022-05-04 DIAGNOSIS — Z87891 Personal history of nicotine dependence: Secondary | ICD-10-CM | POA: Diagnosis not present

## 2022-05-04 DIAGNOSIS — R002 Palpitations: Secondary | ICD-10-CM | POA: Insufficient documentation

## 2022-05-04 DIAGNOSIS — Z79899 Other long term (current) drug therapy: Secondary | ICD-10-CM | POA: Insufficient documentation

## 2022-05-04 DIAGNOSIS — Z836 Family history of other diseases of the respiratory system: Secondary | ICD-10-CM | POA: Diagnosis not present

## 2022-05-04 DIAGNOSIS — K921 Melena: Secondary | ICD-10-CM | POA: Insufficient documentation

## 2022-05-04 DIAGNOSIS — R5383 Other fatigue: Secondary | ICD-10-CM | POA: Diagnosis not present

## 2022-05-04 DIAGNOSIS — Z8249 Family history of ischemic heart disease and other diseases of the circulatory system: Secondary | ICD-10-CM | POA: Diagnosis not present

## 2022-05-04 DIAGNOSIS — K449 Diaphragmatic hernia without obstruction or gangrene: Secondary | ICD-10-CM | POA: Insufficient documentation

## 2022-05-04 DIAGNOSIS — M1990 Unspecified osteoarthritis, unspecified site: Secondary | ICD-10-CM | POA: Insufficient documentation

## 2022-05-04 DIAGNOSIS — D75838 Other thrombocytosis: Secondary | ICD-10-CM | POA: Insufficient documentation

## 2022-05-04 DIAGNOSIS — D509 Iron deficiency anemia, unspecified: Secondary | ICD-10-CM | POA: Diagnosis present

## 2022-05-04 DIAGNOSIS — Z9049 Acquired absence of other specified parts of digestive tract: Secondary | ICD-10-CM | POA: Insufficient documentation

## 2022-05-04 DIAGNOSIS — R42 Dizziness and giddiness: Secondary | ICD-10-CM | POA: Diagnosis not present

## 2022-05-04 DIAGNOSIS — E669 Obesity, unspecified: Secondary | ICD-10-CM | POA: Insufficient documentation

## 2022-05-04 DIAGNOSIS — D500 Iron deficiency anemia secondary to blood loss (chronic): Secondary | ICD-10-CM | POA: Diagnosis not present

## 2022-05-04 DIAGNOSIS — T184XXA Foreign body in colon, initial encounter: Secondary | ICD-10-CM

## 2022-05-04 DIAGNOSIS — Z811 Family history of alcohol abuse and dependence: Secondary | ICD-10-CM | POA: Insufficient documentation

## 2022-05-04 DIAGNOSIS — Z833 Family history of diabetes mellitus: Secondary | ICD-10-CM | POA: Diagnosis not present

## 2022-05-04 DIAGNOSIS — D649 Anemia, unspecified: Secondary | ICD-10-CM | POA: Insufficient documentation

## 2022-05-04 DIAGNOSIS — R0600 Dyspnea, unspecified: Secondary | ICD-10-CM | POA: Insufficient documentation

## 2022-05-04 LAB — COMPREHENSIVE METABOLIC PANEL
ALT: 24 U/L (ref 0–44)
AST: 22 U/L (ref 15–41)
Albumin: 4 g/dL (ref 3.5–5.0)
Alkaline Phosphatase: 59 U/L (ref 38–126)
Anion gap: 10 (ref 5–15)
BUN: 15 mg/dL (ref 8–23)
CO2: 23 mmol/L (ref 22–32)
Calcium: 8.7 mg/dL — ABNORMAL LOW (ref 8.9–10.3)
Chloride: 105 mmol/L (ref 98–111)
Creatinine, Ser: 0.94 mg/dL (ref 0.44–1.00)
GFR, Estimated: >60 mL/min (ref >60)
Glucose, Bld: 87 mg/dL (ref 70–99)
Potassium: 3.8 mmol/L (ref 3.5–5.1)
Sodium: 138 mmol/L (ref 135–145)
Total Bilirubin: 0.8 mg/dL (ref 0.3–1.2)
Total Protein: 7 g/dL (ref 6.5–8.1)

## 2022-05-04 LAB — CBC WITH DIFFERENTIAL/PLATELET
Abs Immature Granulocytes: 0.02 10*3/uL (ref 0.00–0.07)
Basophils Absolute: 0.1 10*3/uL (ref 0.0–0.1)
Basophils Relative: 1 %
Eosinophils Absolute: 0.2 10*3/uL (ref 0.0–0.5)
Eosinophils Relative: 2 %
HCT: 37.7 % (ref 36.0–46.0)
Hemoglobin: 11.5 g/dL — ABNORMAL LOW (ref 12.0–15.0)
Immature Granulocytes: 0 %
Lymphocytes Relative: 27 %
Lymphs Abs: 1.8 10*3/uL (ref 0.7–4.0)
MCH: 27.3 pg (ref 26.0–34.0)
MCHC: 30.5 g/dL (ref 30.0–36.0)
MCV: 89.3 fL (ref 80.0–100.0)
Monocytes Absolute: 0.5 10*3/uL (ref 0.1–1.0)
Monocytes Relative: 8 %
Neutro Abs: 4.3 10*3/uL (ref 1.7–7.7)
Neutrophils Relative %: 62 %
Platelets: 344 10*3/uL (ref 150–400)
RBC: 4.22 MIL/uL (ref 3.87–5.11)
RDW: 22.3 % — ABNORMAL HIGH (ref 11.5–15.5)
WBC: 6.8 10*3/uL (ref 4.0–10.5)
nRBC: 0 % (ref 0.0–0.2)

## 2022-05-04 LAB — RETICULOCYTES
Immature Retic Fract: 16 % — ABNORMAL HIGH (ref 2.3–15.9)
RBC.: 4.26 MIL/uL (ref 3.87–5.11)
Retic Count, Absolute: 103.1 10*3/uL (ref 19.0–186.0)
Retic Ct Pct: 2.4 % (ref 0.4–3.1)

## 2022-05-04 LAB — IRON AND TIBC
Iron: 97 ug/dL (ref 28–170)
Saturation Ratios: 24 % (ref 10.4–31.8)
TIBC: 411 ug/dL (ref 250–450)
UIBC: 314 ug/dL

## 2022-05-04 LAB — FOLATE: Folate: 8.9 ng/mL (ref >5.9)

## 2022-05-04 LAB — VITAMIN B12: Vitamin B-12: 210 pg/mL (ref 180–914)

## 2022-05-04 LAB — FERRITIN: Ferritin: 39 ng/mL (ref 11–307)

## 2022-05-04 LAB — LACTATE DEHYDROGENASE: LDH: 126 U/L (ref 98–192)

## 2022-05-04 MED ORDER — FERROUS SULFATE 325 (65 FE) MG PO TBEC: 325.0000 mg | DELAYED_RELEASE_TABLET | Freq: Two times a day (BID) | ORAL | 3 refills | Status: AC

## 2022-05-04 NOTE — Telephone Encounter (Signed)
Please come to the lab in the basement M-F between the hours of 7:30am-5pm to have the blood drawn, you do not need an appt. You can also go to the xray dept in the basement to have an xray of abdomen to make sure you passed the capsule. The iron prescription has been sent to your pharmacy. We have scheduled you to see Dr. Henrene Pastor 06/03/22 at [place].  Pt aware see my chart message.

## 2022-05-04 NOTE — Patient Instructions (Signed)
[name] at [facility] **   You were seen today by Dr. Delton Coombes and Tarri Abernethy PA-C for your iron deficiency anemia.    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  Continue to follow-up with your gastroenterologist (Dr. Henrene Pastor) for management of these lesions. We will schedule you for IV iron x3 doses.  (Please see the attached handout for important information regarding possible side effects) Continue to take your iron pill ONCE daily, as it is better absorbed by your stomach if taken only daily. We will check labs today to make sure you do not have any other causes of your anemia.  FOLLOW-UP APPOINTMENT: Same-day labs and office visit in 8 weeks.  ** Thank you for trusting me with your healthcare!  I strive to provide all of my patients with quality care at each visit.  If you receive a survey for this visit, I would be so grateful to you for taking the time to provide feedback.  Thank you in advance!  ~ Lewayne Pauley                   Dr. Derek Jack   &   Tarri Abernethy, PA-C   - - - - - - - - - - - - - - - - - -    Thank you for choosing [place] at [facility] to provide your oncology and hematology care.  To afford each patient quality time with our provider, please arrive at least 15 minutes before your scheduled appointment time.   If you have a lab appointment with the [facility] please come in thru the Main Entrance and check in at the main information desk.  You need to re-schedule your appointment should you arrive 10 or more minutes late.  We strive to give you quality time with our providers, and arriving late affects you and other patients whose appointments are after yours.  Also, if you no show three or more times for appointments you may be dismissed from the clinic at  the providers discretion.     Again, thank you for choosing [facility].  Our hope is that these requests will decrease the amount of time that you wait before being seen by our physicians.       _____________________________________________________________  Should you have questions after your visit to [facility], please contact our office at [phone] and follow the prompts.  Our office hours are 8:00 a.m. and 4:30 p.m. Monday - Friday.  Please note that voicemails left after 4:00 p.m. may not be returned until the following business day.  We are closed weekends and major holidays.  You do have access to a nurse 24-7, just call the main number to the clinic [phone] and do not press any options, hold on the line and a nurse will answer the phone.    For prescription refill requests, have your pharmacy contact our office and allow 72 hours.

## 2022-05-04 NOTE — Telephone Encounter (Signed)
-----   Message from [name], MD sent at 05/01/2022  2:25 PM EDT ----- Regarding: Capsule endoscopy results [name],  Please let the patient know that we have completed the reading of her capsule endoscopy performed on April 21, 2022. 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  This explains her anemia, low iron, and dark stools. 1.  Make sure she is taking an iron supplement twice daily.  Please prescribe if needed (iron sulfate 325 mg twice daily). 2.  Have her come next week for CBC and ferritin 3.  Schedule her to see me in the office to further discuss these results and plans.  Thanks,  Dr. Domenica Fail to phone note for the record.

## 2022-05-05 ENCOUNTER — Other Ambulatory Visit: Payer: Self-pay | Admitting: Hematology

## 2022-05-05 ENCOUNTER — Other Ambulatory Visit: Payer: Self-pay | Admitting: Physician Assistant

## 2022-05-05 ENCOUNTER — Encounter: Payer: Self-pay | Admitting: Physician Assistant

## 2022-05-05 DIAGNOSIS — D500 Iron deficiency anemia secondary to blood loss (chronic): Secondary | ICD-10-CM

## 2022-05-05 LAB — KAPPA/LAMBDA LIGHT CHAINS
Kappa free light chain: 15.7 mg/L (ref 3.3–19.4)
Kappa, lambda light chain ratio: 1.38 (ref 0.26–1.65)
Lambda free light chains: 11.4 mg/L (ref 5.7–26.3)

## 2022-05-06 ENCOUNTER — Ambulatory Visit (INDEPENDENT_AMBULATORY_CARE_PROVIDER_SITE_OTHER)
Admission: RE | Admit: 2022-05-06 | Discharge: 2022-05-06 | Disposition: A | Discharge status: Home / Self Care | Payer: PRIVATE HEALTH INSURANCE | Source: Ambulatory Visit | Attending: Internal Medicine | Admitting: Internal Medicine

## 2022-05-06 DIAGNOSIS — D509 Iron deficiency anemia, unspecified: Secondary | ICD-10-CM | POA: Diagnosis not present

## 2022-05-06 DIAGNOSIS — T184XXA Foreign body in colon, initial encounter: Secondary | ICD-10-CM | POA: Diagnosis not present

## 2022-05-06 LAB — METHYLMALONIC ACID, SERUM: Methylmalonic Acid, Quantitative: 134 nmol/L (ref 0–378)

## 2022-05-07 ENCOUNTER — Inpatient Hospital Stay: Payer: PRIVATE HEALTH INSURANCE

## 2022-05-07 VITALS — BP 110/41 | HR 88 | Temp 98.9°F | Resp 18

## 2022-05-07 DIAGNOSIS — D509 Iron deficiency anemia, unspecified: Secondary | ICD-10-CM | POA: Diagnosis not present

## 2022-05-07 DIAGNOSIS — D500 Iron deficiency anemia secondary to blood loss (chronic): Secondary | ICD-10-CM

## 2022-05-07 LAB — PROTEIN ELECTROPHORESIS, SERUM
A/G Ratio: 1.1 (ref 0.7–1.7)
Albumin ELP: 3.3 g/dL (ref 2.9–4.4)
Alpha-1-Globulin: 0.6 g/dL — ABNORMAL HIGH (ref 0.0–0.4)
Alpha-2-Globulin: 0.8 g/dL (ref 0.4–1.0)
Beta Globulin: 1 g/dL (ref 0.7–1.3)
Gamma Globulin: 0.6 g/dL (ref 0.4–1.8)
Globulin, Total: 2.9 g/dL (ref 2.2–3.9)
Total Protein ELP: 6.2 g/dL (ref 6.0–8.5)

## 2022-05-07 MED ORDER — SODIUM CHLORIDE 0.9 % IV SOLN: Freq: Once | INTRAVENOUS | Status: AC

## 2022-05-07 MED ORDER — LORATADINE 10 MG PO TABS
10.0000 mg | ORAL_TABLET | Freq: Once | ORAL | Status: AC
  Administered 2022-05-07: 10 mg via ORAL
  Filled 2022-05-07: qty 1

## 2022-05-07 MED ORDER — SODIUM CHLORIDE 0.9 % IV SOLN
300.0000 mg | Freq: Once | INTRAVENOUS | Status: AC
  Administered 2022-05-07: 300 mg via INTRAVENOUS
  Filled 2022-05-07: qty 300

## 2022-05-07 MED ORDER — ACETAMINOPHEN 325 MG PO TABS
650.0000 mg | ORAL_TABLET | Freq: Once | ORAL | Status: AC
  Administered 2022-05-07: 650 mg via ORAL
  Filled 2022-05-07: qty 2

## 2022-05-07 NOTE — Patient Instructions (Signed)
[facility]  Discharge Instructions: Thank you for choosing [facility] to provide your oncology and hematology care.  If you have a lab appointment with the Cancer Center, please come in thru the Main Entrance and check in at the main information desk.  Wear comfortable clothing and clothing appropriate for easy access to any Portacath or PICC line.   We strive to give you quality time with your provider. You may need to reschedule your appointment if you arrive late (15 or more minutes).  Arriving late affects you and other patients whose appointments are after yours.  Also, if you miss three or more appointments without notifying the office, you may be dismissed from the clinic at the provider's discretion.      For prescription refill requests, have your pharmacy contact our office and allow 72 hours for refills to be completed.     To help prevent nausea and vomiting after your treatment, we encourage you to take your nausea medication as directed.  BELOW ARE SYMPTOMS THAT SHOULD BE REPORTED IMMEDIATELY: *FEVER GREATER THAN 100.4 F (38 C) OR HIGHER *CHILLS OR SWEATING *NAUSEA AND VOMITING THAT IS NOT CONTROLLED WITH YOUR NAUSEA MEDICATION *UNUSUAL SHORTNESS OF BREATH *UNUSUAL BRUISING OR BLEEDING *URINARY PROBLEMS (pain or burning when urinating, or frequent urination) *BOWEL PROBLEMS (unusual diarrhea, constipation, pain near the anus) TENDERNESS IN MOUTH AND THROAT WITH OR WITHOUT PRESENCE OF ULCERS (sore throat, sores in mouth, or a toothache) UNUSUAL RASH, SWELLING OR PAIN  UNUSUAL VAGINAL DISCHARGE OR ITCHING   Items with * indicate a potential emergency and should be followed up as soon as possible or go to the Emergency Department if any problems should occur.  Please show the CHEMOTHERAPY ALERT CARD or IMMUNOTHERAPY ALERT CARD at check-in to the Emergency Department and triage nurse.  Should you have questions after your visit or need to  cancel or reschedule your appointment, please contact [facility] [phone]  and follow the prompts.  Office hours are 8:00 a.m. to 4:30 p.m. Monday - Friday. Please note that voicemails left after 4:00 p.m. may not be returned until the following business day.  We are closed weekends and major holidays. You have access to a nurse at all times for urgent questions. Please call the main number to the clinic [phone] and follow the prompts.  For any non-urgent questions, you may also contact your provider using MyChart. We now offer e-Visits for anyone 18 and older to request care online for non-urgent symptoms. For details visit [email or web address].   Also download the MyChart app! Go to the app store, search "MyChart", open the app, select [facility], and log in with your MyChart username and password.  Masks are optional in the cancer centers. If you would like for your care team to wear a mask while they are taking care of you, please let them know. You may have one support person who is at least 62 years old accompany you for your appointments.  

## 2022-05-07 NOTE — Progress Notes (Signed)
Patient presents today for iron infusion.  Patient is in satisfactory condition with no complaints voiced.  Vital signs are stable.  We will proceed with infusion per MD orders.   Patient tolerated treatment well with no complaints voiced.  Patient left ambulatory in stable condition.  Vital signs stable at discharge.  Follow up as scheduled.

## 2022-05-08 LAB — IMMUNOFIXATION ELECTROPHORESIS
IgA: 204 mg/dL (ref 87–352)
IgG (Immunoglobin G), Serum: 648 mg/dL (ref 586–1602)
IgM (Immunoglobulin M), Srm: 85 mg/dL (ref 26–217)
Total Protein ELP: 6.4 g/dL (ref 6.0–8.5)

## 2022-05-09 LAB — COPPER, SERUM: Copper: 135 ug/dL (ref 80–158)

## 2022-05-14 ENCOUNTER — Inpatient Hospital Stay: Payer: PRIVATE HEALTH INSURANCE | Attending: Physician Assistant

## 2022-05-14 VITALS — BP 117/62 | HR 87 | Temp 99.3°F | Resp 18

## 2022-05-14 DIAGNOSIS — Z8249 Family history of ischemic heart disease and other diseases of the circulatory system: Secondary | ICD-10-CM | POA: Diagnosis not present

## 2022-05-14 DIAGNOSIS — D500 Iron deficiency anemia secondary to blood loss (chronic): Secondary | ICD-10-CM | POA: Insufficient documentation

## 2022-05-14 DIAGNOSIS — Z87891 Personal history of nicotine dependence: Secondary | ICD-10-CM | POA: Diagnosis not present

## 2022-05-14 DIAGNOSIS — Z836 Family history of other diseases of the respiratory system: Secondary | ICD-10-CM | POA: Diagnosis not present

## 2022-05-14 DIAGNOSIS — Z833 Family history of diabetes mellitus: Secondary | ICD-10-CM | POA: Insufficient documentation

## 2022-05-14 DIAGNOSIS — K922 Gastrointestinal hemorrhage, unspecified: Secondary | ICD-10-CM | POA: Insufficient documentation

## 2022-05-14 DIAGNOSIS — Z811 Family history of alcohol abuse and dependence: Secondary | ICD-10-CM | POA: Diagnosis not present

## 2022-05-14 DIAGNOSIS — Z79899 Other long term (current) drug therapy: Secondary | ICD-10-CM | POA: Diagnosis not present

## 2022-05-14 MED ORDER — SODIUM CHLORIDE 0.9 % IV SOLN: Freq: Once | INTRAVENOUS | Status: AC

## 2022-05-14 MED ORDER — SODIUM CHLORIDE 0.9 % IV SOLN
300.0000 mg | Freq: Once | INTRAVENOUS | Status: AC
  Administered 2022-05-14: 300 mg via INTRAVENOUS
  Filled 2022-05-14: qty 300

## 2022-05-14 NOTE — Progress Notes (Signed)
Patient presents today for iron infusion.  Patient is in satisfactory condition with no complaints voiced. Vital signs are stable.  Pre-medications taken at 0730 at home prior to visit.  We will proceed with infusion per MD orders.   Patient tolerated treatment well with no complaints voiced.  Patient left ambulatory in stable condition.  Vital signs stable at discharge.  Follow up as scheduled.

## 2022-05-14 NOTE — Patient Instructions (Signed)
[facility]  Discharge Instructions: Thank you for choosing [facility] to provide your oncology and hematology care.  If you have a lab appointment with the Cancer Center, please come in thru the Main Entrance and check in at the main information desk.  Wear comfortable clothing and clothing appropriate for easy access to any Portacath or PICC line.   We strive to give you quality time with your provider. You may need to reschedule your appointment if you arrive late (15 or more minutes).  Arriving late affects you and other patients whose appointments are after yours.  Also, if you miss three or more appointments without notifying the office, you may be dismissed from the clinic at the provider's discretion.      For prescription refill requests, have your pharmacy contact our office and allow 72 hours for refills to be completed.     To help prevent nausea and vomiting after your treatment, we encourage you to take your nausea medication as directed.  BELOW ARE SYMPTOMS THAT SHOULD BE REPORTED IMMEDIATELY: *FEVER GREATER THAN 100.4 F (38 C) OR HIGHER *CHILLS OR SWEATING *NAUSEA AND VOMITING THAT IS NOT CONTROLLED WITH YOUR NAUSEA MEDICATION *UNUSUAL SHORTNESS OF BREATH *UNUSUAL BRUISING OR BLEEDING *URINARY PROBLEMS (pain or burning when urinating, or frequent urination) *BOWEL PROBLEMS (unusual diarrhea, constipation, pain near the anus) TENDERNESS IN MOUTH AND THROAT WITH OR WITHOUT PRESENCE OF ULCERS (sore throat, sores in mouth, or a toothache) UNUSUAL RASH, SWELLING OR PAIN  UNUSUAL VAGINAL DISCHARGE OR ITCHING   Items with * indicate a potential emergency and should be followed up as soon as possible or go to the Emergency Department if any problems should occur.  Please show the CHEMOTHERAPY ALERT CARD or IMMUNOTHERAPY ALERT CARD at check-in to the Emergency Department and triage nurse.  Should you have questions after your visit or need to  cancel or reschedule your appointment, please contact [facility] [phone]  and follow the prompts.  Office hours are 8:00 a.m. to 4:30 p.m. Monday - Friday. Please note that voicemails left after 4:00 p.m. may not be returned until the following business day.  We are closed weekends and major holidays. You have access to a nurse at all times for urgent questions. Please call the main number to the clinic [phone] and follow the prompts.  For any non-urgent questions, you may also contact your provider using MyChart. We now offer e-Visits for anyone 18 and older to request care online for non-urgent symptoms. For details visit [email or web address].   Also download the MyChart app! Go to the app store, search "MyChart", open the app, select [facility], and log in with your MyChart username and password.  Masks are optional in the cancer centers. If you would like for your care team to wear a mask while they are taking care of you, please let them know. You may have one support person who is at least 62 years old accompany you for your appointments.  

## 2022-05-21 ENCOUNTER — Inpatient Hospital Stay: Payer: PRIVATE HEALTH INSURANCE

## 2022-05-21 VITALS — BP 127/68 | HR 82 | Temp 98.9°F | Resp 18

## 2022-05-21 DIAGNOSIS — D500 Iron deficiency anemia secondary to blood loss (chronic): Secondary | ICD-10-CM

## 2022-05-21 MED ORDER — SODIUM CHLORIDE 0.9 % IV SOLN
300.0000 mg | Freq: Once | INTRAVENOUS | Status: AC
  Administered 2022-05-21: 300 mg via INTRAVENOUS
  Filled 2022-05-21: qty 300

## 2022-05-21 MED ORDER — SODIUM CHLORIDE 0.9 % IV SOLN: Freq: Once | INTRAVENOUS | Status: AC

## 2022-05-21 NOTE — Patient Instructions (Signed)
[facility]  Discharge Instructions: Thank you for choosing [facility] to provide your oncology and hematology care.  If you have a lab appointment with the Cancer Center, please come in thru the Main Entrance and check in at the main information desk.  Wear comfortable clothing and clothing appropriate for easy access to any Portacath or PICC line.   We strive to give you quality time with your provider. You may need to reschedule your appointment if you arrive late (15 or more minutes).  Arriving late affects you and other patients whose appointments are after yours.  Also, if you miss three or more appointments without notifying the office, you may be dismissed from the clinic at the provider's discretion.      For prescription refill requests, have your pharmacy contact our office and allow 72 hours for refills to be completed.    Today you received the following Venofer, return as scheduled.   To help prevent nausea and vomiting after your treatment, we encourage you to take your nausea medication as directed.  BELOW ARE SYMPTOMS THAT SHOULD BE REPORTED IMMEDIATELY: *FEVER GREATER THAN 100.4 F (38 C) OR HIGHER *CHILLS OR SWEATING *NAUSEA AND VOMITING THAT IS NOT CONTROLLED WITH YOUR NAUSEA MEDICATION *UNUSUAL SHORTNESS OF BREATH *UNUSUAL BRUISING OR BLEEDING *URINARY PROBLEMS (pain or burning when urinating, or frequent urination) *BOWEL PROBLEMS (unusual diarrhea, constipation, pain near the anus) TENDERNESS IN MOUTH AND THROAT WITH OR WITHOUT PRESENCE OF ULCERS (sore throat, sores in mouth, or a toothache) UNUSUAL RASH, SWELLING OR PAIN  UNUSUAL VAGINAL DISCHARGE OR ITCHING   Items with * indicate a potential emergency and should be followed up as soon as possible or go to the Emergency Department if any problems should occur.  Please show the CHEMOTHERAPY ALERT CARD or IMMUNOTHERAPY ALERT CARD at check-in to the Emergency Department and triage  nurse.  Should you have questions after your visit or need to cancel or reschedule your appointment, please contact [facility] [phone]  and follow the prompts.  Office hours are 8:00 a.m. to 4:30 p.m. Monday - Friday. Please note that voicemails left after 4:00 p.m. may not be returned until the following business day.  We are closed weekends and major holidays. You have access to a nurse at all times for urgent questions. Please call the main number to the clinic [phone] and follow the prompts.  For any non-urgent questions, you may also contact your provider using MyChart. We now offer e-Visits for anyone 18 and older to request care online for non-urgent symptoms. For details visit [email or web address].   Also download the MyChart app! Go to the app store, search "MyChart", open the app, select , and log in with your MyChart username and password.  Masks are optional in the cancer centers. If you would like for your care team to wear a mask while they are taking care of you, please let them know. You may have one support person who is at least 62 years old accompany you for your appointments.  

## 2022-05-21 NOTE — Progress Notes (Signed)
Patient presents today for iron infusion, patient reports taking pre-medications at home. Patient tolerated iron infusion with no complaints voiced. Peripheral IV site clean and dry with good blood return noted before and after infusion. Band aid applied. VSS with discharge and left in satisfactory condition with no s/s of distress noted.

## 2022-06-03 ENCOUNTER — Other Ambulatory Visit (INDEPENDENT_AMBULATORY_CARE_PROVIDER_SITE_OTHER): Payer: PRIVATE HEALTH INSURANCE

## 2022-06-03 ENCOUNTER — Ambulatory Visit (INDEPENDENT_AMBULATORY_CARE_PROVIDER_SITE_OTHER): Payer: PRIVATE HEALTH INSURANCE | Admitting: Internal Medicine

## 2022-06-03 ENCOUNTER — Encounter: Payer: Self-pay | Admitting: Internal Medicine

## 2022-06-03 VITALS — BP 126/68 | HR 98 | Ht 62.0 in

## 2022-06-03 DIAGNOSIS — K5521 Angiodysplasia of colon with hemorrhage: Secondary | ICD-10-CM

## 2022-06-03 DIAGNOSIS — D500 Iron deficiency anemia secondary to blood loss (chronic): Secondary | ICD-10-CM

## 2022-06-03 LAB — CBC WITH DIFFERENTIAL/PLATELET
Basophils Absolute: 0.1 10*3/uL (ref 0.0–0.1)
Basophils Relative: 0.5 % (ref 0.0–3.0)
Eosinophils Absolute: 0 10*3/uL (ref 0.0–0.7)
Eosinophils Relative: 0.3 % (ref 0.0–5.0)
HCT: 44.1 % (ref 36.0–46.0)
Hemoglobin: 14.3 g/dL (ref 12.0–15.0)
Lymphocytes Relative: 15 % (ref 12.0–46.0)
Lymphs Abs: 1.6 10*3/uL (ref 0.7–4.0)
MCHC: 32.4 g/dL (ref 30.0–36.0)
MCV: 89.2 fl (ref 78.0–100.0)
Monocytes Absolute: 0.7 10*3/uL (ref 0.1–1.0)
Monocytes Relative: 6.8 % (ref 3.0–12.0)
Neutro Abs: 8.2 10*3/uL — ABNORMAL HIGH (ref 1.4–7.7)
Neutrophils Relative %: 77.4 % — ABNORMAL HIGH (ref 43.0–77.0)
Platelets: 301 10*3/uL (ref 150.0–400.0)
RBC: 4.94 Mil/uL (ref 3.87–5.11)
RDW: 21.4 % — ABNORMAL HIGH (ref 11.5–15.5)
WBC: 10.6 10*3/uL — ABNORMAL HIGH (ref 4.0–10.5)

## 2022-06-03 LAB — FERRITIN: Ferritin: 128.4 ng/mL (ref 10.0–291.0)

## 2022-06-03 NOTE — Patient Instructions (Signed)
_______________________________________________________  If you are age 62 or older, your body mass index should be between 23-30. Your Body mass index is 35.24 kg/m. If this is out of the aforementioned range listed, please consider follow up with your Primary Care Provider.  If you are age 26 or younger, your body mass index should be between 19-25. Your Body mass index is 35.24 kg/m. If this is out of the aformentioned range listed, please consider follow up with your Primary Care Provider.   ________________________________________________________  The [facility] providers would like to encourage you to use [facility] to communicate with providers for non-urgent requests or questions.  Due to long hold times on the telephone, sending your provider a message by [facility] may be a faster and more efficient way to get a response.  Please allow 48 business hours for a response.  Please remember that this is for non-urgent requests.  _______________________________________________________  Your provider has requested that you go to the basement level for lab work before leaving today. Press "B" on the elevator. The lab is located at the first door on the left as you exit the elevator.

## 2022-06-03 NOTE — Progress Notes (Signed)
HISTORY OF PRESENT ILLNESS:  [name] is a 61 y.o. female with multiple medical problems as listed below.  She is followed in this office for:  1.  GERD 2.  History of adenomatous colon polyps 3.  Symptomatic iron deficiency anemia  She was last seen in this office April 01, 2022 regarding iron deficiency anemia and intermittent melena.  Just prior to that she was diagnosed with chronic atrial fibrillation and placed on Eliquis.  She had been using meloxicam.  She was continued on pantoprazole, told to hold NSAIDs, was holding Eliquis, and was set up for upper endoscopy.  Upper endoscopy was performed April 02, 2022.  This revealed an incidental distal esophageal stricture, moderate hiatal hernia without Cameron erosions, benign fundic gland type polyps.  Duodenal biopsies were normal.  She did have colonoscopy previously December 2022 which was normal except for diverticulosis.  She subsequently underwent capsule endoscopy which was performed April 21, 2022.  She was found to have probable AVM at 30-minute mark.  She was continued on iron therapy.  She saw hematology and has received 3 iron infusions.  She denies recurrent melena.  She states she is feeling very well and has her energy back.  Her peripheral edema and shortness of breath have resolved.  She continues to be off Eliquis but has plans for cardiology follow-up.  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   History of vulvar dysplasia    VIN3   Iron deficiency anemia due to chronic blood loss 05/05/2022   Lumbar spondylosis    OA (osteoarthritis)    PMB (postmenopausal bleeding)    Scoliosis    Wears glasses     Past Surgical History:  Procedure Laterality Date   ANTERIOR CERVICAL DECOMP/DISCECTOMY FUSION  11-25-2009  '@[facility]'$    C4 --- C7   ANTERIOR LAT LUMBAR FUSION Left 10/22/2016   Procedure: Anterior Lateral Lumbar Fusion - Lumbar two - lumbar three, with Lateral plate;  Surgeon: Kary Kos, MD;  Location: [facility];  Service: Neurosurgery;  Laterality: Left;  Anterior Lateral Lumbar Fusion - Lumbar two - lumbar three, with Lateral plate   CARDIAC CATHETERIZATION  age 77   CO2 LASER APPLICATION N/A 88/41/6606   Procedure: CO2 LASER APPLICATION;  Surgeon: Gus Height, MD;  Location: [facility];  Service: Gynecology;  Laterality: N/A;   COLONOSCOPY W/ POLYPECTOMY  06/21/2017   Dr.Miklo Aken   HYSTEROSCOPY WITH D & C N/A 08/27/2020   Procedure: DILATATION AND CURETTAGE /HYSTEROSCOPY;  Surgeon: Vanessa Kick, MD;  Location: [facility];  Service: Gynecology;  Laterality: N/A;   LAPAROSCOPIC CHOLECYSTECTOMY  04/2006   LEG SURGERY Right 1983   femoral artery repair from cardiac cath injury    OPEN  REDUCTION INTERNAL FIXATION (ORIF) METACARPAL Right 08/12/2017   Procedure: OPEN REDUCTION INTERNAL FIXATION (ORIF) 5th METACARPAL fracture;  Surgeon: Roseanne Kaufman, MD;  Location: [facility];  Service: Orthopedics;  Laterality: Right;  60 mins   SPINAL FIXATION SURGERY  1973   age 54 - sclosis   TOTAL HIP ARTHROPLASTY Right 06-16-2010 '@[facility]'$    UPPER GASTROINTESTINAL ENDOSCOPY     VSD REPAIR  age 74   VULVECTOMY N/A 09/18/2015   Procedure: WIDE EXCISION VULVECTOMY;  Surgeon: Vanessa Kick, MD;  Location: [facility];  Service: Gynecology;  Laterality: N/A;     Social History [name]  reports that she quit smoking about 19 years ago. Her smoking use included cigarettes. She has a 20.00 pack-year smoking history. She has never used smokeless tobacco. She reports current alcohol use. She reports that she does not use drugs.  family history includes Alcoholism in her father; COPD in her mother; Diabetes in her father and another family member; Heart disease in an other family member; Pneumonia in her father.  No Known Allergies     PHYSICAL EXAMINATION: Vital signs: BP 126/68   Pulse 98   Ht '5\' 2"'$  (1.575 m)   SpO2 99%   BMI 35.24 kg/m   Constitutional: generally well-appearing, no acute distress Psychiatric: alert and oriented x3, cooperative Eyes: extraocular movements intact, anicteric, conjunctiva pink Mouth: oral pharynx moist, no lesions Neck: supple no lymphadenopathy Cardiovascular: heart regular rate and rhythm, no murmur Lungs: clear to auscultation bilaterally Abdomen: soft, nontender, nondistended, no obvious ascites, no peritoneal signs, normal bowel sounds, no organomegaly Rectal: Omitted Extremities: no clubbing or cyanosis.  1+ lower extremity edema bilaterally Skin: no lesions on visible extremities Neuro: No focal deficits.  Cranial nerves intact  ASSESSMENT:  1.  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  She has responded nicely to iron replacement therapy, it appears. 2.  GERD.  Asymptomatic on PPI 3.  History of adenomatous colon polyps.  Surveillance colonoscopy up-to-date   PLAN:  1.  Recheck CBC and ferritin today 2.  Continue oral iron therapy indefinitely 3.  We discussed the role for small bowel enteroscopy, should it be needed. 4.  Continue PPI 5.  Surveillance colonoscopy around December 2027 6.  Routine GI office follow-up 1 year A total time of 30 minutes was spent preparing to see the patient, reviewing data, obtaining interval history, performing medically appropriate physical  examination, counseling the patient and her husband regarding the above listed issues, answering multiple excellent questions, ordering blood work, and documenting clinical information in the health record.  ADDENDUM: Blood work has returned today.  Hemoglobin is normal at 14.3.  Ferritin is normal at 128.4.  I have notified the patient of these results.

## 2022-06-15 ENCOUNTER — Ambulatory Visit
Admission: EM | Admit: 2022-06-15 | Discharge: 2022-06-15 | Disposition: A | Discharge status: Home / Self Care | Payer: PRIVATE HEALTH INSURANCE | Attending: Family Medicine | Admitting: Family Medicine

## 2022-06-15 DIAGNOSIS — N390 Urinary tract infection, site not specified: Secondary | ICD-10-CM | POA: Diagnosis present

## 2022-06-15 LAB — POCT URINALYSIS DIP (MANUAL ENTRY)
Bilirubin, UA: NEGATIVE
Glucose, UA: 100 mg/dL — AB
Nitrite, UA: POSITIVE — AB
Protein Ur, POC: 30 mg/dL — AB
Spec Grav, UA: 1.02 (ref 1.010–1.025)
Urobilinogen, UA: 1 E.U./dL
pH, UA: 5 (ref 5.0–8.0)

## 2022-06-15 MED ORDER — CEPHALEXIN 500 MG PO CAPS: 500.0000 mg | ORAL_CAPSULE | Freq: Two times a day (BID) | ORAL | 0 refills | Status: DC

## 2022-06-15 NOTE — ED Provider Notes (Signed)
[facility]    CSN: [id number] Arrival date & time: 06/15/22  1557      History   Chief Complaint Chief Complaint  Patient presents with   Dysuria    HPI [name] is a 61 y.o. female.   Patient presenting today with 1 day history of dysuria, urinary frequency, suprapubic pressure.  Denies fever, chills, nausea, vomiting, vaginal symptoms.  So far trying Azo as needed with minimal relief.  History of urinary tract infections.    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   History of vulvar dysplasia    VIN3   Iron deficiency anemia due to chronic blood loss 05/05/2022   Lumbar spondylosis    OA (osteoarthritis)    PMB (postmenopausal bleeding)    Scoliosis    Wears glasses     Patient Active Problem List   Diagnosis Date Noted   Iron deficiency anemia due to chronic blood loss 05/05/2022   Normocytic anemia 05/04/2022   H/O mitral valve repair    Arrhythmia 08/27/2020   Spinal stenosis 10/22/2016   Hip pain 11/29/2012   Lumbago 11/29/2012   Osteoarthritis 05/10/2012   S/P cholecystectomy 05/10/2012   GERD (gastroesophageal reflux disease) 05/10/2012    Past Surgical History:  Procedure Laterality Date   ANTERIOR CERVICAL DECOMP/DISCECTOMY FUSION  11-25-2009  '@[facility]'$    C4 --- C7   ANTERIOR LAT LUMBAR FUSION Left 10/22/2016   Procedure: Anterior Lateral Lumbar Fusion - Lumbar two - lumbar three, with  Lateral plate;  Surgeon: Kary Kos, MD;  Location: [place];  Service: Neurosurgery;  Laterality: Left;  Anterior Lateral Lumbar Fusion - Lumbar two - lumbar three, with Lateral plate   CARDIAC CATHETERIZATION  age 27   CO2 LASER APPLICATION N/A 56/21/3086   Procedure: CO2 LASER APPLICATION;  Surgeon: Gus Height, MD;  Location: [facility];  Service: Gynecology;  Laterality: N/A;   COLONOSCOPY W/ POLYPECTOMY  06/21/2017   Dr.Perry   HYSTEROSCOPY WITH D & C N/A 08/27/2020   Procedure: DILATATION AND CURETTAGE /HYSTEROSCOPY;  Surgeon: Vanessa Kick, MD;  Location: [place];  Service: Gynecology;  Laterality: N/A;   LAPAROSCOPIC CHOLECYSTECTOMY  04/2006   LEG SURGERY Right 1983   femoral artery repair from cardiac cath injury    OPEN REDUCTION INTERNAL FIXATION (ORIF) METACARPAL Right 08/12/2017   Procedure: OPEN REDUCTION INTERNAL FIXATION (ORIF) 5th METACARPAL fracture;  Surgeon: Roseanne Kaufman, MD;  Location: [place];  Service: Orthopedics;  Laterality: Right;  60 mins   SPINAL FIXATION SURGERY  1973   age 64 - sclosis   TOTAL HIP ARTHROPLASTY Right 06-16-2010 '@[facility]'$    UPPER GASTROINTESTINAL ENDOSCOPY     VSD REPAIR  age 32   VULVECTOMY N/A 09/18/2015   Procedure: WIDE EXCISION VULVECTOMY;  Surgeon: Vanessa Kick, MD;  Location: [facility];  Service: Gynecology;  Laterality: N/A;  OB History   No obstetric history on file.      Home Medications    Prior to Admission medications   Medication Sig Start Date End Date Taking? Authorizing Provider  cephALEXin (KEFLEX) 500 MG capsule Take 1 capsule (500 mg total) by mouth 2 (two) times daily. 06/15/22  Yes Volney American, PA-C  acetaminophen (TYLENOL) 650 MG CR tablet Take 1,300 mg by mouth every 8 (eight) hours as needed for pain.    [provider]  Collagen-Vitamin C-Biotin (COLLAGEN 1500/C PO) Take 3 capsules by mouth daily.    [provider]  diltiazem (CARDIZEM CD) 180 MG 24 hr capsule Take 1 capsule (180  mg total) by mouth daily. 09/09/21   Imogene Burn, PA-C  estrogens-methylTEST 1.25-2.5 MG TABS tablet Take 1 tablet by mouth daily.    [provider]  ferrous sulfate 325 (65 FE) MG EC tablet Take 1 tablet (325 mg total) by mouth 2 (two) times daily. Patient taking differently: Take 325 mg by mouth daily with breakfast. 05/04/22   [name], MD  furosemide (LASIX) 20 MG tablet Take 1 tablet (20 mg total) by mouth twice daily for 3 days only, then decrease to taking 1 tablet (20 mg total) by mouth daily thereafter. Patient not taking: Reported on 06/03/2022 03/24/22   Freada Bergeron, MD  medroxyPROGESTERone (PROVERA) 5 MG tablet Take 5 mg by mouth daily. 10/21/20   [provider]  OVER THE COUNTER MEDICATION Take 2 capsules by mouth daily. Zinc, vit C and Vit D    [provider]  pantoprazole (PROTONIX) 40 MG tablet Take 1 tablet (40 mg total) by mouth daily. 04/27/22   [name], MD  traMADol (ULTRAM) 50 MG tablet Take 100 mg by mouth every morning.    [provider]    Family History Family History  Problem Relation Age of Onset   COPD Mother    Diabetes Father    Pneumonia Father    Alcoholism Father    Diabetes Other    Heart disease Other    Colon cancer Neg Hx    Esophageal cancer Neg Hx    Stomach cancer Neg Hx     Social History Social History   Tobacco Use   Smoking status: Former    Packs/day: 1.00    Years: 20.00    Total pack years: 20.00    Types: Cigarettes    Quit date: 09/07/2002    Years since quitting: 19.7   Smokeless tobacco: Never  Vaping Use   Vaping Use: Never used  Substance Use Topics   Alcohol use: Yes    Comment: occ   Drug use: Never     Allergies   Patient has no known allergies.   Review of Systems Review of Systems Per HPI  Physical Exam Triage Vital Signs ED Triage Vitals  Enc Vitals Group     BP 06/15/22 1712 (!) 145/81     Pulse Rate 06/15/22 1712 93     Resp 06/15/22 1712 18      Temp 06/15/22 1712 97.9 F (36.6 C)     Temp Source 06/15/22 1712 Oral     SpO2 06/15/22 1712 94 %     Weight --      Height --      Head Circumference --      Peak Flow --      Pain Score 06/15/22 1714 0     Pain Loc --  Pain Edu? --      Excl. in [place]? --    No data found.  Updated Vital Signs BP (!) 145/81 (BP Location: Right Arm)   Pulse 93   Temp 97.9 F (36.6 C) (Oral)   Resp 18   SpO2 94%   Visual Acuity Right Eye Distance:   Left Eye Distance:   Bilateral Distance:    Right Eye Near:   Left Eye Near:    Bilateral Near:     Physical Exam Vitals and nursing note reviewed.  Constitutional:      Appearance: Normal appearance. She is not ill-appearing.  HENT:     Head: Atraumatic.     Mouth/Throat:     Mouth: Mucous membranes are moist.  Eyes:     Extraocular Movements: Extraocular movements intact.     Conjunctiva/sclera: Conjunctivae normal.  Cardiovascular:     Rate and Rhythm: Normal rate and regular rhythm.     Heart sounds: Normal heart sounds.  Pulmonary:     Effort: Pulmonary effort is normal.     Breath sounds: Normal breath sounds.  Abdominal:     General: Bowel sounds are normal. There is no distension.     Palpations: Abdomen is soft.     Tenderness: There is no abdominal tenderness. There is no right CVA tenderness, left CVA tenderness or guarding.  Musculoskeletal:        General: Normal range of motion.     Cervical back: Normal range of motion and neck supple.  Skin:    General: Skin is warm and dry.  Neurological:     Mental Status: She is alert and oriented to person, place, and time.     Motor: No weakness.     Gait: Gait normal.  Psychiatric:        Mood and Affect: Mood normal.        Thought Content: Thought content normal.        Judgment: Judgment normal.    UC Treatments / Results  Labs (all labs ordered are listed, but only abnormal results are displayed) Labs Reviewed  POCT URINALYSIS DIP (MANUAL ENTRY) -  Abnormal; Notable for the following components:      Result Value   Color, UA orange (*)    Clarity, UA cloudy (*)    Glucose, UA =100 (*)    Ketones, POC UA moderate (40) (*)    Blood, UA trace-intact (*)    Protein Ur, POC =30 (*)    Nitrite, UA Positive (*)    Leukocytes, UA Trace (*)    All other components within normal limits  URINE CULTURE    EKG   Radiology No results found.  Procedures Procedures (including critical care time)  Medications Ordered in UC Medications - No data to display  Initial Impression / Assessment and Plan / UC Course  I have reviewed the triage vital signs and the nursing notes.  Pertinent labs & imaging results that were available during my care of the patient were reviewed by me and considered in my medical decision making (see chart for details).     Vital signs and exam overall reassuring today, urinalysis showing evidence of urinary tract infection the patient has taken Azo so urine culture will be a better judge of precisely what is occurring.  We will start Keflex, continue Azo as needed in the meantime and adjust if needed based on urine culture.  Return for worsening symptoms.  Final Clinical Impressions(s) / UC Diagnoses  Final diagnoses:  Acute lower UTI   Discharge Instructions   None    ED Prescriptions     Medication Sig Dispense Auth. Provider   cephALEXin (KEFLEX) 500 MG capsule Take 1 capsule (500 mg total) by mouth 2 (two) times daily. 10 capsule Volney American, [place]      PDMP not reviewed this encounter.   Volney American, [place] 06/15/22 1824

## 2022-06-15 NOTE — ED Triage Notes (Signed)
Pt reports burning when urinating, increased urinary frequency x 1 day. Pt taking AZO.

## 2022-06-18 LAB — URINE CULTURE: Culture: >=100000 — AB

## 2022-06-21 NOTE — Progress Notes (Unsigned)
Office Visit    Patient Name: [name] Date of Encounter: 06/22/2022  PCP:  [facility] [facility] [place] [facility]  Cardiologist:  Freada Bergeron, MD  Advanced Practice Provider:  No care team member to display Electrophysiologist:  None   HPI    [name] is a 61 y.o. female with a past medical history significant for paroxysmal atrial fibrillation, prior VSD repair at age 67, MVP presents today for 22-monthfollow-up.  The patient was initially seen by cardiology 12/21 after she developed atrial fibrillation in the setting of D&C.  TTE with LVEF 66 5%, normal RV, mild LAE, possible mitral valve repair.  She was recommended for DCCV but she declined.  She was placed on diltiazem as she declined metoprolol.  She was last seen 03/2022 by Dr. PJohney Frame  She was having more symptoms associated with her atrial fibrillation.  She reported feeling "shaky", fatigue, and worsening shortness of breath with exertion.  When walking up the stairs, she experiences palpitations and shortness of breath which is not normal for her.  She had some associated lightheadedness which occurred especially if her heart rate was greater than 140 bpm.  Her husband also stated that she had experienced some edema for the past several months which is new.  Today, she  is very upset because last visit she was started on Eliquis and was anemic. She tells me she almost died and needed 4 pints of blood. She had a lower intestinal bleed. She is not interested in DCCV. She is in afib all the time but its rate controlled. She is asymptomatic. She also did not undergo stress test. She feels much better since her blood counts have increased.  She has stayed on iron.  No symptoms of chest pain or shortness of breath.  We reviewed her CHA2DS2-VASc score and she has a 0.6% chance of annual stroke.  Not interested in any anticoagulation.  Talked about the Watchman procedure as a possible  option.  Reports no shortness of breath nor dyspnea on exertion. Reports no chest pain, pressure, or tightness. No edema, orthopnea, PND. Reports no palpitations.    Past Medical History    Past Medical History:  Diagnosis Date   Arthritis    Cancer ([place]    Skin on left hand   DDD (degenerative disc disease), lumbar    DDD (degenerative disc disease), lumbosacral    GERD (gastroesophageal reflux disease)    Uses OTC medication as needed   History of avascular necrosis of capital femoral epiphysis    s/p THA right   History of mitral valve prolapse    per pt previous has seen cardiologist , dr gamble , yrs ago--- echo scanned in epic dated 04-22-2007 (scanned on dated 06-20-2010)  shows no evidence mvp, ef >55%, trace MR/TR and mild AV sclerosis but no stenosis or regurg.   History of vulvar dysplasia    VIN3   Iron deficiency anemia due to chronic blood loss 05/05/2022   Lumbar spondylosis    OA (osteoarthritis)    PMB (postmenopausal bleeding)    Scoliosis    Wears glasses    Past Surgical History:  Procedure Laterality Date   ANTERIOR CERVICAL DECOMP/DISCECTOMY FUSION  11-25-2009  _0    C4 --- C7   ANTERIOR LAT LUMBAR FUSION Left 10/22/2016   Procedure: Anterior Lateral Lumbar Fusion - Lumbar two - lumbar three, with Lateral plate;  Surgeon: GKary Kos MD;  [place]  Service: Neurosurgery;  Laterality: Left;  Anterior Lateral Lumbar Fusion - Lumbar two - lumbar three, with Lateral plate   CARDIAC CATHETERIZATION  age 67   CO2 LASER APPLICATION N/A 26/94/8546   Procedure: CO2 LASER APPLICATION;  Surgeon: Gus Height, MD;  Location: [facility];  Service: Gynecology;  Laterality: N/A;   COLONOSCOPY W/ POLYPECTOMY  06/21/2017   Dr.Perry   HYSTEROSCOPY WITH D & C N/A 08/27/2020   Procedure: DILATATION AND CURETTAGE /HYSTEROSCOPY;  Surgeon: Vanessa Kick, MD;  Location: [place];  Service: Gynecology;  Laterality: N/A;   LAPAROSCOPIC CHOLECYSTECTOMY  04/2006    LEG SURGERY Right 1983   femoral artery repair from cardiac cath injury    OPEN REDUCTION INTERNAL FIXATION (ORIF) METACARPAL Right 08/12/2017   Procedure: OPEN REDUCTION INTERNAL FIXATION (ORIF) 5th METACARPAL fracture;  Surgeon: Roseanne Kaufman, MD;  Location: [place];  Service: Orthopedics;  Laterality: Right;  60 mins   SPINAL FIXATION SURGERY  1973   age 57 - sclosis   TOTAL HIP ARTHROPLASTY Right 06-16-2010 _0    UPPER GASTROINTESTINAL ENDOSCOPY     VSD REPAIR  age 66   VULVECTOMY N/A 09/18/2015   Procedure: WIDE EXCISION VULVECTOMY;  Surgeon: Vanessa Kick, MD;  Location: [facility];  Service: Gynecology;  Laterality: N/A;    Allergies  No Known Allergies  EKGs/Labs/Other Studies Reviewed:   The following studies were reviewed today:  ZIO monitor 10/14/2020 Study Highlights   Persistent atrial fibrillation  (Burden 100%) V rates are frequently elevated with average HR of 102 bpm (range 62-185 bpm)   TTE 08/2020: IMPRESSIONS     1. Left ventricular ejection fraction, by estimation, is 60 to 65%. The  left ventricle has normal function. The left ventricle has no regional  wall motion abnormalities. Left ventricular diastolic function could not  be evaluated.   2. Right ventricular systolic function is normal. The right ventricular  size is normal. There is normal pulmonary artery systolic pressure. The  estimated right ventricular systolic pressure is 27.0 mmHg.   3. Left atrial size was mildly dilated.   4. A small pericardial effusion is present. The pericardial effusion is  posterior to the left ventricle. There is no evidence of cardiac  tamponade.   5. Hx of MV repair. Either an annuloplasty ring is present or mild MAC is  present. Unclear surgical details, but does not have typical appearance of  annuloplasty ring. The mitral valve has been repaired/replaced. Mild  mitral valve regurgitation. No  evidence of mitral stenosis.   6. The aortic valve is tricuspid. There  is mild calcification of the  aortic valve. There is mild thickening of the aortic valve. Aortic valve  regurgitation is not visualized. Mild aortic valve sclerosis is present,  with no evidence of aortic valve  stenosis.   7. The inferior vena cava is normal in size with greater than 50%  respiratory variability, suggesting right atrial pressure of 3 mmHg.   EKG:  EKG is not ordered today.   Recent Labs: 03/24/2022: NT-Pro BNP 1,025 05/04/2022: ALT 24; BUN 15; Creatinine, Ser 0.94; Potassium 3.8; Sodium 138 06/03/2022: Hemoglobin 14.3; Platelets 301.0  Recent Lipid Panel No results found for: "CHOL", "TRIG", "HDL", "CHOLHDL", "VLDL", "LDLCALC", "LDLDIRECT"  Risk Assessment/Calculations:   CHA2DS2-VASc Score = 1   This indicates a 0.6% annual risk of stroke. The patient's score is based upon: CHF History: 0 HTN History: 0 Diabetes History: 0 Stroke History: 0 Vascular Disease History: 0 Age Score: 0 Gender Score:  1      Home Medications   Current Meds  Medication Sig   acetaminophen (TYLENOL 8 HOUR ARTHRITIS PAIN) 650 MG CR tablet Take 650 mg by mouth every 8 (eight) hours as needed for pain.   Collagen-Vitamin C-Biotin (COLLAGEN 1500/C PO) Take 3 capsules by mouth daily.   diltiazem (CARDIZEM CD) 180 MG 24 hr capsule Take 1 capsule (180 mg total) by mouth daily.   estrogens-methylTEST 1.25-2.5 MG TABS tablet Take 1 tablet by mouth daily.   ferrous sulfate 325 (65 FE) MG EC tablet Take 1 tablet (325 mg total) by mouth 2 (two) times daily.   medroxyPROGESTERone (PROVERA) 5 MG tablet Take 5 mg by mouth daily.   OVER THE COUNTER MEDICATION Take 2 capsules by mouth daily. Zinc, vit C and Vit D   pantoprazole (PROTONIX) 40 MG tablet Take 1 tablet (40 mg total) by mouth daily.   traMADol (ULTRAM) 50 MG tablet Take 100 mg by mouth every morning.   Current Facility-Administered Medications for the 06/22/22 encounter (Office Visit) with Elgie Collard, PA-C  Medication   0.9 %   sodium chloride infusion     Review of Systems      All other systems reviewed and are otherwise negative except as noted above.  Physical Exam    VS:  BP 120/72   Pulse 62   Ht _0  (1.575 m)   Wt 198 lb 9.6 oz (90.1 kg)   SpO2 98%   BMI 36.32 kg/m  , BMI Body mass index is 36.32 kg/m.  Wt Readings from Last 3 Encounters:  06/22/22 198 lb 9.6 oz (90.1 kg)  05/04/22 192 lb 10.9 oz (87.4 kg)  04/02/22 192 lb (87.1 kg)     GEN: Well nourished, well developed, in no acute distress. HEENT: normal. Neck: Supple, no JVD, carotid bruits, or masses. Cardiac: irregularly irregular, no murmurs, rubs, or gallops. No clubbing, cyanosis, edema.  Radials/PT 2+ and equal bilaterally.  Respiratory:  Respirations regular and unlabored, clear to auscultation bilaterally. GI: Soft, nontender, nondistended. MS: No deformity or atrophy. Skin: Warm and dry, no rash. Neuro:  Strength and sensation are intact. Psych: Normal affect.  Assessment & Plan    Anemia -off anticoagulation (stroke risk < 1 %) -Most recent hemoglobin 14.3 status post transfusion -Remains on iron supplementation -symptoms are resolving  Shortness of breath -resolved  Atrial fibrillation -rate controlled and not on anticoagulation with recent GI bleed and anemia requiring transfusion -discussed watchman however, risk of stroke is low  Bilateral leg edema -resolved   Hyperlipidemia -LDL 131, goal < 100 -not currently on any medications -She will need repeat Lipid panel at next visit and possibly adding a statin to her meds        Disposition: Follow up 1 year  with Freada Bergeron, MD or APP.  Signed, Elgie Collard, PA-C 06/22/2022, 12:05 PM [place]

## 2022-06-22 ENCOUNTER — Encounter: Payer: Self-pay | Admitting: Physician Assistant

## 2022-06-22 ENCOUNTER — Ambulatory Visit: Payer: PRIVATE HEALTH INSURANCE | Attending: Physician Assistant | Admitting: Physician Assistant

## 2022-06-22 VITALS — BP 120/72 | HR 62 | Ht 62.0 in | Wt 198.6 lb

## 2022-06-22 DIAGNOSIS — I4891 Unspecified atrial fibrillation: Secondary | ICD-10-CM

## 2022-06-22 DIAGNOSIS — D649 Anemia, unspecified: Secondary | ICD-10-CM | POA: Diagnosis not present

## 2022-06-22 DIAGNOSIS — R0602 Shortness of breath: Secondary | ICD-10-CM | POA: Diagnosis not present

## 2022-06-22 DIAGNOSIS — E785 Hyperlipidemia, unspecified: Secondary | ICD-10-CM

## 2022-06-22 DIAGNOSIS — R600 Localized edema: Secondary | ICD-10-CM | POA: Diagnosis not present

## 2022-06-22 NOTE — Patient Instructions (Signed)
Medication Instructions:  Your physician recommends that you continue on your current medications as directed. Please refer to the Current Medication list given to you today.  *If you need a refill on your cardiac medications before your next appointment, please call your pharmacy*   Lab Work: None If you have labs (blood work) drawn today and your tests are completely normal, you will receive your results only by: [place] (if you have MyChart) OR A paper copy in the mail If you have any lab test that is abnormal or we need to change your treatment, we will call you to review the results.   Follow-Up: At [facility], you and your health needs are our priority.  As part of our continuing mission to provide you with exceptional heart care, we have created designated Provider Care Teams.  These Care Teams include your primary Cardiologist (physician) and Advanced Practice Providers (APPs -  Physician Assistants and Nurse Practitioners) who all work together to provide you with the care you need, when you need it.  Your next appointment:   9 month(s)  The format for your next appointment:   In Person  Provider:   Freada Bergeron, MD   Important Information About Sugar

## 2022-06-26 ENCOUNTER — Other Ambulatory Visit (HOSPITAL_COMMUNITY): Payer: Self-pay | Admitting: Obstetrics and Gynecology

## 2022-06-26 DIAGNOSIS — Z1231 Encounter for screening mammogram for malignant neoplasm of breast: Secondary | ICD-10-CM

## 2022-07-08 NOTE — Progress Notes (Unsigned)
[place], [place] [address]   CLINIC:  Medical Oncology/Hematology  PCP:  [facility], [place] [address] [phone]   REASON FOR VISIT:  Follow-up for iron deficiency anemia  CURRENT THERAPY: Oral iron supplementation with intermittent IV iron infusions  INTERVAL HISTORY:  [name] 61 y.o. female returns for routine follow-up of her iron deficiency anemia.  She was last seen by Tarri Abernethy PA-C/Dr. Delton Coombes on 05/04/2022.  She received IV Venofer 300 mg x 3 from 05/07/2022 through 05/21/2022.  She reports that she feels *** after IV iron. *** Fatigue? *** Palpitations and lightheadedness? She denies any ice pica, restless legs, headaches, chest pain, dyspnea on exertion, or syncope. *** Any recent melena or rectal bleeding? *** Still off of Eliquis?  Or restarted by cardiology? *** Continues to follow with gastroenterology (Dr. Henrene Pastor).  She has ***% energy and ***% appetite. She endorses that she is maintaining a stable weight.   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   History of vulvar dysplasia    VIN3   Iron deficiency anemia due to chronic blood loss 05/05/2022   Lumbar spondylosis    OA (osteoarthritis)    PMB (postmenopausal bleeding)     Scoliosis    Wears glasses    Past Surgical History:  Procedure Laterality Date   ANTERIOR CERVICAL DECOMP/DISCECTOMY FUSION  11-25-2009  '@[facility]'$    C4 --- C7   ANTERIOR LAT LUMBAR FUSION Left 10/22/2016   Procedure: Anterior Lateral Lumbar Fusion - Lumbar two - lumbar three, with Lateral plate;  Surgeon: Kary Kos, MD;  Location: [facility];  Service: Neurosurgery;  Laterality: Left;  Anterior Lateral Lumbar Fusion - Lumbar two - lumbar three, with Lateral plate   CARDIAC CATHETERIZATION  age 59   CO2 LASER APPLICATION N/A [id number]/64/6803   Procedure: CO2 LASER APPLICATION;  Surgeon: Gus Height, MD;  Location: [facility];  Service: Gynecology;  Laterality: N/A;   COLONOSCOPY W/ POLYPECTOMY  06/21/2017   Dr.Perry   HYSTEROSCOPY WITH D & C N/A 08/27/2020   Procedure: DILATATION AND CURETTAGE /HYSTEROSCOPY;  Surgeon: Vanessa Kick, MD;  Location: [facility];  Service: Gynecology;  Laterality: N/A;   LAPAROSCOPIC CHOLECYSTECTOMY  04/2006   LEG SURGERY Right 1983   femoral artery repair from cardiac cath injury    OPEN REDUCTION INTERNAL FIXATION (ORIF) METACARPAL Right 08/12/2017   Procedure: OPEN REDUCTION INTERNAL FIXATION (ORIF) 5th METACARPAL fracture;  Surgeon: Roseanne Kaufman, MD;  Location: [facility];  Service: Orthopedics;  Laterality: Right;  60 mins   SPINAL FIXATION SURGERY  1973   age 50 - sclosis   TOTAL HIP ARTHROPLASTY Right 06-16-2010 '@[facility]    UPPER GASTROINTESTINAL ENDOSCOPY     VSD REPAIR  age  [place] 09/18/2015   Procedure: WIDE EXCISION VULVECTOMY;  Surgeon: Vanessa Kick, MD;  Location: [facility];  Service: Gynecology;  Laterality: N/A;     SOCIAL HISTORY:  Social History   Socioeconomic History   Marital status: Married    Spouse name: Not on file   Number of children: Not on file   Years of education: Not on file   Highest education level: Not on file  Occupational History   Occupation: [employer]  Tobacco Use   Smoking status: Former    Packs/day: 1.00     Years: 20.00    Total pack years: 20.00    Types: Cigarettes    Quit date: 09/07/2002    Years since quitting: 19.8   Smokeless tobacco: Never  Vaping Use   Vaping Use: Never used  Substance and Sexual Activity   Alcohol use: Yes    Comment: occ   Drug use: Never   Sexual activity: Yes    Birth control/protection: Post-menopausal  Other Topics Concern   Not on file  Social History Narrative   Not on file   Social Determinants of Health   Financial Resource Strain: Not on file  Food Insecurity: Not on file  Transportation Needs: Not on file  Physical Activity: Not on file  Stress: Not on file  Social Connections: Not on file  Intimate Partner Violence: Not on file    FAMILY HISTORY:  Family History  Problem Relation Age of Onset   COPD Mother    Diabetes Father    Pneumonia Father    Alcoholism Father    Diabetes Other    Heart disease Other    Colon cancer Neg Hx    Esophageal cancer Neg Hx    Stomach cancer Neg Hx     CURRENT MEDICATIONS:  Outpatient Encounter Medications as of 07/09/2022  Medication Sig   acetaminophen (TYLENOL 8 HOUR ARTHRITIS PAIN) 650 MG CR tablet Take 650 mg by mouth every 8 (eight) hours as needed for pain.   Collagen-Vitamin C-Biotin (COLLAGEN 1500/C PO) Take 3 capsules by mouth daily.   diltiazem (CARDIZEM CD) 180 MG 24 hr capsule Take 1 capsule (180 mg total) by mouth daily.   estrogens-methylTEST 1.25-2.5 MG TABS tablet Take 1 tablet by mouth daily.   ferrous sulfate 325 (65 FE) MG EC tablet Take 1 tablet (325 mg total) by mouth 2 (two) times daily.   medroxyPROGESTERone (PROVERA) 5 MG tablet Take 5 mg by mouth daily.   OVER THE COUNTER MEDICATION Take 2 capsules by mouth daily. Zinc, vit C and Vit D   pantoprazole (PROTONIX) 40 MG tablet Take 1 tablet (40 mg total) by mouth daily.   traMADol (ULTRAM) 50 MG tablet Take 100 mg by mouth every morning.   Facility-Administered Encounter Medications as of 07/09/2022  Medication   0.9 %   sodium chloride infusion    ALLERGIES:  No Known Allergies   PHYSICAL EXAM:*** ECOG PERFORMANCE STATUS: {CHL ONC ECOG PS:[id number]}  There were no vitals filed for this visit. There were no vitals filed for this visit. Physical Exam   LABORATORY DATA:  I have reviewed the labs as listed.  CBC    Component Value Date/Time   WBC 10.6 (H) 06/03/2022 1004   RBC 4.94 06/03/2022 1004   HGB 14.3 06/03/2022 1004   HCT 44.1 06/03/2022 1004   PLT 301.0 06/03/2022 1004   MCV 89.2 06/03/2022 1004   MCH 27.3 05/04/2022 1407   MCHC 32.4  06/03/2022 1004   RDW 21.4 (H) 06/03/2022 1004   LYMPHSABS 1.6 06/03/2022 1004   MONOABS 0.7 06/03/2022 1004   EOSABS 0.0 06/03/2022 1004   BASOSABS 0.1 06/03/2022 1004      Latest Ref Rng & Units 05/04/2022    2:07 PM 03/24/2022   11:10 AM 10/02/2020   11:30 AM  CMP  Glucose 70 - 99 mg/dL 87  93  83   BUN 8 - 23 mg/dL '15  19  17   '$ Creatinine 0.44 - 1.00 mg/dL 0.94  0.75  0.77   Sodium 135 - 145 mmol/L 138  139  137   Potassium 3.5 - 5.1 mmol/L 3.8  4.5  4.4   Chloride 98 - 111 mmol/L 105  105  103   CO2 22 - 32 mmol/L '23  23  24   '$ Calcium 8.9 - 10.3 mg/dL 8.7  9.1  9.2   Total Protein 6.5 - 8.1 g/dL 7.0  6.0    Total Bilirubin 0.3 - 1.2 mg/dL 0.8  0.2    Alkaline Phos 38 - 126 U/L 59  55    AST 15 - 41 U/L 22  16    ALT 0 - 44 U/L 24  10      DIAGNOSTIC IMAGING:  I have independently reviewed the relevant imaging and discussed with the patient.  ASSESSMENT & PLAN: 1.  Iron deficiency anemia - Seen at the request of Dr. Gerarda Fraction - Hospitalized in [place] from 03/28/2022 to 03/29/2022 for suspected upper GI bleed: Hgb 5.1, stool guaiac positive, symptomatic with fatigue, dyspnea, and syncope.  Received 2 units PRBC.  Hgb at discharge 7.1 with ferritin 4.9 and serum iron 6. - Patient was on Eliquis at the time of hospitalization (had been on anticoagulation x1 week in the setting of planned electrocardioversion for symptomatic atrial  fibrillation).  Eliquis held at hospital discharge. - EGD (04/02/2022) by Dr. Henrene Pastor significant for moderate-sized sliding hiatal hernia, no cause for iron deficiency anemia was found. 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 - Colonoscopy (08/07/2021): Multiple diverticula but otherwise normal colon - Anemia panel (05/04/2022): Reticulocytes 2.4%.  Normal LDH. Ferritin 39, iron saturation 24%, TIBC 411 Normal folate, copper, MMA, B12 Normal kidney function on CMP. SPEP negative for M spike.  Immunofixation normal.  Normal light chains. - Has been taking oral iron supplements since 03/29/2022.*** - She received IV Venofer 300 mg x 3 from 05/07/2022 through 05/21/2022 - She remains fatigued with lightheadedness*** - Stool remains dark, but she wonders if this is due to iron supplements.  *** - Labs today *** - PLAN: IV iron?  *** - Continue daily iron supplementation - Repeat labs and RTC in *** - Patient is aware of symptoms that would prompt immediate medical attention   2.  Other history - PMH:  Atrial fibrillation, osteoarthritis, obesity.  Open heart surgery at age 64 due to ventricular septal defect. 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  Former smoker (quit 2004, 25-pack-year history).  Occasional social drinker.  No illicit drug use. - FAMILY: No family history of cancer, blood disorders, or GI issues.     PLAN SUMMARY & DISPOSITION: ***   All questions were answered. The patient knows to call the clinic with any problems, questions or concerns.  Medical decision making: ***  Time spent on visit: I spent ***  minutes counseling the patient face to face. The total time spent in the appointment was *** minutes and more than 50% was on counseling.   Harriett Rush, PA-C  ***

## 2022-07-09 ENCOUNTER — Inpatient Hospital Stay: Payer: PRIVATE HEALTH INSURANCE | Attending: Physician Assistant

## 2022-07-09 ENCOUNTER — Inpatient Hospital Stay (HOSPITAL_BASED_OUTPATIENT_CLINIC_OR_DEPARTMENT_OTHER): Payer: PRIVATE HEALTH INSURANCE | Admitting: Physician Assistant

## 2022-07-09 DIAGNOSIS — D500 Iron deficiency anemia secondary to blood loss (chronic): Secondary | ICD-10-CM | POA: Insufficient documentation

## 2022-07-09 DIAGNOSIS — D649 Anemia, unspecified: Secondary | ICD-10-CM

## 2022-07-09 DIAGNOSIS — K921 Melena: Secondary | ICD-10-CM | POA: Insufficient documentation

## 2022-07-09 DIAGNOSIS — K922 Gastrointestinal hemorrhage, unspecified: Secondary | ICD-10-CM | POA: Diagnosis not present

## 2022-07-09 LAB — CBC WITH DIFFERENTIAL/PLATELET
Abs Immature Granulocytes: 0.03 10*3/uL (ref 0.00–0.07)
Basophils Absolute: 0 10*3/uL (ref 0.0–0.1)
Basophils Relative: 1 %
Eosinophils Absolute: 0.1 10*3/uL (ref 0.0–0.5)
Eosinophils Relative: 1 %
HCT: 47 % — ABNORMAL HIGH (ref 36.0–46.0)
Hemoglobin: 15.4 g/dL — ABNORMAL HIGH (ref 12.0–15.0)
Immature Granulocytes: 0 %
Lymphocytes Relative: 22 %
Lymphs Abs: 1.6 10*3/uL (ref 0.7–4.0)
MCH: 30 pg (ref 26.0–34.0)
MCHC: 32.8 g/dL (ref 30.0–36.0)
MCV: 91.4 fL (ref 80.0–100.0)
Monocytes Absolute: 0.5 10*3/uL (ref 0.1–1.0)
Monocytes Relative: 7 %
Neutro Abs: 5.1 10*3/uL (ref 1.7–7.7)
Neutrophils Relative %: 69 %
Platelets: 288 10*3/uL (ref 150–400)
RBC: 5.14 MIL/uL — ABNORMAL HIGH (ref 3.87–5.11)
RDW: 15.9 % — ABNORMAL HIGH (ref 11.5–15.5)
WBC: 7.3 10*3/uL (ref 4.0–10.5)
nRBC: 0 % (ref 0.0–0.2)

## 2022-07-09 LAB — IRON AND TIBC
Iron: 102 ug/dL (ref 28–170)
Saturation Ratios: 29 % (ref 10.4–31.8)
TIBC: 355 ug/dL (ref 250–450)
UIBC: 253 ug/dL

## 2022-07-09 LAB — FERRITIN: Ferritin: 120 ng/mL (ref 11–307)

## 2022-07-09 NOTE — Progress Notes (Signed)
Virtual Visit via Telephone Note [facility]  I connected with [name]  on 07/09/22 at 4:05 PM by telephone and verified that I am speaking with the correct person using two identifiers.  Location: Patient: Home  Provider: [facility]   I discussed the limitations, risks, security and privacy concerns of performing an evaluation and management service by telephone and the availability of in person appointments. I also discussed with the patient that there may be a patient responsible charge related to this service. The patient expressed understanding and agreed to proceed.  REASON FOR VISIT:  Follow-up for iron deficiency anemia  CURRENT THERAPY: Oral iron supplementation with intermittent IV iron infusions  INTERVAL HISTORY:  Ms. [name] 61 y.o. female returns for routine follow-up of her iron deficiency anemia.  She was last seen by Tarri Abernethy PA-C/Dr. Delton Coombes on 05/04/2022.  She received IV Venofer 300 mg x 3 from 05/07/2022 through 05/21/2022.  She reports that she feels significantly improved after IV iron.  She denies any current fatigue, palpitations, or lightheadedness.  No ice pica, restless legs, headaches, chest pain, dyspnea on exertion, or syncope.  She continues to have dark stool from iron supplements but denies any melena or rectal bleeding.  Continues to follow with gastroenterology (Dr. Henrene Pastor).  She has 100% energy and 100% appetite. She endorses that she is maintaining a stable weight.    OBSERVATIONS/OBJECTIVE: Review of Systems  Constitutional:  Negative for chills, diaphoresis, fever, malaise/fatigue and weight loss.  Respiratory:  Negative for cough and shortness of breath.   Cardiovascular:  Negative for chest pain and palpitations.  Gastrointestinal:  Positive for constipation. Negative for abdominal pain, blood in stool, melena, nausea and vomiting.  Musculoskeletal:  Positive for back pain.  Neurological:   Positive for tremors. Negative for dizziness and headaches.    PHYSICAL EXAM (per limitations of virtual telephone visit): The patient is alert and oriented x 3, exhibiting adequate mentation, good mood, and ability to speak in full sentences and execute sound judgement.   ASSESSMENT & PLAN: 1.  Iron deficiency anemia - Seen at the request of Dr. Gerarda Fraction - Hospitalized in [place] from 03/28/2022 to 03/29/2022 for suspected upper GI bleed: Hgb 5.1, stool guaiac positive, symptomatic with fatigue, dyspnea, and syncope.  Received 2 units PRBC.  Hgb at discharge 7.1 with ferritin 4.9 and serum iron 6. - Patient was on Eliquis at the time of hospitalization (had been on anticoagulation x1 week in the setting of planned electrocardioversion for symptomatic atrial fibrillation).  Eliquis held at hospital discharge. - EGD (04/02/2022) by Dr. Henrene Pastor significant for moderate-sized sliding hiatal hernia, no cause for iron deficiency anemia was found. 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 - Colonoscopy (08/07/2021): Multiple diverticula but otherwise normal colon - Anemia panel (05/04/2022): Reticulocytes 2.4%.  Normal LDH. Ferritin 39, iron saturation 24%, TIBC 411 Normal folate, copper, MMA, B12 Normal kidney function on CMP. SPEP negative for M spike.  Immunofixation normal.  Normal light chains. - Has been taking oral iron supplements since 03/29/2022. - She received IV Venofer 300 mg x 3 from 05/07/2022 through 05/21/2022 - Stool remains dark while taking iron supplements - Labs today (07/09/2022): Hgb 15.4/MCV 91.4, ferritin 120, iron saturation 29% - PLAN: No indication for IV iron at this time.  Continue iron supplementation, but can decrease to every other day - Note  made of mild erythrocytosis, will consider additional work-up if persistent at next visit. - Repeat labs in 4  months with phone visit to follow - Patient is aware of symptoms that would prompt immediate medical attention   2.  Other history - PMH:  Atrial fibrillation, osteoarthritis, obesity.  Open heart surgery at age 54 due to ventricular septal defect. 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  Former smoker (quit 2004, 25-pack-year history).  Occasional social drinker.  No illicit drug use. - FAMILY: No family history of cancer, blood disorders, or GI issues.  FOLLOW UP INSTRUCTIONS: Labs in 4 months PHONE visit after labs    I discussed the assessment and treatment plan with the patient. The patient was provided an opportunity to ask questions and all were answered. The patient agreed with the plan and demonstrated an understanding of the instructions.   The patient was advised to call back or seek an in-person evaluation if the symptoms worsen or if the condition fails to improve as anticipated.  I provided 17 minutes of non-face-to-face time during this encounter.   Harriett Rush, PA-C 07/09/22 10:06 PM

## 2022-07-13 ENCOUNTER — Ambulatory Visit (HOSPITAL_COMMUNITY)
Admission: RE | Admit: 2022-07-13 | Discharge: 2022-07-13 | Disposition: A | Discharge status: Home / Self Care | Payer: PRIVATE HEALTH INSURANCE | Source: Ambulatory Visit | Attending: Obstetrics and Gynecology | Admitting: Obstetrics and Gynecology

## 2022-07-13 DIAGNOSIS — Z1231 Encounter for screening mammogram for malignant neoplasm of breast: Secondary | ICD-10-CM | POA: Insufficient documentation

## 2022-07-27 ENCOUNTER — Other Ambulatory Visit: Payer: Self-pay

## 2022-07-27 ENCOUNTER — Inpatient Hospital Stay (HOSPITAL_COMMUNITY)
Admission: EM | Admit: 2022-07-27 | Discharge: 2022-07-29 | DRG: 377 | Disposition: A | Discharge status: Home / Self Care | Payer: PRIVATE HEALTH INSURANCE | Attending: Family Medicine | Admitting: Family Medicine

## 2022-07-27 ENCOUNTER — Encounter (HOSPITAL_COMMUNITY): Payer: Self-pay

## 2022-07-27 ENCOUNTER — Telehealth: Payer: Self-pay | Admitting: Gastroenterology

## 2022-07-27 DIAGNOSIS — L818 Other specified disorders of pigmentation: Secondary | ICD-10-CM | POA: Diagnosis not present

## 2022-07-27 DIAGNOSIS — R0902 Hypoxemia: Secondary | ICD-10-CM | POA: Diagnosis not present

## 2022-07-27 DIAGNOSIS — Z86002 Personal history of in-situ neoplasm of other and unspecified genital organs: Secondary | ICD-10-CM

## 2022-07-27 DIAGNOSIS — Z825 Family history of asthma and other chronic lower respiratory diseases: Secondary | ICD-10-CM

## 2022-07-27 DIAGNOSIS — I4891 Unspecified atrial fibrillation: Secondary | ICD-10-CM | POA: Diagnosis not present

## 2022-07-27 DIAGNOSIS — K317 Polyp of stomach and duodenum: Secondary | ICD-10-CM | POA: Diagnosis present

## 2022-07-27 DIAGNOSIS — Z9049 Acquired absence of other specified parts of digestive tract: Secondary | ICD-10-CM | POA: Diagnosis not present

## 2022-07-27 DIAGNOSIS — Z811 Family history of alcohol abuse and dependence: Secondary | ICD-10-CM | POA: Diagnosis not present

## 2022-07-27 DIAGNOSIS — I480 Paroxysmal atrial fibrillation: Secondary | ICD-10-CM | POA: Diagnosis present

## 2022-07-27 DIAGNOSIS — K920 Hematemesis: Secondary | ICD-10-CM | POA: Diagnosis present

## 2022-07-27 DIAGNOSIS — E66812 Obesity, class 2: Secondary | ICD-10-CM | POA: Diagnosis present

## 2022-07-27 DIAGNOSIS — K5909 Other constipation: Secondary | ICD-10-CM | POA: Diagnosis present

## 2022-07-27 DIAGNOSIS — K922 Gastrointestinal hemorrhage, unspecified: Secondary | ICD-10-CM | POA: Diagnosis present

## 2022-07-27 DIAGNOSIS — K921 Melena: Secondary | ICD-10-CM

## 2022-07-27 DIAGNOSIS — K449 Diaphragmatic hernia without obstruction or gangrene: Secondary | ICD-10-CM | POA: Diagnosis present

## 2022-07-27 DIAGNOSIS — K31811 Angiodysplasia of stomach and duodenum with bleeding: Principal | ICD-10-CM | POA: Diagnosis present

## 2022-07-27 DIAGNOSIS — M419 Scoliosis, unspecified: Secondary | ICD-10-CM | POA: Diagnosis present

## 2022-07-27 DIAGNOSIS — K219 Gastro-esophageal reflux disease without esophagitis: Secondary | ICD-10-CM | POA: Diagnosis present

## 2022-07-27 DIAGNOSIS — R195 Other fecal abnormalities: Secondary | ICD-10-CM | POA: Diagnosis not present

## 2022-07-27 DIAGNOSIS — D509 Iron deficiency anemia, unspecified: Secondary | ICD-10-CM | POA: Diagnosis present

## 2022-07-27 DIAGNOSIS — Z96641 Presence of right artificial hip joint: Secondary | ICD-10-CM | POA: Diagnosis present

## 2022-07-27 DIAGNOSIS — K222 Esophageal obstruction: Secondary | ICD-10-CM | POA: Diagnosis present

## 2022-07-27 DIAGNOSIS — Z87891 Personal history of nicotine dependence: Secondary | ICD-10-CM

## 2022-07-27 DIAGNOSIS — Z981 Arthrodesis status: Secondary | ICD-10-CM | POA: Diagnosis not present

## 2022-07-27 DIAGNOSIS — Z6836 Body mass index (BMI) 36.0-36.9, adult: Secondary | ICD-10-CM | POA: Diagnosis not present

## 2022-07-27 DIAGNOSIS — I482 Chronic atrial fibrillation, unspecified: Secondary | ICD-10-CM | POA: Diagnosis present

## 2022-07-27 DIAGNOSIS — E669 Obesity, unspecified: Secondary | ICD-10-CM | POA: Diagnosis present

## 2022-07-27 DIAGNOSIS — Z87412 Personal history of vulvar dysplasia: Secondary | ICD-10-CM | POA: Diagnosis not present

## 2022-07-27 DIAGNOSIS — Z833 Family history of diabetes mellitus: Secondary | ICD-10-CM

## 2022-07-27 DIAGNOSIS — M5440 Lumbago with sciatica, unspecified side: Secondary | ICD-10-CM | POA: Diagnosis not present

## 2022-07-27 DIAGNOSIS — M48061 Spinal stenosis, lumbar region without neurogenic claudication: Secondary | ICD-10-CM | POA: Diagnosis not present

## 2022-07-27 DIAGNOSIS — D62 Acute posthemorrhagic anemia: Secondary | ICD-10-CM | POA: Diagnosis not present

## 2022-07-27 DIAGNOSIS — J690 Pneumonitis due to inhalation of food and vomit: Secondary | ICD-10-CM | POA: Diagnosis not present

## 2022-07-27 DIAGNOSIS — M1990 Unspecified osteoarthritis, unspecified site: Secondary | ICD-10-CM | POA: Diagnosis not present

## 2022-07-27 DIAGNOSIS — Z8719 Personal history of other diseases of the digestive system: Secondary | ICD-10-CM

## 2022-07-27 DIAGNOSIS — Z79899 Other long term (current) drug therapy: Secondary | ICD-10-CM

## 2022-07-27 DIAGNOSIS — D649 Anemia, unspecified: Secondary | ICD-10-CM | POA: Diagnosis not present

## 2022-07-27 DIAGNOSIS — M1991 Primary osteoarthritis, unspecified site: Secondary | ICD-10-CM | POA: Diagnosis not present

## 2022-07-27 DIAGNOSIS — K5731 Diverticulosis of large intestine without perforation or abscess with bleeding: Secondary | ICD-10-CM | POA: Diagnosis not present

## 2022-07-27 DIAGNOSIS — Z8774 Personal history of (corrected) congenital malformations of heart and circulatory system: Secondary | ICD-10-CM

## 2022-07-27 DIAGNOSIS — K5520 Angiodysplasia of colon without hemorrhage: Secondary | ICD-10-CM

## 2022-07-27 DIAGNOSIS — K31819 Angiodysplasia of stomach and duodenum without bleeding: Secondary | ICD-10-CM | POA: Diagnosis not present

## 2022-07-27 DIAGNOSIS — R55 Syncope and collapse: Secondary | ICD-10-CM | POA: Diagnosis not present

## 2022-07-27 LAB — CBC WITH DIFFERENTIAL/PLATELET
Abs Immature Granulocytes: 0.05 10*3/uL (ref 0.00–0.07)
Basophils Absolute: 0.1 10*3/uL (ref 0.0–0.1)
Basophils Relative: 1 %
Eosinophils Absolute: 0.1 10*3/uL (ref 0.0–0.5)
Eosinophils Relative: 1 %
HCT: 39.9 % (ref 36.0–46.0)
Hemoglobin: 12.8 g/dL (ref 12.0–15.0)
Immature Granulocytes: 1 %
Lymphocytes Relative: 15 %
Lymphs Abs: 1.7 10*3/uL (ref 0.7–4.0)
MCH: 30.3 pg (ref 26.0–34.0)
MCHC: 32.1 g/dL (ref 30.0–36.0)
MCV: 94.5 fL (ref 80.0–100.0)
Monocytes Absolute: 0.7 10*3/uL (ref 0.1–1.0)
Monocytes Relative: 7 %
Neutro Abs: 8.4 10*3/uL — ABNORMAL HIGH (ref 1.7–7.7)
Neutrophils Relative %: 75 %
Platelets: 303 10*3/uL (ref 150–400)
RBC: 4.22 MIL/uL (ref 3.87–5.11)
RDW: 15.1 % (ref 11.5–15.5)
WBC: 11 10*3/uL — ABNORMAL HIGH (ref 4.0–10.5)
nRBC: 0 % (ref 0.0–0.2)

## 2022-07-27 LAB — COMPREHENSIVE METABOLIC PANEL
ALT: 35 U/L (ref 0–44)
AST: 22 U/L (ref 15–41)
Albumin: 3.4 g/dL — ABNORMAL LOW (ref 3.5–5.0)
Alkaline Phosphatase: 49 U/L (ref 38–126)
Anion gap: 6 (ref 5–15)
BUN: 19 mg/dL (ref 8–23)
CO2: 28 mmol/L (ref 22–32)
Calcium: 8.8 mg/dL — ABNORMAL LOW (ref 8.9–10.3)
Chloride: 108 mmol/L (ref 98–111)
Creatinine, Ser: 0.68 mg/dL (ref 0.44–1.00)
GFR, Estimated: >60 mL/min (ref >60)
Glucose, Bld: 114 mg/dL — ABNORMAL HIGH (ref 70–99)
Potassium: 4 mmol/L (ref 3.5–5.1)
Sodium: 142 mmol/L (ref 135–145)
Total Bilirubin: 0.6 mg/dL (ref 0.3–1.2)
Total Protein: 6.4 g/dL — ABNORMAL LOW (ref 6.5–8.1)

## 2022-07-27 LAB — HEMOGLOBIN AND HEMATOCRIT, BLOOD
HCT: 34.4 % — ABNORMAL LOW (ref 36.0–46.0)
Hemoglobin: 11.2 g/dL — ABNORMAL LOW (ref 12.0–15.0)

## 2022-07-27 LAB — TYPE AND SCREEN
ABO/RH(D): AB POS
Antibody Screen: NEGATIVE

## 2022-07-27 LAB — PROTIME-INR
INR: 1.1 (ref 0.8–1.2)
Prothrombin Time: 13.7 seconds (ref 11.4–15.2)

## 2022-07-27 MED ORDER — TRAMADOL HCL 50 MG PO TABS
100.0000 mg | ORAL_TABLET | Freq: Two times a day (BID) | ORAL | Status: DC
  Administered 2022-07-27 – 2022-07-29 (×4): 100 mg via ORAL
  Filled 2022-07-27 (×4): qty 2

## 2022-07-27 MED ORDER — LACTATED RINGERS IV SOLN: INTRAVENOUS | Status: DC

## 2022-07-27 MED ORDER — ACETAMINOPHEN 325 MG PO TABS
650.0000 mg | ORAL_TABLET | Freq: Four times a day (QID) | ORAL | Status: DC | PRN
  Administered 2022-07-28: 650 mg via ORAL
  Filled 2022-07-27: qty 2

## 2022-07-27 MED ORDER — HYDROCODONE-ACETAMINOPHEN 7.5-325 MG PO TABS
1.0000 | ORAL_TABLET | Freq: Every day | ORAL | Status: DC | PRN
  Filled 2022-07-27: qty 1

## 2022-07-27 MED ORDER — ONDANSETRON HCL 4 MG PO TABS: 4.0000 mg | ORAL_TABLET | Freq: Four times a day (QID) | ORAL | Status: DC | PRN

## 2022-07-27 MED ORDER — PANTOPRAZOLE SODIUM 40 MG IV SOLR: 40.0000 mg | Freq: Two times a day (BID) | INTRAVENOUS | Status: DC

## 2022-07-27 MED ORDER — ACETAMINOPHEN 650 MG RE SUPP: 650.0000 mg | Freq: Four times a day (QID) | RECTAL | Status: DC | PRN

## 2022-07-27 MED ORDER — PANTOPRAZOLE 80MG IVPB - SIMPLE MED
80.0000 mg | Freq: Once | INTRAVENOUS | Status: AC
  Administered 2022-07-27: 80 mg via INTRAVENOUS
  Filled 2022-07-27: qty 80

## 2022-07-27 MED ORDER — PANTOPRAZOLE SODIUM 40 MG IV SOLR
40.0000 mg | Freq: Once | INTRAVENOUS | Status: DC
  Filled 2022-07-27: qty 10

## 2022-07-27 MED ORDER — ONDANSETRON HCL 4 MG/2ML IJ SOLN
4.0000 mg | Freq: Once | INTRAMUSCULAR | Status: AC
  Administered 2022-07-27: 4 mg via INTRAVENOUS
  Filled 2022-07-27: qty 2

## 2022-07-27 MED ORDER — DOCUSATE SODIUM 100 MG PO CAPS
200.0000 mg | ORAL_CAPSULE | Freq: Every day | ORAL | Status: DC
  Administered 2022-07-27 – 2022-07-28 (×2): 200 mg via ORAL
  Filled 2022-07-27 (×2): qty 2

## 2022-07-27 MED ORDER — PANTOPRAZOLE INFUSION (NEW) - SIMPLE MED
8.0000 mg/h | INTRAVENOUS | Status: DC
  Administered 2022-07-27 – 2022-07-29 (×5): 8 mg/h via INTRAVENOUS
  Filled 2022-07-27: qty 80
  Filled 2022-07-27: qty 100
  Filled 2022-07-27 (×4): qty 80

## 2022-07-27 MED ORDER — DILTIAZEM HCL ER COATED BEADS 180 MG PO CP24
180.0000 mg | ORAL_CAPSULE | Freq: Every day | ORAL | Status: DC
  Administered 2022-07-27 – 2022-07-29 (×3): 180 mg via ORAL
  Filled 2022-07-27 (×4): qty 1

## 2022-07-27 MED ORDER — ONDANSETRON HCL 4 MG/2ML IJ SOLN: 4.0000 mg | Freq: Four times a day (QID) | INTRAMUSCULAR | Status: DC | PRN

## 2022-07-27 NOTE — H&P (View-Only) (Signed)
Consultation Note   Referring Provider:  [facility] hospitalist PCP: Redmond School, MD Primary Gastroenterologist: Scarlette Shorts, MD   Reason for consultation: hematemesis  Hospital Day: 1  Assessment    #61 year old female with hemodynamically stable upper GI bleed ( bloody emesis x 4 ).   BUN is normal.  She is hemodynamically stable.  DDx include PUD, esophagitis, Mallory Weiss tear, Dieulafoy's lesion. She has a moderate size hiatal hernia but no Cameron's lesion on EGD in July.   # Acute on chronic anemia secondary to above. Hgb 12.8, down from 15.4 on 11/2.  Followed by Hematology for IDA. Takes daily oral iron  #Chronic constipation exacerbated by oral iron  # History of multiple colon polyps, surveillance colonoscopy due December 2027  #Chronic atrial fibrillation, not anticoagulated.    # See PMH for additional medical problems   Plan  Clear liquids okay, NPO after MN Anti-emetics as needed Continue PPI infusion. Can adjust dose if needed after EGD Trend hgb, transfuse if hgb < 7 Schedule for EGD. The risks and benefits of EGD with possible biopsies were discussed with the patient who agrees to proceed.  Two colace at bedtime, trial of daily Benefiber as outpatient   HPI   [name] is a 61 y.o. female with a past medical history significant for GERD, esophageal stricture, moderate size hiatal hernia  colon polyps, iron deficiency anemia , small bowel AVMs , diverticulosis , chronic atrial fibrillation ( not anticoagulated).  See PMH for any additional medical problems.  Patient  was evaluated in our office 04/01/22, please refer to that office note for details.  In summary she had been hospitalized at [facility] in [place] with syncope and fatigue.  She was found to have significant iron deficiency anemia with a hemoglobin of 5.1.  She was transfused 2 units of blood.  Postinfusion hemoglobin was 7.1.  We  scheduled her for an EGD which didn't show any source for recent bleeding ( ( Please see full report below). She then underwent a VCE on 04/21/22 with findings of a probable AVM at the 30-minute mark just beyond the first duodenal image (( Please see full report below).  She is followed by hematology for anemia and currently taking daily iron which makes her stools dark.     Interval History :  [name] went to bed feeling okay last night.  She woke up early this morning, took a shower and subsequently felt nauseated She had a small amount of dark emesis.  This was then followed by 4 episodes of bright red emesis with clots.  No preceding abdominal pain though she does have some upper abdominal discomfort since vomiting.  She is not having any melena or hematochezia.  It has been several hours since her last episode of vomiting (she has not vomited in the ED). She doesn't take any NSAIDs.  She is not anticoagulated.   [name] gives a history of chronic constipation which has been worse since starting iron. She doesn't really like the taste of Miralax and hasn't really tried anything else.   [name] has has no GERD symptoms but tells me she that she has been told she has acid reflux.  She takes pantoprazole once a day.  She takes diltiazem for atrial fibrillation.   ED Vital signs are stable Significant studies BUN normal at 19 WBC 11 hemoglobin 12.8  Previous GI Evaluation     04/02/22 EGD Benign esophageal stricture 2. Moderate hiatal hernia without Cameron erosions 3. Incidental small benign fundic gland polyps 4. Otherwise normal EGD. Status post duodenal biopsies 5. No cause for iron deficiency anemia found. Rule out more distal small bowel lesion.  04/21/2022 video capsule study -Complete capsule study with adequate prep -Probable AVM at 30-minute mark which is 1 minute beyond first duodenal image  -Streak of blood from 33-minute mark through the 35-minute mark without visible lesion  Recent  Labs and Imaging MM 3D SCREEN BREAST BILATERAL  Result Date: 07/15/2022 CLINICAL DATA:  Screening. EXAM: DIGITAL SCREENING BILATERAL MAMMOGRAM WITH TOMOSYNTHESIS AND CAD TECHNIQUE: Bilateral screening digital craniocaudal and mediolateral oblique mammograms were obtained. Bilateral screening digital breast tomosynthesis was performed. The images were evaluated with computer-aided detection. COMPARISON:  Previous exam(s). ACR Breast Density Category b: There are scattered areas of fibroglandular density. FINDINGS: There are no findings suspicious for malignancy. IMPRESSION: No mammographic evidence of malignancy. A result letter of this screening mammogram will be mailed directly to the patient. RECOMMENDATION: Screening mammogram in one year. (Code:SM-B-01Y) BI-RADS CATEGORY  1: Negative. Electronically Signed   By: Valentino Saxon M.D.   On: 07/15/2022 08:22    Labs:  Recent Labs    07/27/22 0831  WBC 11.0*  HGB 12.8  HCT 39.9  PLT 303   Recent Labs    07/27/22 0831  NA 142  K 4.0  CL 108  CO2 28  GLUCOSE 114*  BUN 19  CREATININE 0.68  CALCIUM 8.8*   Recent Labs    07/27/22 0831  PROT 6.4*  ALBUMIN 3.4*  AST 22  ALT 35  ALKPHOS 49  BILITOT 0.6   No results for input(s): "HEPBSAG", "HCVAB", "HEPAIGM", "HEPBIGM" in the last 72 hours. No results for input(s): "LABPROT", "INR" in the last 72 hours.  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   History of vulvar dysplasia    VIN3   Iron deficiency anemia due to chronic blood loss 05/05/2022   Lumbar spondylosis    OA (osteoarthritis)    PMB (postmenopausal bleeding)    Scoliosis    Wears glasses     Past Surgical History:  Procedure Laterality Date   ANTERIOR CERVICAL DECOMP/DISCECTOMY FUSION  11-25-2009  '@[facility]'$    C4 --- C7   ANTERIOR LAT LUMBAR FUSION Left 10/22/2016   Procedure: Anterior Lateral Lumbar Fusion - Lumbar two - lumbar three, with Lateral plate;  Surgeon: Kary Kos, MD;  Location: [facility];  Service: Neurosurgery;  Laterality: Left;  Anterior Lateral Lumbar Fusion - Lumbar two - lumbar three, with Lateral plate   CARDIAC CATHETERIZATION  age 45   CO2 LASER APPLICATION N/A  07/03/2014   Procedure: CO2 LASER APPLICATION;  Surgeon: Gus Height, MD;  Location: [facility];  Service: Gynecology;  Laterality: N/A;   COLONOSCOPY W/ POLYPECTOMY  06/21/2017   Dr.Perry   HYSTEROSCOPY WITH D & C N/A 08/27/2020   Procedure: DILATATION AND CURETTAGE /HYSTEROSCOPY;  Surgeon: Vanessa Kick, MD;  Location: [facility];  Service: Gynecology;  Laterality: N/A;   LAPAROSCOPIC CHOLECYSTECTOMY  04/2006   LEG SURGERY Right 1983   femoral artery repair from cardiac cath injury    OPEN REDUCTION INTERNAL FIXATION (ORIF) METACARPAL Right 08/12/2017   Procedure: OPEN REDUCTION INTERNAL FIXATION (ORIF) 5th METACARPAL fracture;  Surgeon: Roseanne Kaufman, MD;  Location: [facility];  Service: Orthopedics;  Laterality: Right;  60 mins   SPINAL FIXATION SURGERY  1973   age 11 - sclosis   TOTAL HIP ARTHROPLASTY Right 06-16-2010 '@[facility]'$    UPPER GASTROINTESTINAL ENDOSCOPY     VSD REPAIR  age 64   VULVECTOMY N/A 09/18/2015   Procedure: WIDE EXCISION VULVECTOMY;  Surgeon: Vanessa Kick, MD;  Location: [facility];  Service: Gynecology;  Laterality: N/A;    Family History  Problem Relation Age of Onset   COPD Mother    Diabetes Father    Pneumonia Father    Alcoholism Father     Diabetes Other    Heart disease Other    Colon cancer Neg Hx    Esophageal cancer Neg Hx    Stomach cancer Neg Hx     Prior to Admission medications   Medication Sig Start Date End Date Taking? Authorizing Provider  Collagen-Vitamin C-Biotin (COLLAGEN 1500/C PO) Take 3 capsules by mouth daily.    [provider]  diltiazem (CARDIZEM CD) 180 MG 24 hr capsule Take 1 capsule (180 mg total) by mouth daily. 09/09/21   Imogene Burn, PA-C  estrogens-methylTEST 1.25-2.5 MG TABS tablet Take 1 tablet by mouth daily.    [provider]  ferrous sulfate 325 (65 FE) MG EC tablet Take 1 tablet (325 mg total) by mouth 2 (two) times daily. 05/04/22   [name], MD  HYDROcodone-acetaminophen (NORCO) 7.5-325 MG tablet Take 1 tablet by mouth 4 (four) times daily as needed. 07/08/22   [provider]  medroxyPROGESTERone (PROVERA) 5 MG tablet Take 5 mg by mouth daily. 10/21/20   [provider]  OVER THE COUNTER MEDICATION Take 2 capsules by mouth daily. Zinc, vit C and Vit D    [provider]  pantoprazole (PROTONIX) 40 MG tablet Take 1 tablet (40 mg total) by mouth daily. 04/27/22   [name], MD  sucralfate (CARAFATE) 1 g tablet Take 1 g by mouth 4 (four) times daily. 07/06/22   [provider]  traMADol (ULTRAM) 50 MG tablet Take 100 mg by mouth every morning.    [provider]    Current Facility-Administered Medications  Medication Dose Route Frequency Provider Last Rate Last Admin   0.9 %  sodium chloride infusion  500 mL Intravenous Once [name], MD       acetaminophen (TYLENOL) tablet 650 mg  650 mg Oral Q6H PRN Reubin Milan, MD       Or   acetaminophen (TYLENOL) suppository 650 mg  650 mg Rectal Q6H PRN Reubin Milan, MD       lactated ringers infusion   Intravenous Continuous Reubin Milan, MD 125 mL/hr at 07/27/22 1038 New Bag at 07/27/22 1038   ondansetron (ZOFRAN) tablet 4 mg  4 mg Oral  Q6H PRN  Reubin Milan, MD       Or   ondansetron [facility]) injection 4 mg  4 mg Intravenous Q6H PRN Reubin Milan, MD       [START ON 07/30/2022] pantoprazole (PROTONIX) injection 40 mg  40 mg Intravenous Q12H Reubin Milan, MD       pantoprozole (PROTONIX) 80 mg /NS 100 mL infusion  8 mg/hr Intravenous Continuous Reubin Milan, MD 10 mL/hr at 07/27/22 1041 8 mg/hr at 07/27/22 1041   Current Outpatient Medications  Medication Sig Dispense Refill   Collagen-Vitamin C-Biotin (COLLAGEN 1500/C PO) Take 3 capsules by mouth daily.     diltiazem (CARDIZEM CD) 180 MG 24 hr capsule Take 1 capsule (180 mg total) by mouth daily. 90 capsule 3   estrogens-methylTEST 1.25-2.5 MG TABS tablet Take 1 tablet by mouth daily.     ferrous sulfate 325 (65 FE) MG EC tablet Take 1 tablet (325 mg total) by mouth 2 (two) times daily. 60 tablet 3   HYDROcodone-acetaminophen (NORCO) 7.5-325 MG tablet Take 1 tablet by mouth 4 (four) times daily as needed.     medroxyPROGESTERone (PROVERA) 5 MG tablet Take 5 mg by mouth daily.     OVER THE COUNTER MEDICATION Take 2 capsules by mouth daily. Zinc, vit C and Vit D     pantoprazole (PROTONIX) 40 MG tablet Take 1 tablet (40 mg total) by mouth daily. 90 tablet 3   sucralfate (CARAFATE) 1 g tablet Take 1 g by mouth 4 (four) times daily.     traMADol (ULTRAM) 50 MG tablet Take 100 mg by mouth every morning.      Allergies as of 07/27/2022   (No Known Allergies)    Social History   Socioeconomic History   Marital status: Married    Spouse name: Not on file   Number of children: Not on file   Years of education: Not on file   Highest education level: Not on file  Occupational History   Occupation: [employer]  Tobacco Use   Smoking status: Former    Packs/day: 1.00    Years: 20.00    Total pack years: 20.00    Types: Cigarettes    Quit date: 09/07/2002    Years since quitting: 19.8   Smokeless tobacco: Never  Vaping Use   Vaping Use: Never used   Substance and Sexual Activity   Alcohol use: Yes    Comment: occ   Drug use: Never   Sexual activity: Yes    Birth control/protection: Post-menopausal  Other Topics Concern   Not on file  Social History Narrative   Not on file   Social Determinants of Health   Financial Resource Strain: Not on file  Food Insecurity: Not on file  Transportation Needs: Not on file  Physical Activity: Not on file  Stress: Not on file  Social Connections: Not on file  Intimate Partner Violence: Not on file    Review of Systems: All systems reviewed and negative except where noted in HPI.  Physical Exam: Vital signs in last 24 hours: Temp:  [98.2 F (36.8 C)] 98.2 F (36.8 C) (11/20 0756) Pulse Rate:  [78-93] 79 (11/20 0930) Resp:  [17-18] 17 (11/20 0930) BP: (118-139)/(69-78) 120/75 (11/20 0930) SpO2:  [93 %-99 %] 94 % (11/20 0930) Weight:  [85.7 kg] 85.7 kg (11/20 0756)    General:  Alert female in NAD Psych:  Pleasant, cooperative. Normal mood and affect Eyes: Pupils equal Ears:  Normal  auditory acuity Nose: No deformity, discharge or lesions Neck:  Supple, no masses felt Lungs:  Clear to auscultation.  Heart:  Regular rate, regular rhythm. No lower extremity edema Abdomen:  Soft, nondistended, nontender, active bowel sounds, no masses felt Rectal :  Deferred Msk: Symmetrical without gross deformities.  Neurologic:  Alert, oriented, grossly normal neurologically Skin:  Intact without significant lesions.    Intake/Output from previous day: No intake/output data recorded. Intake/Output this shift:  Total I/O In: 100 [IV Piggyback:100] Out: -     Principal Problem:   UGI bleed    Tye Savoy, NP-C @  07/27/2022, 11:01 AM

## 2022-07-27 NOTE — ED Provider Notes (Signed)
[facility] DEPT Provider Note   CSN: [id number] Arrival date & time: 07/27/22  0749     History  Chief Complaint  Patient presents with   Hematemesis    [name] is a 61 y.o. female.  Patient is a 61 year old female with a past medical history of GI bleed and iron deficiency anemia, A-fib not on anticoagulation presenting to the emergency department with hematemesis.  The patient states that she woke up early this morning with nausea and vomiting.  She states that she has vomited bright red blood with blood clots about 4 times since she woke up this morning.  She states that she is having mild discomfort in her lower abdomen.  She states that her stools have been dark but states that they are always dark due to her iron supplement.  She states that she has had constipation recently.  She denies any fevers or chills.  She denies any NSAID use.  She states that she recently had an endoscopy and camera study within the last month to determine her source of bleeding.  The history is provided by the patient and the spouse.       Home Medications Prior to Admission medications   Medication Sig Start Date End Date Taking? Authorizing Provider  Collagen-Vitamin C-Biotin (COLLAGEN 1500/C PO) Take 3 capsules by mouth daily.    [provider]  diltiazem (CARDIZEM CD) 180 MG 24 hr capsule Take 1 capsule (180 mg total) by mouth daily. 09/09/21   Imogene Burn, PA-C  estrogens-methylTEST 1.25-2.5 MG TABS tablet Take 1 tablet by mouth daily.    [provider]  ferrous sulfate 325 (65 FE) MG EC tablet Take 1 tablet (325 mg total) by mouth 2 (two) times daily. 05/04/22   [name], MD  HYDROcodone-acetaminophen (NORCO) 7.5-325 MG tablet Take 1 tablet by mouth 4 (four) times daily as needed. 07/08/22   [provider]  medroxyPROGESTERone (PROVERA) 5 MG tablet Take 5 mg by mouth daily. 10/21/20   [provider]  OVER THE  COUNTER MEDICATION Take 2 capsules by mouth daily. Zinc, vit C and Vit D    [provider]  pantoprazole (PROTONIX) 40 MG tablet Take 1 tablet (40 mg total) by mouth daily. 04/27/22   [name], MD  sucralfate (CARAFATE) 1 g tablet Take 1 g by mouth 4 (four) times daily. 07/06/22   [provider]  traMADol (ULTRAM) 50 MG tablet Take 100 mg by mouth every morning.    [provider]      Allergies    Patient has no known allergies.    Review of Systems   Review of Systems  Physical Exam Updated Vital Signs BP 120/75   Pulse 79   Temp 98.2 F (36.8 C) (Oral)   Resp 17   Ht '5\' 2"'$  (1.575 m)   Wt 85.7 kg   SpO2 94%   BMI 34.57 kg/m  Physical Exam Vitals and nursing note reviewed.  Constitutional:      General: She is not in acute distress.    Appearance: Normal appearance.  HENT:     Head: Normocephalic and atraumatic.     Nose: Nose normal.     Mouth/Throat:     Mouth: Mucous membranes are moist.     Pharynx: Oropharynx is clear.     Comments: No blood in posterior oropharynx Eyes:     Extraocular Movements: Extraocular movements intact.     Conjunctiva/sclera:  Conjunctivae normal.  Cardiovascular:     Rate and Rhythm: Normal rate and regular rhythm.     Pulses: Normal pulses.     Heart sounds: Normal heart sounds.  Pulmonary:     Effort: Pulmonary effort is normal.     Breath sounds: Normal breath sounds.  Abdominal:     General: Abdomen is flat.     Palpations: Abdomen is soft.     Tenderness: There is no abdominal tenderness.  Musculoskeletal:        General: Normal range of motion.     Cervical back: Normal range of motion and neck supple.  Skin:    General: Skin is warm and dry.  Neurological:     General: No focal deficit present.     Mental Status: She is alert and oriented to person, place, and time.  Psychiatric:        Mood and Affect: Mood normal.        Behavior: Behavior normal.     ED Results / Procedures /  Treatments   Labs (all labs ordered are listed, but only abnormal results are displayed) Labs Reviewed  COMPREHENSIVE METABOLIC PANEL - Abnormal; Notable for the following components:      Result Value   Glucose, Bld 114 (*)    Calcium 8.8 (*)    Total Protein 6.4 (*)    Albumin 3.4 (*)    All other components within normal limits  CBC WITH DIFFERENTIAL/PLATELET - Abnormal; Notable for the following components:   WBC 11.0 (*)    Neutro Abs 8.4 (*)    All other components within normal limits  PROTIME-INR  POC OCCULT BLOOD, ED  TYPE AND SCREEN    EKG None  Radiology No results found.  Procedures Procedures    Medications Ordered in ED Medications  ondansetron (ZOFRAN) injection 4 mg (4 mg Intravenous Given 07/27/22 0842)  pantoprazole (PROTONIX) 80 mg /NS 100 mL IVPB (80 mg Intravenous New Bag/Given 07/27/22 7824)    ED Course/ Medical Decision Making/ A&P Clinical Course as of 07/27/22 0956  Mon Jul 27, 2022  0934 Patient's hemoglobin is stable at 12.8 though most recent hemoglobins were 14-15 last month.  GI will be consulted for further recommendations for disposition. [VK]  [id number] I spoke with Tye Savoy NP of [facility] who recommended admission and will evaluate the patient for further recommendations. [VK]  [phone] Patient signed out to Dr. Olevia Bowens, hospitalist.  [VK]    Clinical Course User Index [VK] Kemper Durie, DO                           Medical Decision Making This patient presents to the ED with chief complaint(s) of hematemesis with pertinent past medical history of anemia, GI bleed, A-fib not on anticoagulation which further complicates the presenting complaint. The complaint involves an extensive differential diagnosis and also carries with it a high risk of complications and morbidity.    The differential diagnosis includes patient had no varices on prior EGD making variceal bleed unlikely, considering likely other source of upper GI bleed with  bright red blood in vomitus, less likely lower GI bleed, no evidence of blood in the nose or oropharynx as cause of her bleeding, considering anemia, coagulopathy  Additional history obtained: Additional history obtained from spouse Records reviewed GI records, recent endoscopy and camera study showed possible AVM in the duodenum  ED Course and Reassessment: Upon patient's arrival to the  emergency department she is awake and alert and hemodynamically stable.  She will have labs performed to evaluate for anemia and coagulopathy.  She was given Zofran and PPI.  GI will be consulted once her labs have resulted as long as she remains stable.  Independent labs interpretation:  The following labs were independently interpreted: Hemoglobin downtrending to 12.8, otherwise within normal range  Independent visualization of imaging: N/A  Consultation: - Consulted or discussed management/test interpretation w/ external professional: GI, hospitalist  Consideration for admission or further workup: Patient requires admission for further work-up and evaluation of her hematemesis Social Determinants of health: N/A    Amount and/or Complexity of Data Reviewed Labs: ordered.  Risk Prescription drug management. Decision regarding hospitalization.          Final Clinical Impression(s) / ED Diagnoses Final diagnoses:  Hematemesis with nausea    Rx / DC Orders ED Discharge Orders     None         Kemper Durie, DO 07/27/22 [phone]

## 2022-07-27 NOTE — ED Triage Notes (Signed)
Patient reports that she has been vomiting bright red blood with clots x 3-4 times this AM. Patient denies any abdominal pain. Patient reports a history of Upper GI bleed

## 2022-07-27 NOTE — Consult Note (Signed)
Consultation Note   Referring Provider:  [facility] PCP: Redmond School, MD Primary Gastroenterologist: Scarlette Shorts, MD   Reason for consultation: hematemesis  Hospital Day: 1  Assessment    #61 year old female with hemodynamically stable upper GI bleed ( bloody emesis x 4 ).   BUN is normal.  She is hemodynamically stable.  DDx include PUD, esophagitis, Mallory Weiss tear, Dieulafoy's lesion. She has a moderate size hiatal hernia but no Cameron's lesion on EGD in July.   # Acute on chronic anemia secondary to above. Hgb 12.8, down from 15.4 on 11/2.  Followed by Hematology for IDA. Takes daily oral iron  #Chronic constipation exacerbated by oral iron  # History of multiple colon polyps, surveillance colonoscopy due December 2027  #Chronic atrial fibrillation, not anticoagulated.    # See PMH for additional medical problems   Plan  Clear liquids okay, NPO after MN Anti-emetics as needed Continue PPI infusion. Can adjust dose if needed after EGD Trend hgb, transfuse if hgb < 7 Schedule for EGD. The risks and benefits of EGD with possible biopsies were discussed with the patient who agrees to proceed.  Two colace at bedtime, trial of daily Benefiber as outpatient   HPI   [name] is a 61 y.o. female with a past medical history significant for GERD, esophageal stricture, moderate size hiatal hernia  colon polyps, iron deficiency anemia , small bowel AVMs , diverticulosis , chronic atrial fibrillation ( not anticoagulated).  See PMH for any additional medical problems.  Patient  was evaluated in our office 04/01/22, please refer to that office note for details.  In summary she had been hospitalized at [facility] in [place] with syncope and fatigue.  She was found to have significant iron deficiency anemia with a hemoglobin of 5.1.  She was transfused 2 units of blood.  Postinfusion hemoglobin was 7.1.  We  scheduled her for an EGD which didn't show any source for recent bleeding ( ( Please see full report below). She then underwent a VCE on 04/21/22 with findings of a probable AVM at the 30-minute mark just beyond the first duodenal image (( Please see full report below).  She is followed by hematology for anemia and currently taking daily iron which makes her stools dark.     Interval History :  [name] went to bed feeling okay last night.  She woke up early this morning, took a shower and subsequently felt nauseated She had a small amount of dark emesis.  This was then followed by 4 episodes of bright red emesis with clots.  No preceding abdominal pain though she does have some upper abdominal discomfort since vomiting.  She is not having any melena or hematochezia.  It has been several hours since her last episode of vomiting (she has not vomited in the ED). She doesn't take any NSAIDs.  She is not anticoagulated.   Kamyia gives a history of chronic constipation which has been worse since starting iron. She doesn't really like the taste of Miralax and hasn't really tried anything else.   Roxsana has has no GERD symptoms but tells me she that she has been told she has acid reflux.  She takes pantoprazole once a day.  She takes diltiazem for atrial fibrillation.   ED Vital signs are stable Significant studies BUN normal at 19 WBC 11 hemoglobin 12.8  Previous GI Evaluation     04/02/22 EGD Benign esophageal stricture 2. Moderate hiatal hernia without Cameron erosions 3. Incidental small benign fundic gland polyps 4. Otherwise normal EGD. Status post duodenal biopsies 5. No cause for iron deficiency anemia found. Rule out more distal small bowel lesion.  04/21/2022 video capsule study -Complete capsule study with adequate prep -Probable AVM at 30-minute mark which is 1 minute beyond first duodenal image  -Streak of blood from 33-minute mark through the 35-minute mark without visible lesion  Recent  Labs and Imaging MM 3D SCREEN BREAST BILATERAL  Result Date: 07/15/2022 CLINICAL DATA:  Screening. EXAM: DIGITAL SCREENING BILATERAL MAMMOGRAM WITH TOMOSYNTHESIS AND CAD TECHNIQUE: Bilateral screening digital craniocaudal and mediolateral oblique mammograms were obtained. Bilateral screening digital breast tomosynthesis was performed. The images were evaluated with computer-aided detection. COMPARISON:  Previous exam(s). ACR Breast Density Category b: There are scattered areas of fibroglandular density. FINDINGS: There are no findings suspicious for malignancy. IMPRESSION: No mammographic evidence of malignancy. A result letter of this screening mammogram will be mailed directly to the patient. RECOMMENDATION: Screening mammogram in one year. (Code:SM-B-01Y) BI-RADS CATEGORY  1: Negative. Electronically Signed   By: Valentino Saxon M.D.   On: 07/15/2022 08:22    Labs:  Recent Labs    07/27/22 0831  WBC 11.0*  HGB 12.8  HCT 39.9  PLT 303   Recent Labs    07/27/22 0831  NA 142  K 4.0  CL 108  CO2 28  GLUCOSE 114*  BUN 19  CREATININE 0.68  CALCIUM 8.8*   Recent Labs    07/27/22 0831  PROT 6.4*  ALBUMIN 3.4*  AST 22  ALT 35  ALKPHOS 49  BILITOT 0.6   No results for input(s): "HEPBSAG", "HCVAB", "HEPAIGM", "HEPBIGM" in the last 72 hours. No results for input(s): "LABPROT", "INR" in the last 72 hours.  Past Medical History:  Diagnosis Date   Arthritis    Cancer ([place])    Skin on left hand   DDD (degenerative disc disease), lumbar    DDD (degenerative disc disease), lumbosacral    GERD (gastroesophageal reflux disease)    Uses OTC medication as needed   History of avascular necrosis of capital femoral epiphysis    s/p THA right   History of mitral valve prolapse    per pt previous has seen cardiologist , dr gamble , yrs ago--- echo scanned in epic dated 04-22-2007 (scanned on dated 06-20-2010)  shows no evidence mvp, ef >55%, trace MR/TR and mild AV sclerosis but no  stenosis or regurg.   History of vulvar dysplasia    VIN3   Iron deficiency anemia due to chronic blood loss 05/05/2022   Lumbar spondylosis    OA (osteoarthritis)    PMB (postmenopausal bleeding)    Scoliosis    Wears glasses     Past Surgical History:  Procedure Laterality Date   ANTERIOR CERVICAL DECOMP/DISCECTOMY FUSION  11-25-2009  '@[facility]    C4 --- C7   ANTERIOR LAT LUMBAR FUSION Left 10/22/2016   Procedure: Anterior Lateral Lumbar Fusion - Lumbar two - lumbar three, with Lateral plate;  Surgeon: Kary Kos, MD;  Location: [facility];  Service: Neurosurgery;  Laterality: Left;  Anterior Lateral Lumbar Fusion - Lumbar two - lumbar three, with Lateral plate   CARDIAC CATHETERIZATION  age 10   CO2 LASER APPLICATION [name]  07/03/2014   Procedure: CO2 LASER APPLICATION;  Surgeon: Gus Height, MD;  Location: [facility];  Service: Gynecology;  Laterality: [name];   COLONOSCOPY W/ POLYPECTOMY  06/21/2017   Dr.Perry   HYSTEROSCOPY WITH D & C [name] 08/27/2020   Procedure: DILATATION AND CURETTAGE /HYSTEROSCOPY;  Surgeon: Vanessa Kick, MD;  Location: [facility];  Service: Gynecology;  Laterality: [name];   LAPAROSCOPIC CHOLECYSTECTOMY  04/2006   LEG SURGERY Right 1983   femoral artery repair from cardiac cath injury    OPEN REDUCTION INTERNAL FIXATION (ORIF) METACARPAL Right 08/12/2017   Procedure: OPEN REDUCTION INTERNAL FIXATION (ORIF) 5th METACARPAL fracture;  Surgeon: Roseanne Kaufman, MD;  Location: [facility];  Service: Orthopedics;  Laterality: Right;  60 mins   SPINAL FIXATION SURGERY  1973   age 51 - sclosis   TOTAL HIP ARTHROPLASTY Right 06-16-2010 '@[facility]    UPPER GASTROINTESTINAL ENDOSCOPY     VSD REPAIR  age 89   VULVECTOMY [name] 09/18/2015   Procedure: WIDE EXCISION VULVECTOMY;  Surgeon: Vanessa Kick, MD;  Location: [facility];  Service: Gynecology;  Laterality: [name];    Family History  Problem Relation Age of Onset   COPD Mother    Diabetes Father    Pneumonia Father    Alcoholism Father     Diabetes Other    Heart disease Other    Colon cancer Neg Hx    Esophageal cancer Neg Hx    Stomach cancer Neg Hx     Prior to Admission medications   Medication Sig Start Date End Date Taking? Authorizing Provider  Collagen-Vitamin C-Biotin (COLLAGEN 1500/C PO) Take 3 capsules by mouth daily.    [provider]  diltiazem (CARDIZEM CD) 180 MG 24 hr capsule Take 1 capsule (180 mg total) by mouth daily. 09/09/21   Imogene Burn, PA-C  estrogens-methylTEST 1.25-2.5 MG TABS tablet Take 1 tablet by mouth daily.    [provider]  ferrous sulfate 325 (65 FE) MG EC tablet Take 1 tablet (325 mg total) by mouth 2 (two) times daily. 05/04/22   [name], MD  HYDROcodone-acetaminophen (NORCO) 7.5-325 MG tablet Take 1 tablet by mouth 4 (four) times daily as needed. 07/08/22   [provider]  medroxyPROGESTERone (PROVERA) 5 MG tablet Take 5 mg by mouth daily. 10/21/20   [provider]  OVER THE COUNTER MEDICATION Take 2 capsules by mouth daily. Zinc, vit C and Vit D    [provider]  pantoprazole (PROTONIX) 40 MG tablet Take 1 tablet (40 mg total) by mouth daily. 04/27/22   [name], MD  sucralfate (CARAFATE) 1 g tablet Take 1 g by mouth 4 (four) times daily. 07/06/22   [provider]  traMADol (ULTRAM) 50 MG tablet Take 100 mg by mouth every morning.    [provider]    Current Facility-Administered Medications  Medication Dose Route Frequency Provider Last Rate Last Admin   0.9 %  sodium chloride infusion  500 mL Intravenous Once [name], MD       acetaminophen (TYLENOL) tablet 650 mg  650 mg Oral Q6H PRN Reubin Milan, MD       Or   acetaminophen (TYLENOL) suppository 650 mg  650 mg Rectal Q6H PRN Reubin Milan, MD       lactated ringers infusion   Intravenous Continuous Reubin Milan, MD 125 mL/hr at 07/27/22 1038 New Bag at 07/27/22 1038   ondansetron (ZOFRAN) tablet 4 mg  4 mg Oral  Q6H PRN  Reubin Milan, MD       Or   ondansetron [facility]) injection 4 mg  4 mg Intravenous Q6H PRN Reubin Milan, MD       [START ON 07/30/2022] pantoprazole (PROTONIX) injection 40 mg  40 mg Intravenous Q12H Reubin Milan, MD       pantoprozole (PROTONIX) 80 mg /NS 100 mL infusion  8 mg/hr Intravenous Continuous Reubin Milan, MD 10 mL/hr at 07/27/22 1041 8 mg/hr at 07/27/22 1041   Current Outpatient Medications  Medication Sig Dispense Refill   Collagen-Vitamin C-Biotin (COLLAGEN 1500/C PO) Take 3 capsules by mouth daily.     diltiazem (CARDIZEM CD) 180 MG 24 hr capsule Take 1 capsule (180 mg total) by mouth daily. 90 capsule 3   estrogens-methylTEST 1.25-2.5 MG TABS tablet Take 1 tablet by mouth daily.     ferrous sulfate 325 (65 FE) MG EC tablet Take 1 tablet (325 mg total) by mouth 2 (two) times daily. 60 tablet 3   HYDROcodone-acetaminophen (NORCO) 7.5-325 MG tablet Take 1 tablet by mouth 4 (four) times daily as needed.     medroxyPROGESTERone (PROVERA) 5 MG tablet Take 5 mg by mouth daily.     OVER THE COUNTER MEDICATION Take 2 capsules by mouth daily. Zinc, vit C and Vit D     pantoprazole (PROTONIX) 40 MG tablet Take 1 tablet (40 mg total) by mouth daily. 90 tablet 3   sucralfate (CARAFATE) 1 g tablet Take 1 g by mouth 4 (four) times daily.     traMADol (ULTRAM) 50 MG tablet Take 100 mg by mouth every morning.      Allergies as of 07/27/2022   (No Known Allergies)    Social History   Socioeconomic History   Marital status: Married    Spouse name: Not on file   Number of children: Not on file   Years of education: Not on file   Highest education level: Not on file  Occupational History   Occupation: [employer]  Tobacco Use   Smoking status: Former    Packs/day: 1.00    Years: 20.00    Total pack years: 20.00    Types: Cigarettes    Quit date: 09/07/2002    Years since quitting: 19.8   Smokeless tobacco: Never  Vaping Use   Vaping Use: Never used   Substance and Sexual Activity   Alcohol use: Yes    Comment: occ   Drug use: Never   Sexual activity: Yes    Birth control/protection: Post-menopausal  Other Topics Concern   Not on file  Social History Narrative   Not on file   Social Determinants of Health   Financial Resource Strain: Not on file  Food Insecurity: Not on file  Transportation Needs: Not on file  Physical Activity: Not on file  Stress: Not on file  Social Connections: Not on file  Intimate Partner Violence: Not on file    Review of Systems: All systems reviewed and negative except where noted in HPI.  Physical Exam: Vital signs in last 24 hours: Temp:  [98.2 F (36.8 C)] 98.2 F (36.8 C) (11/20 0756) Pulse Rate:  [78-93] 79 (11/20 0930) Resp:  [17-18] 17 (11/20 0930) BP: (118-139)/(69-78) 120/75 (11/20 0930) SpO2:  [93 %-99 %] 94 % (11/20 0930) Weight:  [85.7 kg] 85.7 kg (11/20 0756)    General:  Alert female in NAD Psych:  Pleasant, cooperative. Normal mood and affect Eyes: Pupils equal Ears:  Normal  auditory acuity Nose: No deformity, discharge or lesions Neck:  Supple, no masses felt Lungs:  Clear to auscultation.  Heart:  Regular rate, regular rhythm. No lower extremity edema Abdomen:  Soft, nondistended, nontender, active bowel sounds, no masses felt Rectal :  Deferred Msk: Symmetrical without gross deformities.  Neurologic:  Alert, oriented, grossly normal neurologically Skin:  Intact without significant lesions.    Intake/Output from previous day: No intake/output data recorded. Intake/Output this shift:  Total I/O In: 100 [IV Piggyback:100] Out: -     Principal Problem:   UGI bleed    Tye Savoy, NP-C @  07/27/2022, 11:01 AM

## 2022-07-27 NOTE — H&P (Signed)
History and Physical    Patient: [name] DOB: 1961/09/07 DOA: 07/27/2022 DOS: the patient was seen and examined on 07/27/2022 PCP: Redmond School, MD  Patient coming from: Home  Chief Complaint:  Chief Complaint  Patient presents with   Hematemesis   HPI: [name] is a 61 y.o. female with medical history significant of osteoarthritis, cancer, DDD of lumbosacral spine, GERD, avascular necrosis of the right hip, history of mitral valve prolapse and repair, history of vulvar dysplasia, iron deficiency anemia due to chronic blood loss class I obesity who is coming to the emergency department with a history of waking up this morning having nausea followed by 3 episodes of hematemesis with bright red blood in clots.  She is not sure if she has had melena because she takes iron supplement.  No EtOH use, no OTC or prescription NSAID use, no spicy foods, she drinks a couple coffee daily in the mornings.  She denied fever, chills, rhinorrhea, sore throat, wheezing or hemoptysis.  No chest pain, palpitations, diaphoresis, PND, orthopnea or pitting edema of the lower extremities.  No flank pain, dysuria, frequency or hematuria.  No polyuria, polydipsia, polyphagia or blurred vision.   ED course: Initial vital signs were temperature 98.2 F, pulse 93, respiration 18, BP 139/69 mmHg O2 sat 99% on room air.  The patient received ondansetron 4 mg IVP and 80 mg of Protonix IVPB.  Lab work: CBC showed a white count of 11.0, hemoglobin 12.8 g/dL platelets 303.  PT and INR were normal.  CMP with a glucose of 114, total protein 6.4 and albumin 3.4.  The rest of the CMP was normal.   Review of Systems: As mentioned in the history of present illness. All other systems reviewed and are negative.  Past Medical History:  Diagnosis Date   Arthritis    Cancer ([place])    Skin on left hand   DDD (degenerative disc disease), lumbar    DDD (degenerative disc disease), lumbosacral    GERD  (gastroesophageal reflux disease)    Uses OTC medication as needed   History of avascular necrosis of capital femoral epiphysis    s/p THA right   History of mitral valve prolapse    per pt previous has seen cardiologist , dr gamble , yrs ago--- echo scanned in epic dated 04-22-2007 (scanned on dated 06-20-2010)  shows no evidence mvp, ef >55%, trace MR/TR and mild AV sclerosis but no stenosis or regurg.   History of vulvar dysplasia    VIN3   Iron deficiency anemia due to chronic blood loss 05/05/2022   Lumbar spondylosis    OA (osteoarthritis)    PMB (postmenopausal bleeding)    Scoliosis    Wears glasses    Past Surgical History:  Procedure Laterality Date   ANTERIOR CERVICAL DECOMP/DISCECTOMY FUSION  11-25-2009  '@[facility]    C4 --- C7   ANTERIOR LAT LUMBAR FUSION Left 10/22/2016   Procedure: Anterior Lateral Lumbar Fusion - Lumbar two - lumbar three, with Lateral plate;  Surgeon: Kary Kos, MD;  Location: [facility];  Service: Neurosurgery;  Laterality: Left;  Anterior Lateral Lumbar Fusion - Lumbar two - lumbar three, with Lateral plate   CARDIAC CATHETERIZATION  age 74   CO2 LASER APPLICATION N/A 59/16/3846   Procedure: CO2 LASER APPLICATION;  Surgeon: Gus Height, MD;  Location: [facility];  Service: Gynecology;  Laterality: N/A;   COLONOSCOPY W/ POLYPECTOMY  06/21/2017   Dr.Perry   HYSTEROSCOPY WITH D & C N/A  08/27/2020   Procedure: DILATATION AND CURETTAGE /HYSTEROSCOPY;  Surgeon: Vanessa Kick, MD;  Location: [facility];  Service: Gynecology;  Laterality: N/A;   LAPAROSCOPIC CHOLECYSTECTOMY  04/2006   LEG SURGERY Right 1983   femoral artery repair from cardiac cath injury    OPEN REDUCTION INTERNAL FIXATION (ORIF) METACARPAL Right 08/12/2017   Procedure: OPEN REDUCTION INTERNAL FIXATION (ORIF) 5th METACARPAL fracture;  Surgeon: Roseanne Kaufman, MD;  Location: [facility];  Service: Orthopedics;  Laterality: Right;  60 mins   SPINAL FIXATION SURGERY  1973   age 2 - sclosis   TOTAL  HIP ARTHROPLASTY Right 06-16-2010 '@[facility]    UPPER GASTROINTESTINAL ENDOSCOPY     VSD REPAIR  age 24   VULVECTOMY N/A 09/18/2015   Procedure: WIDE EXCISION VULVECTOMY;  Surgeon: Vanessa Kick, MD;  Location: [facility];  Service: Gynecology;  Laterality: N/A;   Social History:  reports that she quit smoking about 19 years ago. Her smoking use included cigarettes. She has a 20.00 pack-year smoking history. She has never used smokeless tobacco. She reports current alcohol use. She reports that she does not use drugs.  No Known Allergies  Family History  Problem Relation Age of Onset   COPD Mother    Diabetes Father    Pneumonia Father    Alcoholism Father    Diabetes Other    Heart disease Other    Colon cancer Neg Hx    Esophageal cancer Neg Hx    Stomach cancer Neg Hx     Prior to Admission medications   Medication Sig Start Date End Date Taking? Authorizing Provider  Collagen-Vitamin C-Biotin (COLLAGEN 1500/C PO) Take 3 capsules by mouth daily.    [provider]  diltiazem (CARDIZEM CD) 180 MG 24 hr capsule Take 1 capsule (180 mg total) by mouth daily. 09/09/21   Imogene Burn, PA-C  estrogens-methylTEST 1.25-2.5 MG TABS tablet Take 1 tablet by mouth daily.    [provider]  ferrous sulfate 325 (65 FE) MG EC tablet Take 1 tablet (325 mg total) by mouth 2 (two) times daily. 05/04/22   [name], MD  HYDROcodone-acetaminophen (NORCO) 7.5-325 MG tablet Take 1 tablet by mouth 4 (four) times daily as needed. 07/08/22   [provider]  medroxyPROGESTERone (PROVERA) 5 MG tablet Take 5 mg by mouth daily. 10/21/20   [provider]  OVER THE COUNTER MEDICATION Take 2 capsules by mouth daily. Zinc, vit C and Vit D    [provider]  pantoprazole (PROTONIX) 40 MG tablet Take 1 tablet (40 mg total) by mouth daily. 04/27/22   [name], MD  sucralfate (CARAFATE) 1 g tablet Take 1 g by mouth 4 (four) times daily. 07/06/22   [provider]   traMADol (ULTRAM) 50 MG tablet Take 100 mg by mouth every morning.    [provider]    Physical Exam: Vitals:   07/27/22 0756 07/27/22 0850 07/27/22 0900 07/27/22 0930  BP: 139/69 121/78 118/69 120/75  Pulse: 93 82 78 79  Resp: '18 18 17 17  '$ Temp: 98.2 F (36.8 C)     TempSrc: Oral     SpO2: 99% 93% 95% 94%  Weight: 85.7 kg     Height: '5\' 2"'$  (1.575 m)      Physical Exam Vitals and nursing note reviewed.  Constitutional:      Appearance: She is obese.  HENT:     Head: Normocephalic.     Nose: No rhinorrhea.  Mouth/Throat:     Mouth: Mucous membranes are moist.  Eyes:     General: No scleral icterus.    Pupils: Pupils are equal, round, and reactive to light.  Cardiovascular:     Rate and Rhythm: Normal rate and regular rhythm.  Pulmonary:     Effort: Pulmonary effort is normal.     Breath sounds: Normal breath sounds.  Abdominal:     General: Bowel sounds are normal.     Palpations: Abdomen is soft.     Tenderness: There is no abdominal tenderness.  Musculoskeletal:     Cervical back: Neck supple.     Right lower leg: No edema.     Left lower leg: No edema.  Skin:    General: Skin is warm and dry.  Neurological:     General: No focal deficit present.     Mental Status: She is alert and oriented to person, place, and time.  Psychiatric:        Mood and Affect: Mood normal.        Behavior: Behavior normal.   Data Reviewed:  Results are pending, will review when available.  Assessment and Plan: Principal Problem:   UGI bleed Admit to PCU/inpatient. Clear liquid diet. N.p.o. after midnight. Continue IV fluids. Continue pantoprazole infusion. Monitor H&H. Transfuse as needed. GI consult greatly appreciated. EGD in AM.  Active Problems:   GERD (gastroesophageal reflux disease) On parenteral Protonix now.    Paroxysmal atrial fibrillation (HCC) Anticoagulation contraindicated. Continue Cardizem 180 mg p.o. daily.    Class 1  obesity Current BMI 34.57 kg/m. Will need lifestyle modifications. Follow-up with PCP and/or bariatric medicine.       Advance Care Planning:   Code Status: Full Code   Consults: [facility].  Family Communication:   Severity of Illness: The appropriate patient status for this patient is INPATIENT. Inpatient status is judged to be reasonable and necessary in order to provide the required intensity of service to ensure the patient's safety. The patient's presenting symptoms, physical exam findings, and initial radiographic and laboratory data in the context of their chronic comorbidities is felt to place them at high risk for further clinical deterioration. Furthermore, it is not anticipated that the patient will be medically stable for discharge from the hospital within 2 midnights of admission.   * I certify that at the point of admission it is my clinical judgment that the patient will require inpatient hospital care spanning beyond 2 midnights from the point of admission due to high intensity of service, high risk for further deterioration and high frequency of surveillance required.*  Author: Reubin Milan, MD 07/27/2022 10:01 AM  For on call review [email or web address].   This document was prepared using Dragon voice recognition software and may contain some unintended transcription errors.

## 2022-07-27 NOTE — Telephone Encounter (Signed)
Patient of Dr. Blanch Media. Woke this AM with hematemesis. Husband currently driving patient to [facility] ER. Will relay to our [facility] inpatient team for likely admission.

## 2022-07-28 ENCOUNTER — Inpatient Hospital Stay (HOSPITAL_COMMUNITY): Payer: PRIVATE HEALTH INSURANCE | Admitting: Anesthesiology

## 2022-07-28 ENCOUNTER — Inpatient Hospital Stay (HOSPITAL_COMMUNITY): Payer: PRIVATE HEALTH INSURANCE

## 2022-07-28 ENCOUNTER — Encounter (HOSPITAL_COMMUNITY)
Admission: EM | Disposition: A | Discharge status: Home / Self Care | Payer: Self-pay | Source: Home / Self Care | Attending: Internal Medicine

## 2022-07-28 ENCOUNTER — Encounter (HOSPITAL_COMMUNITY): Payer: Self-pay

## 2022-07-28 DIAGNOSIS — M1990 Unspecified osteoarthritis, unspecified site: Secondary | ICD-10-CM | POA: Diagnosis not present

## 2022-07-28 DIAGNOSIS — K920 Hematemesis: Secondary | ICD-10-CM | POA: Diagnosis not present

## 2022-07-28 DIAGNOSIS — L818 Other specified disorders of pigmentation: Secondary | ICD-10-CM | POA: Diagnosis not present

## 2022-07-28 DIAGNOSIS — K317 Polyp of stomach and duodenum: Secondary | ICD-10-CM

## 2022-07-28 DIAGNOSIS — K922 Gastrointestinal hemorrhage, unspecified: Secondary | ICD-10-CM | POA: Diagnosis not present

## 2022-07-28 DIAGNOSIS — Z87891 Personal history of nicotine dependence: Secondary | ICD-10-CM

## 2022-07-28 DIAGNOSIS — K31819 Angiodysplasia of stomach and duodenum without bleeding: Secondary | ICD-10-CM

## 2022-07-28 DIAGNOSIS — K31811 Angiodysplasia of stomach and duodenum with bleeding: Secondary | ICD-10-CM | POA: Diagnosis not present

## 2022-07-28 DIAGNOSIS — M5440 Lumbago with sciatica, unspecified side: Secondary | ICD-10-CM | POA: Diagnosis not present

## 2022-07-28 HISTORY — PX: SUBMUCOSAL TATTOO INJECTION: SHX6856

## 2022-07-28 HISTORY — PX: ENTEROSCOPY: SHX5533

## 2022-07-28 HISTORY — PX: HOT HEMOSTASIS: SHX5433

## 2022-07-28 HISTORY — PX: HEMOSTASIS CLIP PLACEMENT: SHX6857

## 2022-07-28 LAB — COMPREHENSIVE METABOLIC PANEL
ALT: 27 U/L (ref 0–44)
AST: 18 U/L (ref 15–41)
Albumin: 3 g/dL — ABNORMAL LOW (ref 3.5–5.0)
Alkaline Phosphatase: 41 U/L (ref 38–126)
Anion gap: 5 (ref 5–15)
BUN: 12 mg/dL (ref 8–23)
CO2: 28 mmol/L (ref 22–32)
Calcium: 8.5 mg/dL — ABNORMAL LOW (ref 8.9–10.3)
Chloride: 105 mmol/L (ref 98–111)
Creatinine, Ser: 0.71 mg/dL (ref 0.44–1.00)
GFR, Estimated: >60 mL/min (ref >60)
Glucose, Bld: 96 mg/dL (ref 70–99)
Potassium: 4.1 mmol/L (ref 3.5–5.1)
Sodium: 138 mmol/L (ref 135–145)
Total Bilirubin: 1 mg/dL (ref 0.3–1.2)
Total Protein: 5.4 g/dL — ABNORMAL LOW (ref 6.5–8.1)

## 2022-07-28 LAB — CBC
HCT: 33.7 % — ABNORMAL LOW (ref 36.0–46.0)
Hemoglobin: 10.8 g/dL — ABNORMAL LOW (ref 12.0–15.0)
MCH: 30.4 pg (ref 26.0–34.0)
MCHC: 32 g/dL (ref 30.0–36.0)
MCV: 94.9 fL (ref 80.0–100.0)
Platelets: 258 10*3/uL (ref 150–400)
RBC: 3.55 MIL/uL — ABNORMAL LOW (ref 3.87–5.11)
RDW: 15.2 % (ref 11.5–15.5)
WBC: 7.3 10*3/uL (ref 4.0–10.5)
nRBC: 0 % (ref 0.0–0.2)

## 2022-07-28 LAB — HIV ANTIBODY (ROUTINE TESTING W REFLEX): HIV Screen 4th Generation wRfx: NONREACTIVE

## 2022-07-28 SURGERY — ENTEROSCOPY
Anesthesia: Monitor Anesthesia Care

## 2022-07-28 MED ORDER — SODIUM CHLORIDE 0.9 % IV SOLN
3.0000 g | Freq: Four times a day (QID) | INTRAVENOUS | Status: DC
  Administered 2022-07-28 – 2022-07-29 (×4): 3 g via INTRAVENOUS
  Filled 2022-07-28 (×4): qty 8

## 2022-07-28 MED ORDER — PROPOFOL 500 MG/50ML IV EMUL
INTRAVENOUS | Status: DC | PRN
  Administered 2022-07-28: 125 ug/kg/min via INTRAVENOUS

## 2022-07-28 MED ORDER — LIDOCAINE 2% (20 MG/ML) 5 ML SYRINGE
INTRAMUSCULAR | Status: DC | PRN
  Administered 2022-07-28: 80 mg via INTRAVENOUS

## 2022-07-28 MED ORDER — PROMETHAZINE HCL 25 MG/ML IJ SOLN
INTRAMUSCULAR | Status: AC
  Filled 2022-07-28: qty 1

## 2022-07-28 MED ORDER — PROPOFOL 1000 MG/100ML IV EMUL
INTRAVENOUS | Status: AC
  Filled 2022-07-28: qty 100

## 2022-07-28 MED ORDER — PROMETHAZINE HCL 25 MG/ML IJ SOLN
6.2500 mg | INTRAMUSCULAR | Status: DC | PRN
  Administered 2022-07-28: 6.25 mg via INTRAVENOUS

## 2022-07-28 MED ORDER — SPOT INK MARKER SYRINGE KIT
PACK | SUBMUCOSAL | Status: DC | PRN
  Administered 2022-07-28: 3 mL via SUBMUCOSAL

## 2022-07-28 MED ORDER — SODIUM CHLORIDE (PF) 0.9 % IJ SOLN
INTRAMUSCULAR | Status: AC
  Filled 2022-07-28: qty 10

## 2022-07-28 MED ORDER — SODIUM CHLORIDE 0.9 % IV SOLN: 6.2500 mg | Freq: Once | INTRAVENOUS | Status: DC

## 2022-07-28 MED ORDER — PHENYLEPHRINE 80 MCG/ML (10ML) SYRINGE FOR IV PUSH (FOR BLOOD PRESSURE SUPPORT)
PREFILLED_SYRINGE | INTRAVENOUS | Status: DC | PRN
  Administered 2022-07-28 (×2): 160 ug via INTRAVENOUS

## 2022-07-28 MED ORDER — AMISULPRIDE (ANTIEMETIC) 5 MG/2ML IV SOLN
10.0000 mg | Freq: Once | INTRAVENOUS | Status: AC
  Administered 2022-07-28: 10 mg via INTRAVENOUS
  Filled 2022-07-28: qty 4

## 2022-07-28 MED ORDER — PROPOFOL 10 MG/ML IV BOLUS
INTRAVENOUS | Status: DC | PRN
  Administered 2022-07-28 (×2): 40 mg via INTRAVENOUS
  Administered 2022-07-28: 20 mg via INTRAVENOUS
  Administered 2022-07-28: 40 mg via INTRAVENOUS

## 2022-07-28 MED ORDER — ONDANSETRON HCL 4 MG/2ML IJ SOLN
INTRAMUSCULAR | Status: DC | PRN
  Administered 2022-07-28: 4 mg via INTRAVENOUS

## 2022-07-28 NOTE — Progress Notes (Signed)
Pt presented to endo today for enteroscopy with MD Mansouraty. Post-procedure pt c/o nausea unrelieved by ondansetron given by Noralyn Pick, CRNA. MDA Germeroth notified. Verbal order received for 10 mg barhemsys IV push. Medication given at 1417 as ordered. In the interim, pt had one episode of a moderate amount of bile emesis. MDA Germeroth notified and assessed pt at bedside at 1419. Pt's O2 saturations at 92 on 1 LPM [place]. Per MDA Germeroth, pt should be on continuous pulse ox monitoring on inpt unit. Tess, RN on 4W inpt unit notified. Pt's nausea was improved but still remained after the Barhemsys. MDA Germeroth was notified and a verbal order for 6.25 mg phenergan IV push was obtained. Medication given as ordered @ 1501. Pt required at times 4 LPM O2 by [place], however as of 1525, pt is satting 92-93% on 2 LPM by . As of 1530, pt reports improvement in nausea. MD Mansouraty at bedside. MDA Germeroth notified of pt's status. Per MDA Germeroth, okay to return to inpt unit. Pt returned to 4W inpt unit.  Debarah Crape, RN 07/28/22 3:52 PM

## 2022-07-28 NOTE — Telephone Encounter (Signed)
Patient evaluated in the inpatient setting.

## 2022-07-28 NOTE — Progress Notes (Signed)
Brief GI progress note  Patient evaluated in postprocedure area.  Initially the patient was on 2 L of O2 satting in the low 90s and over the course of her recovery, she developed progressive hypoxemia requiring increasing oxygen requirements up to 4 L to sat in the 92 to 93% range. Anesthesia evaluated the patient and a chest x-ray was obtained.  I reviewed the chest x-ray which suggest the possibility of an aspiration pneumonitis or pneumonia.  She will be initiated on antibiotic therapy to try and decrease the risk of aspiration pneumonia developing.  I have updated the patient's husband and the patient about things.  Hopefully this will only keep her in the hospital for short period of time as she also uses the incentive spirometer to try and open up her lungs further.  She will be on a full liquid diet and advance that slowly.  I tried to give as much information as I could to the patient's husband in regards to her clinical status but it is hard for me to outline what probabilities would be for her to worsen or decompensate with the hope that we found this early and that she is getting treated early hopefully she will have a quick recovery.  I am going to hold off on her CT abdomen for now to evaluate the subepithelial lesion versus angulation and we can reconsider that tomorrow.  For today she will get a KUB at bedside.  The inpatient GI team will see her in follow-up tomorrow.  Appreciate the medicine service monitoring her and treating her as able.   Justice Britain, MD [facility] Office # [phone]

## 2022-07-28 NOTE — Progress Notes (Signed)
Pharmacy Antibiotic Note  NOELENE GANG is a 61 y.o. female admitted on 07/27/2022 with hematemesis. Patient is s/p small bowel enteroscopy. Pharmacy has been consulted for Unasyn dosing for aspiration pneumonia.  Plan: Unasyn 3 g IV q6h  Height: '5\' 2"'$  (157.5 cm) Weight: 89.4 kg (197 lb) IBW/kg (Calculated) : 50.1  Temp (24hrs), Avg:98.5 F (36.9 C), Min:98.1 F (36.7 C), Max:99.5 F (37.5 C)  Recent Labs  Lab 07/27/22 0831 07/28/22 0353  WBC 11.0* 7.3  CREATININE 0.68 0.71    Estimated Creatinine Clearance: 75.7 mL/min (by C-G formula based on SCr of 0.71 mg/dL).    Allergies  Allergen Reactions   Tape Itching and Other (See Comments)    Redness     Antimicrobials this admission: Unasyn 11/21 >>  Dose adjustments this admission: NA  Microbiology results: NA  Thank you for allowing pharmacy to be a part of this patient's care.  Tawnya Crook, PharmD, BCPS Clinical Pharmacist 07/28/2022 3:27 PM

## 2022-07-28 NOTE — Op Note (Signed)
[facility] Patient Name: [name] Procedure Date: 07/28/2022 MRN: [id number] Attending MD: Justice Britain , MD, [id number] Date of Birth: Jul 02, 1961 CSN: [id number] Age: 61 Admit Type: Outpatient Procedure:                Small bowel enteroscopy Indications:              Melena, Personal history of upper GI endoscopy,                            Personal history of lower GI endoscopy Providers:                Justice Britain, MD, Jeanella Cara, RN,                            Fransico Setters Mbumina, Technician Referring MD:              Medicines:                Monitored Anesthesia Care Complications:            No immediate complications. Estimated Blood Loss:     Estimated blood loss was minimal. Procedure:                Pre-Anesthesia Assessment:                           - Prior to the procedure, a History and Physical                            was performed, and patient medications and                            allergies were reviewed. The patient's tolerance of                            previous anesthesia was also reviewed. The risks                            and benefits of the procedure and the sedation                            options and risks were discussed with the patient.                            All questions were answered, and informed consent                            was obtained. Prior Anticoagulants: The patient has                            taken no anticoagulant or antiplatelet agents. ASA                            Grade Assessment: III - A patient with severe  systemic disease. After reviewing the risks and                            benefits, the patient was deemed in satisfactory                            condition to undergo the procedure.                           After obtaining informed consent, the endoscope was                            passed under direct vision. Throughout the                             procedure, the patient's blood pressure, pulse, and                            oxygen saturations were monitored continuously. The                            [id number]-HQ190L ([id number]) Olympus colonoscope was                            introduced through the mouth and advanced to the                            proximal jejunum. The small bowel enteroscopy was                            somewhat difficult due to a J-shaped stomach which                            made pyloric intubation difficult. Successful                            completion of the procedure was aided by performing                            the maneuvers documented (below) in this report.                            The patient tolerated the procedure. Scope In: Scope Out: Findings:      No gross lesions were noted in the entire esophagus.      The Z-line was regular and was found 37 cm from the incisors.      A non-obstructing Schatzki ring was found at the gastroesophageal       junction.      A 4 cm hiatal hernia was present.      Multiple small sessile polyps (fundic gland in appearance) with no       bleeding and no stigmata of recent bleeding were found in the gastric       body.      No other gross lesions were noted in the entire examined  stomach.      A single medium-sized possible subepithelial lesion (vs significant       angulation deformity) was found in the third portion/fourth portion of       the duodenum. There was significant difficulty with enteroscope passage       due to looping within the stomach to reach this with the pediatric       colonoscope. There appeared to be a small AVM at this area with small       erosion. Fulguration to ablate the lesion by argon plasma was       successful. For location and additional hemostasis, two hemostatic clips       were successfully placed (MR conditional). Clip manufacturer: [employer]. There was no bleeding during, or at the  end, of the       procedure.      A single angioectasia with no bleeding was found in the fourth portion       of the duodenum. Fulguration to ablate the lesion to prevent bleeding by       argon plasma was successful.      Normal mucosa was found in the visualized proximal jejunum. Area was       tattooed with an injection of Spot (carbon black) to demarcate the       distal extent of today's procedure. Impression:               - No gross lesions in the entire esophagus. Z-line                            regular, 37 cm from the incisors. Non-obstructing                            Schatzki ring.                           - 4 cm hiatal hernia.                           - Multiple gastric polyps - fundic gland in                            appearance.                           - No other gross lesions in the entire stomach.                           - Subepithelial duodenal medium-sized nodule (vs                            significant angulation deformity) with a AVM and                            small erosion noted. Difficult to get positioning                            with the pediatric colonoscope due to looping  within the stomach to this area. Treated with argon                            plasma coagulation (APC), then 2 clips (MR                            conditional) were placed for demarcation                            purposes/location purposes.                           - A single non-bleeding angioectasia in the                            duodenum 4 region. Treated with argon plasma                            coagulation (APC).                           - Normal mucosa was found in the visualized                            proximal jejunum. Tattooed distal extent of today's                            procedure. Moderate Sedation:      Not Applicable - Patient had care per Anesthesia. Recommendation:           - The patient will be observed  post-procedure,                            until all discharge criteria are met.                           - Return patient to hospital ward for ongoing care.                           - Full liquid diet.                           - Recommend a CT-Abdomen with PO/IV Contrast to                            define this potential subepithelial lesion further.                           - Continue IV PPI BID x 24 hours and then                            transition to PO PPI BID.                           - Carafate twice daily for 2-weeks.                           -  Trend Hgb/Hct.                           - Give IV Iron x 1 dose while inhouse.                           - If patient develops persistent blood loss anemia                            with transfusion requirement, then will require                            consideration of colonoscopy vs CT-GI bleeding scan                            vs Tagged RBC scan (pending type of bleeding that                            is noted).                           - The findings and recommendations were discussed                            with the patient.                           - The findings and recommendations were discussed                            with the patient's family.                           - The findings and recommendations were discussed                            with the referring physician. Procedure Code(s):        --- Professional ---                           [phone], Small intestinal endoscopy, enteroscopy                            beyond second portion of duodenum, not including                            ileum; with ablation of tumor(s), polyp(s), or                            other lesion(s) not amenable to removal by hot                            biopsy forceps, bipolar cautery or snare technique  44799, Unlisted procedure, small intestine Diagnosis Code(s):        --- Professional ---                            K22.2, Esophageal obstruction                           K44.9, Diaphragmatic hernia without obstruction or                            gangrene                           K31.7, Polyp of stomach and duodenum                           K31.89, Other diseases of stomach and duodenum                           K31.819, Angiodysplasia of stomach and duodenum                            without bleeding                           K92.1, Melena (includes Hematochezia)                           Z98.890, Other specified postprocedural states CPT copyright 2022 American Medical Association. All rights reserved. The codes documented in this report are preliminary and upon coder review may  be revised to meet current compliance requirements. Justice Britain, MD 07/28/2022 1:46:44 PM Number of Addenda: 0

## 2022-07-28 NOTE — Progress Notes (Signed)
  Progress Note   Patient: [name] PFX:[id number] DOB: June 17, 1961 DOA: 07/27/2022     1 DOS: the patient was seen and examined on 07/28/2022   Brief hospital course: 61 y.o. female with medical history significant of osteoarthritis, cancer, DDD of lumbosacral spine, GERD, avascular necrosis of the right hip, history of mitral valve prolapse and repair, history of vulvar dysplasia, iron deficiency anemia due to chronic blood loss class I obesity who is coming to the emergency department with a history of waking up this morning having nausea followed by 3 episodes of hematemesis with bright red blood in clots.  She is not sure if she has had melena because she takes iron supplement.  No EtOH use, no OTC or prescription NSAID use, no spicy foods, she drinks a couple coffee daily in the mornings.  She denied fever, chills, rhinorrhea, sore throat, wheezing or hemoptysis.  No chest pain, palpitations, diaphoresis, PND, orthopnea or pitting edema of the lower extremities.  No flank pain, dysuria, frequency or hematuria.  No polyuria, polydipsia, polyphagia or blurred vision.   Assessment and Plan:   UGI bleed -concerns of UGI bleed with hematemesis on presentation -Hgb trending down from 12.8 to 10.8 this AM -GI consulted and pt is now s/p EGD with findings of multiple gastric polyps, subepithelial duodenal nodule with AVM and small erosion treated with APC, non-bleeding angioectasia in duodenum treated with APC -during procedure, concern for possible aspiration, see below -Will recheck CBC in AM -Continued on PPI   Active Problems:   GERD (gastroesophageal reflux disease) -continued on PPI per above     Paroxysmal atrial fibrillation (HCC) Anticoagulation contraindicated at this time Continue Cardizem 180 mg p.o. daily. Rate controlled     Class 1 obesity Current BMI 34.57 kg/m. Will need lifestyle modifications. Follow-up with PCP and/or bariatric medicine.  Aspiration -contacted  by anesthesiology about n/v during procedure today with concerns for aspiration -Pt needing up to [facility] noted while in PACU -Ordered and reviewed CXR. Findings of increased intersitial markings in L lower lung fields -Have started empiric unasyn for now -recheck CBC in AM -wean O2 as tolerated      Subjective: Seen prior to EGD today. Pt hoping to go home soon  Physical Exam: Vitals:   07/28/22 1510 07/28/22 1520 07/28/22 1530 07/28/22 1540  BP: (!) 113/54 130/65 (!) 120/55 123/67  Pulse: 83 86 88 98  Resp: (!) '31 16 19 20  '$ Temp:      TempSrc:      SpO2: 91% 93% 91% 93%  Weight:      Height:       General exam: Awake, laying in bed, in nad Respiratory system: Normal respiratory effort, no wheezing Cardiovascular system: regular rate, s1, s2 Gastrointestinal system: Soft, nondistended, positive BS Central nervous system: CN2-12 grossly intact, strength intact Extremities: Perfused, no clubbing Skin: Normal skin turgor, no notable skin lesions seen Psychiatry: Mood normal // no visual hallucinations   Data Reviewed:  Labs reviewed: na 138, K 4.1, Cr 0.71, Hgb 10.8  Family Communication: Pt in room, family at bedside  Disposition: Status is: Inpatient Remains inpatient appropriate because: Severity of illness  Planned Discharge Destination: Home    Author: Marylu Lund, MD 07/28/2022 4:15 PM  For on call review [email or web address].

## 2022-07-28 NOTE — Interval H&P Note (Signed)
History and Physical Interval Note:  07/28/2022 12:07 PM  [name]  has presented today for surgery, with the diagnosis of hematemesis.  The various methods of treatment have been discussed with the patient and family. After consideration of risks, benefits and other options for treatment, the patient has consented to  Procedure(s): ENTEROSCOPY (N/A) as a surgical intervention.  The patient's history has been reviewed, patient examined, no change in status, stable for surgery.  I have reviewed the patient's chart and labs.  Questions were answered to the patient's satisfaction.     [employer]

## 2022-07-28 NOTE — Transfer of Care (Signed)
Immediate Anesthesia Transfer of Care Note  Patient: [name]  Procedure(s) Performed: ENTEROSCOPY SUBMUCOSAL TATTOO INJECTION HOT HEMOSTASIS (ARGON PLASMA COAGULATION/BICAP) HEMOSTASIS CLIP PLACEMENT  Patient Location: PACU  Anesthesia Type:MAC  Level of Consciousness: awake, alert , and oriented  Airway & Oxygen Therapy: Patient Spontanous Breathing and Patient connected to face mask oxygen  Post-op Assessment: Report given to RN and Post -op Vital signs reviewed and stable  Post vital signs: Reviewed and stable  Last Vitals:  Vitals Value Taken Time  BP 109/54 07/28/22 1340  Temp    Pulse 86 07/28/22 1342  Resp 21 07/28/22 1342  SpO2 100 % 07/28/22 1342  Vitals shown include unvalidated device data.  Last Pain:  Vitals:   07/28/22 1134  TempSrc: Temporal  PainSc: 0-No pain         Complications: No notable events documented.

## 2022-07-28 NOTE — TOC Initial Note (Signed)
Transition of Care [facility]) - Initial/Assessment Note    Patient Details  Name: [name] MRN: [id number] Date of Birth: 1961/08/06  Transition of Care [facility]) CM/SW Contact:    Leeroy Cha, RN Phone Number: 07/28/2022, 9:17 AM  Clinical Narrative:                   Transition of Care [facility]) Screening Note   Patient Details  Name: [name] Date of Birth: 04-30-1961   Transition of Care [facility]) CM/SW Contact:    Leeroy Cha, RN Phone Number: 07/28/2022, 9:17 AM    Transition of Care Department [facility]) has reviewed patient and no TOC needs have been identified at this time. We will continue to monitor patient advancement through interdisciplinary progression rounds. If new patient transition needs arise, please place a TOC consult.   Expected Discharge Plan: Home/Self Care Barriers to Discharge: Continued Medical Work up   Patient Goals and CMS Choice Patient states their goals for this hospitalization and ongoing recovery are:: to go home CMS Medicare.gov Compare Post Acute Care list provided to:: Patient Choice offered to / list presented to : Patient  Expected Discharge Plan and Services Expected Discharge Plan: Home/Self Care   Discharge Planning Services: CM Consult   Living arrangements for the past 2 months: Single Family Home                                      Prior Living Arrangements/Services Living arrangements for the past 2 months: Single Family Home Lives with:: Spouse Patient language and need for interpreter reviewed:: Yes Do you feel safe going back to the place where you live?: Yes            Criminal Activity/Legal Involvement Pertinent to Current Situation/Hospitalization: No - Comment as needed  Activities of Daily Living Home Assistive Devices/Equipment: Eyeglasses (driving glasses) ADL Screening (condition at time of admission) Patient's cognitive ability adequate to safely complete daily activities?:  Yes Is the patient deaf or have difficulty hearing?: No Does the patient have difficulty seeing, even when wearing glasses/contacts?: No Does the patient have difficulty concentrating, remembering, or making decisions?: No Patient able to express need for assistance with ADLs?: Yes Does the patient have difficulty dressing or bathing?: No Independently performs ADLs?: Yes (appropriate for developmental age) Does the patient have difficulty walking or climbing stairs?: No Weakness of Legs: Both Weakness of Arms/Hands: Both  Permission Sought/Granted                  Emotional Assessment Appearance:: Appears stated age Attitude/Demeanor/Rapport: Engaged Affect (typically observed): Calm Orientation: : Oriented to Self, Oriented to Place, Oriented to  Time, Oriented to Situation Alcohol / Substance Use: Tobacco Use, Alcohol Use (former smoker, current occasional etoh intake.) Psych Involvement: No (comment)  Admission diagnosis:  UGI bleed [K92.2] Hematemesis with nausea [K92.0] Patient Active Problem List   Diagnosis Date Noted   UGI bleed 07/27/2022   Paroxysmal atrial fibrillation ([place]) 07/27/2022   Class 1 obesity 07/27/2022   Iron deficiency anemia due to chronic blood loss 05/05/2022   Normocytic anemia 05/04/2022   H/O mitral valve repair    Arrhythmia 08/27/2020   Spinal stenosis 10/22/2016   Hip pain 11/29/2012   Lumbago 11/29/2012   Osteoarthritis 05/10/2012   S/P cholecystectomy 05/10/2012   GERD (gastroesophageal reflux disease) 05/10/2012   PCP:  Redmond School, MD Pharmacy:  [id number] - [place], [address] [place] [address] Phone: [phone] Fax: [phone]     Social Determinants of Health (SDOH) Interventions    Readmission Risk Interventions   No data to display

## 2022-07-28 NOTE — Anesthesia Preprocedure Evaluation (Addendum)
Anesthesia Evaluation  Patient identified by MRN, date of birth, ID band Patient awake    Reviewed: Allergy & Precautions, NPO status , Patient's Chart, lab work & pertinent test results  Airway Mallampati: III  TM Distance: >3 FB Neck ROM: Full    Dental  (+) Dental Advisory Given, Teeth Intact   Pulmonary former smoker   Pulmonary exam normal breath sounds clear to auscultation       Cardiovascular negative cardio ROS  Rhythm:Irregular Rate:Tachycardia + Systolic murmurs Echo 3875  1. Left ventricular ejection fraction, by estimation, is 60 to 65%. The left ventricle has normal function. The left ventricle has no regional wall motion abnormalities. Left ventricular diastolic function could not be evaluated.   2. Right ventricular systolic function is normal. The right ventricular size is normal. There is normal pulmonary artery systolic pressure. The estimated right ventricular systolic pressure is 64.3 mmHg.   3. Left atrial size was mildly dilated.   4. A small pericardial effusion is present. The pericardial effusion is posterior to the left ventricle. There is no evidence of cardiac tamponade.   5. Hx of MV repair. Either an annuloplasty ring is present or mild MAC is present. Unclear surgical details, but does not have typical appearance of annuloplasty ring. The mitral valve has been repaired/replaced. Mild mitral valve regurgitation. No evidence of mitral stenosis.   6. The aortic valve is tricuspid. There is mild calcification of the aortic valve. There is mild thickening of the aortic valve. Aortic valve regurgitation is not visualized. Mild aortic valve sclerosis is present, with no evidence of aortic valve stenosis.   7. The inferior vena cava is normal in size with greater than 50% respiratory variability, suggesting right atrial pressure of 3 mmHg.      Neuro/Psych negative neurological ROS  negative psych ROS    GI/Hepatic Neg liver ROS,GERD  ,,  Endo/Other  negative endocrine ROS    Renal/GU negative Renal ROS     Musculoskeletal  (+) Arthritis ,    Abdominal  (+) + obese  Peds  Hematology  (+) Blood dyscrasia, anemia   Anesthesia Other Findings   Reproductive/Obstetrics                             Anesthesia Physical Anesthesia Plan  ASA: 3  Anesthesia Plan: MAC   Post-op Pain Management: Minimal or no pain anticipated   Induction: Intravenous  PONV Risk Score and Plan: 4 or greater and Ondansetron, Dexamethasone, Midazolam and Scopolamine patch - Pre-op  Airway Management Planned: Natural Airway  Additional Equipment: None  Intra-op Plan:   Post-operative Plan:   Informed Consent: I have reviewed the patients History and Physical, chart, labs and discussed the procedure including the risks, benefits and alternatives for the proposed anesthesia with the patient or authorized representative who has indicated his/her understanding and acceptance.     Dental advisory given  Plan Discussed with: CRNA  Anesthesia Plan Comments: (Pt denies CP, SOB, or peripheral edema. )        Anesthesia Quick Evaluation

## 2022-07-28 NOTE — Hospital Course (Signed)
61 y.o. female with medical history significant of osteoarthritis, cancer, DDD of lumbosacral spine, GERD, avascular necrosis of the right hip, history of mitral valve prolapse and repair, history of vulvar dysplasia, iron deficiency anemia due to chronic blood loss class I obesity who is coming to the emergency department with a history of waking up this morning having nausea followed by 3 episodes of hematemesis with bright red blood in clots.  She is not sure if she has had melena because she takes iron supplement.  No EtOH use, no OTC or prescription NSAID use, no spicy foods, she drinks a couple coffee daily in the mornings.  She denied fever, chills, rhinorrhea, sore throat, wheezing or hemoptysis.  No chest pain, palpitations, diaphoresis, PND, orthopnea or pitting edema of the lower extremities.  No flank pain, dysuria, frequency or hematuria.  No polyuria, polydipsia, polyphagia or blurred vision.

## 2022-07-29 ENCOUNTER — Inpatient Hospital Stay (HOSPITAL_COMMUNITY): Payer: PRIVATE HEALTH INSURANCE

## 2022-07-29 DIAGNOSIS — K922 Gastrointestinal hemorrhage, unspecified: Secondary | ICD-10-CM | POA: Diagnosis not present

## 2022-07-29 DIAGNOSIS — K5520 Angiodysplasia of colon without hemorrhage: Secondary | ICD-10-CM | POA: Diagnosis not present

## 2022-07-29 DIAGNOSIS — K921 Melena: Secondary | ICD-10-CM

## 2022-07-29 DIAGNOSIS — K920 Hematemesis: Secondary | ICD-10-CM | POA: Diagnosis not present

## 2022-07-29 LAB — CBC
HCT: 32.9 % — ABNORMAL LOW (ref 36.0–46.0)
Hemoglobin: 10.6 g/dL — ABNORMAL LOW (ref 12.0–15.0)
MCH: 30.6 pg (ref 26.0–34.0)
MCHC: 32.2 g/dL (ref 30.0–36.0)
MCV: 95.1 fL (ref 80.0–100.0)
Platelets: 253 10*3/uL (ref 150–400)
RBC: 3.46 MIL/uL — ABNORMAL LOW (ref 3.87–5.11)
RDW: 14.8 % (ref 11.5–15.5)
WBC: 15.6 10*3/uL — ABNORMAL HIGH (ref 4.0–10.5)
nRBC: 0 % (ref 0.0–0.2)

## 2022-07-29 LAB — COMPREHENSIVE METABOLIC PANEL
ALT: 29 U/L (ref 0–44)
AST: 21 U/L (ref 15–41)
Albumin: 2.8 g/dL — ABNORMAL LOW (ref 3.5–5.0)
Alkaline Phosphatase: 43 U/L (ref 38–126)
Anion gap: 6 (ref 5–15)
BUN: 11 mg/dL (ref 8–23)
CO2: 30 mmol/L (ref 22–32)
Calcium: 8.2 mg/dL — ABNORMAL LOW (ref 8.9–10.3)
Chloride: 102 mmol/L (ref 98–111)
Creatinine, Ser: 0.73 mg/dL (ref 0.44–1.00)
GFR, Estimated: >60 mL/min (ref >60)
Glucose, Bld: 105 mg/dL — ABNORMAL HIGH (ref 70–99)
Potassium: 3.6 mmol/L (ref 3.5–5.1)
Sodium: 138 mmol/L (ref 135–145)
Total Bilirubin: 1.5 mg/dL — ABNORMAL HIGH (ref 0.3–1.2)
Total Protein: 5.2 g/dL — ABNORMAL LOW (ref 6.5–8.1)

## 2022-07-29 MED ORDER — IOHEXOL 9 MG/ML PO SOLN
ORAL | Status: AC
  Filled 2022-07-29: qty 1000

## 2022-07-29 MED ORDER — IOHEXOL 9 MG/ML PO SOLN: 1000.0000 mL | ORAL | Status: DC

## 2022-07-29 MED ORDER — IOHEXOL 300 MG/ML  SOLN
100.0000 mL | Freq: Once | INTRAMUSCULAR | Status: AC | PRN
  Administered 2022-07-29: 100 mL via INTRAVENOUS

## 2022-07-29 MED ORDER — SODIUM CHLORIDE 0.9 % IV SOLN
125.0000 mg | Freq: Once | INTRAVENOUS | Status: AC
  Administered 2022-07-29: 125 mg via INTRAVENOUS
  Filled 2022-07-29: qty 10

## 2022-07-29 MED ORDER — IOHEXOL 9 MG/ML PO SOLN
500.0000 mL | ORAL | Status: AC
  Administered 2022-07-29 (×2): 500 mL via ORAL

## 2022-07-29 MED ORDER — AMOXICILLIN-POT CLAVULANATE 875-125 MG PO TABS: 1.0000 | ORAL_TABLET | Freq: Two times a day (BID) | ORAL | 0 refills | Status: DC

## 2022-07-29 MED ORDER — PANTOPRAZOLE SODIUM 40 MG PO TBEC: 40.0000 mg | DELAYED_RELEASE_TABLET | Freq: Two times a day (BID) | ORAL | 3 refills | Status: AC

## 2022-07-29 MED ORDER — SUCRALFATE 1 G PO TABS: 1.0000 g | ORAL_TABLET | Freq: Two times a day (BID) | ORAL | 0 refills | Status: AC

## 2022-07-29 NOTE — Anesthesia Postprocedure Evaluation (Signed)
Anesthesia Post Note  Patient: [name]  Procedure(s) Performed: ENTEROSCOPY SUBMUCOSAL TATTOO INJECTION HOT HEMOSTASIS (ARGON PLASMA COAGULATION/BICAP) HEMOSTASIS CLIP PLACEMENT     Patient location during evaluation: PACU Anesthesia Type: MAC Level of consciousness: awake and alert Pain management: pain level controlled Vital Signs Assessment: post-procedure vital signs reviewed and stable Respiratory status: spontaneous breathing Cardiovascular status: stable Anesthetic complications: yes Comments: Presumed aspiration during procedure. I was informed after pt was in recovery with new supplemental O2 requirement. Discussed with primary team. O2 weaned as able. Also some PONV.    No notable events documented.  Last Vitals:  Vitals:   07/28/22 2237 07/29/22 0528  BP: (!) 114/55 (!) 90/56  Pulse: 100 96  Resp: 16 16  Temp: 37.3 C 37 C  SpO2: 96% 94%    Last Pain:  Vitals:   07/29/22 0528  TempSrc: Oral  PainSc:                  Nolon Nations

## 2022-07-29 NOTE — Discharge Summary (Signed)
Physician Discharge Summary   Patient: [name] MRN: [id number] DOB: 02-Jun-1961  Admit date:     07/27/2022  Discharge date: 07/29/22  Discharge Physician: Edwin Dada   PCP: Redmond School, MD     Recommendations at discharge:  Follow up with PCP in 1 week Follow up with GI Dr. Henrene Pastor or Dr. Rush Landmark as directed Continue sucralfate for 2 weeks, BID pantoprazole until GI follow up Note: 2nd episode of aspiration for this patient during moderate sedation     Discharge Diagnoses: Principal Problem:   UGI bleed due to AVM Active Problems:   GERD (gastroesophageal reflux disease)   Paroxysmal atrial fibrillation (HCC)   Class 1 obesity     Hospital Course: [name] is a 61 year old F with history chronic iron deficiency anemia on iron infusions, Afib not on AC, history of VSD status post open heart surgery at age 82, obesity, and skin cancer who presented with nausea and hematemesis.  No OTC or prescription NSAID use.  Not on anticoagulation     Acute upper GI bleed due to AVM Admitted and placed on IV pantoprazole.  GI consulted.  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  Hgb remained stable, and she was discharged on sucralfate for 2 weeks as well as twice daily pantoprazole indefinitely and GI follow-up.    Aspiration pneumonitis Patient with nausea and vomiting during procedure, requiring up to 4 L nasal cannula in the PACU.  Instructed rapidly, and although chest imaging showed some new infiltrates, she had no fever, felt "completely better" by the morning and I suspect this was an aspiration pneumonitis not pneumonia.  Nonetheless, out of caution she was prescribed Augmentin with the "pill in the pocket" approach, which she will take if she develops symptoms of pneumonia.    Paroxysmal atrial  fibrillation Rate controlled.  Not on AC, CHA2DS2-VASc Score = 1 (gender).   Obesity BMI 36             The [facility] Controlled Substances Registry was reviewed for this patient prior to discharge.  Consultants: GI, Dr. Rush Landmark Procedures performed: EGD  Disposition: Home   DISCHARGE MEDICATION: Allergies as of 07/29/2022       Reactions   Tape Itching, Other (See Comments)   Redness         Medication List     TAKE these medications    acetaminophen 650 MG CR tablet Commonly known as: TYLENOL Take 1,300 mg by mouth 2 (two) times daily as needed for pain.   amoxicillin-clavulanate 875-125 MG tablet Commonly known as: AUGMENTIN Take 1 tablet by mouth 2 (two) times daily.   COLLAGEN 1500/C PO Take 1 capsule by mouth daily.   diltiazem 180 MG 24 hr capsule Commonly known as: CARDIZEM CD Take 1 capsule (180 mg total) by mouth daily.   estrogens-methylTEST 1.25-2.5 MG Tabs tablet Take 1 tablet by mouth daily.   ferrous sulfate 325 (65 FE) MG EC tablet Take 1 tablet (325 mg total) by mouth 2 (two) times daily. What changed: when to take this   HYDROcodone-acetaminophen 7.5-325 MG tablet Commonly known as: NORCO Take 1 tablet by mouth daily as needed for moderate pain.   medroxyPROGESTERone 5 MG tablet Commonly known as: PROVERA Take 5 mg by mouth daily.   OVER THE COUNTER MEDICATION Take 2 capsules by mouth daily. Zinc, Vitamin  C, and Vitamin D   pantoprazole 40 MG tablet Commonly known as: PROTONIX Take 1 tablet (40 mg total) by mouth 2 (two) times daily. What changed: when to take this   sucralfate 1 g tablet Commonly known as: Carafate Take 1 tablet (1 g total) by mouth 2 (two) times daily for 14 days.   traMADol 50 MG tablet Commonly known as: ULTRAM Take 100 mg by mouth 2 (two) times daily.        Follow-up Information     Redmond School, MD. Schedule an appointment as soon as possible for a visit in 1 week(s).    Specialty: Internal Medicine Contact information: [address] [place] [address] [phone]         [name], MD Follow up.   Specialty: Gastroenterology Contact information: [address] [place] [address] [phone]                 Discharge Instructions     Discharge instructions   Complete by: As directed    **IMPORTANT DISCHARGE INSTRUCTIONS**   From Dr. Loleta Books: You were admitted for GI bleeding. You had an endoscopy that showed an AVM (arteriovenous malformation) as the likely cause of the bleeding  For the next 2 weeks: Take sucrafate 1g twice daily  For now: Take pantoprazole 40 mg twice daily  Follow up with Dr. Henrene Pastor, Dr. Rush Landmark and [facility] as directed   For the aspiration: I do not think you have a pneumonia, although we could see aspirate in the bottom of the left lung on the CT of your abdomen today. For that reason: use the incentive spirometer and flutter valve frequently this week to prevent a pneumonia  I have sent an antibiotic to your pharmacy. Do not take this for now.  HOWEVER, If you start to have fever, cough or shortness of breath, you should pick up and take the antibiotic Augmentin twice daily for 5 days   Increase activity slowly   Complete by: As directed        Discharge Exam: Filed Weights   07/27/22 0756 07/28/22 1134  Weight: 85.7 kg 89.4 kg    General: Pt is alert, awake, not in acute distress Cardiovascular: RRR, nl S1-S2, no murmurs appreciated.   No LE edema.   Respiratory: Normal respiratory rate and rhythm.  CTAB without rales or wheezes. Abdominal: Abdomen soft and non-tender.  No distension or HSM.   Neuro/Psych: Strength symmetric in upper and lower extremities.  Judgment and insight appear normal.   Condition at discharge: good  The results of significant diagnostics from this hospitalization (including imaging, microbiology, ancillary and laboratory) are listed  below for reference.   Imaging Studies: DG Chest 2 View  Result Date: 07/29/2022 CLINICAL DATA:  Hypoxemia. EXAM: CHEST - 2 VIEW COMPARISON:  07/28/2022 FINDINGS: The cardio pericardial silhouette is enlarged. Patchy airspace disease in the parahilar left lung is new in the interval. Right lung clear. No evidence for pleural effusion. The visualized bony structures of the thorax are unremarkable. Telemetry leads overlie the chest. IMPRESSION: Patchy airspace disease in the parahilar left lung compatible with pneumonia. Follow-up recommended to ensure resolution. Electronically Signed   By: Misty Stanley M.D.   On: 07/29/2022 12:54   CT ABDOMEN PELVIS W CONTRAST  Result Date: 07/29/2022 CLINICAL DATA:  Recent upper endoscopy demonstrated potential sub epithelial lesion in the distal duodenum. EXAM: CT ABDOMEN AND PELVIS WITH CONTRAST TECHNIQUE: Multidetector CT imaging of the abdomen and pelvis  was performed using the standard protocol following bolus administration of intravenous contrast. RADIATION DOSE REDUCTION: This exam was performed according to the departmental dose-optimization program which includes automated exposure control, adjustment of the mA and/or kV according to patient size and/or use of iterative reconstruction technique. CONTRAST:  158m OMNIPAQUE IOHEXOL 300 MG/ML  SOLN COMPARISON:  None Available. FINDINGS: Lower chest: Dense consolidation in the medial LEFT lower lobe. Hepatobiliary: No focal hepatic lesion. Postcholecystectomy. No biliary dilatation. Pancreas: Pancreas is normal. No ductal dilatation. No pancreatic inflammation. Spleen: Normal spleen Adrenals/urinary tract: Adrenal glands and kidneys are normal. The ureters and bladder normal. Stomach/Bowel: Vascular clips noted in the third portion the duodenum (image 39/2). No associated small-bowel lesion. Small bowel is normal. Cecum normal. Several diverticula of the LEFT colon without acute inflammation. Vascular/Lymphatic:  Abdominal aorta is normal caliber with atherosclerotic calcification. There is no retroperitoneal or periportal lymphadenopathy. No pelvic lymphadenopathy. Repair of the aorta. Reproductive: Uterus and adnexa unremarkable. Other: Is Musculoskeletal: No aggressive osseous lesion. IMPRESSION: 1. No lesion identified in the duodenum at the site the vascular clips (third portion duodenum). 2. No small-bowel lesion. 3. Mild LEFT colon diverticulosis. 4. LEFT lower lobe PNEUMONIA. Electronically Signed   By: SSuzy BouchardM.D.   On: 07/29/2022 12:52   DG Abd Portable 1V  Result Date: 07/28/2022 CLINICAL DATA:  Duodenal nodule. Clips were placed to identify area of possible recent bleeding. EXAM: PORTABLE ABDOMEN - 1 VIEW COMPARISON:  Abdominal radiograph 05/06/2022 FINDINGS: There is a clip in the midline overlying the L4 vertebral body, this may be the referenced duodenal clip. Multiple surgical clips in the right upper and lower quadrants were present on prior exam. L2-L3 fusion hardware with interbody spacers. No bowel dilatation to suggest obstruction. No significant formed stool in the colon. IMPRESSION: A clip is seen in the midline over the L4 vertebral body, this may be the referenced duodenal clip. Electronically Signed   By: MKeith RakeM.D.   On: 07/28/2022 19:06   DG CHEST PORT 1 VIEW  Result Date: 07/28/2022 CLINICAL DATA:  Evaluate for aspiration pneumonia, postoperative state EXAM: PORTABLE CHEST 1 VIEW COMPARISON:  12/05/2019 FINDINGS: Transverse diameter of heart is increased. Subtle increased markings are seen in the left lower lung field. There is a interval clearing of extensive patchy infiltrates in both lungs. There is no pleural effusion or pneumothorax. There is a metallic structure in the left superior mediastinum with no significant change, possibly residual change from previous vascular intervention. There is surgical fusion in cervical spine. IMPRESSION: Small patchy focus of  increased interstitial markings in left lower lung fields may suggest scarring or interstitial pneumonia. Electronically Signed   By: PElmer PickerM.D.   On: 07/28/2022 15:18   MM 3D SCREEN BREAST BILATERAL  Result Date: 07/15/2022 CLINICAL DATA:  Screening. EXAM: DIGITAL SCREENING BILATERAL MAMMOGRAM WITH TOMOSYNTHESIS AND CAD TECHNIQUE: Bilateral screening digital craniocaudal and mediolateral oblique mammograms were obtained. Bilateral screening digital breast tomosynthesis was performed. The images were evaluated with computer-aided detection. COMPARISON:  Previous exam(s). ACR Breast Density Category b: There are scattered areas of fibroglandular density. FINDINGS: There are no findings suspicious for malignancy. IMPRESSION: No mammographic evidence of malignancy. A result letter of this screening mammogram will be mailed directly to the patient. RECOMMENDATION: Screening mammogram in one year. (Code:SM-B-01Y) BI-RADS CATEGORY  1: Negative. Electronically Signed   By: SValentino SaxonM.D.   On: 07/15/2022 08:22    Microbiology: Results for orders placed or performed during the hospital  encounter of 06/15/22  Urine Culture     Status: Abnormal   Collection Time: 06/15/22  5:40 PM   Specimen: Urine, Clean Catch  Result Value Ref Range Status   Specimen Description   Final    URINE, CLEAN CATCH Performed at [facility], [address] [place], [place] [address]    Special Requests   Final    NONE Performed at [facility], [address] [place], [place] [address]    Culture >=100,000 COLONIES/mL ESCHERICHIA COLI (A)  Final   Report Status 06/18/2022 FINAL  Final   Organism ID, Bacteria ESCHERICHIA COLI (A)  Final      Susceptibility   Escherichia coli - MIC*    AMPICILLIN >=32 RESISTANT Resistant     CEFAZOLIN 16 SENSITIVE Sensitive     CEFEPIME <=0.12 SENSITIVE Sensitive     CEFTRIAXONE <=0.25 SENSITIVE Sensitive     CIPROFLOXACIN <=0.25 SENSITIVE Sensitive     GENTAMICIN  <=1 SENSITIVE Sensitive     IMIPENEM <=0.25 SENSITIVE Sensitive     NITROFURANTOIN <=16 SENSITIVE Sensitive     TRIMETH/SULFA <=20 SENSITIVE Sensitive     AMPICILLIN/SULBACTAM >=32 RESISTANT Resistant     PIP/TAZO <=4 SENSITIVE Sensitive     * >=100,000 COLONIES/mL ESCHERICHIA COLI    Labs: CBC: Recent Labs  Lab 07/27/22 0831 07/27/22 2103 07/28/22 0353 07/29/22 0356  WBC 11.0*  --  7.3 15.6*  NEUTROABS 8.4*  --   --   --   HGB 12.8 11.2* 10.8* 10.6*  HCT 39.9 34.4* 33.7* 32.9*  MCV 94.5  --  94.9 95.1  PLT 303  --  258 431   Basic Metabolic Panel: Recent Labs  Lab 07/27/22 0831 07/28/22 0353 07/29/22 0356  NA 142 138 138  K 4.0 4.1 3.6  CL 108 105 102  CO2 '28 28 30  '$ GLUCOSE 114* 96 105*  BUN '19 12 11  '$ CREATININE 0.68 0.71 0.73  CALCIUM 8.8* 8.5* 8.2*   Liver Function Tests: Recent Labs  Lab 07/27/22 0831 07/28/22 0353 07/29/22 0356  AST '22 18 21  '$ ALT 35 27 29  ALKPHOS 49 41 43  BILITOT 0.6 1.0 1.5*  PROT 6.4* 5.4* 5.2*  ALBUMIN 3.4* 3.0* 2.8*   CBG: No results for input(s): "GLUCAP" in the last 168 hours.  Discharge time spent: approximately 35 minutes spent on discharge counseling, evaluation of patient on day of discharge, and coordination of discharge planning with nursing, social work, pharmacy and case management  Signed: Edwin Dada, MD [facility] 07/29/2022

## 2022-07-29 NOTE — TOC Transition Note (Signed)
Transition of Care [facility]) - CM/SW Discharge Note   Patient Details  Name: [name] MRN: [id number] Date of Birth: Jan 24, 1961  Transition of [facility]) CM/SW Contact:  Leeroy Cha, RN Phone Number: 07/29/2022, 1:42 PM   Clinical Narrative:    Patient dcd to return home with self care.   Final next level of care: Home/Self Care Barriers to Discharge: Barriers Resolved   Patient Goals and CMS Choice Patient states their goals for this hospitalization and ongoing recovery are:: to go home CMS Medicare.gov Compare Post Acute Care list provided to:: Patient Choice offered to / list presented to : Patient  Discharge Placement                       Discharge Plan and Services   Discharge Planning Services: CM Consult                                 Social Determinants of Health (SDOH) Interventions     Readmission Risk Interventions   No data to display

## 2022-07-29 NOTE — Progress Notes (Signed)
PT demonstrated hands on understanding of Flutter device. 

## 2022-07-29 NOTE — Plan of Care (Signed)
  Problem: Clinical Measurements: Goal: Respiratory complications will improve Outcome: Adequate for Discharge   Problem: Activity: Goal: Risk for activity intolerance will decrease Outcome: Adequate for Discharge   Problem: Clinical Measurements: Goal: Cardiovascular complication will be avoided Outcome: Adequate for Discharge   Problem: Clinical Measurements: Goal: Ability to maintain clinical measurements within normal limits will improve Outcome: Adequate for Discharge   Problem: Elimination: Goal: Will not experience complications related to bowel motility Outcome: Adequate for Discharge   Problem: Pain Managment: Goal: General experience of comfort will improve Outcome: Adequate for Discharge   Problem: Safety: Goal: Ability to remain free from injury will improve Outcome: Adequate for Discharge   Problem: Skin Integrity: Goal: Risk for impaired skin integrity will decrease Outcome: Adequate for Discharge

## 2022-07-29 NOTE — Progress Notes (Signed)
Daily Progress Note  Hospital Day: 3  Chief Complaint: hematemesis  Brief History 61 yo female with a pmh not limited to GERD, esophageal stricture, moderate size hiatal hernia  colon polyps, iron deficiency anemia , small bowel AVMs , diverticulosis , chronic atrial fibrillation ( not anticoagulated). Seen in consultation by [place] on 11/20 for hematemesis  Assessment:  #61 year old female with upper GI bleed ( bloody emesis x 4 ).  EGD 11/21 >>A single non-bleeding angioectasia in the duodenum treated with APC and a subepithelial duodenal medium-sized nodule (vs significant angulation deformity) with a AVM and small erosion noted, treated APC then 2 clips were placed for demarcation purposes/location purposes. No further bleeding hematemesis. Did have some maroon stool yesterday but no BMs / bleeding today .Hgb has stabilized.   # Subepithelial duodenal medium-sized nodule(vs significant angulation deformity) on enteroscopy . CT scan today did not show a lesion in duodenal   # Aspiration PNA. CXR >> LLL PNA .Treated with Unasyn. Being discharged home with Amoxicillin. Normal 02 sats. Not coughing. No SHOB   # Acute on chronic anemia secondary to above. Baseline hgb 15.4 >> Hgb 12.8 on admission >> 10.6 today. Followed by Hematology for IDA. Takes daily oral iron. Got dose of IV iron today   #Chronic constipation exacerbated by oral iron   # History of multiple colon polyps, surveillance colonoscopy due December 2027   #Chronic atrial fibrillation, not anticoagulated.      Plan:    Two colace at bedtime, trial of daily Benefiber as outpatient  Advised to contact our office for any recurrent symptoms   Subjective   Feels okay. No bleeding / BMs today. Breathing okay   Objective   Endoscopic studies:  Small bowel endoscopy 11/21 -No gross lesions in the entire esophagus. Z-line regular, 37 cm from the incisors. Nonobstructing Schatzki ring. - 4 cm hiatal hernia. -  Multiple gastric polyps - fundic gland in appearance. - No other gross lesions in the entire stomach. - Subepithelial duodenal medium-sized nodule (vs significant angulation deformity) with a AVM and small erosion noted. Difficult to get positioning with the pediatric colonoscope due to looping within the stomach to this area. Treated with argon plasma coagulation (APC), then 2 clips (MR conditional) were placed for demarcation purposes/location purposes. - A single non-bleeding angioectasia in the duodenum 4 region. Treated with argon plasma coagulation (APC). - Normal mucosa was found in the visualized proximal jejunum. Tattooed distal extent of today's procedure.   Imaging:  DG Abd Portable 1V  Result Date: 07/28/2022 CLINICAL DATA:  Duodenal nodule. Clips were placed to identify area of possible recent bleeding. EXAM: PORTABLE ABDOMEN - 1 VIEW COMPARISON:  Abdominal radiograph 05/06/2022 FINDINGS: There is a clip in the midline overlying the L4 vertebral body, this may be the referenced duodenal clip. Multiple surgical clips in the right upper and lower quadrants were present on prior exam. L2-L3 fusion hardware with interbody spacers. No bowel dilatation to suggest obstruction. No significant formed stool in the colon. IMPRESSION: A clip is seen in the midline over the L4 vertebral body, this may be the referenced duodenal clip. Electronically Signed   By: Keith Rake M.D.   On: 07/28/2022 19:06   DG CHEST PORT 1 VIEW  Result Date: 07/28/2022 CLINICAL DATA:  Evaluate for aspiration pneumonia, postoperative state EXAM: PORTABLE CHEST 1 VIEW COMPARISON:  12/05/2019 FINDINGS: Transverse diameter of heart is increased. Subtle increased markings are seen in the left lower lung field. There is a  interval clearing of extensive patchy infiltrates in both lungs. There is no pleural effusion or pneumothorax. There is a metallic structure in the left superior mediastinum with no significant  change, possibly residual change from previous vascular intervention. There is surgical fusion in cervical spine. IMPRESSION: Small patchy focus of increased interstitial markings in left lower lung fields may suggest scarring or interstitial pneumonia. Electronically Signed   By: Elmer Picker M.D.   On: 07/28/2022 15:18    Lab Results: Recent Labs    07/27/22 0831 07/27/22 2103 07/28/22 0353 07/29/22 0356  WBC 11.0*  --  7.3 15.6*  HGB 12.8 11.2* 10.8* 10.6*  HCT 39.9 34.4* 33.7* 32.9*  PLT 303  --  258 253   BMET Recent Labs    07/27/22 0831 07/28/22 0353 07/29/22 0356  NA 142 138 138  K 4.0 4.1 3.6  CL 108 105 102  CO2 '28 28 30  '$ GLUCOSE 114* 96 105*  BUN '19 12 11  '$ CREATININE 0.68 0.71 0.73  CALCIUM 8.8* 8.5* 8.2*   LFT Recent Labs    07/29/22 0356  PROT 5.2*  ALBUMIN 2.8*  AST 21  ALT 29  ALKPHOS 43  BILITOT 1.5*   PT/INR Recent Labs    07/27/22 0930  LABPROT 13.7  INR 1.1     Scheduled inpatient medications:   diltiazem  180 mg Oral Daily   docusate sodium  200 mg Oral QHS   iohexol  500 mL Oral Q1H   iohexol       [START ON 07/30/2022] pantoprazole  40 mg Intravenous Q12H   traMADol  100 mg Oral BID   Continuous inpatient infusions:   ampicillin-sulbactam (UNASYN) IV 3 g (07/29/22 0359)   lactated ringers 75 mL/hr at 07/28/22 1900   pantoprazole 8 mg/hr (07/29/22 0613)   PRN inpatient medications: acetaminophen **OR** acetaminophen, HYDROcodone-acetaminophen, iohexol, ondansetron **OR** ondansetron (ZOFRAN) IV, promethazine  Vital signs in last 24 hours: Temp:  [98.1 F (36.7 C)-100.2 F (37.9 C)] 98.6 F (37 C) (11/22 0528) Pulse Rate:  [77-106] 96 (11/22 0528) Resp:  [13-31] 16 (11/22 0528) BP: (90-137)/(39-71) 90/56 (11/22 0528) SpO2:  [90 %-100 %] 95 % (11/22 0800) Weight:  [89.4 kg] 89.4 kg (11/21 1134) Last BM Date : 07/27/22  Intake/Output Summary (Last 24 hours) at 07/29/2022 1107 Last data filed at 07/29/2022  0610 Gross per 24 hour  Intake 2583.27 ml  Output --  Net 2583.27 ml    Intake/Output from previous day: 11/21 0701 - 11/22 0700 In: 2583.3 [I.V.:2383.3; IV Piggyback:200] Out: -  Intake/Output this shift: No intake/output data recorded.   Physical Exam:  General: Alert female in NAD Heart:  Regular rate and rhythm. No lower extremity edema Pulmonary: Normal respiratory effort Abdomen: Soft, nondistended, nontender. Normal bowel sounds.  Neurologic: Alert and oriented Psych: Pleasant. Cooperative.    Principal Problem:   UGI bleed Active Problems:   GERD (gastroesophageal reflux disease)   Paroxysmal atrial fibrillation (HCC)   Class 1 obesity     LOS: 2 days   Tye Savoy ,NP 07/29/2022, 11:07 AM

## 2022-07-31 ENCOUNTER — Encounter (HOSPITAL_COMMUNITY): Payer: Self-pay | Admitting: Gastroenterology

## 2022-07-31 ENCOUNTER — Other Ambulatory Visit (HOSPITAL_COMMUNITY): Payer: Self-pay | Admitting: Neurosurgery

## 2022-07-31 DIAGNOSIS — M5440 Lumbago with sciatica, unspecified side: Secondary | ICD-10-CM

## 2022-08-02 ENCOUNTER — Other Ambulatory Visit: Payer: Self-pay

## 2022-08-02 ENCOUNTER — Inpatient Hospital Stay (HOSPITAL_COMMUNITY): Payer: PRIVATE HEALTH INSURANCE | Admitting: Anesthesiology

## 2022-08-02 ENCOUNTER — Inpatient Hospital Stay (HOSPITAL_COMMUNITY)
Admission: EM | Admit: 2022-08-02 | Discharge: 2022-09-07 | DRG: 378 | Disposition: E | Payer: PRIVATE HEALTH INSURANCE | Attending: Internal Medicine | Admitting: Internal Medicine

## 2022-08-02 ENCOUNTER — Emergency Department (HOSPITAL_COMMUNITY): Payer: PRIVATE HEALTH INSURANCE

## 2022-08-02 ENCOUNTER — Inpatient Hospital Stay (HOSPITAL_COMMUNITY): Payer: PRIVATE HEALTH INSURANCE

## 2022-08-02 ENCOUNTER — Encounter (HOSPITAL_COMMUNITY): Admission: EM | Disposition: E | Payer: Self-pay | Source: Home / Self Care | Attending: Internal Medicine

## 2022-08-02 ENCOUNTER — Encounter (HOSPITAL_COMMUNITY): Payer: Self-pay | Admitting: Emergency Medicine

## 2022-08-02 DIAGNOSIS — J9600 Acute respiratory failure, unspecified whether with hypoxia or hypercapnia: Secondary | ICD-10-CM | POA: Diagnosis not present

## 2022-08-02 DIAGNOSIS — K260 Acute duodenal ulcer with hemorrhage: Secondary | ICD-10-CM | POA: Diagnosis not present

## 2022-08-02 DIAGNOSIS — I4891 Unspecified atrial fibrillation: Secondary | ICD-10-CM | POA: Diagnosis not present

## 2022-08-02 DIAGNOSIS — M48061 Spinal stenosis, lumbar region without neurogenic claudication: Secondary | ICD-10-CM | POA: Diagnosis not present

## 2022-08-02 DIAGNOSIS — K264 Chronic or unspecified duodenal ulcer with hemorrhage: Secondary | ICD-10-CM | POA: Diagnosis present

## 2022-08-02 DIAGNOSIS — K3189 Other diseases of stomach and duodenum: Secondary | ICD-10-CM

## 2022-08-02 DIAGNOSIS — M47816 Spondylosis without myelopathy or radiculopathy, lumbar region: Secondary | ICD-10-CM | POA: Diagnosis present

## 2022-08-02 DIAGNOSIS — K317 Polyp of stomach and duodenum: Secondary | ICD-10-CM

## 2022-08-02 DIAGNOSIS — M419 Scoliosis, unspecified: Secondary | ICD-10-CM | POA: Diagnosis present

## 2022-08-02 DIAGNOSIS — K219 Gastro-esophageal reflux disease without esophagitis: Secondary | ICD-10-CM | POA: Diagnosis present

## 2022-08-02 DIAGNOSIS — I700 Atherosclerosis of aorta: Secondary | ICD-10-CM | POA: Diagnosis present

## 2022-08-02 DIAGNOSIS — I468 Cardiac arrest due to other underlying condition: Secondary | ICD-10-CM | POA: Diagnosis not present

## 2022-08-02 DIAGNOSIS — D62 Acute posthemorrhagic anemia: Secondary | ICD-10-CM | POA: Diagnosis not present

## 2022-08-02 DIAGNOSIS — K449 Diaphragmatic hernia without obstruction or gangrene: Secondary | ICD-10-CM | POA: Diagnosis present

## 2022-08-02 DIAGNOSIS — R195 Other fecal abnormalities: Secondary | ICD-10-CM | POA: Diagnosis not present

## 2022-08-02 DIAGNOSIS — Z981 Arthrodesis status: Secondary | ICD-10-CM | POA: Diagnosis not present

## 2022-08-02 DIAGNOSIS — K5520 Angiodysplasia of colon without hemorrhage: Secondary | ICD-10-CM | POA: Diagnosis not present

## 2022-08-02 DIAGNOSIS — M1990 Unspecified osteoarthritis, unspecified site: Secondary | ICD-10-CM | POA: Diagnosis present

## 2022-08-02 DIAGNOSIS — D649 Anemia, unspecified: Secondary | ICD-10-CM

## 2022-08-02 DIAGNOSIS — K31819 Angiodysplasia of stomach and duodenum without bleeding: Secondary | ICD-10-CM | POA: Diagnosis not present

## 2022-08-02 DIAGNOSIS — M5137 Other intervertebral disc degeneration, lumbosacral region: Secondary | ICD-10-CM | POA: Diagnosis present

## 2022-08-02 DIAGNOSIS — K921 Melena: Secondary | ICD-10-CM

## 2022-08-02 DIAGNOSIS — K269 Duodenal ulcer, unspecified as acute or chronic, without hemorrhage or perforation: Secondary | ICD-10-CM | POA: Diagnosis not present

## 2022-08-02 DIAGNOSIS — Z87891 Personal history of nicotine dependence: Secondary | ICD-10-CM

## 2022-08-02 DIAGNOSIS — K5521 Angiodysplasia of colon with hemorrhage: Principal | ICD-10-CM | POA: Diagnosis present

## 2022-08-02 DIAGNOSIS — E871 Hypo-osmolality and hyponatremia: Secondary | ICD-10-CM | POA: Diagnosis not present

## 2022-08-02 DIAGNOSIS — Z811 Family history of alcohol abuse and dependence: Secondary | ICD-10-CM

## 2022-08-02 DIAGNOSIS — Z6372 Alcoholism and drug addiction in family: Secondary | ICD-10-CM

## 2022-08-02 DIAGNOSIS — Z6835 Body mass index (BMI) 35.0-35.9, adult: Secondary | ICD-10-CM | POA: Diagnosis not present

## 2022-08-02 DIAGNOSIS — I959 Hypotension, unspecified: Secondary | ICD-10-CM | POA: Diagnosis not present

## 2022-08-02 DIAGNOSIS — E8809 Other disorders of plasma-protein metabolism, not elsewhere classified: Secondary | ICD-10-CM | POA: Diagnosis not present

## 2022-08-02 DIAGNOSIS — Z888 Allergy status to other drugs, medicaments and biological substances status: Secondary | ICD-10-CM

## 2022-08-02 DIAGNOSIS — Z79899 Other long term (current) drug therapy: Secondary | ICD-10-CM

## 2022-08-02 DIAGNOSIS — Z96641 Presence of right artificial hip joint: Secondary | ICD-10-CM | POA: Diagnosis present

## 2022-08-02 DIAGNOSIS — Z833 Family history of diabetes mellitus: Secondary | ICD-10-CM | POA: Diagnosis not present

## 2022-08-02 DIAGNOSIS — K920 Hematemesis: Secondary | ICD-10-CM | POA: Diagnosis not present

## 2022-08-02 DIAGNOSIS — E669 Obesity, unspecified: Secondary | ICD-10-CM | POA: Diagnosis not present

## 2022-08-02 DIAGNOSIS — I341 Nonrheumatic mitral (valve) prolapse: Secondary | ICD-10-CM | POA: Diagnosis present

## 2022-08-02 DIAGNOSIS — I480 Paroxysmal atrial fibrillation: Secondary | ICD-10-CM | POA: Diagnosis not present

## 2022-08-02 DIAGNOSIS — K922 Gastrointestinal hemorrhage, unspecified: Secondary | ICD-10-CM | POA: Diagnosis not present

## 2022-08-02 DIAGNOSIS — K5731 Diverticulosis of large intestine without perforation or abscess with bleeding: Secondary | ICD-10-CM | POA: Diagnosis present

## 2022-08-02 DIAGNOSIS — M5136 Other intervertebral disc degeneration, lumbar region: Secondary | ICD-10-CM | POA: Diagnosis present

## 2022-08-02 DIAGNOSIS — M1991 Primary osteoarthritis, unspecified site: Secondary | ICD-10-CM | POA: Diagnosis not present

## 2022-08-02 DIAGNOSIS — T183XXA Foreign body in small intestine, initial encounter: Secondary | ICD-10-CM

## 2022-08-02 DIAGNOSIS — M4800 Spinal stenosis, site unspecified: Secondary | ICD-10-CM | POA: Diagnosis present

## 2022-08-02 DIAGNOSIS — I10 Essential (primary) hypertension: Secondary | ICD-10-CM | POA: Diagnosis not present

## 2022-08-02 DIAGNOSIS — Z825 Family history of asthma and other chronic lower respiratory diseases: Secondary | ICD-10-CM

## 2022-08-02 HISTORY — PX: GIVENS CAPSULE STUDY: SHX5432

## 2022-08-02 HISTORY — PX: ENTEROSCOPY: SHX5533

## 2022-08-02 LAB — POC OCCULT BLOOD, ED: Fecal Occult Bld: POSITIVE — AB

## 2022-08-02 LAB — COMPREHENSIVE METABOLIC PANEL
ALT: 18 U/L (ref 0–44)
AST: 22 U/L (ref 15–41)
Albumin: 3.3 g/dL — ABNORMAL LOW (ref 3.5–5.0)
Alkaline Phosphatase: 41 U/L (ref 38–126)
Anion gap: 10 (ref 5–15)
BUN: 10 mg/dL (ref 8–23)
CO2: 26 mmol/L (ref 22–32)
Calcium: 8.9 mg/dL (ref 8.9–10.3)
Chloride: 106 mmol/L (ref 98–111)
Creatinine, Ser: 0.72 mg/dL (ref 0.44–1.00)
GFR, Estimated: >60 mL/min (ref >60)
Glucose, Bld: 104 mg/dL — ABNORMAL HIGH (ref 70–99)
Potassium: 4 mmol/L (ref 3.5–5.1)
Sodium: 142 mmol/L (ref 135–145)
Total Bilirubin: 1 mg/dL (ref 0.3–1.2)
Total Protein: 6.6 g/dL (ref 6.5–8.1)

## 2022-08-02 LAB — CBC WITH DIFFERENTIAL/PLATELET
Abs Immature Granulocytes: 0.09 10*3/uL — ABNORMAL HIGH (ref 0.00–0.07)
Basophils Absolute: 0.1 10*3/uL (ref 0.0–0.1)
Basophils Relative: 1 %
Eosinophils Absolute: 0.1 10*3/uL (ref 0.0–0.5)
Eosinophils Relative: 1 %
HCT: 33.7 % — ABNORMAL LOW (ref 36.0–46.0)
Hemoglobin: 10.9 g/dL — ABNORMAL LOW (ref 12.0–15.0)
Immature Granulocytes: 1 %
Lymphocytes Relative: 11 %
Lymphs Abs: 1.5 10*3/uL (ref 0.7–4.0)
MCH: 31.6 pg (ref 26.0–34.0)
MCHC: 32.3 g/dL (ref 30.0–36.0)
MCV: 97.7 fL (ref 80.0–100.0)
Monocytes Absolute: 1 10*3/uL (ref 0.1–1.0)
Monocytes Relative: 7 %
Neutro Abs: 10.8 10*3/uL — ABNORMAL HIGH (ref 1.7–7.7)
Neutrophils Relative %: 79 %
Platelets: 334 10*3/uL (ref 150–400)
RBC: 3.45 MIL/uL — ABNORMAL LOW (ref 3.87–5.11)
RDW: 15.3 % (ref 11.5–15.5)
WBC: 13.5 10*3/uL — ABNORMAL HIGH (ref 4.0–10.5)
nRBC: 0 % (ref 0.0–0.2)

## 2022-08-02 LAB — PROTIME-INR
INR: 1.1 (ref 0.8–1.2)
Prothrombin Time: 13.7 seconds (ref 11.4–15.2)

## 2022-08-02 LAB — HEMOGLOBIN AND HEMATOCRIT, BLOOD
HCT: 33.9 % — ABNORMAL LOW (ref 36.0–46.0)
HCT: 29.6 % — ABNORMAL LOW (ref 36.0–46.0)
Hemoglobin: 10.8 g/dL — ABNORMAL LOW (ref 12.0–15.0)
Hemoglobin: 9.4 g/dL — ABNORMAL LOW (ref 12.0–15.0)

## 2022-08-02 LAB — LACTIC ACID, PLASMA: Lactic Acid, Venous: 1.9 mmol/L (ref 0.5–1.9)

## 2022-08-02 SURGERY — ENTEROSCOPY
Anesthesia: General

## 2022-08-02 MED ORDER — METOCLOPRAMIDE HCL 5 MG/ML IJ SOLN
10.0000 mg | Freq: Once | INTRAMUSCULAR | Status: AC
  Administered 2022-08-02: 10 mg via INTRAVENOUS
  Filled 2022-08-02: qty 2

## 2022-08-02 MED ORDER — SUCCINYLCHOLINE CHLORIDE 200 MG/10ML IV SOSY
PREFILLED_SYRINGE | INTRAVENOUS | Status: DC | PRN
  Administered 2022-08-02: 80 mg via INTRAVENOUS

## 2022-08-02 MED ORDER — SCOPOLAMINE 1 MG/3DAYS TD PT72
MEDICATED_PATCH | TRANSDERMAL | Status: AC
  Filled 2022-08-02: qty 1

## 2022-08-02 MED ORDER — MIDAZOLAM HCL 2 MG/2ML IJ SOLN
INTRAMUSCULAR | Status: DC | PRN
  Administered 2022-08-02 (×2): 1 mg via INTRAVENOUS

## 2022-08-02 MED ORDER — AMISULPRIDE (ANTIEMETIC) 5 MG/2ML IV SOLN
INTRAVENOUS | Status: AC
  Filled 2022-08-02: qty 4

## 2022-08-02 MED ORDER — LACTATED RINGERS IV SOLN: INTRAVENOUS | Status: DC | PRN

## 2022-08-02 MED ORDER — PANTOPRAZOLE INFUSION (NEW) - SIMPLE MED
8.0000 mg/h | INTRAVENOUS | Status: AC
  Administered 2022-08-02 – 2022-08-04 (×6): 8 mg/h via INTRAVENOUS
  Filled 2022-08-02 (×2): qty 80
  Filled 2022-08-02 (×4): qty 100
  Filled 2022-08-02 (×2): qty 80

## 2022-08-02 MED ORDER — PHENYLEPHRINE HCL-NACL 20-0.9 MG/250ML-% IV SOLN
INTRAVENOUS | Status: DC | PRN
  Administered 2022-08-02: 25 ug/min via INTRAVENOUS

## 2022-08-02 MED ORDER — FENTANYL CITRATE (PF) 100 MCG/2ML IJ SOLN
INTRAMUSCULAR | Status: DC | PRN
  Administered 2022-08-02 (×2): 50 ug via INTRAVENOUS

## 2022-08-02 MED ORDER — IOHEXOL 350 MG/ML SOLN
100.0000 mL | Freq: Once | INTRAVENOUS | Status: AC | PRN
  Administered 2022-08-02: 100 mL via INTRAVENOUS

## 2022-08-02 MED ORDER — SCOPOLAMINE 1 MG/3DAYS TD PT72
MEDICATED_PATCH | TRANSDERMAL | Status: DC | PRN
  Administered 2022-08-02: 1 via TRANSDERMAL

## 2022-08-02 MED ORDER — PANTOPRAZOLE 80MG IVPB - SIMPLE MED
80.0000 mg | Freq: Once | INTRAVENOUS | Status: AC
  Administered 2022-08-02: 80 mg via INTRAVENOUS
  Filled 2022-08-02: qty 80

## 2022-08-02 MED ORDER — FENTANYL CITRATE (PF) 100 MCG/2ML IJ SOLN
INTRAMUSCULAR | Status: AC
  Filled 2022-08-02: qty 2

## 2022-08-02 MED ORDER — SCOPOLAMINE 1 MG/3DAYS TD PT72: 1.0000 | MEDICATED_PATCH | TRANSDERMAL | Status: DC

## 2022-08-02 MED ORDER — SODIUM CHLORIDE 0.9 % IV SOLN: INTRAVENOUS | Status: DC

## 2022-08-02 MED ORDER — TRAMADOL HCL 50 MG PO TABS: 50.0000 mg | ORAL_TABLET | Freq: Two times a day (BID) | ORAL | Status: DC

## 2022-08-02 MED ORDER — DEXMEDETOMIDINE HCL IN NACL 80 MCG/20ML IV SOLN
INTRAVENOUS | Status: DC | PRN
  Administered 2022-08-02: 8 ug via BUCCAL

## 2022-08-02 MED ORDER — ONDANSETRON HCL 4 MG/2ML IJ SOLN
INTRAMUSCULAR | Status: DC | PRN
  Administered 2022-08-02: 4 mg via INTRAVENOUS

## 2022-08-02 MED ORDER — LIDOCAINE 2% (20 MG/ML) 5 ML SYRINGE
INTRAMUSCULAR | Status: DC | PRN
  Administered 2022-08-02: 100 mg via INTRAVENOUS

## 2022-08-02 MED ORDER — MIDAZOLAM HCL 2 MG/2ML IJ SOLN
INTRAMUSCULAR | Status: AC
  Filled 2022-08-02: qty 2

## 2022-08-02 MED ORDER — PROCHLORPERAZINE EDISYLATE 10 MG/2ML IJ SOLN
10.0000 mg | Freq: Four times a day (QID) | INTRAMUSCULAR | Status: DC | PRN
  Administered 2022-08-03 – 2022-08-10 (×5): 10 mg via INTRAVENOUS
  Filled 2022-08-02 (×5): qty 2

## 2022-08-02 MED ORDER — PROPOFOL 500 MG/50ML IV EMUL
INTRAVENOUS | Status: DC | PRN
  Administered 2022-08-02: 150 ug/kg/min via INTRAVENOUS

## 2022-08-02 MED ORDER — PROPOFOL 10 MG/ML IV BOLUS
INTRAVENOUS | Status: DC | PRN
  Administered 2022-08-02: 100 mg via INTRAVENOUS

## 2022-08-02 MED ORDER — TRAMADOL HCL 50 MG PO TABS
100.0000 mg | ORAL_TABLET | Freq: Two times a day (BID) | ORAL | Status: AC
  Administered 2022-08-02 – 2022-08-03 (×2): 100 mg via ORAL
  Filled 2022-08-02 (×2): qty 2

## 2022-08-02 SURGICAL SUPPLY — 1 items: TOWEL COTTON PACK 4EA (MISCELLANEOUS) ×2 IMPLANT

## 2022-08-02 NOTE — Progress Notes (Signed)
Patient had one episode of bleeding while peeing.Dark red blood with some clots seen. RN examined the rectum there was no bleeding visualized outside rectal area/anus. MD was notified. Pt. Denies dizziness and abdominal pain.

## 2022-08-02 NOTE — Anesthesia Procedure Notes (Signed)
Date/Time: 07/10/2022 12:27 PM  Performed by: Cynda Familia, CRNAOxygen Delivery Method: Simple face mask Placement Confirmation: breath sounds checked- equal and bilateral and positive ETCO2 Dental Injury: Teeth and Oropharynx as per pre-operative assessment

## 2022-08-02 NOTE — Anesthesia Procedure Notes (Signed)
Procedure Name: Intubation Date/Time: 07/12/2022 11:40 AM  Performed by: Cynda Familia, CRNAPre-anesthesia Checklist: Patient identified, Emergency Drugs available, Suction available and Patient being monitored Patient Re-evaluated:Patient Re-evaluated prior to induction Oxygen Delivery Method: Circle system utilized Preoxygenation: Pre-oxygenation with 100% oxygen Induction Type: IV induction Ventilation: Mask ventilation without difficulty Laryngoscope Size: Miller and 2 Grade View: Grade II Tube type: Oral Number of attempts: 1 Airway Equipment and Method: Stylet and Oral airway Placement Confirmation: ETT inserted through vocal cords under direct vision, positive ETCO2 and breath sounds checked- equal and bilateral Secured at: 22 cm Tube secured with: Tape Dental Injury: Teeth and Oropharynx as per pre-operative assessment  Comments: IV induction Houser-- intubation AM CRNA atraumatic-- teeth and mouth as preop bilat BS Houser--- bite block by RN

## 2022-08-02 NOTE — Consult Note (Signed)
Gastroenterology Inpatient Consultation   Attending [place] [place] Day: 1  Reason for Consult Recurrent GI bleeding    History of Present Illness  [name] is a 61 y.o. female with a pmh significant for atrial fibrillation (not on anticoagulation), arthritis, DJD, diverticulosis, hiatal hernia, GERD, Schatzki ring, small bowel AVMs.  The GI service is consulted for evaluation and management of hematemesis, acute blood loss anemia, melanic/maroon stools and concern for recurrent GI bleeding.  This patient was recently discharged from the hospital just a few days ago after being hospitalized with hematemesis and acute blood loss anemia.  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  A clip was placed in an area of possible subepithelial lesion versus angulation and this was followed up with a CT scan that did not show any evidence of a vascular lesion within the wall.  The patient had an aspiration pneumonitis develop after her procedure but rapidly improved with antibiotics and was on room air at time of discharge.  She ended up going home feeling somewhat better.  She still had darker stools that were occurring but did not have any recurrent hematemesis.  She completed the antibiotics that were administered her at discharge.  The patient slept and woke up at 6 AM this morning with recurrent abdominal cramping and discomfort.  Subsequently she had vomiting that was noted to be hematemesis and had passage of further dark melanic appearing stool.  Patient came in for further evaluation.  At this point, we are still pending labs at this time as she has just been seen by the emergency department.  She is not having any further nausea but did get one single dose of p.o. Phenergan given by her husband before she left home.  The patient is not taking  any significant nonsteroidals or BC/Goody powders.  There is hesitancy in the setting of the patient's previous aspiration for recurrent procedures.  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   History of vulvar dysplasia    VIN3   Iron deficiency anemia due to chronic blood loss 05/05/2022   Lumbar spondylosis    OA (osteoarthritis)    PMB (postmenopausal bleeding)    Scoliosis    Wears glasses    Past Surgical History:  Procedure Laterality Date   ANTERIOR CERVICAL DECOMP/DISCECTOMY FUSION  11-25-2009  @  [facility]   C4 --- C7   ANTERIOR LAT LUMBAR FUSION Left 10/22/2016   Procedure: Anterior Lateral Lumbar Fusion - Lumbar two - lumbar three, with Lateral plate;  Surgeon: Kary Kos, MD;  Location: [facility];  Service: Neurosurgery;  Laterality: Left;  Anterior Lateral Lumbar Fusion - Lumbar two - lumbar three, with Lateral plate   CARDIAC CATHETERIZATION  age 51   CO2 LASER  APPLICATION N/A 30/86/5784   Procedure: CO2 LASER APPLICATION;  Surgeon: Gus Height, MD;  Location: [facility];  Service: Gynecology;  Laterality: N/A;   COLONOSCOPY W/ POLYPECTOMY  06/21/2017   Dr.Perry   ENTEROSCOPY N/A 07/28/2022   Procedure: ENTEROSCOPY;  Surgeon: Rush Landmark Telford Nab., MD;  Location: [facility];  Service: Gastroenterology;  Laterality: N/A;   HEMOSTASIS CLIP PLACEMENT  07/28/2022   Procedure: HEMOSTASIS CLIP PLACEMENT;  Surgeon: Irving Copas., MD;  Location: [facility];  Service: Gastroenterology;;   HOT HEMOSTASIS N/A 07/28/2022   Procedure: HOT HEMOSTASIS (ARGON PLASMA COAGULATION/BICAP);  Surgeon: Irving Copas., MD;  Location: [facility];  Service: Gastroenterology;  Laterality: N/A;   HYSTEROSCOPY WITH D & C N/A 08/27/2020   Procedure: DILATATION AND CURETTAGE /HYSTEROSCOPY;  Surgeon: Vanessa Kick, MD;  Location: [facility];  Service: Gynecology;  Laterality: N/A;   LAPAROSCOPIC CHOLECYSTECTOMY  04/2006   LEG SURGERY Right 1983   femoral artery repair from cardiac cath injury    OPEN REDUCTION INTERNAL FIXATION (ORIF) METACARPAL Right 08/12/2017   Procedure: OPEN REDUCTION INTERNAL FIXATION (ORIF) 5th METACARPAL fracture;  Surgeon: Roseanne Kaufman, MD;  Location: [facility];  Service: Orthopedics;  Laterality: Right;  60 mins   SPINAL FIXATION SURGERY  1973   age 89 - sclosis   SUBMUCOSAL TATTOO INJECTION  07/28/2022   Procedure: SUBMUCOSAL TATTOO INJECTION;  Surgeon: Irving Copas., MD;  Location: [facility];  Service: Gastroenterology;;   TOTAL HIP ARTHROPLASTY Right 06-16-2010 '@[facility]'$    UPPER GASTROINTESTINAL ENDOSCOPY     VSD REPAIR  age 21   VULVECTOMY N/A 09/18/2015   Procedure: WIDE EXCISION VULVECTOMY;  Surgeon: Vanessa Kick, MD;  Location: [facility];  Service: Gynecology;  Laterality: N/A;    Allergies Allergies  Allergen Reactions   Tape Itching and Other (See Comments)    Redness     Family History Family  History  Problem Relation Age of Onset   COPD Mother    Diabetes Father    Pneumonia Father    Alcoholism Father    Diabetes Other    Heart disease Other    Colon cancer Neg Hx    Esophageal cancer Neg Hx    Stomach cancer Neg Hx     Social History Social History   Socioeconomic History   Marital status: Married    Spouse name: Not on file   Number of children: Not on file   Years of education: Not on file   Highest education level: Not on file  Occupational History   Occupation: [employer]  Tobacco Use   Smoking status: Former    Packs/day: 1.00    Years: 20.00    Total pack years: 20.00    Types: Cigarettes    Quit date: 09/07/2002    Years since quitting: 19.9   Smokeless tobacco: Never  Vaping Use   Vaping Use: Never used  Substance and Sexual Activity   Alcohol use: Yes    Comment: occ   Drug use: Never   Sexual activity: Yes    Birth control/protection: Post-menopausal  Other  Topics Concern   Not on file  Social History Narrative   Not on file   Social Determinants of Health   Financial Resource Strain: Not on file  Food Insecurity: No Food Insecurity (07/27/2022)   Hunger Vital Sign    Worried About Running Out of Food in the Last Year: Never true    Ran Out of Food in the Last Year: Never true  Transportation Needs: No Transportation Needs (07/27/2022)   PRAPARE - Hydrologist (Medical): No    Lack of Transportation (Non-Medical): No  Physical Activity: Not on file  Stress: Not on file  Social Connections: Not on file  Intimate Partner Violence: Not At Risk (07/27/2022)   Humiliation, Afraid, Rape, and Kick questionnaire    Fear of Current or Ex-Partner: No    Emotionally Abused: No    Physically Abused: No    Sexually Abused: No    Medications  Home Medications No current facility-administered medications on file prior to encounter.   Current Outpatient Medications on File Prior to Encounter  Medication Sig  Dispense Refill   acetaminophen (TYLENOL) 650 MG CR tablet Take 1,300 mg by mouth 2 (two) times daily as needed for pain.     amoxicillin-clavulanate (AUGMENTIN) 875-125 MG tablet Take 1 tablet by mouth 2 (two) times daily. 10 tablet 0   Collagen-Vitamin C-Biotin (COLLAGEN 1500/C PO) Take 1 capsule by mouth daily.     diltiazem (CARDIZEM CD) 180 MG 24 hr capsule Take 1 capsule (180 mg total) by mouth daily. 90 capsule 3   estrogens-methylTEST 1.25-2.5 MG TABS tablet Take 1 tablet by mouth daily.     ferrous sulfate 325 (65 FE) MG EC tablet Take 1 tablet (325 mg total) by mouth 2 (two) times daily. (Patient taking differently: Take 325 mg by mouth every other day.) 60 tablet 3   HYDROcodone-acetaminophen (NORCO) 7.5-325 MG tablet Take 1 tablet by mouth daily as needed for moderate pain.     medroxyPROGESTERone (PROVERA) 5 MG tablet Take 5 mg by mouth daily.     OVER THE COUNTER MEDICATION Take 2 capsules by mouth daily. Zinc, Vitamin C, and Vitamin D     pantoprazole (PROTONIX) 40 MG tablet Take 1 tablet (40 mg total) by mouth 2 (two) times daily. 90 tablet 3   sucralfate (CARAFATE) 1 g tablet Take 1 tablet (1 g total) by mouth 2 (two) times daily for 14 days. 28 tablet 0   traMADol (ULTRAM) 50 MG tablet Take 100 mg by mouth 2 (two) times daily.     Scheduled Inpatient Medications  metoCLOPramide (REGLAN) injection  10 mg Intravenous Once   Continuous Inpatient Infusions  PRN Inpatient Medications   Physical Examination  BP 114/67 (BP Location: Right Arm)   Pulse (!) 101   Temp 98.3 F (36.8 C) (Oral)   Resp 15   SpO2 95%  GEN: NAD, appears stated age, doesn't appear chronically ill, husband at bedside PSYCH: Cooperative, without pressured speech EYE: Conjunctivae pale-pink ENT: MMM CV: Mildly tachycardic RESP: No audible wheezing, slightly decreased breath sounds at the left base GI: NABS, soft, protuberant abdomen, distended, nontender, no rebound or guarding NT/ND, without  rebound or guarding, no HSM appreciated GU: DRE not performed (I saw pictures of melanic appearing stool and coffee-ground emesis on patient's phone) MSK/EXT: Trace bilateral pedal edema SKIN: No jaundice NEURO:  Alert & Oriented x 3, no focal deficits   Review of Data  I reviewed the following data at  the time of this encounter:  Laboratory Studies   Recent Labs  Lab 07/29/22 0356  NA 138  K 3.6  CL 102  CO2 30  BUN 11  CREATININE 0.73  GLUCOSE 105*  CALCIUM 8.2*   Recent Labs  Lab 07/29/22 0356  AST 21  ALT 29  ALKPHOS 43    Recent Labs  Lab 07/27/22 0831 07/27/22 2103 07/28/22 0353 07/29/22 0356  WBC 11.0*  --  7.3 15.6*  HGB 12.8   < > 10.8* 10.6*  HCT 39.9   < > 33.7* 32.9*  PLT 303  --  258 253   < > = values in this interval not displayed.   Recent Labs  Lab 07/27/22 0930  INR 1.1   Imaging Studies  11/22 CXR  IMPRESSION: Patchy airspace disease in the parahilar left lung compatible with pneumonia. Follow-up recommended to ensure resolution.  11/22 CTAP IMPRESSION: 1. No lesion identified in the duodenum at the site the vascular clips (third portion duodenum). 2. No small-bowel lesion. 3. Mild LEFT colon diverticulosis. 4. LEFT lower lobe PNEUMONIA.  GI Procedures and Studies  SBE - No gross lesions in the entire esophagus. Z-line regular, 37 cm from the incisors. Nonobstructing Schatzki ring. - 4 cm hiatal hernia. - Multiple gastric polyps - fundic gland in appearance. - No other gross lesions in the entire stomach. - Subepithelial duodenal medium-sized nodule (vs significant angulation deformity) with a AVM and small erosion noted. Difficult to get positioning with the pediatric colonoscope due to looping within the stomach to this area. Treated with argon plasma coagulation (APC), then 2 clips (MR conditional) were placed for demarcation purposes/location purposes. - A single non-bleeding angioectasia in the duodenum 4 region. Treated  with argon plasma coagulation (APC). - Normal mucosa was found in the visualized proximal jejunum. Tattooed distal extent of today's procedure.   Assessment  Ms. Yerian is a 61 y.o. female with a pmh significant for atrial fibrillation (not on anticoagulation), arthritis, DJD, diverticulosis, hiatal hernia, GERD, Schatzki ring, small bowel AVMs.  The GI service is consulted for evaluation and management of hematemesis, acute blood loss anemia, melanic/maroon stools and concern for recurrent GI bleeding.  The patient is hemodynamically stable.  Unfortunately she has represented with what appears to be upper GI bleeding in the setting of coffee-ground emesis/hematemesis and dark stools persisting.  She has none of her labs back as of yet as she is just coming through the door but I am concerned that she will likely need repeat endoscopic evaluation.  What I found at the time of her last enteroscopy suggested a potential subepithelial lesion but imaging did not show any abnormalities in that particular area.  I think she needs a repeat upper enteroscopy to reevaluate the sites that I worked on and if we do not find a source of potential active bleeding in that upper GI tract then we can release the capsule study at that time.  I am going to give her a dose of IV Reglan in possible anticipation of a procedure later this morning or early afternoon depending on patient's labs and anesthesia availability.  She has had aspiration pneumonitis with her most recent enteroscopy so I think her procedure should be done with anesthesia assistance.  Because of the aspiration pneumonitis even though she satting 99 to 100% on room air I do want to just get some updated chest x-ray in case our anesthesia colleagues were to want to consider ETT placement for her, since she  has had "prior aspirations with other procedures in the past".  I will talk with our anesthesia colleagues about potentially a procedure later today, but  again this will be based on availability of our anesthesia colleagues and also seeing what her labs look like this morning.  I am writing her for IV Protonix drip to be initiated.  It is not clear to me that this is overtly colonic in source right now since she has had coffee-ground emesis/hematemesis but if we perform her procedures today or tomorrow and we do not find another source of bleeding will need to keep that in mind but hopefully we can find a source and treat that soon.  The risks and benefits of endoscopic evaluation were discussed with the patient; these include but are not limited to the risk of perforation, infection, bleeding, missed lesions, lack of diagnosis, severe illness requiring hospitalization, as well as anesthesia and sedation related illnesses.  The patient and/or family is agreeable to proceed.  All patient questions were answered to the best of my ability, and the patient agrees to the aforementioned plan of action with follow-up as indicated.   Plan/Recommendations  Maintain n.p.o. status Follow-up labs that were drawn stat this morning including CBC and type and screen and CMP Follow-up chest x-ray PA/lateral ordered by ED to follow-up recent pneumonitis IV Protonix drip ordered Place 2 peripheral IVs in this patient at all times (order placed) Potential enteroscopy +/- VCE today versus tomorrow with anesthesia assistance   Thank you for this consult.  We will continue to follow.  Please page/call with questions or concerns.   Justice Britain, MD [facility] Office # [phone]

## 2022-08-02 NOTE — Anesthesia Preprocedure Evaluation (Addendum)
Anesthesia Evaluation  Patient identified by MRN, date of birth, ID band Patient awake    Reviewed: Allergy & Precautions, NPO status , Patient's Chart, lab work & pertinent test results  Airway Mallampati: III  TM Distance: >3 FB Neck ROM: Full    Dental  (+) Dental Advisory Given, Teeth Intact   Pulmonary former smoker   Pulmonary exam normal breath sounds clear to auscultation       Cardiovascular negative cardio ROS  Rhythm:Irregular Rate:Tachycardia  Echo 2021  1. Left ventricular ejection fraction, by estimation, is 60 to 65%. The left ventricle has normal function. The left ventricle has no regional wall motion abnormalities. Left ventricular diastolic function could not be evaluated.   2. Right ventricular systolic function is normal. The right ventricular size is normal. There is normal pulmonary artery systolic pressure. The estimated right ventricular systolic pressure is 48.0 mmHg.   3. Left atrial size was mildly dilated.   4. A small pericardial effusion is present. The pericardial effusion is posterior to the left ventricle. There is no evidence of cardiac tamponade.   5. Hx of MV repair. Either an annuloplasty ring is present or mild MAC is present. Unclear surgical details, but does not have typical appearance of annuloplasty ring. The mitral valve has been repaired/replaced. Mild mitral valve regurgitation. No evidence of mitral stenosis.   6. The aortic valve is tricuspid. There is mild calcification of the aortic valve. There is mild thickening of the aortic valve. Aortic valve regurgitation is not visualized. Mild aortic valve sclerosis is present, with no evidence of aortic valve stenosis.   7. The inferior vena cava is normal in size with greater than 50% respiratory variability, suggesting right atrial pressure of 3 mmHg.      Neuro/Psych negative neurological ROS  negative psych ROS   GI/Hepatic Neg liver  ROS,GERD  ,,  Endo/Other  negative endocrine ROS    Renal/GU negative Renal ROSLab Results      Component                Value               Date                      CREATININE               0.72                07/21/2022                BUN                      10                  07/16/2022                NA                       142                 07/30/2022                K                        4.0                 07/29/2022  Musculoskeletal  (+) Arthritis ,    Abdominal  (+) + obese (BMI 36)  Peds  Hematology  (+) Blood dyscrasia, anemia Lab Results      Component                Value               Date                      WBC                      13.5 (H)            07/15/2022                HGB                      10.9 (L)            08/05/2022                HCT                      33.7 (L)            07/31/2022               PLT                      334                 07/29/2022              Anesthesia Other Findings NKDA   Reproductive/Obstetrics                             Anesthesia Physical Anesthesia Plan  ASA: 3 and emergent  Anesthesia Plan: General   Post-op Pain Management: Minimal or no pain anticipated and Precedex   Induction: Intravenous, Rapid sequence and Cricoid pressure planned  PONV Risk Score and Plan: 4 or greater and Ondansetron, Scopolamine patch - Pre-op, TIVA and Treatment may vary due to age or medical condition  Airway Management Planned: Oral ETT  Additional Equipment: None  Intra-op Plan:   Post-operative Plan: Extubation in OR  Informed Consent: I have reviewed the patients History and Physical, chart, labs and discussed the procedure including the risks, benefits and alternatives for the proposed anesthesia with the patient or authorized representative who has indicated his/her understanding and acceptance.     Dental advisory given  Plan Discussed with:  CRNA  Anesthesia Plan Comments: (Enteroscopy for hematemesis to be done under TIVA w ETT)        Anesthesia Quick Evaluation

## 2022-08-02 NOTE — H&P (Signed)
History and Physical    Patient: [name] JSE:[id number] DOB: [birth year] DOA: 07/10/2022 DOS: the patient was seen and examined on 07/09/2022 PCP: Redmond School, MD  Patient coming from: Home  Chief Complaint:  Chief Complaint  Patient presents with   GI Bleeding   HPI: [name] is a 61 y.o. female with medical history significant of PAF, GERD, iron deficiency anemia, OA. Presenting with hematemesis. She was just discharged a few days ago from the hospital after an admission for GIB. She was doing well until yesterday. Yesterday, she felt generally weak. She did not have any chest pain or dyspnea. She was just fatigued. This morning, she woke up w/ N/V. She was vomiting blood. She had atleast 8 episodes of hematemesis this morning. She also had blood in her stool. She had sharp RLQ abdominal pain as well. She tried some phenergan, but she was unable to keep it down. She has not used any NSAIDs or Sunflower since she has been home. Family became concerned and brought her to the ED for evaluation.   Review of Systems: As mentioned in the history of present illness. All other systems reviewed and are negative. 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   History of vulvar dysplasia    VIN3   Iron deficiency anemia due to chronic blood loss 05/05/2022   Lumbar spondylosis    OA (osteoarthritis)    PMB (postmenopausal bleeding)     Scoliosis    Wears glasses    Past Surgical History:  Procedure Laterality Date   ANTERIOR CERVICAL DECOMP/DISCECTOMY FUSION  11-25-2009  '@[facility]'$    C4 --- C7   ANTERIOR LAT LUMBAR FUSION Left 10/22/2016   Procedure: Anterior Lateral Lumbar Fusion - Lumbar two - lumbar three, with Lateral plate;  Surgeon: Kary Kos, MD;  Location: [facility];  Service: Neurosurgery;  Laterality: Left;  Anterior Lateral Lumbar Fusion - Lumbar two - lumbar three, with Lateral plate   CARDIAC CATHETERIZATION  age 66   CO2 LASER APPLICATION N/A 07/37/1062   Procedure: CO2 LASER APPLICATION;  Surgeon: Gus Height, MD;  Location: [facility];  Service: Gynecology;  Laterality: N/A;   COLONOSCOPY W/ POLYPECTOMY  06/21/2017   Dr.Perry   ENTEROSCOPY N/A 07/28/2022   Procedure: ENTEROSCOPY;  Surgeon: Rush Landmark Telford Nab., MD;  Location: [facility];  Service: Gastroenterology;  Laterality: N/A;   HEMOSTASIS CLIP PLACEMENT  07/28/2022   Procedure: HEMOSTASIS CLIP PLACEMENT;  Surgeon: Irving Copas., MD;  Location: [facility];  Service: Gastroenterology;;   HOT HEMOSTASIS N/A 07/28/2022   Procedure: HOT HEMOSTASIS (ARGON PLASMA COAGULATION/BICAP);  Surgeon: Irving Copas., MD;  Location: [facility];  Service: Gastroenterology;  Laterality: N/A;   HYSTEROSCOPY WITH D & C N/A 08/27/2020   Procedure: DILATATION AND CURETTAGE /HYSTEROSCOPY;  Surgeon: Vanessa Kick, MD;  Location: [facility];  Service: Gynecology;  Laterality: N/A;   LAPAROSCOPIC CHOLECYSTECTOMY  04/2006   LEG SURGERY Right 1983   femoral artery repair from cardiac cath injury    OPEN REDUCTION INTERNAL FIXATION (ORIF) METACARPAL Right 08/12/2017   Procedure: OPEN REDUCTION INTERNAL FIXATION (ORIF) 5th METACARPAL fracture;  Surgeon: Roseanne Kaufman, MD;  Location: [facility];  Service: Orthopedics;  Laterality: Right;  60 mins   SPINAL FIXATION SURGERY  1973   age 41 - sclosis   SUBMUCOSAL TATTOO INJECTION  07/28/2022   Procedure:  SUBMUCOSAL TATTOO INJECTION;  Surgeon: Irving Copas., MD;  Location: [facility];  Service: Gastroenterology;;   TOTAL HIP ARTHROPLASTY Right 06-16-2010 '@[facility]'$    UPPER GASTROINTESTINAL ENDOSCOPY     VSD REPAIR  age 52   VULVECTOMY N/A 09/18/2015   Procedure: WIDE EXCISION VULVECTOMY;  Surgeon: Vanessa Kick, MD;  Location: [facility];  Service: Gynecology;  Laterality: N/A;   Social History:  reports that she quit smoking about 19 years ago. Her smoking use included cigarettes. She has a 20.00 pack-year smoking history. She has never used smokeless tobacco. She reports current alcohol use. She reports that she does not use drugs.  Allergies  Allergen Reactions   Tape Itching and Other (See Comments)    Redness     Family History  Problem Relation Age of Onset   COPD Mother    Diabetes Father    Pneumonia Father    Alcoholism Father    Diabetes Other    Heart disease Other    Colon cancer Neg Hx    Esophageal cancer Neg Hx    Stomach cancer Neg Hx     Prior to Admission medications   Medication Sig Start Date End Date Taking? Authorizing Provider  acetaminophen (TYLENOL) 650 MG CR tablet Take 1,300 mg by mouth 2 (two) times daily as needed for pain.    [provider]  amoxicillin-clavulanate (AUGMENTIN) 875-125 MG tablet Take 1 tablet by mouth 2 (two) times daily. 07/29/22   Danford, Suann Larry, MD  Collagen-Vitamin C-Biotin (COLLAGEN 1500/C PO) Take 1 capsule by mouth daily.    [provider]  diltiazem (CARDIZEM CD) 180 MG 24 hr capsule Take 1 capsule (180 mg total) by mouth daily. 09/09/21   Imogene Burn, PA-C  estrogens-methylTEST 1.25-2.5 MG TABS tablet Take 1 tablet by mouth daily.    [provider]  ferrous sulfate 325 (65 FE) MG EC tablet Take 1 tablet (325 mg total) by mouth 2 (two) times daily. Patient taking differently: Take 325 mg by mouth every other day. 05/04/22   [name], MD  HYDROcodone-acetaminophen (NORCO) 7.5-325 MG  tablet Take 1 tablet by mouth daily as needed for moderate pain. 07/08/22   [provider]  medroxyPROGESTERone (PROVERA) 5 MG tablet Take 5 mg by mouth daily. 10/21/20   [provider]  OVER THE COUNTER MEDICATION Take 2 capsules by mouth daily. Zinc, Vitamin C, and Vitamin D    [provider]  pantoprazole (PROTONIX) 40 MG tablet Take 1 tablet (40 mg total) by mouth 2 (two) times daily. 07/29/22   Danford, Suann Larry, MD  sucralfate (CARAFATE) 1 g tablet Take 1 tablet (1 g total) by mouth 2 (two) times daily for 14 days. 07/29/22 08/12/22  Danford, Suann Larry, MD  traMADol (ULTRAM) 50 MG tablet Take 100 mg by mouth 2 (two) times daily.    [provider]    Physical Exam: Vitals:   08/01/2022 0803 07/13/2022 0804 07/19/2022 0942  BP: 114/67 114/67 Marland Kitchen)  131/45  Pulse: (!) 101 (!) 101 96  Resp: '15 16 16  '$ Temp: 98.3 F (36.8 C)    TempSrc: Oral    SpO2: 95% 92% 97%   General: 61 y.o. female resting in bed in NAD Eyes: PERRL, normal sclera ENMT: Nares patent w/o discharge, orophaynx clear, dentition normal, ears w/o discharge/lesions/ulcers Neck: Supple, trachea midline Cardiovascular: RRR, +S1, S2, no m/g/r, equal pulses throughout Respiratory: CTABL, no w/r/r, normal WOB GI: BS+, ND, RLQ TTP, no masses noted, no organomegaly noted MSK: No e/c/c Neuro: A&O x 3, no focal deficits Psyc: Appropriate interaction and affect, calm/cooperative  Data Reviewed:  Results for orders placed or performed during the hospital encounter of 07/09/2022 (from the past 24 hour(s))  CBC with Differential     Status: Abnormal   Collection Time: 07/09/2022  8:45 AM  Result Value Ref Range   WBC 13.5 (H) 4.0 - 10.5 K/uL   RBC 3.45 (L) 3.87 - 5.11 MIL/uL   Hemoglobin 10.9 (L) 12.0 - 15.0 g/dL   HCT 33.7 (L) 36.0 - 46.0 %   MCV 97.7 80.0 - 100.0 fL   MCH 31.6 26.0 - 34.0 pg   MCHC 32.3 30.0 - 36.0 g/dL   RDW 15.3 11.5 - 15.5 %   Platelets 334 150 - 400 K/uL   nRBC 0.0  0.0 - 0.2 %   Neutrophils Relative % 79 %   Neutro Abs 10.8 (H) 1.7 - 7.7 K/uL   Lymphocytes Relative 11 %   Lymphs Abs 1.5 0.7 - 4.0 K/uL   Monocytes Relative 7 %   Monocytes Absolute 1.0 0.1 - 1.0 K/uL   Eosinophils Relative 1 %   Eosinophils Absolute 0.1 0.0 - 0.5 K/uL   Basophils Relative 1 %   Basophils Absolute 0.1 0.0 - 0.1 K/uL   Immature Granulocytes 1 %   Abs Immature Granulocytes 0.09 (H) 0.00 - 0.07 K/uL  Comprehensive metabolic panel     Status: Abnormal   Collection Time: 07/10/2022  8:45 AM  Result Value Ref Range   Sodium 142 135 - 145 mmol/L   Potassium 4.0 3.5 - 5.1 mmol/L   Chloride 106 98 - 111 mmol/L   CO2 26 22 - 32 mmol/L   Glucose, Bld 104 (H) 70 - 99 mg/dL   BUN 10 8 - 23 mg/dL   Creatinine, Ser 0.72 0.44 - 1.00 mg/dL   Calcium 8.9 8.9 - 10.3 mg/dL   Total Protein 6.6 6.5 - 8.1 g/dL   Albumin 3.3 (L) 3.5 - 5.0 g/dL   AST 22 15 - 41 U/L   ALT 18 0 - 44 U/L   Alkaline Phosphatase 41 38 - 126 U/L   Total Bilirubin 1.0 0.3 - 1.2 mg/dL   GFR, Estimated >60 >60 mL/min   Anion gap 10 5 - 15  Lactic acid, plasma     Status: None   Collection Time: 07/12/2022  8:45 AM  Result Value Ref Range   Lactic Acid, Venous 1.9 0.5 - 1.9 mmol/L  Protime-INR     Status: None   Collection Time: 07/21/2022  8:45 AM  Result Value Ref Range   Prothrombin Time 13.7 11.4 - 15.2 seconds   INR 1.1 0.8 - 1.2  Type and screen [place]     Status: None   Collection Time: 07/18/2022  8:45 AM  Result Value Ref Range   ABO/RH(D) AB POS    Antibody Screen NEG    Sample Expiration  08/05/2022,2359 Performed at [facility], [place] [address], [place], [place] [address]   POC occult blood, ED Provider will collect     Status: Abnormal   Collection Time: 07/30/2022 10:26 AM  Result Value Ref Range   Fecal Occult Bld POSITIVE (A) NEGATIVE   CXR: Significantly improved left lung opacity.   Assessment and Plan: GIB N/V     - admit to inpt,  tele     - NPO     - protonix, fluids     - [facility]I onboard, appreciate assistance     - q6h H&H; transfuse for Hgb < 7     - compazine  GERD     - protonix  PAF     - resume home regimen when off NPO status  Advance Care Planning:   Code Status: FULL  Consults: LBGI  Family Communication: w/ husband at bedside  Severity of Illness: The appropriate patient status for this patient is INPATIENT. Inpatient status is judged to be reasonable and necessary in order to provide the required intensity of service to ensure the patient's safety. The patient's presenting symptoms, physical exam findings, and initial radiographic and laboratory data in the context of their chronic comorbidities is felt to place them at high risk for further clinical deterioration. Furthermore, it is not anticipated that the patient will be medically stable for discharge from the hospital within 2 midnights of admission.   * I certify that at the point of admission it is my clinical judgment that the patient will require inpatient hospital care spanning beyond 2 midnights from the point of admission due to high intensity of service, high risk for further deterioration and high frequency of surveillance required.*  Author: Jonnie Finner, DO 07/20/2022 10:18 AM  For on call review [email or web address].

## 2022-08-02 NOTE — ED Provider Notes (Signed)
[facility] DEPT Provider Note   CSN: [id number] Arrival date & time: 08/03/2022  0745     History PAF, MVP, GI bleed Chief Complaint  Patient presents with   GI Bleeding    [name] is a 61 y.o. female.  61 y.o female with a  PMH of Afib not anticoagulated, mitral valve prolapse, recent GI bleed discharged from the hospital 3 days ago presents to the ED with a chief complaint of nausea, vomiting, blood in her stool.  Patient reports after being discharged 3 days ago she was doing well at home, when suddenly last night she began to develop nausea and vomiting, with multiple episodes.  She also proceeded to have multiple rounds of diarrhea and noted significant blood with clots in her stool.  She was recently discharged from the hospital for the same complaint, did report to have a stable hemoglobin then and was not transfused her last time.  She is also endorsing some generalized abdominal pain, try to take it Phenergan to help with the nausea and vomiting however proceeded to vomit this.  She has not taken any other medication to help with her symptoms.  There is no exacerbated factors.  She followed by Dr. Richardine Service of gastroenterology and was seen by them while in the hospital but they were not able to determine the source of bleeding per patient.  Denies any sick contacts, no fever, no chest pain or shortness of breath. Of note, patient does report being a hard time with anesthesia last time therefore she would not like to have any more intervention at this time. She did receive iron during her last hospitalization but did not require transfusion.  The history is provided by the patient and medical records.       Home Medications Prior to Admission medications   Medication Sig Start Date End Date Taking? Authorizing Provider  acetaminophen (TYLENOL) 650 MG CR tablet Take 1,300 mg by mouth 2 (two) times daily as needed for pain.    [provider]  amoxicillin-clavulanate (AUGMENTIN) 875-125 MG tablet Take 1 tablet by mouth 2 (two) times daily. 07/29/22   Danford, Suann Larry, MD  Collagen-Vitamin C-Biotin (COLLAGEN 1500/C PO) Take 1 capsule by mouth daily.    [provider]  diltiazem (CARDIZEM CD) 180 MG 24 hr capsule Take 1 capsule (180 mg total) by mouth daily. 09/09/21   Imogene Burn, PA-C  estrogens-methylTEST 1.25-2.5 MG TABS tablet Take 1 tablet by mouth daily.    [provider]  ferrous sulfate 325 (65 FE) MG EC tablet Take 1 tablet (325 mg total) by mouth 2 (two) times daily. Patient taking differently: Take 325 mg by mouth every other day. 05/04/22   [name], MD  HYDROcodone-acetaminophen (NORCO) 7.5-325 MG tablet Take 1 tablet by mouth daily as needed for moderate pain. 07/08/22   [provider]  medroxyPROGESTERone (PROVERA) 5 MG tablet Take 5 mg by mouth daily. 10/21/20   [provider]  OVER THE COUNTER MEDICATION Take 2 capsules by mouth daily. Zinc, Vitamin C, and Vitamin D    [provider]  pantoprazole (PROTONIX) 40 MG tablet Take 1 tablet (40 mg total) by mouth 2 (two) times daily. 07/29/22   Danford, Suann Larry, MD  sucralfate (CARAFATE) 1 g tablet Take 1 tablet (1 g total) by mouth 2 (two) times daily for 14 days. 07/29/22 08/12/22  DanfordSuann Larry, MD  traMADol (ULTRAM) 50 MG tablet Take 100 mg  by mouth 2 (two) times daily.    [provider]      Allergies    Tape    Review of Systems   Review of Systems  Constitutional:  Negative for chills and fever.  HENT:  Negative for sore throat.   Respiratory:  Negative for shortness of breath.   Cardiovascular:  Negative for chest pain.  Gastrointestinal:  Positive for abdominal pain, blood in stool, nausea and vomiting.  Genitourinary:  Negative for difficulty urinating and flank pain.  Musculoskeletal:  Negative for back pain.  Neurological:  Negative for light-headedness and  headaches.  All other systems reviewed and are negative.   Physical Exam Updated Vital Signs BP 126/69   Pulse 94   Temp 98.3 F (36.8 C) (Oral)   Resp 20   SpO2 99%  Physical Exam Vitals and nursing note reviewed. Exam conducted with a chaperone present.  Constitutional:      Appearance: Normal appearance. She is not ill-appearing.  HENT:     Head: Normocephalic and atraumatic.     Nose: Nose normal.     Mouth/Throat:     Mouth: Mucous membranes are moist.  Eyes:     Pupils: Pupils are equal, round, and reactive to light.  Cardiovascular:     Rate and Rhythm: Normal rate.  Pulmonary:     Effort: Pulmonary effort is normal.     Breath sounds: No wheezing or rales.  Abdominal:     General: Abdomen is flat. Bowel sounds are normal.     Palpations: Abdomen is soft.     Tenderness: There is generalized abdominal tenderness. There is no guarding or rebound. Negative signs include Murphy's sign.     Hernia: No hernia is present.  Genitourinary:    Rectum: Guaiac result positive. Tenderness present. No anal fissure or external hemorrhoid.     Comments: Chaperoned by Derrill Kay RN. Grossly positive.  Musculoskeletal:     Cervical back: Normal range of motion and neck supple.  Skin:    General: Skin is warm and dry.  Neurological:     Mental Status: She is alert and oriented to person, place, and time.     ED Results / Procedures / Treatments   Labs (all labs ordered are listed, but only abnormal results are displayed) Labs Reviewed  CBC WITH DIFFERENTIAL/PLATELET - Abnormal; Notable for the following components:      Result Value   WBC 13.5 (*)    RBC 3.45 (*)    Hemoglobin 10.9 (*)    HCT 33.7 (*)    Neutro Abs 10.8 (*)    Abs Immature Granulocytes 0.09 (*)    All other components within normal limits  COMPREHENSIVE METABOLIC PANEL - Abnormal; Notable for the following components:   Glucose, Bld 104 (*)    Albumin 3.3 (*)    All other components within normal  limits  POC OCCULT BLOOD, ED - Abnormal; Notable for the following components:   Fecal Occult Bld POSITIVE (*)    All other components within normal limits  LACTIC ACID, PLASMA  PROTIME-INR  TYPE AND SCREEN    EKG None  Radiology DG Chest 2 View  Result Date: 08/01/2022 CLINICAL DATA:  Aspiration. EXAM: CHEST - 2 VIEW COMPARISON:  July 29, 2022. FINDINGS: Stable cardiomediastinal silhouette. Right lung is clear. Significantly improved left lung opacity is noted. Bony thorax is unremarkable. IMPRESSION: Significantly improved left lung opacity. Electronically Signed   By: Bobbe Medico.D.  On: 07/11/2022 09:32    Procedures Procedures    Medications Ordered in ED Medications  pantoprozole (PROTONIX) 80 mg /NS 100 mL infusion (8 mg/hr Intravenous New Bag/Given 07/11/2022 1004)  metoCLOPramide (REGLAN) injection 10 mg (10 mg Intravenous Given 07/19/2022 0923)  pantoprazole (PROTONIX) 80 mg /NS 100 mL IVPB (80 mg Intravenous New Bag/Given 07/12/2022 1005)    ED Course/ Medical Decision Making/ A&P Clinical Course as of 07/10/2022 1029  Sun Aug 02, 2022  1027 Fecal Occult Blood, POC(!): POSITIVE [JS]    Clinical Course User Index [JS] Janeece Fitting, PA-C                           Medical Decision Making Amount and/or Complexity of Data Reviewed Radiology: ordered.    This patient presents to the ED for concern of GI bleed, this involves a number of treatment options, and is a complaint that carries with it a high risk of complications and morbidity.  The differential diagnosis includes recurrent GI bleed, versus Mallory-Weiss tear versus Boerhaave's.   Co morbidities: Discussed in HPI   Brief History:  Patient here with prior history of GI bleed discharged from the hospital 3 days ago with a stable hemoglobin here for recurrent episodes of nausea, blood in her stool with multiple episodes since last night.  Unable to keep anything down, tried taking some Phenergan  without any improvement in symptoms.  Generalized abdominal cramping with no radiation.  Not previously transfused while in the hospital but was given iron infusions.  EMR reviewed including pt PMHx, past surgical history and past visits to ER.   See HPI for more details   Lab Tests:  I ordered and independently interpreted labs.  The pertinent results include:    Labs notable for CBC with a leukocytosis of 13.5 improved from her previous from 3 days ago.  Hemoglobin is 10.9, also improved since her most recent.  Lactic acid is negative.  She is currently on no blood thinners. CMP without any electrolyte derangement, her current levels within normal limits.  LFTs are unremarkable.  Imaging Studies:  Xray of her chest:Significantly improved left lung opacity.   Cardiac Monitoring:  The patient was maintained on a cardiac monitor.  I personally viewed and interpreted the cardiac monitored which showed an underlying rhythm of: Irregularly irregular EKG non-ischemic   Medicines ordered:  I ordered medication including Reglan  for symptomatic control Reevaluation of the patient after these medicines showed that the patient improved I have reviewed the patients home medicines and have made adjustments as needed   Critical Interventions:  Patient will receive IV Protonix '80mg'$  to help with GI component.    Consults:  I requested consultation with Dr. Rush Landmark,  and discussed lab and imaging findings as well as pertinent plan - they recommend: NPO, protonix and will evaluate patient while in the ED.     Reevaluation:  After the interventions noted above I re-evaluated patient and found that they have :improved   Social Determinants of Health:  The patient's social determinants of health were a factor in the care of this patient    Problem List / ED Course:  Patient presents to the ED with nausea, vomiting, blood in her stool which began last night.  Recent admission 3  days ago and discharged from the hospital due to a recent GI bleed.  Not anticoagulated.  With generalized abdominal cramping, attempted to take some Phenergan without much improvement in  symptoms that she vomited this back up.  She did have an EGD during her last hospital visit, they were unable to determine the source of the bleed per patient.  On arrival today patient is overall hemodynamically stable without tachycardia or hypotension.  Abdomen is soft, mildly tender to palpation without any focal point of tenderness.  Her diminished to auscultation however without any obvious wheezing or rales.  Pharynx appears dry without any source of residual blood noted. Extensive chart reviewed reports patient having an EGD 5 days ago by Dr. Rush Landmark of gastroenterology.  Labs on today's visit were remarkable for a CBC which is actually improving from her previous, hemoglobin is trending up.  Lactic acid was negative.  Repeat chest x-ray was ordered as patient did have aspiration during her last procedure, chest x-ray does show improvement of her left lower lobe pneumonia.  Although it is unclear if patient was started on antibiotics. CMP without any electrolyte derangement.  Her kidney function is unremarkable.  LFTs are within normal limits.  I did perform a rectal exam at bedside with chaperone [name], no external hemorrhoids or internal were palpated.  Grossly positive for my exam, call placed to hospitalist for admission Spoke to Dr. Marylyn Ishihara who will admit patient for further management.   Dispostion:  After consideration of the diagnostic results and the patients response to treatment, I feel that the patent would benefit from admission for ongoing GI bleed. Family and patient informed of plan and management.    Portions of this note were generated with L[employer]. Dictation errors may occur despite best attempts at proofreading.   Final Clinical Impression(s) / ED Diagnoses Final diagnoses:   Acute GI bleeding    Rx / DC Orders ED Discharge Orders     None         Janeece Fitting, PA-C 07/13/2022 [place], MD 07/09/2022 1649

## 2022-08-02 NOTE — Op Note (Signed)
[facility] Patient Name: [name] Procedure Date: 07/10/2022 MRN: [id number] Attending MD: Justice Britain , MD, [id number] Date of Birth: June 17, 1961 CSN: [id number] Age: 61 Admit Type: Inpatient Procedure:                Small bowel enteroscopy Indications:              Melena, Arteriovenous malformation in the small                            intestine, Coffee-ground emesis, Hematemesis Providers:                Justice Britain, MD, Elmer Ramp. Tilden Dome, RN, Cletis Athens, Technician Referring MD:             Docia Chuck. Henrene Pastor, MD, inpatient medical service Medicines:                General Anesthesia Complications:            No immediate complications. Estimated Blood Loss:     Estimated blood loss was minimal. Procedure:                Pre-Anesthesia Assessment:                           - Prior to the procedure, a History and Physical                            was performed, and patient medications and                            allergies were reviewed. The patient's tolerance of                            previous anesthesia was also reviewed. The risks                            and benefits of the procedure and the sedation                            options and risks were discussed with the patient.                            All questions were answered, and informed consent                            was obtained. Prior Anticoagulants: The patient has                            taken no anticoagulant or antiplatelet agents. ASA                            Grade Assessment: III - A patient with severe  systemic disease. After reviewing the risks and                            benefits, the patient was deemed in satisfactory                            condition to undergo the procedure.                           After obtaining informed consent, the endoscope was                            passed under  direct vision. Throughout the                            procedure, the patient's blood pressure, pulse, and                            oxygen saturations were monitored continuously. The                            [id number] ([id number]) Olympus therapeutic endoscope                            was introduced through the mouth and advanced to                            the proximal jejunum. The [id number]-[id number] ([id number])                            Olympus pediatric colonscope was introduced through                            the mouth and advanced to the proximal jejunum. The                            small bowel enteroscopy was accomplished without                            difficulty. The patient tolerated the procedure. Scope In: Scope Out: Findings:      No gross lesions were noted in the entire esophagus.      The Z-line was regular and was found 34 cm from the incisors.      A 5 cm hiatal hernia was present.      Striped mildly erythematous mucosa without bleeding was found in the       gastric body ?" consistent with likely some mild retch gastropathy from       recent vomiting.      Multiple small sessile polyps (consistent with previously noted fundic       gland polyps) with no stigmata of recent bleeding were found in the       gastric body.      No other gross lesions were noted in the entire examined stomach. No       evidence of active or recent bleeding source  noted      Two erosions with no bleeding were found in the third portion of the       duodenum and in the fourth portion of the duodenum (these correspond       with the previous APC sites during last enteroscopy).      A foreign body consistent with previously placed Hemoclip was found in       the third portion of the duodenum. No evidence of any active bleeding or       recent bleeding.      An angulation deformity was found in the third/fourth portion of the       duodenum as previously documented.      Normal  mucosa was found in the entire duodenum otherwise.      Normal mucosa was found in the visualized proximal jejunum. I was able       to visualize the previously placed tattoo in the jejunum (which       demarcated the distal extent of today's procedure as well)      I transition back to the therapeutic endoscope, using the endoscope, the       video capsule enteroscope was advanced into the second portion of the       duodenum and released. Impression:               - No gross lesions in the entire esophagus. Z-line                            regular, 34 cm from the incisors.                           - 5 cm hiatal hernia.                           - Mildly striped erythematous mucosa in the gastric                            body - consistent with reactive gastropathy.                           - Multiple gastric fundic gland polyps.                           - No other gross lesions in the entire stomach. No                            evidence of active or recent bleeding source                           - 2 erosions noted in the third and fourth portions                            of the duodenum (from previous APC sites during                            last enteroscopy).                           -  Hemoclip noted in third portion of duodenum                            without any evidence of any active bleeding or                            recent bleeding from the site                           - Duodenal angulation deformity of D3/D4 (previous                            CT scan showed no evidence of subepithelial lesion                            at that area).                           - Normal mucosa was found in the entire examined                            duodenum otherwise.                           - Normal mucosa was found in the proximal jejunum.                           - Successful completion of the Video Capsule                            Enteroscope placement within  the duodenum.                           - Digital rectal exam performed while under                            anesthesia and showed evidence of maroon                            stool/blood in the vault Moderate Sedation:      Not Applicable - Patient had care per Anesthesia. Recommendation:           - The patient will be observed post-procedure,                            until all discharge criteria are met.                           - Return patient to hospital ward for ongoing care.                           - May stop IV PPI infusion, and remain on twice                            daily PPI.                           -  Continue Carafate twice daily.                           - Aggressive anti-emetic therapy.                           - Trend Hgb/Hct Q6-8H.                           - If patient has significant bloody BMs this                            afternoon, then I would send for CT-AP bleeding                            scan as this could end up being diverticular in                            origin.                           - NPO initially and then proceed with no further                            than CLD, in case Colonoscopy is necessary during                            this hospitalization.                           - Inpatient GI service will pick up VCE in the AM                            and then hopefully be able to read on Monday                            afternoon to determine next steps in                            evaluation/treatment that may need to be considered.                           - The findings and recommendations were discussed                            with the patient.                           - The findings and recommendations were discussed                            with the patient's family.                           - The findings and recommendations were discussed  with the referring physician. Procedure  Code(s):        --- Professional ---                           [id number], Small intestinal endoscopy, enteroscopy                            beyond second portion of duodenum, not including                            ileum; diagnostic, including collection of                            specimen(s) by brushing or washing, when performed                            (separate procedure) Diagnosis Code(s):        --- Professional ---                           K44.9, Diaphragmatic hernia without obstruction or                            gangrene                           K31.89, Other diseases of stomach and duodenum                           K31.7, Polyp of stomach and duodenum                           T18.3XXA, Foreign body in small intestine, initial                            encounter                           K92.1, Melena (includes Hematochezia)                           K31.819, Angiodysplasia of stomach and duodenum                            without bleeding                           K92.0, Hematemesis CPT copyright 2022 [employer]. All rights reserved. The codes documented in this report are preliminary and upon coder review may  be revised to meet current compliance requirements. Justice Britain, MD 07/21/2022 12:39:45 PM Number of Addenda: 0

## 2022-08-02 NOTE — Transfer of Care (Signed)
Immediate Anesthesia Transfer of Care Note  Patient: [name]  Procedure(s) Performed: ENTEROSCOPY GIVENS CAPSULE STUDY  Patient Location: PACU  Anesthesia Type:General  Level of Consciousness: awake  Airway & Oxygen Therapy: Patient Spontanous Breathing and Patient connected to face mask oxygen  Post-op Assessment: Report given to RN and Post -op Vital signs reviewed and stable  Post vital signs: Reviewed and stable  Last Vitals:  Vitals Value Taken Time  BP    Temp    Pulse    Resp    SpO2      Last Pain:  Vitals:   07/09/2022 1124  TempSrc: Tympanic  PainSc: 0-No pain         Complications: No notable events documented.

## 2022-08-02 NOTE — Anesthesia Postprocedure Evaluation (Signed)
Anesthesia Post Note  Patient: [name]  Procedure(s) Performed: ENTEROSCOPY GIVENS CAPSULE STUDY     Patient location during evaluation: Endoscopy Anesthesia Type: General Level of consciousness: awake and alert Pain management: pain level controlled Vital Signs Assessment: post-procedure vital signs reviewed and stable Respiratory status: spontaneous breathing, nonlabored ventilation, respiratory function stable and patient connected to nasal cannula oxygen Cardiovascular status: blood pressure returned to baseline and stable Postop Assessment: no apparent nausea or vomiting Anesthetic complications: no  No notable events documented.  Last Vitals:  Vitals:   07/14/2022 1240 07/21/2022 1255  BP: 128/77 119/81  Pulse:  93  Resp: 18 18  Temp:    SpO2: 100% 92%    Last Pain:  Vitals:   07/13/2022 1230  TempSrc: Temporal  PainSc: 0-No pain                 Barnet Glasgow

## 2022-08-02 NOTE — H&P (View-Only) (Signed)
Gastroenterology Inpatient Consultation   Attending [place] [place] Day: 1  Reason for Consult Recurrent GI bleeding    History of Present Illness  [name] is a 61 y.o. female with a pmh significant for atrial fibrillation (not on anticoagulation), arthritis, DJD, diverticulosis, hiatal hernia, GERD, Schatzki ring, small bowel AVMs.  The GI service is consulted for evaluation and management of hematemesis, acute blood loss anemia, melanic/maroon stools and concern for recurrent GI bleeding.  This patient was recently discharged from the hospital just a few days ago after being hospitalized with hematemesis and acute blood loss anemia.  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  A clip was placed in an area of possible subepithelial lesion versus angulation and this was followed up with a CT scan that did not show any evidence of a vascular lesion within the wall.  The patient had an aspiration pneumonitis develop after her procedure but rapidly improved with antibiotics and was on room air at time of discharge.  She ended up going home feeling somewhat better.  She still had darker stools that were occurring but did not have any recurrent hematemesis.  She completed the antibiotics that were administered her at discharge.  The patient slept and woke up at 6 AM this morning with recurrent abdominal cramping and discomfort.  Subsequently she had vomiting that was noted to be hematemesis and had passage of further dark melanic appearing stool.  Patient came in for further evaluation.  At this point, we are still pending labs at this time as she has just been seen by the emergency department.  She is not having any further nausea but did get one single dose of p.o. Phenergan given by her husband before she left home.  The patient is not taking  any significant nonsteroidals or BC/Goody powders.  There is hesitancy in the setting of the patient's previous aspiration for recurrent procedures.  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   History of vulvar dysplasia    VIN3   Iron deficiency anemia due to chronic blood loss 05/05/2022   Lumbar spondylosis    OA (osteoarthritis)    PMB (postmenopausal bleeding)    Scoliosis    Wears glasses    Past Surgical History:  Procedure Laterality Date   ANTERIOR CERVICAL DECOMP/DISCECTOMY FUSION  11-25-2009  @  [facility]   C4 --- C7   ANTERIOR LAT LUMBAR FUSION Left 10/22/2016   Procedure: Anterior Lateral Lumbar Fusion - Lumbar two - lumbar three, with Lateral plate;  Surgeon: Kary Kos, MD;  Location: [facility];  Service: Neurosurgery;  Laterality: Left;  Anterior Lateral Lumbar Fusion - Lumbar two - lumbar three, with Lateral plate   CARDIAC CATHETERIZATION  age 83   CO2 LASER  APPLICATION N/A 84/[id number]/[id number]   Procedure: CO2 LASER APPLICATION;  Surgeon: Gus Height, MD;  Location: [facility];  Service: Gynecology;  Laterality: N/A;   COLONOSCOPY W/ POLYPECTOMY  06/21/2017   Dr.Perry   ENTEROSCOPY N/A 07/28/2022   Procedure: ENTEROSCOPY;  Surgeon: Rush Landmark Telford Nab., MD;  Location: [facility];  Service: Gastroenterology;  Laterality: N/A;   HEMOSTASIS CLIP PLACEMENT  07/28/2022   Procedure: HEMOSTASIS CLIP PLACEMENT;  Surgeon: Irving Copas., MD;  Location: [facility];  Service: Gastroenterology;;   HOT HEMOSTASIS N/A 07/28/2022   Procedure: HOT HEMOSTASIS (ARGON PLASMA COAGULATION/BICAP);  Surgeon: Irving Copas., MD;  Location: [facility];  Service: Gastroenterology;  Laterality: N/A;   HYSTEROSCOPY WITH D & C N/A 08/27/2020   Procedure: DILATATION AND CURETTAGE /HYSTEROSCOPY;  Surgeon: Vanessa Kick, MD;  Location: [facility];  Service: Gynecology;  Laterality: N/A;   LAPAROSCOPIC CHOLECYSTECTOMY  04/2006   LEG SURGERY Right 1983   femoral artery repair from cardiac cath injury    OPEN REDUCTION INTERNAL FIXATION (ORIF) METACARPAL Right 08/12/2017   Procedure: OPEN REDUCTION INTERNAL FIXATION (ORIF) 5th METACARPAL fracture;  Surgeon: Roseanne Kaufman, MD;  Location: [facility];  Service: Orthopedics;  Laterality: Right;  60 mins   SPINAL FIXATION SURGERY  1973   age 50 - sclosis   SUBMUCOSAL TATTOO INJECTION  07/28/2022   Procedure: SUBMUCOSAL TATTOO INJECTION;  Surgeon: Irving Copas., MD;  Location: [facility];  Service: Gastroenterology;;   TOTAL HIP ARTHROPLASTY Right 06-16-2010 '@[facility]'$    UPPER GASTROINTESTINAL ENDOSCOPY     VSD REPAIR  age 68   VULVECTOMY N/A 09/18/2015   Procedure: WIDE EXCISION VULVECTOMY;  Surgeon: Vanessa Kick, MD;  Location: [facility];  Service: Gynecology;  Laterality: N/A;    Allergies Allergies  Allergen Reactions   Tape Itching and Other (See Comments)    Redness     Family History Family  History  Problem Relation Age of Onset   COPD Mother    Diabetes Father    Pneumonia Father    Alcoholism Father    Diabetes Other    Heart disease Other    Colon cancer Neg Hx    Esophageal cancer Neg Hx    Stomach cancer Neg Hx     Social History Social History   Socioeconomic History   Marital status: Married    Spouse name: Not on file   Number of children: Not on file   Years of education: Not on file   Highest education level: Not on file  Occupational History   Occupation: [employer]  Tobacco Use   Smoking status: Former    Packs/day: 1.00    Years: 20.00    Total pack years: 20.00    Types: Cigarettes    Quit date: 09/07/2002    Years since quitting: 19.9   Smokeless tobacco: Never  Vaping Use   Vaping Use: Never used  Substance and Sexual Activity   Alcohol use: Yes    Comment: occ   Drug use: Never   Sexual activity: Yes    Birth control/protection: Post-menopausal  Other  Topics Concern   Not on file  Social History Narrative   Not on file   Social Determinants of Health   Financial Resource Strain: Not on file  Food Insecurity: No Food Insecurity (07/27/2022)   Hunger Vital Sign    Worried About Running Out of Food in the Last Year: Never true    Ran Out of Food in the Last Year: Never true  Transportation Needs: No Transportation Needs (07/27/2022)   PRAPARE - Hydrologist (Medical): No    Lack of Transportation (Non-Medical): No  Physical Activity: Not on file  Stress: Not on file  Social Connections: Not on file  Intimate Partner Violence: Not At Risk (07/27/2022)   Humiliation, Afraid, Rape, and Kick questionnaire    Fear of Current or Ex-Partner: No    Emotionally Abused: No    Physically Abused: No    Sexually Abused: No    Medications  Home Medications No current facility-administered medications on file prior to encounter.   Current Outpatient Medications on File Prior to Encounter  Medication Sig  Dispense Refill   acetaminophen (TYLENOL) 650 MG CR tablet Take 1,300 mg by mouth 2 (two) times daily as needed for pain.     amoxicillin-clavulanate (AUGMENTIN) 875-125 MG tablet Take 1 tablet by mouth 2 (two) times daily. 10 tablet 0   Collagen-Vitamin C-Biotin (COLLAGEN 1500/C PO) Take 1 capsule by mouth daily.     diltiazem (CARDIZEM CD) 180 MG 24 hr capsule Take 1 capsule (180 mg total) by mouth daily. 90 capsule 3   estrogens-methylTEST 1.25-2.5 MG TABS tablet Take 1 tablet by mouth daily.     ferrous sulfate 325 (65 FE) MG EC tablet Take 1 tablet (325 mg total) by mouth 2 (two) times daily. (Patient taking differently: Take 325 mg by mouth every other day.) 60 tablet 3   HYDROcodone-acetaminophen (NORCO) 7.5-325 MG tablet Take 1 tablet by mouth daily as needed for moderate pain.     medroxyPROGESTERone (PROVERA) 5 MG tablet Take 5 mg by mouth daily.     OVER THE COUNTER MEDICATION Take 2 capsules by mouth daily. Zinc, Vitamin C, and Vitamin D     pantoprazole (PROTONIX) 40 MG tablet Take 1 tablet (40 mg total) by mouth 2 (two) times daily. 90 tablet 3   sucralfate (CARAFATE) 1 g tablet Take 1 tablet (1 g total) by mouth 2 (two) times daily for 14 days. 28 tablet 0   traMADol (ULTRAM) 50 MG tablet Take 100 mg by mouth 2 (two) times daily.     Scheduled Inpatient Medications  metoCLOPramide (REGLAN) injection  10 mg Intravenous Once   Continuous Inpatient Infusions  PRN Inpatient Medications   Physical Examination  BP 114/67 (BP Location: Right Arm)   Pulse (!) 101   Temp 98.3 F (36.8 C) (Oral)   Resp 15   SpO2 95%  GEN: NAD, appears stated age, doesn't appear chronically ill, husband at bedside PSYCH: Cooperative, without pressured speech EYE: Conjunctivae pale-pink ENT: MMM CV: Mildly tachycardic RESP: No audible wheezing, slightly decreased breath sounds at the left base GI: NABS, soft, protuberant abdomen, distended, nontender, no rebound or guarding NT/ND, without  rebound or guarding, no HSM appreciated GU: DRE not performed (I saw pictures of melanic appearing stool and coffee-ground emesis on patient's phone) MSK/EXT: Trace bilateral pedal edema SKIN: No jaundice NEURO:  Alert & Oriented x 3, no focal deficits   Review of Data  I reviewed the following data at  the time of this encounter:  Laboratory Studies   Recent Labs  Lab 07/29/22 0356  NA 138  K 3.6  CL 102  CO2 30  BUN 11  CREATININE 0.73  GLUCOSE 105*  CALCIUM 8.2*   Recent Labs  Lab 07/29/22 0356  AST 21  ALT 29  ALKPHOS 43    Recent Labs  Lab 07/27/22 0831 07/27/22 2103 07/28/22 0353 07/29/22 0356  WBC 11.0*  --  7.3 15.6*  HGB 12.8   < > 10.8* 10.6*  HCT 39.9   < > 33.7* 32.9*  PLT 303  --  258 253   < > = values in this interval not displayed.   Recent Labs  Lab 07/27/22 0930  INR 1.1   Imaging Studies  11/22 CXR  IMPRESSION: Patchy airspace disease in the parahilar left lung compatible with pneumonia. Follow-up recommended to ensure resolution.  11/22 CTAP IMPRESSION: 1. No lesion identified in the duodenum at the site the vascular clips (third portion duodenum). 2. No small-bowel lesion. 3. Mild LEFT colon diverticulosis. 4. LEFT lower lobe PNEUMONIA.  GI Procedures and Studies  SBE - No gross lesions in the entire esophagus. Z-line regular, 37 cm from the incisors. Nonobstructing Schatzki ring. - 4 cm hiatal hernia. - Multiple gastric polyps - fundic gland in appearance. - No other gross lesions in the entire stomach. - Subepithelial duodenal medium-sized nodule (vs significant angulation deformity) with a AVM and small erosion noted. Difficult to get positioning with the pediatric colonoscope due to looping within the stomach to this area. Treated with argon plasma coagulation (APC), then 2 clips (MR conditional) were placed for demarcation purposes/location purposes. - A single non-bleeding angioectasia in the duodenum 4 region. Treated  with argon plasma coagulation (APC). - Normal mucosa was found in the visualized proximal jejunum. Tattooed distal extent of today's procedure.   Assessment  Ms. Pinkham is a 62 y.o. female with a pmh significant for atrial fibrillation (not on anticoagulation), arthritis, DJD, diverticulosis, hiatal hernia, GERD, Schatzki ring, small bowel AVMs.  The GI service is consulted for evaluation and management of hematemesis, acute blood loss anemia, melanic/maroon stools and concern for recurrent GI bleeding.  The patient is hemodynamically stable.  Unfortunately she has represented with what appears to be upper GI bleeding in the setting of coffee-ground emesis/hematemesis and dark stools persisting.  She has none of her labs back as of yet as she is just coming through the door but I am concerned that she will likely need repeat endoscopic evaluation.  What I found at the time of her last enteroscopy suggested a potential subepithelial lesion but imaging did not show any abnormalities in that particular area.  I think she needs a repeat upper enteroscopy to reevaluate the sites that I worked on and if we do not find a source of potential active bleeding in that upper GI tract then we can release the capsule study at that time.  I am going to give her a dose of IV Reglan in possible anticipation of a procedure later this morning or early afternoon depending on patient's labs and anesthesia availability.  She has had aspiration pneumonitis with her most recent enteroscopy so I think her procedure should be done with anesthesia assistance.  Because of the aspiration pneumonitis even though she satting 99 to 100% on room air I do want to just get some updated chest x-ray in case our anesthesia colleagues were to want to consider ETT placement for her, since she  has had "prior aspirations with other procedures in the past".  I will talk with our anesthesia colleagues about potentially a procedure later today, but  again this will be based on availability of our anesthesia colleagues and also seeing what her labs look like this morning.  I am writing her for IV Protonix drip to be initiated.  It is not clear to me that this is overtly colonic in source right now since she has had coffee-ground emesis/hematemesis but if we perform her procedures today or tomorrow and we do not find another source of bleeding will need to keep that in mind but hopefully we can find a source and treat that soon.  The risks and benefits of endoscopic evaluation were discussed with the patient; these include but are not limited to the risk of perforation, infection, bleeding, missed lesions, lack of diagnosis, severe illness requiring hospitalization, as well as anesthesia and sedation related illnesses.  The patient and/or family is agreeable to proceed.  All patient questions were answered to the best of my ability, and the patient agrees to the aforementioned plan of action with follow-up as indicated.   Plan/Recommendations  Maintain n.p.o. status Follow-up labs that were drawn stat this morning including CBC and type and screen and CMP Follow-up chest x-ray PA/lateral ordered by ED to follow-up recent pneumonitis IV Protonix drip ordered Place 2 peripheral IVs in this patient at all times (order placed) Potential enteroscopy +/- VCE today versus tomorrow with anesthesia assistance   Thank you for this consult.  We will continue to follow.  Please page/call with questions or concerns.   Justice Britain, MD [facility] Office # [phone]

## 2022-08-02 NOTE — ED Triage Notes (Signed)
Reports hematemesis and bloody stools, 5-6x this morning, large amount. Was just admitted and discharged for same.

## 2022-08-02 NOTE — Interval H&P Note (Signed)
History and Physical Interval Note:  07/20/2022 10:47 AM  [name]  has presented today for surgery, with the diagnosis of Hematemesis, small bowel AVMs.  The various methods of treatment have been discussed with the patient and family. After consideration of risks, benefits and other options for treatment, the patient has consented to  Procedure(s): ENTEROSCOPY (N/A) GIVENS CAPSULE STUDY (N/A) as a surgical intervention.  The patient's history has been reviewed, patient examined, no change in status, stable for surgery.  I have reviewed the patient's chart and labs.  Questions were answered to the patient's satisfaction.     [employer]

## 2022-08-03 ENCOUNTER — Encounter (HOSPITAL_COMMUNITY): Payer: Self-pay | Admitting: Gastroenterology

## 2022-08-03 ENCOUNTER — Encounter: Payer: Self-pay | Admitting: Hematology

## 2022-08-03 DIAGNOSIS — K219 Gastro-esophageal reflux disease without esophagitis: Secondary | ICD-10-CM

## 2022-08-03 DIAGNOSIS — M1991 Primary osteoarthritis, unspecified site: Secondary | ICD-10-CM

## 2022-08-03 DIAGNOSIS — K921 Melena: Secondary | ICD-10-CM

## 2022-08-03 DIAGNOSIS — M48061 Spinal stenosis, lumbar region without neurogenic claudication: Secondary | ICD-10-CM

## 2022-08-03 DIAGNOSIS — K920 Hematemesis: Secondary | ICD-10-CM

## 2022-08-03 DIAGNOSIS — E669 Obesity, unspecified: Secondary | ICD-10-CM | POA: Diagnosis not present

## 2022-08-03 DIAGNOSIS — K922 Gastrointestinal hemorrhage, unspecified: Secondary | ICD-10-CM | POA: Diagnosis not present

## 2022-08-03 DIAGNOSIS — D62 Acute posthemorrhagic anemia: Secondary | ICD-10-CM

## 2022-08-03 DIAGNOSIS — I480 Paroxysmal atrial fibrillation: Secondary | ICD-10-CM

## 2022-08-03 LAB — CBC
HCT: 28.1 % — ABNORMAL LOW (ref 36.0–46.0)
Hemoglobin: 9 g/dL — ABNORMAL LOW (ref 12.0–15.0)
MCH: 31.3 pg (ref 26.0–34.0)
MCHC: 32 g/dL (ref 30.0–36.0)
MCV: 97.6 fL (ref 80.0–100.0)
Platelets: 312 10*3/uL (ref 150–400)
RBC: 2.88 MIL/uL — ABNORMAL LOW (ref 3.87–5.11)
RDW: 15.5 % (ref 11.5–15.5)
WBC: 8.1 10*3/uL (ref 4.0–10.5)
nRBC: 0 % (ref 0.0–0.2)

## 2022-08-03 LAB — COMPREHENSIVE METABOLIC PANEL
ALT: 14 U/L (ref 0–44)
AST: 14 U/L — ABNORMAL LOW (ref 15–41)
Albumin: 2.9 g/dL — ABNORMAL LOW (ref 3.5–5.0)
Alkaline Phosphatase: 37 U/L — ABNORMAL LOW (ref 38–126)
Anion gap: 8 (ref 5–15)
BUN: 9 mg/dL (ref 8–23)
CO2: 27 mmol/L (ref 22–32)
Calcium: 8.5 mg/dL — ABNORMAL LOW (ref 8.9–10.3)
Chloride: 106 mmol/L (ref 98–111)
Creatinine, Ser: 0.79 mg/dL (ref 0.44–1.00)
GFR, Estimated: >60 mL/min (ref >60)
Glucose, Bld: 86 mg/dL (ref 70–99)
Potassium: 3.6 mmol/L (ref 3.5–5.1)
Sodium: 141 mmol/L (ref 135–145)
Total Bilirubin: 0.6 mg/dL (ref 0.3–1.2)
Total Protein: 5.6 g/dL — ABNORMAL LOW (ref 6.5–8.1)

## 2022-08-03 LAB — HEMOGLOBIN AND HEMATOCRIT, BLOOD
HCT: 28.7 % — ABNORMAL LOW (ref 36.0–46.0)
HCT: 34.2 % — ABNORMAL LOW (ref 36.0–46.0)
HCT: 29.4 % — ABNORMAL LOW (ref 36.0–46.0)
HCT: 33.7 % — ABNORMAL LOW (ref 36.0–46.0)
HCT: 32.3 % — ABNORMAL LOW (ref 36.0–46.0)
Hemoglobin: 9.1 g/dL — ABNORMAL LOW (ref 12.0–15.0)
Hemoglobin: 10.6 g/dL — ABNORMAL LOW (ref 12.0–15.0)
Hemoglobin: 9.4 g/dL — ABNORMAL LOW (ref 12.0–15.0)
Hemoglobin: 10.6 g/dL — ABNORMAL LOW (ref 12.0–15.0)
Hemoglobin: 10.3 g/dL — ABNORMAL LOW (ref 12.0–15.0)

## 2022-08-03 LAB — GLUCOSE, CAPILLARY: Glucose-Capillary: 124 mg/dL — ABNORMAL HIGH (ref 70–99)

## 2022-08-03 MED ORDER — ACETAMINOPHEN 325 MG PO TABS
650.0000 mg | ORAL_TABLET | Freq: Three times a day (TID) | ORAL | Status: DC | PRN
  Administered 2022-08-03: 650 mg via ORAL
  Filled 2022-08-03: qty 2

## 2022-08-03 MED ORDER — MEDROXYPROGESTERONE ACETATE 2.5 MG PO TABS
5.0000 mg | ORAL_TABLET | Freq: Every day | ORAL | Status: DC
  Administered 2022-08-04 – 2022-08-09 (×3): 5 mg via ORAL
  Filled 2022-08-03 (×8): qty 2

## 2022-08-03 MED ORDER — TRAMADOL HCL 50 MG PO TABS
50.0000 mg | ORAL_TABLET | Freq: Three times a day (TID) | ORAL | Status: DC | PRN
  Administered 2022-08-04: 50 mg via ORAL
  Filled 2022-08-03: qty 1

## 2022-08-03 MED ORDER — PEG-KCL-NACL-NASULF-NA ASC-C 100 G PO SOLR: 0.5000 | Freq: Once | ORAL | Status: DC

## 2022-08-03 MED ORDER — PEG-KCL-NACL-NASULF-NA ASC-C 100 G PO SOLR: 1.0000 | Freq: Once | ORAL | Status: DC

## 2022-08-03 MED ORDER — EST ESTROGENS-METHYLTEST 1.25-2.5 MG PO TABS
1.0000 | ORAL_TABLET | Freq: Every day | ORAL | Status: DC
  Administered 2022-08-09: 1 via ORAL

## 2022-08-03 MED ORDER — PEG-KCL-NACL-NASULF-NA ASC-C 100 G PO SOLR
0.5000 | Freq: Once | ORAL | Status: AC
  Administered 2022-08-03: 100 g via ORAL
  Filled 2022-08-03: qty 1

## 2022-08-03 MED ORDER — DILTIAZEM HCL 30 MG PO TABS
30.0000 mg | ORAL_TABLET | Freq: Three times a day (TID) | ORAL | Status: DC
  Administered 2022-08-03 – 2022-08-04 (×2): 30 mg via ORAL
  Filled 2022-08-03 (×2): qty 1

## 2022-08-03 MED ORDER — ACETAMINOPHEN ER 650 MG PO TBCR: 650.0000 mg | EXTENDED_RELEASE_TABLET | Freq: Three times a day (TID) | ORAL | Status: DC | PRN

## 2022-08-03 NOTE — Progress Notes (Signed)
Rapid Response Event Note   Reason for Call : unresponsive   Initial Focused Assessment:  On arrival, pt RN, Pt husband, and the pt was in the bathroom. Pt on the toilet. When I arrived pt was actively having a loose bloody BM. Pt RN describes the event as pt became stiff, stopped responding verbally. Pt says she just felt lightheaded prior to the event and was actively pooping.  No postictal time per pt RN, I believe the event was syncopal vs vagal response related to the BM.  Prior to event, pt finished one bottle of coloprep.  During the    Interventions:  STAT H&H ordered Hgb at 1801 was 10.6 and the repeat after the syncopal episode was 10.3  Plan of Care:     Event Summary:   MD Notified:  Call Time: Arrival Time: End Time:  Quincy Simmonds, RN

## 2022-08-03 NOTE — Progress Notes (Addendum)
Patient ID: [name], female   DOB: 1961/02/03, 61 y.o.   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 as well as a single distal duodenal AVM that was ablated  Small bowel enteroscopy yesterday-5 cm hiatal hernia gastritis consistent with retch gastropathy, multiple small sessile gastric polyps previously noted to be fundic gland polyps-2 erosions in the third portion of the duodenum and fourth portion of the duodenum corresponding with the prior APC sites 1 Hemoclip noted in the third portion, no active bleeding normal mucosa in the proximal jejunum to the previously placed tattoo in the jejunum Capsule placed endoscopically  Labs today hemoglobin 9.0 hematocrit 28.1 stable  Patient did have a maroonish dark stool today  Capsule endoscopy interpreted today-no active bleeding in the proximal small bowel, there is a faint trace of heme at the Endo Clip site, and also noted 1 AVM more distal to the Endo Clip at 1 hour 17 minutes which is about 1 hour and 16 minutes beyond the first duodenal image this AVM was not actively bleeding.  Remainder of visualized small bowel unremarkable to about 6 hours 10 minutes at which point there is melena which continues throughout the remainder of the study and into the colon   Objective   Vital signs in last 24 hours: Temp:  [98.6 F (37 C)-99.9 F (37.7 C)] 98.6 F (37 C) (11/27 0352) Pulse Rate:  [89-106] 89 (11/27 0352) Resp:  [14-19] 18 (11/27 0352) BP: (107-133)/(66-81) 118/75 (11/27 0352) SpO2:  [92 %-100 %] 95 % (11/27 0352) Weight:  [88.6 kg] 88.6 kg (11/26 1124) Last BM Date : 07/13/2022 General:    white female in NAD Heart:  Regular rate and rhythm; no murmurs Lungs: Respirations even and unlabored, lungs CTA bilaterally Abdomen:  Soft, nontender and  nondistended. Normal bowel sounds. Extremities:  Without edema. Neurologic:  Alert and oriented,  grossly normal neurologically. Psych:  Cooperative. Normal mood and affect.  Intake/Output from previous day: 11/26 0701 - 11/27 0700 In: 1471.1 [P.O.:460; I.V.:911.1; IV Piggyback:100] Out: 2 [Urine:2] Intake/Output this shift: Total I/O In: 300 [P.O.:300] Out: -   Lab Results: Recent Labs    07/26/2022 0845 07/26/2022 1341 07/22/2022 1929 08/03/22 0108 08/03/22 0828  WBC 13.5*  --   --  8.1  --   HGB 10.9*   < > 9.4* 9.0*  9.1* 10.6*  HCT 33.7*   < > 29.6* 28.1*  28.7* 34.2*  PLT 334  --   --  312  --    < > = values in this interval not displayed.   BMET Recent Labs    08/03/2022 0845 08/03/22 0108  NA 142 141  K 4.0 3.6  CL 106 106  CO2 26 27  GLUCOSE 104* 86  BUN 10 9  CREATININE 0.72 0.79  CALCIUM 8.9 8.5*   LFT Recent Labs    08/03/22 0108  PROT 5.6*  ALBUMIN 2.9*  AST 14*  ALT 14  ALKPHOS 37*  BILITOT 0.6   PT/INR Recent Labs    07/12/2022 0845  LABPROT 13.7  INR 1.1    Studies/Results: CT ANGIO GI BLEED  Result Date: 07/24/2022 CLINICAL DATA:  Rectal bleeding EXAM: CTA ABDOMEN AND PELVIS WITHOUT AND WITH CONTRAST TECHNIQUE: Multidetector CT imaging of the abdomen and pelvis was performed  using the standard protocol during bolus administration of intravenous contrast. Multiplanar reconstructed images and MIPs were obtained and reviewed to evaluate the vascular anatomy. RADIATION DOSE REDUCTION: This exam was performed according to the departmental dose-optimization program which includes automated exposure control, adjustment of the mA and/or kV according to patient size and/or use of iterative reconstruction technique. CONTRAST:  164m OMNIPAQUE IOHEXOL 350 MG/ML SOLN COMPARISON:  None Available. FINDINGS: VASCULAR No evidence of active GI bleed. NON-VASCULAR Lower chest: Near-complete resolution of left lower lobe consolidation. Small hiatal hernia.  Cardiomegaly. Hepatobiliary: No focal liver abnormality is seen. Status post cholecystectomy. No biliary dilatation. Pancreas: Unremarkable. No pancreatic ductal dilatation or surrounding inflammatory changes. Spleen: Normal in size without focal abnormality. Adrenals/Urinary Tract: Bilateral adrenal glands are unremarkable. Small bilateral parapelvic cysts. Bladder is unremarkable. Stomach/Bowel: Stomach is within normal limits. Mild diverticulosis no evidence of bowel wall thickening, distention, or inflammatory changes. Lymphatic: Aortic atherosclerosis. Patent right aortoiliac bypass graft. No enlarged abdominal or pelvic lymph nodes. Reproductive: Uterus and bilateral adnexa are unremarkable. Other: No abdominal wall hernia or abnormality. No abdominopelvic ascites. Musculoskeletal: Prior right hip replacement and lumbar spine fusion. No aggressive appearing osseous lesions IMPRESSION: 1. No evidence of active GI bleed. 2. No acute abnormalities in the abdomen or pelvis. 3. Near-complete resolution of left lower lobe consolidation, compatible with resolving pneumonia Electronically Signed   By: LYetta GlassmanM.D.   On: 07/18/2022 19:45   DG Chest 2 View  Result Date: 07/25/2022 CLINICAL DATA:  Aspiration. EXAM: CHEST - 2 VIEW COMPARISON:  July 29, 2022. FINDINGS: Stable cardiomediastinal silhouette. Right lung is clear. Significantly improved left lung opacity is noted. Bony thorax is unremarkable. IMPRESSION: Significantly improved left lung opacity. Electronically Signed   By: JMarijo ConceptionM.D.   On: 07/24/2022 09:32       Assessment / Plan:    [id number] old white female with recurrent GI bleeding, admitted with acute blood loss anemia, hematemesis and history of small bowel AVMs.  She had a very recent admission with bleeding at that time was found to have a duodenal AVM with erosion and then just distal to this another duodenal AVM both of which were ablated, Hemoclip was placed to an  area of possible subepithelial lesion. Repeat small bowel endoscopy yesterday finding of 2 erosions in the third and fourth portion of the duodenum from prior APC sites, otherwise negative and capsule was placed endoscopically  Hemoglobin has remained stable overnight, she did have 1 dark maroonish stool early this morning  Capsule endoscopy does not show any active bleeding in the small bowel though there is melena in the distal small bowel continuing into the colon no lesions visible Trace heme at the Endo Clip site but no active bleeding, and 1 additional AVM was noted in the small bowel not bleeding  #2 anemia secondary to acute blood loss stable today #3 history of atrial fibrillation-not on anticoagulation  Plan; clear liquid diet and n.p.o. after midnight Bowel prep this evening Plan for colonoscopy tomorrow afternoon with intubation of the terminal ileum (probably needs general anesthesia, had recent aspiration) Continue to follow hemoglobin closely and transfuse as indicated   Principal Problem:   GIB (gastrointestinal bleeding) Active Problems:   GERD (gastroesophageal reflux disease)   Hematemesis with nausea   [place]   Acute blood loss anemia   Dark stools     LOS: 1 day   Amy Esterwood PA-C 08/03/2022, 11:10 AM     Attending physician's  note   I have taken history, reviewed the chart and examined the patient. I performed a substantive portion of this encounter, including complete performance of at least one of the key components, in conjunction with the APP. I agree with the Advanced Practitioner's note, impression and recommendations.  Signout received from Dr. Rush Landmark.  VCE: SB source of bleeding beyond reach of push enteroscopy. Likely in mid/distal SB. Also noted SB AVM.   Pt with continued dark stool this AM Hb stable 9-10 range. HD stable Neg CTA  Plan: -Prep for colon with TI intubation to eval distal SB source of bleed.   -If neg, consider single balloon enteroscopy by Dr. Bryan Lemma. -If still with no clear etiology/source, double-balloon enteroscopy (DBE) at [facility] as outpt.  Intra-Op enteroscopy as a last resort if continued problems, also at [facility]. -If any brisk bleeding, repeat CTA with special attention to SMA -Consider LAR octreotide, if continued bleeding. -Continue estrogens.  D/W Dr Rush Landmark, Dr Henrene Pastor.  I have discussed with the patient and patient's family the procedure, limitations, potential risks and benefits. Pt wants GA as she had aspiration with prior endoscopic procedures.  I will discuss with anesthesia tomorrow.     Carmell Austria, MD [facility] [phone]

## 2022-08-03 NOTE — Progress Notes (Signed)
PROGRESS NOTE  [name] KZS:[id number] DOB: [birth year]   PCP: Redmond School, MD  Patient is from: Home.  Lives with husband.  Independently ambulates at baseline.  DOA: 07/26/2022 LOS: 1  Chief complaints Chief Complaint  Patient presents with   GI Bleeding     Brief Narrative / Interim history: 61 year old F with PMH of paroxysmal A-fib not on AC, IDA, osteoarthritis, GERD, GIB and duodenal AVM/telangiectasia presenting with multiple episodes of hematemesis and rectal bleed, and admitted due to GI bleed.  Vital stable except for mild tachycardia.  Hgb 10.9 (10.6 on 07/29/2022 when she was discharged from the hospital).  Hemoccult positive.  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  Capsule placed.  Started on clear liquid diet.  GI following.   Subjective: Seen and examined earlier this morning.  Reports having bloody bowel movements with small dark blood clots overnight.  No other complaints.  Denies abdominal pain, nausea, vomiting, dizziness, chest pain or shortness of breath.  Objective: Vitals:   07/25/2022 1504 07/09/2022 2012 07/21/2022 2352 08/03/22 0352  BP: 128/67 118/68 111/69 118/75  Pulse: 98 (!) 106 97 89  Resp:  '18 18 18  '$ Temp: 98.9 F (37.2 C) 99.4 F (37.4 C) 98.7 F (37.1 C) 98.6 F (37 C)  TempSrc: Oral Oral Oral Oral  SpO2: 99% 95% 95% 95%  Weight:      Height:        Examination:  GENERAL: No apparent distress.  Nontoxic. HEENT: MMM.  Vision and hearing grossly intact.  NECK: Supple.  No apparent JVD.  RESP:  No IWOB.  Fair aeration bilaterally. CVS:  RRR. Heart sounds normal.  ABD/GI/GU: BS+. Abd soft, NTND.  MSK/EXT:  Moves extremities. No apparent deformity. No edema.  SKIN: no apparent skin lesion or wound NEURO: Awake, alert and oriented appropriately.  No apparent focal neuro deficit. PSYCH: Calm. Normal affect.    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  Capsule placed.  Microbiology summarized: None  Assessment and plan: Principal Problem:   GIB (gastrointestinal bleeding) Active Problems:   Osteoarthritis   GERD (gastroesophageal reflux disease)   Spinal stenosis   Hematemesis with nausea   Paroxysmal atrial fibrillation (HCC)   Class II obesity   Acute blood loss anemia   Dark stools  Acute GI bleed/reactive gastropathy-presents with hematemesis and rectal bleed.  H&H at baseline.  Not on blood thinners or NSAID.  Hemodynamically stable.  EGD as above.  She had iron infusion previous hospitalization. Recent Labs    07/09/22 1120 07/27/22 0831 07/27/22 2103 07/28/22 0353 07/29/22 0356 07/09/2022 0845 08/05/2022 1341 07/27/2022 1929 08/03/22 0108 08/03/22 0828  HGB 15.4* 12.8 11.2* 10.8* 10.6* 10.9* 10.8* 9.4* 9.0*  9.1* 10.6*  -Monitor H&H every 6 hours -Continue IV Protonix -Follow-up capsule endoscopy result -On CLD per GI -Verbally consented for blood transfusion if indicated -Follow further GI recommendation  Paroxysmal A-fib: In A-fib but rate controlled.  Not on anticoagulation due to GI bleed -Resume Cardizem at 30 mg every 8 hours for now  Osteoarthritis/spinal stenosis/low back pain: Stable -Tylenol as needed -Resume home tramadol  Class II obesity Body mass index is 35.73 kg/m.   DVT prophylaxis:  SCDs Start: 07/27/2022 1331  Code Status: Full code Family Communication: Updated patient's husband at bedside Level of  care: Telemetry Status is: Inpatient Remains inpatient appropriate because: Due to acute GI bleed   Final disposition: Likely home once medically cleared Consultants:  Gastroenterology  Sch Meds:  Scheduled Meds:  diltiazem  30 mg Oral Q8H   estrogens-methylTEST  1 tablet Oral Daily   medroxyPROGESTERone  5 mg Oral  Daily   scopolamine  1 patch Transdermal Q72H   Continuous Infusions:  pantoprazole 8 mg/hr (08/03/22 0151)   PRN Meds:.prochlorperazine, traMADol  Antimicrobials: Anti-infectives (From admission, onward)    None        I have personally reviewed the following labs and images: CBC: Recent Labs  Lab 07/28/22 0353 07/29/22 0356 08/05/2022 0845 07/30/2022 1341 07/11/2022 1929 08/03/22 0108 08/03/22 0828  WBC 7.3 15.6* 13.5*  --   --  8.1  --   NEUTROABS  --   --  10.8*  --   --   --   --   HGB 10.8* 10.6* 10.9* 10.8* 9.4* 9.0*  9.1* 10.6*  HCT 33.7* 32.9* 33.7* 33.9* 29.6* 28.1*  28.7* 34.2*  MCV 94.9 95.1 97.7  --   --  97.6  --   PLT 258 253 334  --   --  312  --    BMP &GFR Recent Labs  Lab 07/28/22 0353 07/29/22 0356 07/27/2022 0845 08/03/22 0108  NA 138 138 142 141  K 4.1 3.6 4.0 3.6  CL 105 102 106 106  CO2 '28 30 26 27  '$ GLUCOSE 96 105* 104* 86  BUN '12 11 10 9  '$ CREATININE 0.71 0.73 0.72 0.79  CALCIUM 8.5* 8.2* 8.9 8.5*   Estimated Creatinine Clearance: 75.4 mL/min (by C-G formula based on SCr of 0.79 mg/dL). Liver & Pancreas: Recent Labs  Lab 07/28/22 0353 07/29/22 0356 07/22/2022 0845 08/03/22 0108  AST '18 21 22 '$ 14*  ALT '27 29 18 14  '$ ALKPHOS 41 43 41 37*  BILITOT 1.0 1.5* 1.0 0.6  PROT 5.4* 5.2* 6.6 5.6*  ALBUMIN 3.0* 2.8* 3.3* 2.9*   No results for input(s): "LIPASE", "AMYLASE" in the last 168 hours. No results for input(s): "AMMONIA" in the last 168 hours. Diabetic: No results for input(s): "HGBA1C" in the last 72 hours. No results for input(s): "GLUCAP" in the last 168 hours. Cardiac Enzymes: No results for input(s): "CKTOTAL", "CKMB", "CKMBINDEX", "TROPONINI" in the last 168 hours. Recent Labs    03/24/22 1110  PROBNP 1,025*   Coagulation Profile: Recent Labs  Lab 08/01/2022 0845  INR 1.1   Thyroid Function Tests: No results for input(s): "TSH", "T4TOTAL", "FREET4", "T3FREE", "THYROIDAB" in the last 72 hours. Lipid Profile: No  results for input(s): "CHOL", "HDL", "LDLCALC", "TRIG", "CHOLHDL", "LDLDIRECT" in the last 72 hours. Anemia Panel: No results for input(s): "VITAMINB12", "FOLATE", "FERRITIN", "TIBC", "IRON", "RETICCTPCT" in the last 72 hours. Urine analysis:    Component Value Date/Time   COLORURINE YELLOW 09/03/2012 1135   APPEARANCEUR CLEAR 09/03/2012 1135   LABSPEC >1.030 (H) 09/03/2012 1135   PHURINE 5.5 09/03/2012 1135   GLUCOSEU NEGATIVE 09/03/2012 1135   HGBUR SMALL (A) 09/03/2012 1135   BILIRUBINUR negative 06/15/2022 1717   KETONESUR moderate (40) (A) 06/15/2022 1717   KETONESUR NEGATIVE 09/03/2012 1135   PROTEINUR =30 (A) 06/15/2022 1717   PROTEINUR NEGATIVE 09/03/2012 1135   UROBILINOGEN 1.0 06/15/2022 1717   UROBILINOGEN 0.2 09/03/2012 1135   NITRITE Positive (A) 06/15/2022 1717   NITRITE NEGATIVE 09/03/2012 1135   LEUKOCYTESUR Trace (A) 06/15/2022 1717   Sepsis Labs: Invalid input(s): "PROCALCITONIN", "LACTICIDVEN"  Microbiology:  No results found for this or any previous visit (from the past 240 hour(s)).  Radiology Studies: CT ANGIO GI BLEED  Result Date: 07/19/2022 CLINICAL DATA:  Rectal bleeding EXAM: CTA ABDOMEN AND PELVIS WITHOUT AND WITH CONTRAST TECHNIQUE: Multidetector CT imaging of the abdomen and pelvis was performed using the standard protocol during bolus administration of intravenous contrast. Multiplanar reconstructed images and MIPs were obtained and reviewed to evaluate the vascular anatomy. RADIATION DOSE REDUCTION: This exam was performed according to the departmental dose-optimization program which includes automated exposure control, adjustment of the mA and/or kV according to patient size and/or use of iterative reconstruction technique. CONTRAST:  11m OMNIPAQUE IOHEXOL 350 MG/ML SOLN COMPARISON:  None Available. FINDINGS: VASCULAR No evidence of active GI bleed. NON-VASCULAR Lower chest: Near-complete resolution of left lower lobe consolidation. Small hiatal  hernia. Cardiomegaly. Hepatobiliary: No focal liver abnormality is seen. Status post cholecystectomy. No biliary dilatation. Pancreas: Unremarkable. No pancreatic ductal dilatation or surrounding inflammatory changes. Spleen: Normal in size without focal abnormality. Adrenals/Urinary Tract: Bilateral adrenal glands are unremarkable. Small bilateral parapelvic cysts. Bladder is unremarkable. Stomach/Bowel: Stomach is within normal limits. Mild diverticulosis no evidence of bowel wall thickening, distention, or inflammatory changes. Lymphatic: Aortic atherosclerosis. Patent right aortoiliac bypass graft. No enlarged abdominal or pelvic lymph nodes. Reproductive: Uterus and bilateral adnexa are unremarkable. Other: No abdominal wall hernia or abnormality. No abdominopelvic ascites. Musculoskeletal: Prior right hip replacement and lumbar spine fusion. No aggressive appearing osseous lesions IMPRESSION: 1. No evidence of active GI bleed. 2. No acute abnormalities in the abdomen or pelvis. 3. Near-complete resolution of left lower lobe consolidation, compatible with resolving pneumonia Electronically Signed   By: LYetta GlassmanM.D.   On: 07/25/2022 19:45      Kelisha Dall T. GNew Site If 7PM-7AM, please contact night-coverage [email or web address] 08/03/2022, 12:19 PM

## 2022-08-03 NOTE — Progress Notes (Signed)
  Transition of Care [facility]) Screening Note   Patient Details  Name: [name] Date of Birth: 07/29/1961   Transition of Care [facility]) CM/SW Contact:    Vassie Moselle, [place] Phone Number: 08/03/2022, 8:49 AM    Transition of Care Department [facility]) has reviewed patient and no TOC needs have been identified at this time. We will continue to monitor patient advancement through interdisciplinary progression rounds. If new patient transition needs arise, please place a TOC consult.

## 2022-08-04 ENCOUNTER — Inpatient Hospital Stay (HOSPITAL_COMMUNITY): Payer: PRIVATE HEALTH INSURANCE | Admitting: Certified Registered Nurse Anesthetist

## 2022-08-04 ENCOUNTER — Encounter (HOSPITAL_COMMUNITY): Payer: Self-pay | Admitting: Internal Medicine

## 2022-08-04 ENCOUNTER — Encounter: Payer: Self-pay | Admitting: Internal Medicine

## 2022-08-04 ENCOUNTER — Encounter (HOSPITAL_COMMUNITY): Admission: EM | Disposition: E | Payer: Self-pay | Source: Home / Self Care | Attending: Internal Medicine

## 2022-08-04 ENCOUNTER — Inpatient Hospital Stay (HOSPITAL_COMMUNITY): Payer: PRIVATE HEALTH INSURANCE

## 2022-08-04 DIAGNOSIS — I4891 Unspecified atrial fibrillation: Secondary | ICD-10-CM

## 2022-08-04 DIAGNOSIS — R55 Syncope and collapse: Secondary | ICD-10-CM

## 2022-08-04 DIAGNOSIS — D649 Anemia, unspecified: Secondary | ICD-10-CM

## 2022-08-04 DIAGNOSIS — M48061 Spinal stenosis, lumbar region without neurogenic claudication: Secondary | ICD-10-CM | POA: Diagnosis not present

## 2022-08-04 DIAGNOSIS — K922 Gastrointestinal hemorrhage, unspecified: Secondary | ICD-10-CM | POA: Diagnosis not present

## 2022-08-04 DIAGNOSIS — E669 Obesity, unspecified: Secondary | ICD-10-CM | POA: Diagnosis not present

## 2022-08-04 DIAGNOSIS — K5731 Diverticulosis of large intestine without perforation or abscess with bleeding: Secondary | ICD-10-CM

## 2022-08-04 DIAGNOSIS — I480 Paroxysmal atrial fibrillation: Secondary | ICD-10-CM | POA: Diagnosis not present

## 2022-08-04 DIAGNOSIS — Z87891 Personal history of nicotine dependence: Secondary | ICD-10-CM

## 2022-08-04 HISTORY — PX: COLONOSCOPY WITH PROPOFOL: SHX5780

## 2022-08-04 LAB — CBC
HCT: 22.7 % — ABNORMAL LOW (ref 36.0–46.0)
Hemoglobin: 7.1 g/dL — ABNORMAL LOW (ref 12.0–15.0)
MCH: 31.4 pg (ref 26.0–34.0)
MCHC: 31.3 g/dL (ref 30.0–36.0)
MCV: 100.4 fL — ABNORMAL HIGH (ref 80.0–100.0)
Platelets: 339 10*3/uL (ref 150–400)
RBC: 2.26 MIL/uL — ABNORMAL LOW (ref 3.87–5.11)
RDW: 15.7 % — ABNORMAL HIGH (ref 11.5–15.5)
WBC: 13.8 10*3/uL — ABNORMAL HIGH (ref 4.0–10.5)
nRBC: 0 % (ref 0.0–0.2)

## 2022-08-04 LAB — PREPARE RBC (CROSSMATCH)

## 2022-08-04 LAB — HEMOGLOBIN AND HEMATOCRIT, BLOOD
HCT: 28.5 % — ABNORMAL LOW (ref 36.0–46.0)
HCT: 27.8 % — ABNORMAL LOW (ref 36.0–46.0)
HCT: 32.4 % — ABNORMAL LOW (ref 36.0–46.0)
Hemoglobin: 8.9 g/dL — ABNORMAL LOW (ref 12.0–15.0)
Hemoglobin: 8.8 g/dL — ABNORMAL LOW (ref 12.0–15.0)
Hemoglobin: 9.9 g/dL — ABNORMAL LOW (ref 12.0–15.0)

## 2022-08-04 LAB — GLUCOSE, CAPILLARY: Glucose-Capillary: 107 mg/dL — ABNORMAL HIGH (ref 70–99)

## 2022-08-04 SURGERY — COLONOSCOPY WITH PROPOFOL
Anesthesia: General

## 2022-08-04 MED ORDER — PROPOFOL 500 MG/50ML IV EMUL
INTRAVENOUS | Status: DC | PRN
  Administered 2022-08-04: 150 ug/kg/min via INTRAVENOUS

## 2022-08-04 MED ORDER — PROPOFOL 500 MG/50ML IV EMUL
INTRAVENOUS | Status: AC
  Filled 2022-08-04: qty 50

## 2022-08-04 MED ORDER — FENTANYL CITRATE (PF) 100 MCG/2ML IJ SOLN
INTRAMUSCULAR | Status: DC | PRN
  Administered 2022-08-04 (×2): 50 ug via INTRAVENOUS

## 2022-08-04 MED ORDER — LIDOCAINE 2% (20 MG/ML) 5 ML SYRINGE: INTRAMUSCULAR | Status: DC | PRN

## 2022-08-04 MED ORDER — PROPOFOL 1000 MG/100ML IV EMUL
INTRAVENOUS | Status: AC
  Filled 2022-08-04: qty 100

## 2022-08-04 MED ORDER — SUCCINYLCHOLINE CHLORIDE 200 MG/10ML IV SOSY
PREFILLED_SYRINGE | INTRAVENOUS | Status: DC | PRN
  Administered 2022-08-04: 100 mg via INTRAVENOUS

## 2022-08-04 MED ORDER — SODIUM CHLORIDE 0.9% IV SOLUTION: Freq: Once | INTRAVENOUS | Status: AC

## 2022-08-04 MED ORDER — SODIUM CHLORIDE 0.9 % IV BOLUS
500.0000 mL | Freq: Once | INTRAVENOUS | Status: AC
  Administered 2022-08-04: 500 mL via INTRAVENOUS

## 2022-08-04 MED ORDER — DILTIAZEM HCL 30 MG PO TABS: 30.0000 mg | ORAL_TABLET | Freq: Four times a day (QID) | ORAL | Status: DC | PRN

## 2022-08-04 MED ORDER — LACTATED RINGERS IV SOLN
INTRAVENOUS | Status: AC | PRN
  Administered 2022-08-04: 1000 mL via INTRAVENOUS

## 2022-08-04 MED ORDER — LIDOCAINE 2% (20 MG/ML) 5 ML SYRINGE
INTRAMUSCULAR | Status: DC | PRN
  Administered 2022-08-04: 100 mg via INTRAVENOUS

## 2022-08-04 MED ORDER — ONDANSETRON HCL 4 MG/2ML IJ SOLN
INTRAMUSCULAR | Status: DC | PRN
  Administered 2022-08-04: 4 mg via INTRAVENOUS

## 2022-08-04 MED ORDER — SODIUM CHLORIDE (PF) 0.9 % IJ SOLN
INTRAMUSCULAR | Status: AC
  Filled 2022-08-04: qty 50

## 2022-08-04 MED ORDER — FENTANYL CITRATE (PF) 100 MCG/2ML IJ SOLN
INTRAMUSCULAR | Status: AC
  Filled 2022-08-04: qty 2

## 2022-08-04 MED ORDER — LACTATED RINGERS IV SOLN: INTRAVENOUS | Status: DC | PRN

## 2022-08-04 MED ORDER — PROPOFOL 10 MG/ML IV BOLUS
INTRAVENOUS | Status: DC | PRN
  Administered 2022-08-04: 50 mg via INTRAVENOUS
  Administered 2022-08-04: 150 mg via INTRAVENOUS

## 2022-08-04 MED ORDER — PHENYLEPHRINE 80 MCG/ML (10ML) SYRINGE FOR IV PUSH (FOR BLOOD PRESSURE SUPPORT)
PREFILLED_SYRINGE | INTRAVENOUS | Status: DC | PRN
  Administered 2022-08-04 (×7): 80 ug via INTRAVENOUS
  Administered 2022-08-04: 160 ug via INTRAVENOUS

## 2022-08-04 MED ORDER — TRAMADOL HCL 50 MG PO TABS
100.0000 mg | ORAL_TABLET | Freq: Three times a day (TID) | ORAL | Status: DC | PRN
  Administered 2022-08-04: 50 mg via ORAL
  Administered 2022-08-04 – 2022-08-09 (×10): 100 mg via ORAL
  Filled 2022-08-04 (×11): qty 2

## 2022-08-04 MED ORDER — IOHEXOL 350 MG/ML SOLN
100.0000 mL | Freq: Once | INTRAVENOUS | Status: AC | PRN
  Administered 2022-08-04: 100 mL via INTRAVENOUS

## 2022-08-04 MED ORDER — ESMOLOL HCL 100 MG/10ML IV SOLN
INTRAVENOUS | Status: DC | PRN
  Administered 2022-08-04: 10 mg via INTRAVENOUS

## 2022-08-04 MED ORDER — CHLORHEXIDINE GLUCONATE CLOTH 2 % EX PADS
6.0000 | MEDICATED_PAD | Freq: Every day | CUTANEOUS | Status: DC
  Administered 2022-08-04 – 2022-08-09 (×5): 6 via TOPICAL

## 2022-08-04 SURGICAL SUPPLY — 22 items

## 2022-08-04 NOTE — Transfer of Care (Signed)
Immediate Anesthesia Transfer of Care Note  Patient: [name]  Procedure(s) Performed: COLONOSCOPY WITH PROPOFOL  Patient Location: Endoscopy Unit  Anesthesia Type:General  Level of Consciousness: drowsy and patient cooperative  Airway & Oxygen Therapy: Patient Spontanous Breathing and Patient connected to face mask oxygen  Post-op Assessment: Report given to RN and Post -op Vital signs reviewed and stable  Post vital signs: Reviewed and stable  Last Vitals:  Vitals Value Taken Time  BP 91/37 07/22/2022 1552  Temp 36.3 C 08/05/2022 1549  Pulse 94 08/03/2022 1555  Resp 20 07/20/2022 1555  SpO2 100 % 07/20/2022 1555  Vitals shown include unvalidated device data.  Last Pain:  Vitals:   08/05/2022 1552  TempSrc:   PainSc: 0-No pain         Complications: No notable events documented.

## 2022-08-04 NOTE — Op Note (Signed)
[facility] Patient Name: [name] Procedure Date: 07/31/2022 MRN: [id number] Attending MD: Jackquline Denmark , MD, [id number] Date of Birth: 06/07/1961 CSN: [id number] Age: 61 Admit Type: Inpatient Procedure:                Colonoscopy Indications:              Continued melena in pt with neg push enteroscopies                            x 2 except for small bowel AVMs, neg CTA. VCE                            showing small bowel AVMs with dark aspirate in mid                            small bowel. Providers:                Jackquline Denmark, MD, Burtis Junes, RN, Darliss Cheney,                            Technician, Adair Laundry, CRNA Referring MD:              Medicines:                GA Complications:            No immediate complications. Estimated Blood Loss:     Estimated blood loss: none. Procedure:                Pre-Anesthesia Assessment:                           - Prior to the procedure, a History and Physical                            was performed, and patient medications and                            allergies were reviewed. The patient's tolerance of                            previous anesthesia was also reviewed. The risks                            and benefits of the procedure and the sedation                            options and risks were discussed with the patient.                            All questions were answered, and informed consent                            was obtained. Prior Anticoagulants: The patient has  taken no anticoagulant or antiplatelet agents. ASA                            Grade Assessment: III - A patient with severe                            systemic disease. After reviewing the risks and                            benefits, the patient was deemed in satisfactory                            condition to undergo the procedure.                           After obtaining informed consent, the  colonoscope                            was passed under direct vision. Throughout the                            procedure, the patient's blood pressure, pulse, and                            oxygen saturations were monitored continuously. The                            PCF-HQ190L ([id number] Olympus colonoscope was                            introduced through the anus and advanced to the 10                            cm into the ileum. The colonoscopy was somewhat                            difficult due to inadequate bowel prep. Successful                            completion of the procedure was aided by lavage.                            The patient tolerated the procedure well. The                            quality of the bowel preparation was inadequate.                            The terminal ileum, ileocecal valve, appendiceal                            orifice, and rectum were photographed. Scope In: 3:10:52 PM Scope Out: 3:35:42 PM Scope Withdrawal Time: 0 hours 18 minutes 3 seconds  Total  Procedure Duration: 0 hours 24 minutes 50 seconds  Findings:      A few medium-mouthed diverticula were found in the sigmoid colon and       ascending colon. Melanotic stool throughout the colon limiting the exam       despite aggressive lavage.      Some melanotic aspirate were noted in the TI as well which could easily       be washed. The terminal ileum appeared normal.      The exam was otherwise without abnormality on direct and retroflexion       views. Impression:               - Likely small bowel source of bleeding. Beyond the                            reach of pediatric colonoscope. No active bleeding.                           - Preparation of the colon was inadequate d/t                            melanotic stool throughout the colon.                           - Diverticulosis in the sigmoid colon and in the                            ascending colon.                            - The examined portion of the ileum was normal.                           - The examination was otherwise normal on direct                            and retroflexion views.                           - No specimens collected. Moderate Sedation:      none      Not Applicable - Patient had care per Anesthesia. Recommendation:           - Return patient to hospital ward for ongoing care.                           - Clear liquid diet.                           - Continue present medications.                           - Trend Hb/Hct. Transfuse to keep Hb>7                           - If any further active bleeding, CTA followed by  IR angiography. Will discuss with IR if there is                            any utility to proceed with mesenteric angiography                            even with negative CTA.                           - If continued problems, single balloon enteroscopy                            vs double-balloon enteroscopy at [facility].                           - She would likely need LAR octreotide near                            discharge.                           - The findings and recommendations were discussed                            with the patient's family. Procedure Code(s):        --- Professional ---                           [phone], Colonoscopy, flexible; diagnostic, including                            collection of specimen(s) by brushing or washing,                            when performed (separate procedure) Diagnosis Code(s):        --- Professional ---                           K92.1, Melena (includes Hematochezia)                           K57.30, Diverticulosis of large intestine without                            perforation or abscess without bleeding CPT copyright 2022 American [employer]. All rights reserved. The codes documented in this report are preliminary and upon coder review may  be revised to meet current  compliance requirements. Jackquline Denmark, MD 07/27/2022 3:49:31 PM This report has been signed electronically. Number of Addenda: 0

## 2022-08-04 NOTE — Progress Notes (Signed)
Delayed note  Events and rapid response noted.  I have discussed with the patient's husband-around 6:30 PM.  He was quite frustrated as we have not been able to pinpoint the exact site of bleeding.  He was wondering if the patient can be transferred to [facility].  Patient had gone for CTA.  For now supportive care Transfuse as needed    Addendum: CTA negative Will discuss with IR in AM for any role of elective mesenteric angiography If family still wants, will initiate transfer to [facility] (if feasible) RG

## 2022-08-04 NOTE — Progress Notes (Addendum)
Patient ID: [name], female   DOB: [birth year], 61 y.o.   MRN: [id number]    Progress Note   Subjective   Day # 3 CC; GI bleed   Hgb 9.4 11/27>10.6>10.3>8.9 this am  Patient and husband both frustrated and somewhat upset at lack of ability to find the source of her bleeding.  She had several frankly bloody bowel movements with prep last night and had a syncopal episode admits to the prep.  She drank a little more than half of the prep.  She also had episode of hematemesis last p.m.. No further bowel movements over the past 7 hours and feels okay this morning.   Objective   Vital signs in last 24 hours: Temp:  [97.4 F (36.3 C)-98.7 F (37.1 C)] 98.5 F (36.9 C) (11/28 0507) Pulse Rate:  [74-110] 74 (11/28 0507) Resp:  [16-18] 18 (11/28 0507) BP: (112-136)/(50-109) 112/50 (11/28 0507) SpO2:  [96 %-99 %] 96 % (11/28 0507) Last BM Date : 08/03/2022 General:    white female in NAD Heart:  Regular rate and rhythm; no murmurs Lungs: Respirations even and unlabored, lungs CTA bilaterally Abdomen:  Soft, nontender and nondistended. Normal bowel sounds. Extremities:  Without edema. Neurologic:  Alert and oriented,  grossly normal neurologically. Psych:  Cooperative. Normal mood and affect.  Intake/Output from previous day: 11/27 0701 - 11/28 0700 In: 1123.9 [P.O.:900; I.V.:223.9] Out: -  Intake/Output this shift: No intake/output data recorded.  Lab Results: Recent Labs    07/16/2022 0845 07/17/2022 1341 08/03/22 0108 08/03/22 0828 08/03/22 1801 08/03/22 2140 08/05/2022 0521  WBC 13.5*  --  8.1  --   --   --   --   HGB 10.9*   < > 9.0*  9.1*   < > 10.6* 10.3* 8.9*  HCT 33.7*   < > 28.1*  28.7*   < > 33.7* 32.3* 28.5*  PLT 334  --  312  --   --   --   --    < > = values in this interval not displayed.   BMET Recent Labs    07/25/2022 0845 08/03/22 0108  NA 142 141  K 4.0 3.6  CL 106 106  CO2 26 27  GLUCOSE 104* 86  BUN 10 9  CREATININE 0.72 0.79  CALCIUM 8.9  8.5*   LFT Recent Labs    08/03/22 0108  PROT 5.6*  ALBUMIN 2.9*  AST 14*  ALT 14  ALKPHOS 37*  BILITOT 0.6   PT/INR Recent Labs    07/22/2022 0845  LABPROT 13.7  INR 1.1    Studies/Results: CT ANGIO GI BLEED  Result Date: 07/28/2022 CLINICAL DATA:  Rectal bleeding EXAM: CTA ABDOMEN AND PELVIS WITHOUT AND WITH CONTRAST TECHNIQUE: Multidetector CT imaging of the abdomen and pelvis was performed using the standard protocol during bolus administration of intravenous contrast. Multiplanar reconstructed images and MIPs were obtained and reviewed to evaluate the vascular anatomy. RADIATION DOSE REDUCTION: This exam was performed according to the departmental dose-optimization program which includes automated exposure control, adjustment of the mA and/or kV according to patient size and/or use of iterative reconstruction technique. CONTRAST:  185m OMNIPAQUE IOHEXOL 350 MG/ML SOLN COMPARISON:  None Available. FINDINGS: VASCULAR No evidence of active GI bleed. NON-VASCULAR Lower chest: Near-complete resolution of left lower lobe consolidation. Small hiatal hernia. Cardiomegaly. Hepatobiliary: No focal liver abnormality is seen. Status post cholecystectomy. No biliary dilatation. Pancreas: Unremarkable. No pancreatic ductal dilatation or surrounding inflammatory changes. Spleen: Normal in size without  focal abnormality. Adrenals/Urinary Tract: Bilateral adrenal glands are unremarkable. Small bilateral parapelvic cysts. Bladder is unremarkable. Stomach/Bowel: Stomach is within normal limits. Mild diverticulosis no evidence of bowel wall thickening, distention, or inflammatory changes. Lymphatic: Aortic atherosclerosis. Patent right aortoiliac bypass graft. No enlarged abdominal or pelvic lymph nodes. Reproductive: Uterus and bilateral adnexa are unremarkable. Other: No abdominal wall hernia or abnormality. No abdominopelvic ascites. Musculoskeletal: Prior right hip replacement and lumbar spine fusion.  No aggressive appearing osseous lesions IMPRESSION: 1. No evidence of active GI bleed. 2. No acute abnormalities in the abdomen or pelvis. 3. Near-complete resolution of left lower lobe consolidation, compatible with resolving pneumonia Electronically Signed   By: Yetta Glassman M.D.   On: 07/30/2022 19:45   DG Chest 2 View  Result Date: 08/03/2022 CLINICAL DATA:  Aspiration. EXAM: CHEST - 2 VIEW COMPARISON:  July 29, 2022. FINDINGS: Stable cardiomediastinal silhouette. Right lung is clear. Significantly improved left lung opacity is noted. Bony thorax is unremarkable. IMPRESSION: Significantly improved left lung opacity. Electronically Signed   By: Marijo Conception M.D.   On: 07/27/2022 09:32       Assessment / Plan:    #75 61 year old white female with history of recurrent GI bleeding.  Very recent admit within the past 2 weeks with acute bleed and underwent small bowel enteroscopy at that time found to have a bleeding AVM in the duodenum with an erosion as well as a single distal duodenum both of which were ablated, that she also had a clip placed in the area of a possible subepithelial lesion versus angulation. Follow-up CT scan did not show any evidence of vascular lesion within the wall Did have complication of an aspiration pneumonitis after procedures.  Recurrent bleeding this past weekend and readmitted with melena and anemia  Work-up this admission with small bowel enteroscopy with 5 cm hiatal hernia mild retch gastropathy, erosions in the third portion of the duodenum and fourth portion of the duodenum corresponding with the previous APC sites, Vesely placed Hemoclip noted in the third portion of the duodenum no evidence of active bleeding.  Capsule endoscopy yesterday again no active bleeding in the proximal small bowel, 1 small nonbleeding AVM noted, Hemoclip presents with trace heme no active bleeding, in the distal small bowel there was gross melena which continued into the  visualized colon but no lesion visible  Patient had syncopal episode with bowel prep last night, and passed large dark maroonish bowel movements-mostly blood  Hemoglobin down to 8.9 this morning no further stools or bleeding over the past 7 to 8 hours  Source for bleeding is felt to be in the distal small bowel/right colon .  She has the known proximal small bowel AVMs as outlined above and may have AVMs in the very distal small bowel as well.  #2 atrial fibrillation-not on anticoagulation #3 anemia acute secondary to blood loss  Plan; continue serial hemoglobins every 6 hours, transfuse as indicated Plan to proceed with colonoscopy with intubation of the terminal ileum this afternoon If patient has further active bleeding today or over the next 24 hours would proceed quickly to CTA Further recommendations pending findings at colonoscopy today, if unable to identify any clear source for bleeding she may require double-balloon enteroscopy      Principal Problem:   GIB (gastrointestinal bleeding) Active Problems:   Osteoarthritis   GERD (gastroesophageal reflux disease)   Spinal stenosis   Hematemesis with nausea   Paroxysmal atrial fibrillation (HCC)   Class II obesity  Acute blood loss anemia   Dark stools     LOS: 2 days   Amy Esterwood  PA-C 07/13/2022, 8:38 AM    Attending physician's note   I have taken history, reviewed the chart and examined the patient. I performed a substantive portion of this encounter, including complete performance of at least one of the key components, in conjunction with the APP. I agree with the Advanced Practitioner's note, impression and recommendations.   For colon with TI intubation today.  Trend CBC If continued bleeding, CTA followed by IR embolization   Carmell Austria, MD [facility] [phone]

## 2022-08-04 NOTE — Progress Notes (Signed)
I received a text earlier this evening regarding this patient after she had a large episode of BRB per rectum. The phone number in the text was not a valid number. I was unable to reach her floor care team by direct message. I reviewed recent notes including her endoscopy from earlier today. I was able to reach Dr. Cyndia Skeeters, the hospitalist, by phone who confirmed plans for CTA +/- angiogram tonight. The hospitalist expects a call with the  radiology results when they are available. In the meantime, the patient is being supported with serial hemoglobin/hematocrit with transfusion as indicted.   I am available tonight by phone with any additional questions or concerns.

## 2022-08-04 NOTE — Anesthesia Postprocedure Evaluation (Signed)
Anesthesia Post Note  Patient: MCKAY BRANDT  Procedure(s) Performed: COLONOSCOPY WITH PROPOFOL     Patient location during evaluation: PACU Anesthesia Type: General Level of consciousness: awake and alert Pain management: pain level controlled Vital Signs Assessment: post-procedure vital signs reviewed and stable Respiratory status: spontaneous breathing, nonlabored ventilation, respiratory function stable and patient connected to nasal cannula oxygen Cardiovascular status: blood pressure returned to baseline and stable Postop Assessment: no apparent nausea or vomiting Anesthetic complications: no  No notable events documented.  Last Vitals:  Vitals:   07/15/2022 1602 08/05/2022 1610  BP: (!) 90/40 (!) 92/45  Pulse: 96 91  Resp: (!) 24 16  Temp:    SpO2: 95% 94%    Last Pain:  Vitals:   07/21/2022 1610  TempSrc:   PainSc: 0-No pain                 Barnet Glasgow

## 2022-08-04 NOTE — Anesthesia Preprocedure Evaluation (Signed)
Anesthesia Evaluation  Patient identified by MRN, date of birth, ID band Patient awake    Reviewed: Allergy & Precautions, NPO status , Patient's Chart, lab work & pertinent test results  History of Anesthesia Complications (+) history of anesthetic complications (patient reports aspiration events with previous colonoscopies, including on 07/28/22)  Airway Mallampati: II  TM Distance: >3 FB Neck ROM: Full    Dental no notable dental hx.    Pulmonary neg shortness of breath, neg COPD, neg recent URI, former smoker   Pulmonary exam normal breath sounds clear to auscultation       Cardiovascular (-) angina + dysrhythmias Atrial Fibrillation + Valvular Problems/Murmurs MVP  Rhythm:Regular Rate:Normal  TTE 08/28/2020: IMPRESSIONS     1. Left ventricular ejection fraction, by estimation, is 60 to 65%. The  left ventricle has normal function. The left ventricle has no regional  wall motion abnormalities. Left ventricular diastolic function could not  be evaluated.   2. Right ventricular systolic function is normal. The right ventricular  size is normal. There is normal pulmonary artery systolic pressure. The  estimated right ventricular systolic pressure is 29.9 mmHg.   3. Left atrial size was mildly dilated.   4. A small pericardial effusion is present. The pericardial effusion is  posterior to the left ventricle. There is no evidence of cardiac  tamponade.   5. Hx of MV repair. Either an annuloplasty ring is present or mild MAC is  present. Unclear surgical details, but does not have typical appearance of  annuloplasty ring. The mitral valve has been repaired/replaced. Mild  mitral valve regurgitation. No  evidence of mitral stenosis.   6. The aortic valve is tricuspid. There is mild calcification of the  aortic valve. There is mild thickening of the aortic valve. Aortic valve  regurgitation is not visualized. Mild aortic valve  sclerosis is present,  with no evidence of aortic valve  stenosis.   7. The inferior vena cava is normal in size with greater than 50%  respiratory variability, suggesting right atrial pressure of 3 mmHg.      Neuro/Psych neg Seizures  Neuromuscular disease (lumbar spondylosis)    GI/Hepatic Neg liver ROS,GERD  ,,  Endo/Other  negative endocrine ROS    Renal/GU negative Renal ROS     Musculoskeletal  (+) Arthritis , Osteoarthritis,    Abdominal  (+) + obese  Peds  Hematology  (+) Blood dyscrasia, anemia   Anesthesia Other Findings   Reproductive/Obstetrics                              Anesthesia Physical Anesthesia Plan  ASA: 3  Anesthesia Plan: General   Post-op Pain Management:    Induction: Rapid sequence  PONV Risk Score and Plan: 3 and Propofol infusion, Treatment may vary due to age or medical condition and TIVA  Airway Management Planned: Oral ETT  Additional Equipment:   Intra-op Plan:   Post-operative Plan: Extubation in OR  Informed Consent: I have reviewed the patients History and Physical, chart, labs and discussed the procedure including the risks, benefits and alternatives for the proposed anesthesia with the patient or authorized representative who has indicated his/her understanding and acceptance.     Dental advisory given  Plan Discussed with: CRNA and Anesthesiologist  Anesthesia Plan Comments: (Risks of general anesthesia discussed including, but not limited to, sore throat, hoarse voice, chipped/damaged teeth, injury to vocal cords, nausea and vomiting, allergic reactions, lung infection,  heart attack, stroke, and death. All questions answered. )         Anesthesia Quick Evaluation

## 2022-08-04 NOTE — Anesthesia Procedure Notes (Signed)
Procedure Name: Intubation Date/Time: 07/29/2022 2:58 PM  Performed by: Montel Clock, CRNAPre-anesthesia Checklist: Patient identified, Emergency Drugs available, Suction available, Patient being monitored and Timeout performed Patient Re-evaluated:Patient Re-evaluated prior to induction Oxygen Delivery Method: Circle system utilized Preoxygenation: Pre-oxygenation with 100% oxygen Induction Type: IV induction and Rapid sequence Laryngoscope Size: Mac and 3 Grade View: Grade I Tube type: Oral Tube size: 7.0 mm Number of attempts: 1 Airway Equipment and Method: Stylet Placement Confirmation: ETT inserted through vocal cords under direct vision, positive ETCO2 and breath sounds checked- equal and bilateral Secured at: 21 cm Tube secured with: Tape Dental Injury: Teeth and Oropharynx as per pre-operative assessment

## 2022-08-04 NOTE — Progress Notes (Signed)
Paged by patient's RN about active rectal bleed and episode of LOC this evening.  BP 90/52.  HR 78.  Ordered transfer to SDU, start CT angio, stat CBC, NS bolus 500 cc.  Prepare 2 units of blood.  On-call GI aware of the situation.

## 2022-08-04 NOTE — Progress Notes (Signed)
PROGRESS NOTE  [name] QHU:[id number] DOB: Feb 22, 1961   PCP: Redmond School, MD  Patient is from: Home.  Lives with husband.  Independently ambulates at baseline.  DOA: 08/03/2022 LOS: 2  Chief complaints Chief Complaint  Patient presents with   GI Bleeding     Brief Narrative / Interim history: 61 year old F with PMH of paroxysmal A-fib not on AC, IDA, osteoarthritis, GERD, GIB and duodenal AVM/telangiectasia presenting with multiple episodes of hematemesis and rectal bleed, and admitted due to GI bleed.  Vital stable except for mild tachycardia.  Hgb 10.9 (10.6 on 07/29/2022 when she was discharged from the hospital).  Hemoccult positive.  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  Capsule endoscopy without active bleeding but 1 small nonbleeding AVM in proximal small bowel.   Colonoscopy later today.  Subjective: Seen and examined earlier this morning.  Patient had an episode of vasovagal while sitting on toilet last night.  She had bloody bowel movements.  She passed out but did not fall.  Hemoglobin dropped down to 8.8 this morning.  Feels tired.  Objective: Vitals:   07/29/2022 0139 07/25/2022 0141 07/15/2022 0507 07/20/2022 1401  BP:   (!) 112/50 118/67  Pulse: (!) 110 (!) 105 74 (!) 102  Resp: _0 Temp: 98.7 F (37.1 C) 98.7 F (37.1 C) 98.5 F (36.9 C) 99.3 F (37.4 C)  TempSrc: Oral Oral    SpO2: 96%  96% 97%  Weight:    88.6 kg  Height:    _1  (1.575 m)    Examination:   GENERAL: No apparent distress.  Nontoxic. HEENT: MMM.  Vision and hearing grossly intact.  NECK: Supple.  No apparent JVD.  RESP:  No IWOB.  Fair aeration bilaterally. CVS:  RRR. Heart sounds normal.  ABD/GI/GU: BS+. Abd soft, NTND.  MSK/EXT:  Moves extremities. No apparent deformity. No edema.  SKIN: no apparent skin lesion or wound NEURO: Awake and  alert. Oriented appropriately.  No apparent focal neuro deficit. PSYCH: Calm. Normal affect.   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   11/26-capsule showed nonbleeding AVM. 11/28-colonoscopy  Microbiology summarized: None  Assessment and plan: Principal Problem:   GIB (gastrointestinal bleeding) Active Problems:   Osteoarthritis   GERD (gastroesophageal reflux disease)   Spinal stenosis   Hematemesis with nausea   Paroxysmal atrial fibrillation (HCC)   Class II obesity   Acute blood loss anemia   Dark stools  Acute GI bleed/reactive gastropathy-presents with hematemesis and rectal bleed.  H&H at baseline.  Not on blood thinners or NSAID.  Hemodynamically stable.  EGD and capsule endoscopy as above.  She had iron infusion previous hospitalization. Recent Labs    07/31/2022 0845 07/12/2022 1341 07/29/2022 1929 08/03/22 0108 08/03/22 0828 08/03/22 1340 08/03/22 1801 08/03/22 2140 07/13/2022 0521 07/31/2022 0843  HGB 10.9* 10.8* 9.4* 9.0*  9.1* 10.6* 9.4* 10.6* 10.3* 8.9* 8.8*  -Monitor H&H every 6 hours -Continue IV Protonix -Plan for colonoscopy later today -Verbally consented for blood transfusion if indicated -Follow further GI recommendation  Paroxysmal A-fib: In A-fib but rate controlled.  Not on anticoagulation due to GI bleed and low CHADS2 score. -Cardizem 30 mg every 8 hours as needed for now -Resume home Cardizem CD once stable from bleeding standpoint -Optimize electrolytes  Vasovagal  syncope: Patient had an episode of syncope while sitting on toilet.  No fall or trauma. -Continue telemetry -Cardizem as above.  Osteoarthritis/spinal stenosis/low back pain: Stable -Tylenol as needed -Continue home tramadol  Class II obesity Body mass index is 35.73 kg/m.   DVT prophylaxis:  SCDs Start: 08/01/2022 1331  Code Status: Full code Family  Communication: None at bedside today Level of care: Telemetry Status is: Inpatient Remains inpatient appropriate because: Due to acute GI bleed   Final disposition: Likely home once medically cleared Consultants:  Gastroenterology  Sch Meds:  Scheduled Meds:  [MAR Hold] estrogens-methylTEST  1 tablet Oral Daily   [MAR Hold] medroxyPROGESTERone  5 mg Oral Daily   [MAR Hold] peg 3350 powder  0.5 kit Oral Once   scopolamine  1 patch Transdermal Q72H   Continuous Infusions:  lactated ringers 10 mL/hr at 07/30/2022 1451   pantoprazole 8 mg/hr (07/23/2022 1147)   PRN Meds:.[MAR Hold] acetaminophen, [MAR Hold] diltiazem, lactated ringers, [MAR Hold] prochlorperazine, [MAR Hold] traMADol  Antimicrobials: Anti-infectives (From admission, onward)    None        I have personally reviewed the following labs and images: CBC: Recent Labs  Lab 07/29/22 0356 07/08/2022 0845 07/08/2022 1341 08/03/22 0108 08/03/22 0828 08/03/22 1340 08/03/22 1801 08/03/22 2140 07/08/2022 0521 07/13/2022 0843  WBC 15.6* 13.5*  --  8.1  --   --   --   --   --   --   NEUTROABS  --  10.8*  --   --   --   --   --   --   --   --   HGB 10.6* 10.9*   < > 9.0*  9.1*   < > 9.4* 10.6* 10.3* 8.9* 8.8*  HCT 32.9* 33.7*   < > 28.1*  28.7*   < > 29.4* 33.7* 32.3* 28.5* 27.8*  MCV 95.1 97.7  --  97.6  --   --   --   --   --   --   PLT 253 334  --  312  --   --   --   --   --   --    < > = values in this interval not displayed.   BMP &GFR Recent Labs  Lab 07/29/22 0356 07/16/2022 0845 08/03/22 0108  NA 138 142 141  K 3.6 4.0 3.6  CL 102 106 106  CO2 _0 GLUCOSE 105* 104* 86  BUN _1 CREATININE 0.73 0.72 0.79  CALCIUM 8.2* 8.9 8.5*   Estimated Creatinine Clearance: 75.4 mL/min (by C-G formula based on SCr of 0.79 mg/dL). Liver & Pancreas: Recent Labs  Lab 07/29/22 0356 07/29/2022 0845 08/03/22 0108  AST 21 22 14*  ALT _2 ALKPHOS 43 41 37*  BILITOT 1.5* 1.0 0.6  PROT 5.2* 6.6 5.6*   ALBUMIN 2.8* 3.3* 2.9*   No results for input(s): "LIPASE", "AMYLASE" in the last 168 hours. No results for input(s): "AMMONIA" in the last 168 hours. Diabetic: No results for input(s): "HGBA1C" in the last 72 hours. Recent Labs  Lab 08/03/22 2101  GLUCAP 124*   Cardiac Enzymes: No results for input(s): "CKTOTAL", "CKMB", "CKMBINDEX", "TROPONINI" in the last 168 hours. Recent Labs    03/24/22 1110  PROBNP 1,025*   Coagulation Profile: Recent Labs  Lab 08/05/2022 0845  INR 1.1   Thyroid Function Tests: No results for input(s): "TSH", "T4TOTAL", "FREET4", "T3FREE", "THYROIDAB" in the last 72 hours. Lipid  Profile: No results for input(s): "CHOL", "HDL", "LDLCALC", "TRIG", "CHOLHDL", "LDLDIRECT" in the last 72 hours. Anemia Panel: No results for input(s): "VITAMINB12", "FOLATE", "FERRITIN", "TIBC", "IRON", "RETICCTPCT" in the last 72 hours. Urine analysis:    Component Value Date/Time   COLORURINE YELLOW 09/03/2012 1135   APPEARANCEUR CLEAR 09/03/2012 1135   LABSPEC >1.030 (H) 09/03/2012 1135   PHURINE 5.5 09/03/2012 1135   GLUCOSEU NEGATIVE 09/03/2012 1135   HGBUR SMALL (A) 09/03/2012 1135   BILIRUBINUR negative 06/15/2022 1717   KETONESUR moderate (40) (A) 06/15/2022 1717   KETONESUR NEGATIVE 09/03/2012 1135   PROTEINUR =30 (A) 06/15/2022 1717   PROTEINUR NEGATIVE 09/03/2012 1135   UROBILINOGEN 1.0 06/15/2022 1717   UROBILINOGEN 0.2 09/03/2012 1135   NITRITE Positive (A) 06/15/2022 1717   NITRITE NEGATIVE 09/03/2012 1135   LEUKOCYTESUR Trace (A) 06/15/2022 1717   Sepsis Labs: Invalid input(s): "PROCALCITONIN", "LACTICIDVEN"  Microbiology: No results found for this or any previous visit (from the past 240 hour(s)).  Radiology Studies: No results found.    Neldon Shepard T. Calhoun  If 7PM-7AM, please contact night-coverage [email or web address] 08/03/2022, 3:29 PM

## 2022-08-04 NOTE — Telephone Encounter (Signed)
Latrecia, As you know, along with my partners, we rotate through the hospital on a weekly basis to care for patients in our practice for hospitalized.  This is more efficient than running back and forth between the office and the hospital.  It allows [place] to provide the best care, overall. Having said that, my partners have been very good about keeping me informed about your entire hospital course.  As well, I have been reviewing your chart and procedure findings. The majority of the evidence suggest that you have an area of bleeding somewhere in your small intestine.  Often, this can be quite challenging to locate.  The plan for colonoscopy was to look at the very bottom of the small intestine which connects to the colon.  If that does not work (or provide an answer/solution), one of my partners has specialized training in using a special scope that can go deeply into the small intestine from above. Our team this week is Amy Esterwood PA-C and Dr. Carmell Austria.  I will copy them on this note.  Please feel free to asked them anything that you would like.  They are excellent. Thank you for your confidence in me and reaching out.  I will continue to follow closely. Best, Dr. Henrene Pastor

## 2022-08-05 DIAGNOSIS — R195 Other fecal abnormalities: Secondary | ICD-10-CM | POA: Diagnosis not present

## 2022-08-05 DIAGNOSIS — D62 Acute posthemorrhagic anemia: Secondary | ICD-10-CM | POA: Diagnosis not present

## 2022-08-05 DIAGNOSIS — E669 Obesity, unspecified: Secondary | ICD-10-CM | POA: Diagnosis not present

## 2022-08-05 DIAGNOSIS — K922 Gastrointestinal hemorrhage, unspecified: Secondary | ICD-10-CM | POA: Diagnosis not present

## 2022-08-05 LAB — HEPATIC FUNCTION PANEL
ALT: 13 U/L (ref 0–44)
AST: 12 U/L — ABNORMAL LOW (ref 15–41)
Albumin: 2.6 g/dL — ABNORMAL LOW (ref 3.5–5.0)
Alkaline Phosphatase: 41 U/L (ref 38–126)
Bilirubin, Direct: 0.2 mg/dL (ref 0.0–0.2)
Indirect Bilirubin: 0.6 mg/dL (ref 0.3–0.9)
Total Bilirubin: 0.8 mg/dL (ref 0.3–1.2)
Total Protein: 4.9 g/dL — ABNORMAL LOW (ref 6.5–8.1)

## 2022-08-05 LAB — CBC
HCT: 29.2 % — ABNORMAL LOW (ref 36.0–46.0)
Hemoglobin: 9.4 g/dL — ABNORMAL LOW (ref 12.0–15.0)
MCH: 30.9 pg (ref 26.0–34.0)
MCHC: 32.2 g/dL (ref 30.0–36.0)
MCV: 96.1 fL (ref 80.0–100.0)
Platelets: 307 10*3/uL (ref 150–400)
RBC: 3.04 MIL/uL — ABNORMAL LOW (ref 3.87–5.11)
RDW: 16.9 % — ABNORMAL HIGH (ref 11.5–15.5)
WBC: 7.6 10*3/uL (ref 4.0–10.5)
nRBC: 0 % (ref 0.0–0.2)

## 2022-08-05 LAB — HEMOGLOBIN AND HEMATOCRIT, BLOOD
HCT: 29.3 % — ABNORMAL LOW (ref 36.0–46.0)
HCT: 27.6 % — ABNORMAL LOW (ref 36.0–46.0)
HCT: 27.7 % — ABNORMAL LOW (ref 36.0–46.0)
Hemoglobin: 9.5 g/dL — ABNORMAL LOW (ref 12.0–15.0)
Hemoglobin: 9.1 g/dL — ABNORMAL LOW (ref 12.0–15.0)
Hemoglobin: 9.2 g/dL — ABNORMAL LOW (ref 12.0–15.0)

## 2022-08-05 LAB — PREPARE RBC (CROSSMATCH)

## 2022-08-05 LAB — RENAL FUNCTION PANEL
Albumin: 2.6 g/dL — ABNORMAL LOW (ref 3.5–5.0)
Anion gap: 7 (ref 5–15)
BUN: 11 mg/dL (ref 8–23)
CO2: 23 mmol/L (ref 22–32)
Calcium: 7.7 mg/dL — ABNORMAL LOW (ref 8.9–10.3)
Chloride: 111 mmol/L (ref 98–111)
Creatinine, Ser: 0.66 mg/dL (ref 0.44–1.00)
GFR, Estimated: >60 mL/min (ref >60)
Glucose, Bld: 88 mg/dL (ref 70–99)
Phosphorus: 3.2 mg/dL (ref 2.5–4.6)
Potassium: 3.7 mmol/L (ref 3.5–5.1)
Sodium: 141 mmol/L (ref 135–145)

## 2022-08-05 LAB — MAGNESIUM: Magnesium: 1.8 mg/dL (ref 1.7–2.4)

## 2022-08-05 MED ORDER — SODIUM CHLORIDE 0.9 % IV BOLUS
250.0000 mL | Freq: Once | INTRAVENOUS | Status: AC
  Administered 2022-08-05: 250 mL via INTRAVENOUS

## 2022-08-05 MED ORDER — PANTOPRAZOLE SODIUM 40 MG IV SOLR
40.0000 mg | INTRAVENOUS | Status: DC
  Administered 2022-08-05: 40 mg via INTRAVENOUS
  Filled 2022-08-05: qty 10

## 2022-08-05 NOTE — Significant Event (Signed)
Rapid Response Event Note   Reason for Call : Syncopal episode    Initial Focused Assessment:  Patient alert and oriented x 4, skin warm/ dry  and pale. Patient had a large bloody bowel movement approx 600 ml of cranberry liquid stool. Assisted back to bed. B/P 78/42. Placed patient in trendelenburg blood pressure 84/48    Interventions:  Transfer to SD CTA ABD 500 ml Bolus    MD Notified: Dr. Cyndia Skeeters, Dr. Lyndel Safe  Call Time: 1830 Arrival Time: 1835 End Time: [place] Corona Popovich, RN

## 2022-08-05 NOTE — Progress Notes (Signed)
    OVERNIGHT PROGRESS REPORT  Notified by RN for concern of low BP while completing PRBC orders. Single bolus 250cc NS infused between units of blood with some improvement into the 90s SBP  2nd unit PRBCs infusing now.    Gershon Cull MSNA MSN ACNPC-AG Acute Care Nurse Practitioner [place]

## 2022-08-05 NOTE — Progress Notes (Signed)
Received call from blood bank stating 1 additional unit PRBC ready, notified MD and received orders to hold administration for now. Patient's vital signs stable, no active bleeding, hemoglobin 9.4.

## 2022-08-05 NOTE — Progress Notes (Signed)
PROGRESS NOTE    [name]  VUY:[id number] DOB: [birth year] DOA: 08/03/2022 PCP: Redmond School, MD   Brief Narrative:  Patient is an obese 61 year old Caucasian female with a past medical history significant for but not limited to proximal atrial fibrillation not on anticoagulation, history of iron deficiency anemia, osteoarthritis, GERD, history of GI bleed and duodenal AVM/telangiectasia presented with multiple episodes of hematemesis and rectal bleed and was not admitted due to GI bleeding.  Vitals were stable except for mild tachycardia and her presenting hemoglobin was 10.9 and it was 10.6 on the day that she was discharged on 07/30/2019 when she was discharged from the hospital.  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  Capsule endoscopy was done without active bleeding but she had 1 small nonbleeding AVM in the proximal small bowel.  She underwent a colonoscopy which felt that the small bowel was likely the source of her bleeding and preparation of the colon was inadequate due to melanotic stool throughout the colon.  She was found to have diverticulosis in the sigmoid colon and in the ascending colon examined portion ileum was normal.  Later in the day yesterday after her colonoscopy she had an active rectal bleed and had an episode of loss of consciousness blood pressure dropped and heart rate was 78.  She is transferred to the stepdown unit and a stat CT angio was done and was negative for any extravasation to suggest active bleeding.  And given 500 mL bolus and given 2 units of PRBCs.  GI was made aware of the situation and subsequently she is improved this morning with a hemoglobin being 9.4.  Her blood pressures remain the softer side but she feels okay.  The current plan was to try and get her transferred to a tertiary care  center at [facility] for a double-balloon enteroscopy however the GI new physicians declined and taking the patient stating extreme limited availability for double-balloon enteroscopy.  GI has now updated the plan to proceed with a Meckel scan in the morning and recommending continue aggressive supportive measures as well as transfusing as indicated.  GI is also considering a trial of octreotide if initiated then no need to plan to continue outpatient if there is no definitive source.  If the patient rebleeds prior to Meckel scan in the morning they are agreeable to proceed with a stat nuclear medicine bleeding scan.  Assessment and Plan: No notes have been filed under this hospital service. Service: Hospitalist  Acute GI Bleed/Reactive Gastropathy -Presents with hematemesis and rectal bleed.  H&H at baseline.  Not on blood thinners or NSAID. -Hemodynamically stable but blood pressure is on the lower side now.   -EGD and capsule endoscopy have been done.   -She had iron infusion previous hospitalization. -Given her syncopal episode she was typed and screened and transfused 2 units of PRBCs and had an emergent CTA which was negative for any extravasation to suggest active bleed -Monitor H&H every 6 hours -Continue IV Protonix -Diet has been advanced to full liquid diet per GI -Colonoscopy done and preparation of the colon was inadequate due to melanotic stool throughout the colon but there is diverticulosis in the sigmoid colon and the ascending.  The examined portion of the ileum was normal and examination was otherwise normal on direct and retroflexion views.  GI felt that the  likely source of her bleeding was small bowel and was beyond the reach of the pediatric colonoscope. -Patient's hemoglobin/hematocrit after 2 units has now improved and has relatively stabilized and has gone from 7.1/22.7 -> 9.4/29.2 -> 9.5/29.3 -> 9.1/27.6 -Verbally consented for blood transfusion if indicated -Follow  further GI recommendation and given that she had a syncopal episode with 600 mL of bright red blood yesterday GI recommended attempting to get her transferred to [facility] for double-balloon enteroscopy however the GI physicians at [facility] declined to take the transfer stating extreme limited availability for the double-balloon enteroscopy. -GI is now recommending proceeding with a Meckel scan in the morning and continue aggressive supportive measures as well as transfusing as indicated -GI is also considering a trial of octreotide and this is initiated they would recommend planning to continue this in the outpatient setting if there is no definite source and if she rebleeds prior to the Meckel scan we will proceed to a stat nuclear medicine bleeding scan   Paroxysmal A-fib -In A-fib but rate controlled.  Not on anticoagulation due to GI bleed and low CHADS2 score. -Cardizem 30 mg every 8 hours as needed for now -Resume home Cardizem CD once stable from bleeding standpoint but continue to hold given that she is hypotensive -Optimize electrolytes   Vasovagal syncope -Patient had an episode of syncope while sitting on toilet previously and again had another Syncopal episode on the evening of 08/05/27 No fall or trauma. -Continue SDU telemetry Monitoring -The repeat syncopal episode on 1128 happened after she had a large bloody bowel movement approximately 600 mL of cranberry liquid stool.  Blood pressure at that time was 78/42 and she had to be placed in Trendelenburg.  She had a stat CTA of the abdomen and was transferred to the stepdown unit given 500 mL bolus -CTA done and showed  Osteoarthritis/spinal stenosis/low back pain -Stable -C/w Tylenol as needed -Continue home tramadol   Obesity -Complicates overall prognosis and care -Estimated body mass index is 35.73 kg/m as calculated from the following:   Height as of this encounter: '5\' 2"'$  (1.575 m).   Weight as of this encounter: 88.6 kg.  -Weight  Loss and Dietary Counseling given  DVT prophylaxis: SCDs Start: 07/11/2022 1331    Code Status: Full Code Family Communication: Discussed with husband at bedside  Disposition Plan:  Level of care: Stepdown Status is: Inpatient Remains inpatient appropriate because: Remain in stepdown unit given her high risk for further decompensation and will need further clearance by gastroenterology   Consultants:  Gastroenterology  Procedures:  COLONOSCOPY Findings:      A few medium-mouthed diverticula were found in the sigmoid colon and       ascending colon. Melanotic stool throughout the colon limiting the exam       despite aggressive lavage.      Some melanotic aspirate were noted in the TI as well which could easily       be washed. The terminal ileum appeared normal.      The exam was otherwise without abnormality on direct and retroflexion       views. Impression:               - Likely small bowel source of bleeding. Beyond the                            reach of pediatric colonoscope. No active bleeding.                           -  Preparation of the colon was inadequate d/t                            melanotic stool throughout the colon.                           - Diverticulosis in the sigmoid colon and in the                            ascending colon.                           - The examined portion of the ileum was normal.                           - The examination was otherwise normal on direct                            and retroflexion views.                           - No specimens collected. Moderate Sedation:      none      Not Applicable - Patient had care per Anesthesia. Recommendation:           - Return patient to hospital ward for ongoing care.                           - Clear liquid diet.                           - Continue present medications.                           - Trend Hb/Hct. Transfuse to keep Hb>7                           - If any further active  bleeding, CTA followed by                            IR angiography. Will discuss with IR if there is                            any utility to proceed with mesenteric angiography                            even with negative CTA.                           - If continued problems, single balloon enteroscopy                            vs double-balloon enteroscopy at [facility].                           - She would likely need LAR  octreotide near                            discharge.                           - The findings and recommendations were discussed                            with the patient's family.  Antimicrobials:  Anti-infectives (From admission, onward)    None       Subjective: Seen and examined at bedside and was doing little bit better.  Wanting to eat something.  No nausea or vomiting.  Blood pressure is on the softer side but stable.  No other concerns or complaints at this time and has very minimal abdominal discomfort.  Objective: Vitals:   08/05/22 1230 08/05/22 1416 08/05/22 1500 08/05/22 1649  BP: 119/63 106/75 110/73 108/73  Pulse: (!) 111 (!) 115 (!) 115 94  Resp: '19 14 17 17  '$ Temp:    98.3 F (36.8 C)  TempSrc:    Oral  SpO2: 97% 97% 96% 97%  Weight:      Height:        Intake/Output Summary (Last 24 hours) at 08/05/2022 1733 Last data filed at 08/05/2022 0700 Gross per 24 hour  Intake 1883.35 ml  Output --  Net 1883.35 ml   Filed Weights   07/27/2022 1104 07/12/2022 1124 07/18/2022 1401  Weight: 88.6 kg 88.6 kg 88.6 kg   Examination: Physical Exam:  Constitutional: WN/WD obese Caucasian female currently no acute distress appears calm Respiratory: Diminished to auscultation bilaterally, no wheezing, rales, rhonchi or crackles. Normal respiratory effort and patient is not tachypenic. No accessory muscle use.  Unlabored breathing Cardiovascular: RRR, no murmurs / rubs / gallops. S1 and S2 auscultated.  No appreciable extremity edema Abdomen: Soft, very  minimally-tender, distended recommended to body habitus. Bowel sounds positive.  GU: Deferred. Musculoskeletal: No clubbing / cyanosis of digits/nails. No joint deformity upper and lower extremities.   Skin: No rashes, lesions, ulcers on limited skin evaluation. No induration; Warm and dry.  Neurologic: CN 2-12 grossly intact with no focal deficits.  Romberg sign and cerebellar reflexes not assessed.  Psychiatric: Normal judgment and insight. Alert and oriented x 3. Normal mood and appropriate affect.   Data Reviewed: I have personally reviewed following labs and imaging studies  CBC: Recent Labs  Lab 08/01/2022 0845 07/15/2022 1341 08/03/22 0108 08/03/22 0828 07/15/2022 1639 07/09/2022 2114 08/05/22 0721 08/05/22 1102 08/05/22 1506  WBC 13.5*  --  8.1  --   --  13.8* 7.6  --   --   NEUTROABS 10.8*  --   --   --   --   --   --   --   --   HGB 10.9*   < > 9.0*  9.1*   < > 9.9* 7.1* 9.4* 9.5* 9.1*  HCT 33.7*   < > 28.1*  28.7*   < > 32.4* 22.7* 29.2* 29.3* 27.6*  MCV 97.7  --  97.6  --   --  100.4* 96.1  --   --   PLT 334  --  312  --   --  339 307  --   --    < > = values in this interval not displayed.   Basic Metabolic Panel: Recent Labs  Lab 07/27/2022 0845 08/03/22 0108 08/05/22 0721  NA 142 141 141  K 4.0 3.6 3.7  CL 106 106 111  CO2 '26 27 23  '$ GLUCOSE 104* 86 88  BUN '10 9 11  '$ CREATININE 0.72 0.79 0.66  CALCIUM 8.9 8.5* 7.7*  MG  --   --  1.8  PHOS  --   --  3.2   GFR: Estimated Creatinine Clearance: 75.4 mL/min (by C-G formula based on SCr of 0.66 mg/dL). Liver Function Tests: Recent Labs  Lab 07/28/2022 0845 08/03/22 0108 08/05/22 0721 08/05/22 1102  AST 22 14*  --  12*  ALT 18 14  --  13  ALKPHOS 41 37*  --  41  BILITOT 1.0 0.6  --  0.8  PROT 6.6 5.6*  --  4.9*  ALBUMIN 3.3* 2.9* 2.6* 2.6*   No results for input(s): "LIPASE", "AMYLASE" in the last 168 hours. No results for input(s): "AMMONIA" in the last 168 hours. Coagulation Profile: Recent Labs  Lab  07/18/2022 0845  INR 1.1   Cardiac Enzymes: No results for input(s): "CKTOTAL", "CKMB", "CKMBINDEX", "TROPONINI" in the last 168 hours. BNP (last 3 results) Recent Labs    03/24/22 1110  PROBNP 1,025*   HbA1C: No results for input(s): "HGBA1C" in the last 72 hours. CBG: Recent Labs  Lab 08/03/22 2101 08/01/2022 1816  GLUCAP 124* 107*   Lipid Profile: No results for input(s): "CHOL", "HDL", "LDLCALC", "TRIG", "CHOLHDL", "LDLDIRECT" in the last 72 hours. Thyroid Function Tests: No results for input(s): "TSH", "T4TOTAL", "FREET4", "T3FREE", "THYROIDAB" in the last 72 hours. Anemia Panel: No results for input(s): "VITAMINB12", "FOLATE", "FERRITIN", "TIBC", "IRON", "RETICCTPCT" in the last 72 hours. Sepsis Labs: Recent Labs  Lab 07/16/2022 0845  LATICACIDVEN 1.9   No results found for this or any previous visit (from the past 240 hour(s)).   Radiology Studies: CT ANGIO GI BLEED  Result Date: 07/31/2022 CLINICAL DATA:  Gastrointestinal bleeding.  Colonoscopy today. EXAM: CTA ABDOMEN AND PELVIS WITHOUT AND WITH CONTRAST TECHNIQUE: Multidetector CT imaging of the abdomen and pelvis was performed using the standard protocol during bolus administration of intravenous contrast. Multiplanar reconstructed images and MIPs were obtained and reviewed to evaluate the vascular anatomy. RADIATION DOSE REDUCTION: This exam was performed according to the departmental dose-optimization program which includes automated exposure control, adjustment of the mA and/or kV according to patient size and/or use of iterative reconstruction technique. CONTRAST:  133m OMNIPAQUE IOHEXOL 350 MG/ML SOLN COMPARISON:  August 02, 2022. FINDINGS: VASCULAR Aorta: Atherosclerosis of abdominal aorta is noted without aneurysm or dissection. Celiac: Patent without evidence of aneurysm, dissection, vasculitis or significant stenosis. SMA: Patent without evidence of aneurysm, dissection, vasculitis or significant stenosis.  Renals: Both renal arteries are patent without evidence of aneurysm, dissection, vasculitis, fibromuscular dysplasia or significant stenosis. IMA: Patent without evidence of aneurysm, dissection, vasculitis or significant stenosis. Inflow: Status post right aortofemoral bypass graft which is widely patent. Left iliac arteries are widely patent. Proximal Outflow: Bilateral common femoral and visualized portions of the superficial and profunda femoral arteries are patent without evidence of aneurysm, dissection, vasculitis or significant stenosis. Veins: No obvious venous abnormality within the limitations of this arterial phase study. Review of the MIP images confirms the above findings. NON-VASCULAR Lower chest: Minimal scarring is seen involving the visualized lung bases. Moderate size sliding-type hiatal hernia is noted. Hepatobiliary: No focal liver abnormality is seen. Status post cholecystectomy. No biliary dilatation. Pancreas: Unremarkable. No pancreatic ductal dilatation or surrounding inflammatory changes. Spleen: Normal in  size without focal abnormality. Adrenals/Urinary Tract: Adrenal glands appear normal. No hydronephrosis or renal obstruction is noted. Urinary bladder is unremarkable. Low density is seen involving the inferior pole of the left kidney concerning for renal infarction or pyelonephritis. Stomach/Bowel: There is no evidence of bowel obstruction or inflammation. There is no definite evidence of active gastrointestinal bleeding. Stool is noted in the right colon. Lymphatic: No adenopathy is noted. Reproductive: Uterus and bilateral adnexa are unremarkable. Other: No abdominal wall hernia or abnormality. No abdominopelvic ascites. Musculoskeletal: Status post right hip arthroplasty. No acute osseous abnormality is noted. IMPRESSION: VASCULAR No definite evidence of active gastrointestinal bleeding. Atherosclerosis of abdominal aorta is noted without aneurysm or dissection. Patent right  aortofemoral bypass graft is noted. NON-VASCULAR Low density is seen involving the inferior pole of the left kidney concerning for renal infarction or possibly pyelonephritis. This is new since prior exam. Electronically Signed   By: Marijo Conception M.D.   On: 07/23/2022 19:35    Scheduled Meds:  Chlorhexidine Gluconate Cloth  6 each Topical Daily   estrogens-methylTEST  1 tablet Oral Daily   medroxyPROGESTERone  5 mg Oral Daily   pantoprazole (PROTONIX) IV  40 mg Intravenous Q24H   Continuous Infusions:   LOS: 3 days   Raiford Noble, DO [facility] Available via Epic secure chat 7am-7pm After these hours, please refer to coverage provider listed on [email or web address] 08/05/2022, 5:33 PM

## 2022-08-05 NOTE — Progress Notes (Addendum)
Patient ID: [name], female   DOB: [birth year], 61 y.o.   MRN: [id number]    Progress Note   Subjective   Day # 4  CC;GI bleeding  Colonoscopy yesterday-no active bleeding found in the colon, there was melena in the colon which was lavaged, some melanotic aspirate also noted in terminal ileum which could easily be washed terminal ileum appeared normal Few medium sized diverticuli noted in the sigmoid and ascending colon-prep was not good  Several hours post colonoscopy around 6:30 PM last night patient had a large bloody bowel movement documented as 600 cc of cranberry appearing liquid stool is associated with hypotension Patient was transferred to ICU, received fluid boluses, has since had 2 units of packed RBCs for hemoglobin down to 7.1  HGB 9.4 this a.m.  CTA was done emergently which was negative for any extravasation to suggest active bleed.  Blood pressure has been a little soft this morning, patient has not had any further bowel movements overnight and says she feels okay, she is hungry.     Objective   Vital signs in last 24 hours: Temp:  [97.3 F (36.3 C)-99.3 F (37.4 C)] 98.8 F (37.1 C) (11/29 0720) Pulse Rate:  [84-125] 106 (11/29 0807) Resp:  [12-31] 17 (11/29 0807) BP: (77-118)/(36-67) 105/40 (11/29 0807) SpO2:  [94 %-100 %] 96 % (11/29 0807) Weight:  [88.6 kg] 88.6 kg (11/28 1401) Last BM Date : 07/31/2022 General:    white female in NAD, husband at bedside Heart:  Regular rate and rhythm; no murmurs Lungs: Respirations even and unlabored, lungs CTA bilaterally Abdomen:  Soft, nontender and nondistended. Normal bowel sounds. Extremities:  Without edema. Neurologic:  Alert and oriented,  grossly normal neurologically. Psych:  Cooperative. Normal mood and affect.  Intake/Output from previous day: 11/28 0701 - 11/29 0700 In: 2583.4 [I.V.:944.6; Blood:907; IV Piggyback:731.7] Out: -  Intake/Output this shift: No intake/output data recorded.  Lab  Results: Recent Labs    08/03/22 0108 08/03/22 0828 07/08/2022 1639 07/29/2022 2114 08/05/22 0721  WBC 8.1  --   --  13.8* 7.6  HGB 9.0*  9.1*   < > 9.9* 7.1* 9.4*  HCT 28.1*  28.7*   < > 32.4* 22.7* 29.2*  PLT 312  --   --  339 307   < > = values in this interval not displayed.   BMET Recent Labs    08/05/2022 0845 08/03/22 0108 08/05/22 0721  NA 142 141 141  K 4.0 3.6 3.7  CL 106 106 111  CO2 '26 27 23  '$ GLUCOSE 104* 86 88  BUN '10 9 11  '$ CREATININE 0.72 0.79 0.66  CALCIUM 8.9 8.5* 7.7*   LFT Recent Labs    08/03/22 0108 08/05/22 0721  PROT 5.6*  --   ALBUMIN 2.9* 2.6*  AST 14*  --   ALT 14  --   ALKPHOS 37*  --   BILITOT 0.6  --    PT/INR Recent Labs    07/30/2022 0845  LABPROT 13.7  INR 1.1    Studies/Results: CT ANGIO GI BLEED  Result Date: 07/08/2022 CLINICAL DATA:  Gastrointestinal bleeding.  Colonoscopy today. EXAM: CTA ABDOMEN AND PELVIS WITHOUT AND WITH CONTRAST TECHNIQUE: Multidetector CT imaging of the abdomen and pelvis was performed using the standard protocol during bolus administration of intravenous contrast. Multiplanar reconstructed images and MIPs were obtained and reviewed to evaluate the vascular anatomy. RADIATION DOSE REDUCTION: This exam was performed according to the departmental dose-optimization program which includes  automated exposure control, adjustment of the mA and/or kV according to patient size and/or use of iterative reconstruction technique. CONTRAST:  133m OMNIPAQUE IOHEXOL 350 MG/ML SOLN COMPARISON:  August 02, 2022. FINDINGS: VASCULAR Aorta: Atherosclerosis of abdominal aorta is noted without aneurysm or dissection. Celiac: Patent without evidence of aneurysm, dissection, vasculitis or significant stenosis. SMA: Patent without evidence of aneurysm, dissection, vasculitis or significant stenosis. Renals: Both renal arteries are patent without evidence of aneurysm, dissection, vasculitis, fibromuscular dysplasia or significant  stenosis. IMA: Patent without evidence of aneurysm, dissection, vasculitis or significant stenosis. Inflow: Status post right aortofemoral bypass graft which is widely patent. Left iliac arteries are widely patent. Proximal Outflow: Bilateral common femoral and visualized portions of the superficial and profunda femoral arteries are patent without evidence of aneurysm, dissection, vasculitis or significant stenosis. Veins: No obvious venous abnormality within the limitations of this arterial phase study. Review of the MIP images confirms the above findings. NON-VASCULAR Lower chest: Minimal scarring is seen involving the visualized lung bases. Moderate size sliding-type hiatal hernia is noted. Hepatobiliary: No focal liver abnormality is seen. Status post cholecystectomy. No biliary dilatation. Pancreas: Unremarkable. No pancreatic ductal dilatation or surrounding inflammatory changes. Spleen: Normal in size without focal abnormality. Adrenals/Urinary Tract: Adrenal glands appear normal. No hydronephrosis or renal obstruction is noted. Urinary bladder is unremarkable. Low density is seen involving the inferior pole of the left kidney concerning for renal infarction or pyelonephritis. Stomach/Bowel: There is no evidence of bowel obstruction or inflammation. There is no definite evidence of active gastrointestinal bleeding. Stool is noted in the right colon. Lymphatic: No adenopathy is noted. Reproductive: Uterus and bilateral adnexa are unremarkable. Other: No abdominal wall hernia or abnormality. No abdominopelvic ascites. Musculoskeletal: Status post right hip arthroplasty. No acute osseous abnormality is noted. IMPRESSION: VASCULAR No definite evidence of active gastrointestinal bleeding. Atherosclerosis of abdominal aorta is noted without aneurysm or dissection. Patent right aortofemoral bypass graft is noted. NON-VASCULAR Low density is seen involving the inferior pole of the left kidney concerning for renal  infarction or possibly pyelonephritis. This is new since prior exam. Electronically Signed   By: JMarijo ConceptionM.D.   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  She also had a clip placed in the area of a possible subepithelial lesion versus angulation. Follow-up CT scan did not show any evidence of vascular lesion within the wall at that location. It was complicated by an aspiration pneumonitis  Readmitted on 11/26-after recurrence of melena at home . She underwent a second small bowel enteroscopy with findings of a 5 cm hiatal hernia evidence of mild retch gastropathy, there were erosions in the third portion of the duodenum and fourth portion of the duodenum corresponding to the prior APC sites The previously placed Hemoclip was found in the third portion of the duodenum with no evidence of active bleeding. Capsule was placed endoscopically and again no active bleeding noted in the proximal and mid small bowel, there was 1 small nonbleeding AVM noted and there was trace amount of heme at the prior Hemoclip site but no active bleeding.  In the more distal small bowel there  was gross melena which continued throughout into the visualized colon, no lesion visible  She underwent colonoscopy yesterday no active bleeding in the colon, there is melena scattered throughout the colon, and into the terminal ileum, this was lavaged a few scattered left colon diverticuli. The visualized terminal ileum was normal  Suspected source of bleed is in the distal small bowel  She had another acute episode of GI bleeding about 3 hours after the colonoscopy dark maroonish stool which was  associated with hypotension  Emergent CTA was done and negative  Required 2 units of packed RBCs last evening and hemoglobin 9.4 this morning No further active bleeding  #2 atrial fibrillation-not on anticoagulation due to history of recurrent bleeding #3 osteoarthritis/spinal stenosis  Plan-start full liquid diet this morning Continue IV PPI  Continue serial hemoglobins and transfuse as indicated Plan is to get her transferred to [facility] for double-balloon enteroscopy, and continue support here until she is able to be transferred.  Patient and her husband are both strongly in favor of transfer.  Have discussed with the hospitalist, Dr. Alfredia Ferguson will initiate transfer from medicine standpoint, and we will contact the transfer center and discussed with the GI service at [facility].   Addendum- 4:15pm -patient has been stable throughout the day today, no further bleeding, repeat hemoglobin 9.5 stable  Unfortunately [facility] has declined to take patient in transfer stating extremely limited availability for double-balloon enteroscopy.  There is no availability for double-balloon enteroscopy at [facility] or [facility].  We will plan to proceed with Meckel scan in a.m., continue aggressive supportive measures as have been doing, transfuse as indicated Consider trial of octreotide, if initiated then would need to plan to continue outpatient if we have no definitive source this was discussed briefly with the patient. If patient rebleeds prior to Meckel scan in a.m., would proceed to stat nuclear medicine bleeding scan.  Long discussion with the patient and her husband regarding above they are accepting of the current plan.  Principal Problem:   GIB (gastrointestinal bleeding) Active Problems:   Osteoarthritis   GERD (gastroesophageal reflux disease)   Spinal stenosis   Hematemesis with nausea   Paroxysmal atrial fibrillation (HCC)   Class II obesity   Acute blood loss anemia   Dark  stools     LOS: 3 days   Amy Esterwood PA-C 08/05/2022, 8:34 AM      Attending physician's note   I have taken history, reviewed the chart and examined the patient. I performed a substantive portion of this encounter, including complete performance of at least one of the key components, in conjunction with the APP. I agree with the Advanced Practitioner's note, impression and recommendations.   Recurrent GI bleed (suspected distal SB source by VCE). Neg push SBE x 2, colon with deep TI intubation, CTA x 2. S/P 2U this adm.  [facility] declined inpt transfer d/t very limited availability of Double Balloon Enteroscopy. DBE not done at [facility] or [facility] (I confirmed). Site of bleed as evident by VCE is beyond reach of Single Balloon Enterosocpy (Dr Baker Janus has kindly reviewed) Hold off on elective mesenteric angio as contrast requirement would be substantial which puts pt @ risk for AKI.  Plan: -Meckel's scan. -If rebleeds, stat RBC scan. -Trend CBC. -If no further rebleed, LAR octreotide + estogen/prog combination.    Carmell Austria, MD [facility] [phone]

## 2022-08-06 ENCOUNTER — Encounter (HOSPITAL_COMMUNITY): Payer: Self-pay | Admitting: Certified Registered Nurse Anesthetist

## 2022-08-06 ENCOUNTER — Encounter (HOSPITAL_COMMUNITY): Admission: EM | Disposition: E | Payer: Self-pay | Source: Home / Self Care | Attending: Internal Medicine

## 2022-08-06 ENCOUNTER — Inpatient Hospital Stay (HOSPITAL_COMMUNITY): Payer: PRIVATE HEALTH INSURANCE

## 2022-08-06 ENCOUNTER — Inpatient Hospital Stay: Payer: Self-pay

## 2022-08-06 DIAGNOSIS — E669 Obesity, unspecified: Secondary | ICD-10-CM | POA: Diagnosis not present

## 2022-08-06 DIAGNOSIS — R195 Other fecal abnormalities: Secondary | ICD-10-CM | POA: Diagnosis not present

## 2022-08-06 DIAGNOSIS — K922 Gastrointestinal hemorrhage, unspecified: Secondary | ICD-10-CM | POA: Diagnosis not present

## 2022-08-06 DIAGNOSIS — D62 Acute posthemorrhagic anemia: Secondary | ICD-10-CM | POA: Diagnosis not present

## 2022-08-06 LAB — BPAM RBC
Blood Product Expiration Date: 202312222359
Blood Product Expiration Date: 202312222359
Blood Product Expiration Date: 202312222359
Blood Product Expiration Date: 202312222359
ISSUE DATE / TIME: 202311282058
ISSUE DATE / TIME: 202311282300
ISSUE DATE / TIME: 202311290210
ISSUE DATE / TIME: 202311300839
Unit Type and Rh: 6200
Unit Type and Rh: 6200
Unit Type and Rh: 6200
Unit Type and Rh: 6200

## 2022-08-06 LAB — TYPE AND SCREEN
ABO/RH(D): AB POS
Antibody Screen: NEGATIVE
Unit division: 0
Unit division: 0
Unit division: 0
Unit division: 0

## 2022-08-06 LAB — COMPREHENSIVE METABOLIC PANEL
ALT: 10 U/L (ref 0–44)
AST: 11 U/L — ABNORMAL LOW (ref 15–41)
Albumin: 2.3 g/dL — ABNORMAL LOW (ref 3.5–5.0)
Alkaline Phosphatase: 37 U/L — ABNORMAL LOW (ref 38–126)
Anion gap: 7 (ref 5–15)
BUN: 8 mg/dL (ref 8–23)
CO2: 22 mmol/L (ref 22–32)
Calcium: 8.1 mg/dL — ABNORMAL LOW (ref 8.9–10.3)
Chloride: 109 mmol/L (ref 98–111)
Creatinine, Ser: 0.58 mg/dL (ref 0.44–1.00)
GFR, Estimated: >60 mL/min (ref >60)
Glucose, Bld: 84 mg/dL (ref 70–99)
Potassium: 3.6 mmol/L (ref 3.5–5.1)
Sodium: 138 mmol/L (ref 135–145)
Total Bilirubin: 0.6 mg/dL (ref 0.3–1.2)
Total Protein: 4.6 g/dL — ABNORMAL LOW (ref 6.5–8.1)

## 2022-08-06 LAB — CBC WITH DIFFERENTIAL/PLATELET
Abs Immature Granulocytes: 0.08 10*3/uL — ABNORMAL HIGH (ref 0.00–0.07)
Basophils Absolute: 0 10*3/uL (ref 0.0–0.1)
Basophils Relative: 1 %
Eosinophils Absolute: 0.2 10*3/uL (ref 0.0–0.5)
Eosinophils Relative: 3 %
HCT: 26 % — ABNORMAL LOW (ref 36.0–46.0)
Hemoglobin: 8.5 g/dL — ABNORMAL LOW (ref 12.0–15.0)
Immature Granulocytes: 1 %
Lymphocytes Relative: 28 %
Lymphs Abs: 2.2 10*3/uL (ref 0.7–4.0)
MCH: 31.7 pg (ref 26.0–34.0)
MCHC: 32.7 g/dL (ref 30.0–36.0)
MCV: 97 fL (ref 80.0–100.0)
Monocytes Absolute: 0.8 10*3/uL (ref 0.1–1.0)
Monocytes Relative: 10 %
Neutro Abs: 4.8 10*3/uL (ref 1.7–7.7)
Neutrophils Relative %: 57 %
Platelets: 294 10*3/uL (ref 150–400)
RBC: 2.68 MIL/uL — ABNORMAL LOW (ref 3.87–5.11)
RDW: 16.8 % — ABNORMAL HIGH (ref 11.5–15.5)
WBC: 8.1 10*3/uL (ref 4.0–10.5)
nRBC: 0 % (ref 0.0–0.2)

## 2022-08-06 LAB — DIC (DISSEMINATED INTRAVASCULAR COAGULATION)PANEL
D-Dimer, Quant: 2.25 ug/mL-FEU — ABNORMAL HIGH (ref 0.00–0.50)
Fibrinogen: 478 mg/dL — ABNORMAL HIGH (ref 210–475)
INR: 1.2 (ref 0.8–1.2)
Platelets: 309 10*3/uL (ref 150–400)
Prothrombin Time: 15.3 seconds — ABNORMAL HIGH (ref 11.4–15.2)
Smear Review: NONE SEEN
aPTT: 27 seconds (ref 24–36)

## 2022-08-06 LAB — MAGNESIUM: Magnesium: 1.7 mg/dL (ref 1.7–2.4)

## 2022-08-06 LAB — HEMOGLOBIN AND HEMATOCRIT, BLOOD
HCT: 29.3 % — ABNORMAL LOW (ref 36.0–46.0)
HCT: 32.1 % — ABNORMAL LOW (ref 36.0–46.0)
HCT: 28.9 % — ABNORMAL LOW (ref 36.0–46.0)
Hemoglobin: 9.5 g/dL — ABNORMAL LOW (ref 12.0–15.0)
Hemoglobin: 10.8 g/dL — ABNORMAL LOW (ref 12.0–15.0)
Hemoglobin: 9.6 g/dL — ABNORMAL LOW (ref 12.0–15.0)

## 2022-08-06 LAB — PREPARE RBC (CROSSMATCH)

## 2022-08-06 LAB — PHOSPHORUS: Phosphorus: 3 mg/dL (ref 2.5–4.6)

## 2022-08-06 SURGERY — CANCELLED PROCEDURE

## 2022-08-06 MED ORDER — SODIUM CHLORIDE 0.9% FLUSH: 10.0000 mL | INTRAVENOUS | Status: DC | PRN

## 2022-08-06 MED ORDER — OCTREOTIDE LOAD VIA INFUSION
50.0000 ug | Freq: Once | INTRAVENOUS | Status: AC
  Administered 2022-08-06: 50 ug via INTRAVENOUS
  Filled 2022-08-06: qty 25

## 2022-08-06 MED ORDER — SODIUM CHLORIDE 0.9 % IV BOLUS
500.0000 mL | Freq: Once | INTRAVENOUS | Status: AC
  Administered 2022-08-06: 500 mL via INTRAVENOUS

## 2022-08-06 MED ORDER — PANTOPRAZOLE SODIUM 40 MG IV SOLR
40.0000 mg | Freq: Two times a day (BID) | INTRAVENOUS | Status: DC
  Administered 2022-08-06 – 2022-08-09 (×7): 40 mg via INTRAVENOUS
  Filled 2022-08-06 (×7): qty 10

## 2022-08-06 MED ORDER — MAGNESIUM SULFATE 2 GM/50ML IV SOLN
2.0000 g | Freq: Once | INTRAVENOUS | Status: AC
  Administered 2022-08-06: 2 g via INTRAVENOUS
  Filled 2022-08-06: qty 50

## 2022-08-06 MED ORDER — MELATONIN 5 MG PO TABS
10.0000 mg | ORAL_TABLET | Freq: Every day | ORAL | Status: DC
  Administered 2022-08-06 – 2022-08-09 (×4): 10 mg via ORAL
  Filled 2022-08-06 (×4): qty 2

## 2022-08-06 MED ORDER — SODIUM CHLORIDE 0.9% FLUSH
10.0000 mL | Freq: Two times a day (BID) | INTRAVENOUS | Status: DC
  Administered 2022-08-06: 20 mL
  Administered 2022-08-06 – 2022-08-09 (×7): 10 mL

## 2022-08-06 MED ORDER — SODIUM CHLORIDE 0.9% IV SOLUTION: Freq: Once | INTRAVENOUS | Status: AC

## 2022-08-06 MED ORDER — TECHNETIUM TC 99M-LABELED RED BLOOD CELLS IV KIT
22.3000 | PACK | Freq: Once | INTRAVENOUS | Status: AC
  Administered 2022-08-06: 22.3 via INTRAVENOUS

## 2022-08-06 MED ORDER — SODIUM CHLORIDE 0.9 % IV SOLN
20.0000 ug | Freq: Once | INTRAVENOUS | Status: AC
  Administered 2022-08-06: 20 ug via INTRAVENOUS
  Filled 2022-08-06: qty 5

## 2022-08-06 MED ORDER — LIDOCAINE HCL (PF) 1 % IJ SOLN
5.0000 mL | Freq: Once | INTRAMUSCULAR | Status: AC
  Filled 2022-08-06: qty 5

## 2022-08-06 MED ORDER — SODIUM CHLORIDE 0.9% IV SOLUTION: Freq: Once | INTRAVENOUS | Status: DC

## 2022-08-06 MED ORDER — SODIUM CHLORIDE 0.9 % IV SOLN
50.0000 ug/h | INTRAVENOUS | Status: DC
  Administered 2022-08-06 – 2022-08-10 (×9): 50 ug/h via INTRAVENOUS
  Filled 2022-08-06 (×12): qty 1

## 2022-08-06 MED ORDER — MELATONIN 5 MG PO TABS: 20.0000 mg | ORAL_TABLET | Freq: Every day | ORAL | Status: DC

## 2022-08-06 NOTE — Progress Notes (Signed)
PROGRESS NOTE    [name]  RCV:[id number] DOB: 31-May-1961 DOA: 08/01/2022 PCP: Redmond School, MD   Brief Narrative:  Patient is an obese 61 year old Caucasian female with a past medical history significant for but not limited to proximal atrial fibrillation not on anticoagulation, history of iron deficiency anemia, osteoarthritis, GERD, history of GI bleed and duodenal AVM/telangiectasia presented with multiple episodes of hematemesis and rectal bleed and was not admitted due to GI bleeding.  Vitals were stable except for mild tachycardia and her presenting hemoglobin was 10.9 and it was 10.6 on the day that she was discharged on 07/30/2019 when she was discharged from the hospital.  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  Capsule endoscopy was done without active bleeding but she had 1 small nonbleeding AVM in the proximal small bowel.  She underwent a colonoscopy which felt that the small bowel was likely the source of her bleeding and preparation of the colon was inadequate due to melanotic stool throughout the colon.  She was found to have diverticulosis in the sigmoid colon and in the ascending colon examined portion ileum was normal.   Later in the day yesterday after her colonoscopy she had an active rectal bleed and had an episode of loss of consciousness blood pressure dropped and heart rate was 78.  She is transferred to the stepdown unit and a stat CT angio was done and was negative for any extravasation to suggest active bleeding.  And given 500 mL bolus and given 2 units of PRBCs.  GI was made aware of the situation and subsequently she is improved this morning with a hemoglobin being 9.4.  Her blood pressures remain the softer side but she feels okay.  The current plan was to try and get her transferred to a tertiary care  center at [facility] for a double-balloon enteroscopy however the GI new physicians declined and taking the patient stating extreme limited availability for double-balloon enteroscopy.  GI has now updated the plan to proceed with a Meckel scan in the morning and recommending continue aggressive supportive measures as well as transfusing as indicated.  GI is also considering a trial of octreotide if initiated then no need to plan to continue outpatient if there is no definitive source.  If the patient rebleeds prior to Meckel scan in the morning they are agreeable to proceed with a stat nuclear medicine bleeding scan.  **07/21/2022 had another syncopal episode this morning and had hematemesis and rectal bleeding.  She was given a 500 mL bolus and was typed and screened transfuse 2 more units of PRBCs.  GI was urgently consulted and her Meckel scan was discontinued and she was taken for a nuclear medicine bleeding scan.  Given her hypotension the PCCM team was consulted and they placed an arterial line for better blood pressure evaluation and also a PICC line was placed.  Further care pending the results of the nuclear medicine bleeding scan.   Assessment and Plan:  Acute GI Bleed/Reactive Gastropathy with history of small bowel AVMs -Presents with hematemesis and rectal bleed.  H&H at baseline.  Not on blood thinners or NSAID. -Hemodynamically stable but blood pressure is on the lower side now.   -EGD and capsule endoscopy have been done.   -She had iron infusion previous hospitalization. -Given her syncopal episode she was typed and screened  and transfused 2 units of PRBCs and had an emergent CTA which was negative for any extravasation to suggest active bleed -Monitor H&H every 6 hours -Continue IV Protonix -Diet has been advanced to full liquid diet per GI -Colonoscopy done and preparation of the colon was inadequate due to melanotic stool throughout the colon but there is diverticulosis in the  sigmoid colon and the ascending.  The examined portion of the ileum was normal and examination was otherwise normal on direct and retroflexion views.  GI felt that the likely source of her bleeding was small bowel and was beyond the reach of the pediatric colonoscope. -Patient's hemoglobin/hematocrit after 2 units has now improved and has relatively stabilized and has gone from 7.1/22.7 -> 9.4/29.2 -> 9.5/29.3 -> 9.1/27.6 -> 9.2/27.7 -> 8.5/26.0 -> 9.5/29.3 -Verbally consented for blood transfusion if indicated -Follow further GI recommendation and given that she had a syncopal episode with 600 mL of bright red blood yesterday GI recommended attempting to get her transferred to [facility] for double-balloon enteroscopy however the GI physicians at [facility] declined to take the transfer stating extreme limited availability for the double-balloon enteroscopy. -GI was recommending proceeding with a Meckel scan this morning and continue aggressive supportive measures as well as transfusing as indicated however she decompensated further and had a vasovagal episode and had more hematemesis and rectal bleeding with clots.  GI canceled the Meckel scan and opted for a stat nuclear medicine bleeding scan and she was typed and screened and transfused 2 units PRBCs initiated on octreotide as well as given a dose of DDAVP and a 500 mL bolus; GI recommending continuing to check H&H as posttransfusion and every 6 for 24 hours and have initiated octreotide at 50 mcg/h infusion after a 50 mcg bolus.  They are recommending continuing estrogen and progesterone -The tagged RBC scan was delayed due to her being hemodynamically stable in the setting of recurrent large-volume bleeding with large clots from her rectum.  After she is stabilized she was taken for the tagged RBC and the GI team discussed with IR who reviewed the case and if her tagged RBC scan is positive she will need a stat CTA to x-ray target the area of bleeding required for  fluoroscopic angiography -Further care pending nuclear medicine's and bleeding scan results per GI and she may get a repeat enteroscopy -Given her tenuous blood pressures PCCM was consulted and they placed an arterial line and recommended a PICC line and the critical care physician is recommending a balanced transfusion strategy avoiding hypothermia, acidemia coagulopathy and recommending using Levophed as needed   Paroxysmal A-fib -In A-fib but rate controlled.  Not on anticoagulation due to GI bleed and low CHADS2 score. -Cardizem 30 mg every 8 hours as needed for now -Resume home Cardizem CD once stable from bleeding standpoint but continue to hold given that she is hypotensive -Optimize electrolytes   Recurrent Vasovagal syncope -Patient had an episode of syncope while sitting on toilet previously and again had another Syncopal episode on the evening of 08/05/27 No fall or trauma. -She had another Syncopal episode 08/03/2022 she had another large bloody bowel movement -Continue SDU telemetry Monitoring -The repeat syncopal episode on 11/28 happened after she had a large bloody bowel movement approximately 600 mL of cranberry liquid stool.  Blood pressure at that time was 78/42 and she had to be placed in Trendelenburg.  She had a stat CTA of the abdomen and was transferred to the stepdown unit given 500 mL bolus -  CTA done and showed "VASCULAR   No definite evidence of active gastrointestinal bleeding.   Atherosclerosis of abdominal aorta is noted without aneurysm or dissection. Patent right aortofemoral bypass graft is noted.   NON-VASCULAR   Low density is seen involving the inferior pole of the left kidney concerning for renal infarction or possibly pyelonephritis. This is new since prior exam." -She is given another 500 mL bolus on 07/23/2022 and transfused 2 units of PRBCs and taken for a stat nuclear medicine bleeding scan after an arterial line and a PICC line was placed.    Osteoarthritis/spinal stenosis/low back pain -Stable -C/w Tylenol as needed -Continue home tramadol  Hypoalbuminemia -The patient's albumin level is now gone from 2.6 is now 2.3 -Continue to Monitor and Trend and Repeat CMP in the AM    Obesity -Complicates overall prognosis and care -Estimated body mass index is 35.73 kg/m as calculated from the following:   Height as of this encounter: '5\' 2"'$  (1.575 m).   Weight as of this encounter: 88.6 kg.  -Weight Loss and Dietary Counseling given   DVT prophylaxis: Place TED hose Start: 08/05/2022 0837 SCDs Start: 07/24/2022 1331    Code Status: Full Code Family Communication: Discussed with husband at bedside  Disposition Plan:  Level of care: Stepdown Status is: Inpatient Remains inpatient appropriate because: Needs further clinical improvement and clearance by specialist including GI   Consultants:  Gastroenterology PCCM critical care Interventional radiology is aware  Procedures:  COLONOSCOPY Findings:      A few medium-mouthed diverticula were found in the sigmoid colon and       ascending colon. Melanotic stool throughout the colon limiting the exam       despite aggressive lavage.      Some melanotic aspirate were noted in the TI as well which could easily       be washed. The terminal ileum appeared normal.      The exam was otherwise without abnormality on direct and retroflexion       views. Impression:               - Likely small bowel source of bleeding. Beyond the                            reach of pediatric colonoscope. No active bleeding.                           - Preparation of the colon was inadequate d/t                            melanotic stool throughout the colon.                           - Diverticulosis in the sigmoid colon and in the                            ascending colon.                           - The examined portion of the ileum was normal.                           - The  examination was  otherwise normal on direct                            and retroflexion views.                           - No specimens collected. Moderate Sedation:      none      Not Applicable - Patient had care per Anesthesia. Recommendation:           - Return patient to hospital ward for ongoing care.                           - Clear liquid diet.                           - Continue present medications.                           - Trend Hb/Hct. Transfuse to keep Hb>7                           - If any further active bleeding, CTA followed by                            IR angiography. Will discuss with IR if there is                            any utility to proceed with mesenteric angiography                            even with negative CTA.                           - If continued problems, single balloon enteroscopy                            vs double-balloon enteroscopy at [facility].                           - She would likely need LAR octreotide near                            discharge.                           - The findings and recommendations were discussed                            with the patient's family.  Antimicrobials:  Anti-infectives (From admission, onward)    None       Subjective: Seen and examined at bedside and was not feeling well after she just had a syncopal episode and was vomiting blood and having rectal bleeding.  Denies any chest pain but did have some abdominal discomfort.  Felt lightheaded earlier and is doing a little bit better now.  No other concerns or complaints  at this time.  Objective: Vitals:   07/17/2022 1003 07/16/2022 1011 07/15/2022 1032 07/28/2022 1218  BP:      Pulse: 96 98 (!) 135   Resp: 17 16 (!) 26 13  Temp: 98.3 F (36.8 C) 98.3 F (36.8 C) 98.1 F (36.7 C) 98.8 F (37.1 C)  TempSrc: Oral Oral Oral Oral  SpO2: 98% 98%    Weight:      Height:        Intake/Output Summary (Last 24 hours) at 08/01/2022 1417 Last data filed at 08/01/2022  1218 Gross per 24 hour  Intake 1564.81 ml  Output --  Net 1564.81 ml   Filed Weights   08/05/2022 1104 07/11/2022 1124 07/17/2022 1401  Weight: 88.6 kg 88.6 kg 88.6 kg   Examination: Physical Exam:  Constitutional: WN/WD obese Caucasian female currently in mild distress appears little uncomfortable Respiratory: Diminished to auscultation bilaterally with coarse breath sounds, no wheezing, rales, rhonchi or crackles. Normal respiratory effort and patient is not tachypenic. No accessory muscle use.  Unlabored breathing Cardiovascular: RRR, no murmurs / rubs / gallops. S1 and S2 auscultated.  Trace extremity edema.  Abdomen: Soft, a little-tender, distended secondary body habitus. Bowel sounds positive.  GU: Deferred. Musculoskeletal: No clubbing / cyanosis of digits/nails. No joint deformity upper and lower extremities.  Skin: No rashes, lesions, ulcers on limited skin evaluation. No induration; Warm and dry.  Neurologic: CN 2-12 grossly intact with no focal deficits. Romberg sign and cerebellar reflexes not assessed.  Psychiatric: Normal judgment and insight. Alert and oriented x 3.  A little anxious mood and appropriate affect.   Data Reviewed: I have personally reviewed following labs and imaging studies  CBC: Recent Labs  Lab 07/27/2022 0845 07/21/2022 1341 08/03/22 0108 08/03/22 0828 07/24/2022 2114 08/05/22 0721 08/05/22 1102 08/05/22 1506 08/05/22 2026 07/28/2022 0251 07/10/2022 0737  WBC 13.5*  --  8.1  --  13.8* 7.6  --   --   --  8.1  --   NEUTROABS 10.8*  --   --   --   --   --   --   --   --  4.8  --   HGB 10.9*   < > 9.0*  9.1*   < > 7.1* 9.4* 9.5* 9.1* 9.2* 8.5* 9.5*  HCT 33.7*   < > 28.1*  28.7*   < > 22.7* 29.2* 29.3* 27.6* 27.7* 26.0* 29.3*  MCV 97.7  --  97.6  --  100.4* 96.1  --   --   --  97.0  --   PLT 334  --  312  --  339 307  --   --   --  294  --    < > = values in this interval not displayed.   Basic Metabolic Panel: Recent Labs  Lab 07/31/2022 0845  08/03/22 0108 08/05/22 0721 08/05/2022 0521  NA 142 141 141 138  K 4.0 3.6 3.7 3.6  CL 106 106 111 109  CO2 '26 27 23 22  '$ GLUCOSE 104* 86 88 84  BUN '10 9 11 8  '$ CREATININE 0.72 0.79 0.66 0.58  CALCIUM 8.9 8.5* 7.7* 8.1*  MG  --   --  1.8 1.7  PHOS  --   --  3.2 3.0   GFR: Estimated Creatinine Clearance: 75.4 mL/min (by C-G formula based on SCr of 0.58 mg/dL). Liver Function Tests: Recent Labs  Lab 07/28/2022 0845 08/03/22 0108 08/05/22 0721 08/05/22 1102 07/20/2022 0521  AST 22 14*  --  12* 11*  ALT 18 14  --  13 10  ALKPHOS 41 37*  --  41 37*  BILITOT 1.0 0.6  --  0.8 0.6  PROT 6.6 5.6*  --  4.9* 4.6*  ALBUMIN 3.3* 2.9* 2.6* 2.6* 2.3*   No results for input(s): "LIPASE", "AMYLASE" in the last 168 hours. No results for input(s): "AMMONIA" in the last 168 hours. Coagulation Profile: Recent Labs  Lab 07/20/2022 0845  INR 1.1   Cardiac Enzymes: No results for input(s): "CKTOTAL", "CKMB", "CKMBINDEX", "TROPONINI" in the last 168 hours. BNP (last 3 results) Recent Labs    03/24/22 1110  PROBNP 1,025*   HbA1C: No results for input(s): "HGBA1C" in the last 72 hours. CBG: Recent Labs  Lab 08/03/22 2101 08/03/2022 1816  GLUCAP 124* 107*   Lipid Profile: No results for input(s): "CHOL", "HDL", "LDLCALC", "TRIG", "CHOLHDL", "LDLDIRECT" in the last 72 hours. Thyroid Function Tests: No results for input(s): "TSH", "T4TOTAL", "FREET4", "T3FREE", "THYROIDAB" in the last 72 hours. Anemia Panel: No results for input(s): "VITAMINB12", "FOLATE", "FERRITIN", "TIBC", "IRON", "RETICCTPCT" in the last 72 hours. Sepsis Labs: Recent Labs  Lab 08/03/2022 0845  LATICACIDVEN 1.9    No results found for this or any previous visit (from the past 240 hour(s)).   Radiology Studies: DG CHEST PORT 1 VIEW  Result Date: 07/11/2022 CLINICAL DATA:  [id number] Encounter for central line placement [id number] EXAM: PORTABLE CHEST 1 VIEW COMPARISON:  07/13/2022 chest radiograph. FINDINGS: Right PICC  terminates over the cavoatrial junction. Partially visualized surgical hardware from ACDF. Spinal cerclage wire overlies the upper thoracic spine. Stable cardiomediastinal silhouette with top-normal heart size. No pneumothorax. No pleural effusion. Lungs appear clear, with no acute consolidative airspace disease and no pulmonary edema. IMPRESSION: Right PICC terminates over the cavoatrial junction. No pneumothorax. No active cardiopulmonary disease. Electronically Signed   By: Ilona Sorrel M.D.   On: 07/29/2022 11:49   [place] EKG SITE RITE  Result Date: 07/19/2022 If Site Rite image not attached, placement could not be confirmed due to current cardiac rhythm.  CT ANGIO GI BLEED  Result Date: 07/25/2022 CLINICAL DATA:  Gastrointestinal bleeding.  Colonoscopy today. EXAM: CTA ABDOMEN AND PELVIS WITHOUT AND WITH CONTRAST TECHNIQUE: Multidetector CT imaging of the abdomen and pelvis was performed using the standard protocol during bolus administration of intravenous contrast. Multiplanar reconstructed images and MIPs were obtained and reviewed to evaluate the vascular anatomy. RADIATION DOSE REDUCTION: This exam was performed according to the departmental dose-optimization program which includes automated exposure control, adjustment of the mA and/or kV according to patient size and/or use of iterative reconstruction technique. CONTRAST:  164m OMNIPAQUE IOHEXOL 350 MG/ML SOLN COMPARISON:  August 02, 2022. FINDINGS: VASCULAR Aorta: Atherosclerosis of abdominal aorta is noted without aneurysm or dissection. Celiac: Patent without evidence of aneurysm, dissection, vasculitis or significant stenosis. SMA: Patent without evidence of aneurysm, dissection, vasculitis or significant stenosis. Renals: Both renal arteries are patent without evidence of aneurysm, dissection, vasculitis, fibromuscular dysplasia or significant stenosis. IMA: Patent without evidence of aneurysm, dissection, vasculitis or significant  stenosis. Inflow: Status post right aortofemoral bypass graft which is widely patent. Left iliac arteries are widely patent. Proximal Outflow: Bilateral common femoral and visualized portions of the superficial and profunda femoral arteries are patent without evidence of aneurysm, dissection, vasculitis or significant stenosis. Veins: No obvious venous abnormality within the limitations of this arterial phase study. Review of the MIP images confirms the above findings. NON-VASCULAR Lower chest: Minimal scarring is seen involving the visualized lung  bases. Moderate size sliding-type hiatal hernia is noted. Hepatobiliary: No focal liver abnormality is seen. Status post cholecystectomy. No biliary dilatation. Pancreas: Unremarkable. No pancreatic ductal dilatation or surrounding inflammatory changes. Spleen: Normal in size without focal abnormality. Adrenals/Urinary Tract: Adrenal glands appear normal. No hydronephrosis or renal obstruction is noted. Urinary bladder is unremarkable. Low density is seen involving the inferior pole of the left kidney concerning for renal infarction or pyelonephritis. Stomach/Bowel: There is no evidence of bowel obstruction or inflammation. There is no definite evidence of active gastrointestinal bleeding. Stool is noted in the right colon. Lymphatic: No adenopathy is noted. Reproductive: Uterus and bilateral adnexa are unremarkable. Other: No abdominal wall hernia or abnormality. No abdominopelvic ascites. Musculoskeletal: Status post right hip arthroplasty. No acute osseous abnormality is noted. IMPRESSION: VASCULAR No definite evidence of active gastrointestinal bleeding. Atherosclerosis of abdominal aorta is noted without aneurysm or dissection. Patent right aortofemoral bypass graft is noted. NON-VASCULAR Low density is seen involving the inferior pole of the left kidney concerning for renal infarction or possibly pyelonephritis. This is new since prior exam. Electronically Signed    By: Marijo Conception M.D.   On: 07/15/2022 19:35    Scheduled Meds:  Chlorhexidine Gluconate Cloth  6 each Topical Daily   estrogens-methylTEST  1 tablet Oral Daily   medroxyPROGESTERone  5 mg Oral Daily   pantoprazole (PROTONIX) IV  40 mg Intravenous Q12H   sodium chloride flush  10-40 mL Intracatheter Q12H   Continuous Infusions:  desmopressin (DDAVP) 20 mcg in sodium chloride 0.9 % 50 mL IVPB     magnesium sulfate bolus IVPB     octreotide (SANDOSTATIN) 500 mcg in sodium chloride 0.9 % 250 mL (2 mcg/mL) infusion 50 mcg/hr (07/17/2022 1000)    LOS: 4 days   Raiford Noble, DO [facility] Available via Epic secure chat 7am-7pm After these hours, please refer to coverage provider listed on [email or web address] 07/25/2022, 2:17 PM

## 2022-08-06 NOTE — Procedures (Addendum)
Arterial Catheter Insertion Procedure Note  [name]  [id number]  10-Jun-1961  Date:07/23/2022  Time:9:37 AM    Provider Performing: Noe Gens    Procedure: Insertion of Arterial Line [phone]) with US guidance ([id number])   Indication(s) Blood pressure monitoring and/or need for frequent ABGs  Consent Risks of the procedure as well as the alternatives and risks of each were explained to the patient and/or caregiver.  Consent for the procedure was obtained and is signed in the bedside chart  Anesthesia 0.5 ml of lidocaine 1% administered prior to radial stick.    Time Out Verified patient identification, verified procedure, site/side was marked, verified correct patient position, special equipment/implants available, medications/allergies/relevant history reviewed, required imaging and test results available.   Sterile Technique Maximal sterile technique including full sterile barrier drape, hand hygiene, sterile gown, sterile gloves, mask, hair covering, sterile ultrasound probe cover (if used).   Procedure Description Area of catheter insertion was cleaned with chlorhexidine and draped in sterile fashion. With real-time ultrasound guidance an arterial catheter was placed into the left radial artery.  Appropriate arterial tracings confirmed on monitor.  0.5 ml of lidocaine 1% administered prior to radial stick.    Complications/Tolerance None; patient tolerated the procedure well.   EBL Minimal   Specimen(s) None     Noe Gens, MSN, APRN, NP-C, AGACNP-BC [facility] 07/17/2022, 9:37 AM   Please see [email or web address] for pager details.   From 7A-7P if no response, please call [phone] After hours, please call ELink [phone]

## 2022-08-06 NOTE — Progress Notes (Signed)
Patient had 2 additional episodes of bloomy BM with extremely large clot. Patient also experienced lightheadedness and paleness. GI team notified.

## 2022-08-06 NOTE — Progress Notes (Signed)
Peripherally Inserted Central Catheter Placement  The IV Nurse has discussed with the patient and/or persons authorized to consent for the patient, the purpose of this procedure and the potential benefits and risks involved with this procedure.  The benefits include less needle sticks, lab draws from the catheter, and the patient may be discharged home with the catheter. Risks include, but not limited to, infection, bleeding, blood clot (thrombus formation), and puncture of an artery; nerve damage and irregular heartbeat and possibility to perform a PICC exchange if needed/ordered by physician.  Alternatives to this procedure were also discussed.  Bard Power PICC patient education guide, fact sheet on infection prevention and patient information card has been provided to patient /or left at bedside.    PICC Placement Documentation  PICC Triple Lumen 07/20/2022 Right Brachial 39 cm 0 cm (Active)  Indication for Insertion or Continuance of Line Vasoactive infusions;Chronic illness with exacerbations (CF, Sickle Cell, etc.) 07/31/2022 1142  Exposed Catheter (cm) 0 cm 07/11/2022 1142  Site Assessment Clean, Dry, Intact 07/20/2022 1142  Lumen #1 Status Flushed;Saline locked;Blood return noted 07/11/2022 1142  Lumen #2 Status Flushed;Saline locked;Blood return noted 07/29/2022 1142  Lumen #3 Status Flushed;Saline locked;Blood return noted 07/11/2022 1142  Dressing Type Transparent;Securing device 07/09/2022 1142  Dressing Status Antimicrobial disc in place;Clean, Dry, Intact 08/01/2022 1142  Safety Lock Not Applicable 78/29/56 2130  Line Care Connections checked and tightened 07/14/2022 1142  Line Adjustment (NICU/IV Team Only) No 07/22/2022 1142  Dressing Intervention New dressing 08/01/2022 1142  Dressing Change Due 08/13/22 08/05/2022 [place] 07/27/2022, 11:43 AM

## 2022-08-06 NOTE — Progress Notes (Signed)
Verbal order from [facility] MD to initiate blood at 999 ml/hour due to increased bloody stools and vasovagal episodes.

## 2022-08-06 NOTE — Progress Notes (Addendum)
Pharmacy: DDAVP & Magnesium repletion   Active GI bleeding Mg 1.7  Plan: DDAVP 20 mcg IV x 1 dose Mag 2 gm IVPB   Eudelia Bunch, Pharm.D Use secure chat for questions 07/24/2022 9:11 AM

## 2022-08-06 NOTE — Progress Notes (Addendum)
Patient had sudden onset of lightheadedness with large bloody bowel movement while in bed. BP cycled and had readings of SBP in 50-60s range. Patient also experienced hemoptysis. Patient stated she felt like she "was going out." Patient had syncopal episode in bed with associated paleness. Bed placed in trendelenburg position. Blood pressure improved within 4 minutes to SBP in 90s.. Attending MD and GI notified. Orders received to administer 575m NS bolus and assure PRBC ready. New type and screen obtained by phlebotomy. MD arrived to bedside shortly after.

## 2022-08-06 NOTE — Consult Note (Signed)
Consult Note  [name] Sep 02, 1961  [id number].    Requesting MD: Dr. Lyndel Safe Chief Complaint/Reason for Consult: GI bleeding  HPI:   Patient is a 61 year old female admitted 11/26 with recurrent GI bleeding. Recently hospitalized 11/20-11/22 with the same. 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 She also had a clip placed in the area of a possible subepithelial lesion versus angulation. She was discharged home on sucralfate and PPI. Reports Sunday 11/26 she began having maroon stool and clots w/ hematemesis prompting visit to the ED. Denies any abdominal pain. CTA on readmission negative and repeat small bowel enteroscopy showed erosions in the 3rd and 4th portions of the duodenum corresponding w/ prior APC sites without active bleeding. The previous clip in the 3rd portion of the duodenum wit no active bleeding. Colonoscopy 11/28 without any active colonic bleeding, poor prep with melena.  From notes capsule endoscopy showed no active bleeding noted in the proximal and mid small bowel, 1 small nonbleeding AVM noted (in distal small bowel per conversation with GI) gross melena in distal small bowel which continued throughout into the visualized colon with no lesion visible. She has had CTA 11/26 and 11/28 which showed no signs of active bleeding. GI spoke with IR who recommended if tagged RBC scan is positive she will need a stat CTA to accurately target the area of bleeding required for fluoroscopic angiography. Her NM blood loss scan today that showed no evidence of active GI bleed. Per notes they plan repeat CTA if she has any further bloody bm's and have IR on standby. She is scheduled for a Meckle's scan tomorrow. She reports last episode of hematemesis was this am. She passed a dark maroon/melenotic clot this am. No further hematemesis or bloody/melanotic stools  today. She has required 2U of PRBC 11/28 and 11/30 (today). Her hgb is currently 10.8. She has received DDAVP x 1 this am. She is on octreotide and PPI currently. GI suspects the AVM in distal small bowel is the source of bleeding and is to distal to reach with push enteroscopy. They have spoken with [facility] for retrograde enteroscopy and double balloon enteroscopy or retrograde enteroscopy but were declined. We were asked to see.    Denies being on anticoagulation prior to admission. Denies tobacco use. Prior lap chole. Family at bedside.   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   History of vulvar dysplasia    VIN3   Iron deficiency anemia due to chronic blood loss 05/05/2022   Lumbar spondylosis    OA (osteoarthritis)    PMB (postmenopausal bleeding)    Scoliosis    Wears glasses     Past Surgical History:  Procedure Laterality Date   ANTERIOR CERVICAL DECOMP/DISCECTOMY FUSION  11-25-2009  '@[facility]'$    C4 --- C7   ANTERIOR LAT LUMBAR FUSION Left 10/22/2016   Procedure: Anterior Lateral Lumbar Fusion - Lumbar two - lumbar three, with Lateral plate;  Surgeon: Kary Kos, MD;  Location: [facility];  Service: Neurosurgery;  Laterality: Left;  Anterior Lateral Lumbar Fusion - Lumbar two - lumbar three, with Lateral plate   CARDIAC CATHETERIZATION  age 56   CO2 LASER APPLICATION N/A 97/98/9211   Procedure: CO2 LASER APPLICATION;  Surgeon: Gus Height, MD;  Location: [facility];  Service: Gynecology;  Laterality: N/A;   COLONOSCOPY W/ POLYPECTOMY  06/21/2017   Dr.Perry   ENTEROSCOPY N/A 07/28/2022   Procedure: ENTEROSCOPY;  Surgeon: Rush Landmark Telford Nab., MD;  [facility];  Service: Gastroenterology;  Laterality: N/A;   ENTEROSCOPY N/A 07/29/2022   Procedure: ENTEROSCOPY;  Surgeon: Rush Landmark Telford Nab., MD;  Location: [facility];  Service: Gastroenterology;  Laterality: N/A;   GIVENS CAPSULE STUDY N/A 07/26/2022   Procedure: GIVENS CAPSULE STUDY;  Surgeon: Irving Copas., MD;  [facility];  Service: Gastroenterology;  Laterality: N/A;   HEMOSTASIS CLIP PLACEMENT  07/28/2022   Procedure: HEMOSTASIS CLIP PLACEMENT;  Surgeon: Irving Copas., MD;  [facility];  Service: Gastroenterology;;   HOT HEMOSTASIS N/A 07/28/2022   Procedure: HOT HEMOSTASIS (ARGON PLASMA COAGULATION/BICAP);  Surgeon: Irving Copas., MD;  Location: [facility];  Service: Gastroenterology;  Laterality: N/A;   HYSTEROSCOPY WITH D & C N/A 08/27/2020   Procedure: DILATATION AND CURETTAGE /HYSTEROSCOPY;  Surgeon: Vanessa Kick, MD;  Location: [facility];  Service: Gynecology;  Laterality: N/A;   LAPAROSCOPIC CHOLECYSTECTOMY  04/2006   LEG SURGERY Right 1983   femoral artery repair from cardiac cath injury    OPEN REDUCTION INTERNAL FIXATION (ORIF) METACARPAL Right 08/12/2017   Procedure: OPEN REDUCTION INTERNAL FIXATION (ORIF) 5th METACARPAL fracture;  Surgeon: Roseanne Kaufman, MD;  Location: [facility];  Service: Orthopedics;  Laterality: Right;  60 mins   SPINAL FIXATION SURGERY  1973   age 89 - sclosis   SUBMUCOSAL TATTOO  INJECTION  07/28/2022   Procedure: SUBMUCOSAL TATTOO INJECTION;  Surgeon: Irving Copas., MD;  Location: [facility];  Service: Gastroenterology;;   TOTAL HIP ARTHROPLASTY Right 06-16-2010 '@[facility]'$    UPPER GASTROINTESTINAL ENDOSCOPY     VSD REPAIR  age 66   VULVECTOMY N/A 09/18/2015   Procedure: WIDE EXCISION VULVECTOMY;  Surgeon: Vanessa Kick, MD;  Location: [facility];  Service: Gynecology;  Laterality: N/A;    Social History:  reports that she quit smoking about 19 years ago. Her smoking use included cigarettes. She has a 20.00 pack-year smoking history. She has never used smokeless tobacco. She reports current alcohol use. She reports that she does not use drugs.  Allergies:  Allergies  Allergen Reactions   Tape Itching and Other (See Comments)    Redness     Medications Prior to Admission  Medication Sig Dispense Refill   acetaminophen (TYLENOL) 650 MG CR tablet Take 1,300 mg by mouth 2 (two) times daily as needed for pain.  Collagen-Vitamin C-Biotin (COLLAGEN 1500/C PO) Take 1 capsule by mouth daily.     diltiazem (CARDIZEM CD) 180 MG 24 hr capsule Take 1 capsule (180 mg total) by mouth daily. 90 capsule 3   estrogens-methylTEST 1.25-2.5 MG TABS tablet Take 1 tablet by mouth daily.     ferrous sulfate 325 (65 FE) MG EC tablet Take 1 tablet (325 mg total) by mouth 2 (two) times daily. (Patient taking differently: Take 325 mg by mouth every other day.) 60 tablet 3   medroxyPROGESTERone (PROVERA) 5 MG tablet Take 5 mg by mouth daily.     OVER THE COUNTER MEDICATION Take 2 capsules by mouth daily. Zinc, Vitamin C, and Vitamin D     pantoprazole (PROTONIX) 40 MG tablet Take 1 tablet (40 mg total) by mouth 2 (two) times daily. 90 tablet 3   sucralfate (CARAFATE) 1 g tablet Take 1 tablet (1 g total) by mouth 2 (two) times daily for 14 days. 28 tablet 0   traMADol (ULTRAM) 50 MG tablet Take 100 mg by mouth 2 (two) times daily.      Blood pressure 101/67, pulse 94, temperature 98.8 F  (37.1 C), temperature source Oral, resp. rate 12, height '5\' 2"'$  (1.575 m), weight 88.6 kg, SpO2 98 %. Physical Exam:  General: pleasant, WD, emale who is laying in bed in NAD HEENT: head is normocephalic, atraumatic.  Sclera are noninjected.  PERRL.  Ears and nose without any masses or lesions.  Mouth is pink and moist Heart: Tachycardic with irregular rate.   Lungs: CTAB, no wheezes, rhonchi, or rales noted.  Respiratory effort nonlabored Abd: Soft, NT, ND, +BS MS: no LE edema Skin: warm and dry with no masses, lesions, or rashes Neuro: Cranial nerves 2-12 grossly intact, MAE's, normal speech, thought process intact Psych: A&Ox3 with an appropriate affect  Results for orders placed or performed during the hospital encounter of 07/31/2022 (from the past 48 hour(s))  Hemoglobin and hematocrit, blood     Status: Abnormal   Collection Time: 07/27/2022  4:39 PM  Result Value Ref Range   Hemoglobin 9.9 (L) 12.0 - 15.0 g/dL   HCT 32.4 (L) 36.0 - 46.0 %    Comment: Performed at [facility], [place] [address] [place], [place] [address]  Glucose, capillary     Status: Abnormal   Collection Time: 07/24/2022  6:16 PM  Result Value Ref Range   Glucose-Capillary 107 (H) 70 - 99 mg/dL    Comment: Glucose reference range applies only to samples taken after fasting for at least 8 hours.  Prepare RBC (crossmatch)     Status: None   Collection Time: 08/03/2022  6:27 PM  Result Value Ref Range   Order Confirmation      ORDER PROCESSED BY BLOOD BANK Performed at [facility], [place] [address], [place], [place] [address]   CBC     Status: Abnormal   Collection Time: 07/09/2022  9:14 PM  Result Value Ref Range   WBC 13.8 (H) 4.0 - 10.5 K/uL   RBC 2.26 (L) 3.87 - 5.11 MIL/uL   Hemoglobin 7.1 (L) 12.0 - 15.0 g/dL    Comment: REPEATED TO VERIFY DELTA CHECK NOTED    HCT 22.7 (L) 36.0 - 46.0 %   MCV 100.4 (H) 80.0 - 100.0 fL   MCH 31.4 26.0 - 34.0 pg   MCHC 31.3 30.0 - 36.0  g/dL   RDW 15.7 (H) 11.5 - 15.5 %   Platelets 339 150 -  400 K/uL   nRBC 0.0 0.0 - 0.2 %    Comment: Performed at [facility], [place] [address], [place], [place] [address]  Prepare RBC (crossmatch)     Status: None   Collection Time: 08/05/22 12:15 AM  Result Value Ref Range   Order Confirmation      ORDER PROCESSED BY BLOOD BANK Performed at [facility], [place] [address], [place], [place] [address]   Renal function panel     Status: Abnormal   Collection Time: 08/05/22  7:21 AM  Result Value Ref Range   Sodium 141 135 - 145 mmol/L   Potassium 3.7 3.5 - 5.1 mmol/L   Chloride 111 98 - 111 mmol/L   CO2 23 22 - 32 mmol/L   Glucose, Bld 88 70 - 99 mg/dL    Comment: Glucose reference range applies only to samples taken after fasting for at least 8 hours.   BUN 11 8 - 23 mg/dL   Creatinine, Ser 0.66 0.44 - 1.00 mg/dL   Calcium 7.7 (L) 8.9 - 10.3 mg/dL   Phosphorus 3.2 2.5 - 4.6 mg/dL   Albumin 2.6 (L) 3.5 - 5.0 g/dL   GFR, Estimated >60 >60 mL/min    Comment: (NOTE) Calculated using the CKD-EPI Creatinine Equation (2021)    Anion gap 7 5 - 15    Comment: Performed at [facility], [place] [address], [place], [place] [address]  Magnesium     Status: None   Collection Time: 08/05/22  7:21 AM  Result Value Ref Range   Magnesium 1.8 1.7 - 2.4 mg/dL    Comment: Performed at [facility], [place] [address] [place], [place] [address]  CBC     Status: Abnormal   Collection Time: 08/05/22  7:21 AM  Result Value Ref Range   WBC 7.6 4.0 - 10.5 K/uL   RBC 3.04 (L) 3.87 - 5.11 MIL/uL   Hemoglobin 9.4 (L) 12.0 - 15.0 g/dL    Comment: REPEATED TO VERIFY POST TRANSFUSION SPECIMEN DELTA CHECK NOTED    HCT 29.2 (L) 36.0 - 46.0 %   MCV 96.1 80.0 - 100.0 fL   MCH 30.9 26.0 - 34.0 pg   MCHC 32.2 30.0 - 36.0 g/dL   RDW 16.9 (H) 11.5 - 15.5 %   Platelets 307 150 - 400 K/uL   nRBC 0.0 0.0 - 0.2 %    Comment: Performed at  [facility], [place] [address], [place], [place] [address]  Hemoglobin and hematocrit, blood     Status: Abnormal   Collection Time: 08/05/22 11:02 AM  Result Value Ref Range   Hemoglobin 9.5 (L) 12.0 - 15.0 g/dL   HCT 29.3 (L) 36.0 - 46.0 %    Comment: Performed at [facility], [place] [address], [place], [place] [address]  Hepatic function panel     Status: Abnormal   Collection Time: 08/05/22 11:02 AM  Result Value Ref Range   Total Protein 4.9 (L) 6.5 - 8.1 g/dL   Albumin 2.6 (L) 3.5 - 5.0 g/dL   AST 12 (L) 15 - 41 U/L   ALT 13 0 - 44 U/L   Alkaline Phosphatase 41 38 - 126 U/L   Total Bilirubin 0.8 0.3 - 1.2 mg/dL   Bilirubin, Direct 0.2 0.0 - 0.2 mg/dL   Indirect Bilirubin 0.6 0.3 - 0.9 mg/dL    Comment: Performed at [facility], [place] [address] [place],  [address]  Hemoglobin and hematocrit, blood     Status: Abnormal   Collection Time: 08/05/22  3:06 PM  Result Value Ref Range   Hemoglobin 9.1 (L) 12.0 - 15.0 g/dL   HCT 27.6 (L) 36.0 - 46.0 %    Comment: Performed at [facility], [place] [address] [place], [place] [address]  Hemoglobin and hematocrit, blood     Status: Abnormal   Collection Time: 08/05/22  8:26 PM  Result Value Ref Range   Hemoglobin 9.2 (L) 12.0 - 15.0 g/dL   HCT 27.7 (L) 36.0 - 46.0 %    Comment: Performed at [facility], [place] [address] [place], [place] [address]  CBC with Differential/Platelet     Status: Abnormal   Collection Time: 07/26/2022  2:51 AM  Result Value Ref Range   WBC 8.1 4.0 - 10.5 K/uL   RBC 2.68 (L) 3.87 - 5.11 MIL/uL   Hemoglobin 8.5 (L) 12.0 - 15.0 g/dL   HCT 26.0 (L) 36.0 - 46.0 %   MCV 97.0 80.0 - 100.0 fL   MCH 31.7 26.0 - 34.0 pg   MCHC 32.7 30.0 - 36.0 g/dL   RDW 16.8 (H) 11.5 - 15.5 %   Platelets 294 150 - 400 K/uL   nRBC 0.0 0.0 - 0.2 %   Neutrophils Relative % 57 %   Neutro Abs 4.8 1.7 - 7.7 K/uL   Lymphocytes Relative 28  %   Lymphs Abs 2.2 0.7 - 4.0 K/uL   Monocytes Relative 10 %   Monocytes Absolute 0.8 0.1 - 1.0 K/uL   Eosinophils Relative 3 %   Eosinophils Absolute 0.2 0.0 - 0.5 K/uL   Basophils Relative 1 %   Basophils Absolute 0.0 0.0 - 0.1 K/uL   Immature Granulocytes 1 %   Abs Immature Granulocytes 0.08 (H) 0.00 - 0.07 K/uL    Comment: Performed at [facility], [place] [address], [place], [place] [address]  Comprehensive metabolic panel     Status: Abnormal   Collection Time: 07/17/2022  5:21 AM  Result Value Ref Range   Sodium 138 135 - 145 mmol/L   Potassium 3.6 3.5 - 5.1 mmol/L   Chloride 109 98 - 111 mmol/L   CO2 22 22 - 32 mmol/L   Glucose, Bld 84 70 - 99 mg/dL    Comment: Glucose reference range applies only to samples taken after fasting for at least 8 hours.   BUN 8 8 - 23 mg/dL   Creatinine, Ser 0.58 0.44 - 1.00 mg/dL   Calcium 8.1 (L) 8.9 - 10.3 mg/dL   Total Protein 4.6 (L) 6.5 - 8.1 g/dL   Albumin 2.3 (L) 3.5 - 5.0 g/dL   AST 11 (L) 15 - 41 U/L   ALT 10 0 - 44 U/L   Alkaline Phosphatase 37 (L) 38 - 126 U/L   Total Bilirubin 0.6 0.3 - 1.2 mg/dL   GFR, Estimated >60 >60 mL/min    Comment: (NOTE) Calculated using the CKD-EPI Creatinine Equation (2021)    Anion gap 7 5 - 15    Comment: Performed at [facility], [place] [address], [place], [place] [address]  Magnesium     Status: None   Collection Time: 07/27/2022  5:21 AM  Result Value Ref Range   Magnesium 1.7 1.7 - 2.4 mg/dL    Comment: Performed at [facility], [place] [address], [place], [place] [address]  Phosphorus     Status: None  Collection Time: 07/28/2022  5:21 AM  Result Value Ref Range   Phosphorus 3.0 2.5 - 4.6 mg/dL    Comment: Performed at [facility], [place] [address], [place], [place] [address]  Hemoglobin and hematocrit, blood     Status: Abnormal   Collection Time: 07/09/2022  7:37 AM  Result Value Ref Range   Hemoglobin 9.5 (L) 12.0 - 15.0  g/dL   HCT 29.3 (L) 36.0 - 46.0 %    Comment: Performed at [facility], [place] [address] [place], [place] [address]  Type and screen [place]     Status: None (Preliminary result)   Collection Time: 07/29/2022  7:37 AM  Result Value Ref Range   ABO/RH(D) AB POS    Antibody Screen NEG    Sample Expiration 08/09/2022,2359    Unit Number [id number]    Blood Component Type RED CELLS,LR    Unit division 00    Status of Unit ISSUED    Transfusion Status OK TO TRANSFUSE    Crossmatch Result Compatible    Unit Number [id number]    Blood Component Type RED CELLS,LR    Unit division 00    Status of Unit ISSUED    Transfusion Status OK TO TRANSFUSE    Crossmatch Result      Compatible Performed at [facility], [place] [address] [place], [place] [address]   Prepare RBC (crossmatch)     Status: None   Collection Time: 07/19/2022  7:50 AM  Result Value Ref Range   Order Confirmation      ORDER PROCESSED BY BLOOD BANK Performed at [facility], [place] [address], [place], [place] [address]   DIC Panel ONCE - STAT     Status: Abnormal (Preliminary result)   Collection Time: 07/23/2022  3:42 PM  Result Value Ref Range   Prothrombin Time 15.3 (H) 11.4 - 15.2 seconds   INR 1.2 0.8 - 1.2    Comment: (NOTE) INR goal varies based on device and disease states.    aPTT 27 24 - 36 seconds   Fibrinogen 478 (H) 210 - 475 mg/dL    Comment: (NOTE) Fibrinogen results may be underestimated in patients receiving thrombolytic therapy.    D-Dimer, Quant 2.25 (H) 0.00 - 0.50 ug/mL-FEU    Comment: (NOTE) At the manufacturer cut-off value of 0.5 g/mL FEU, this assay has a negative predictive value of 95-100%.This assay is intended for use in conjunction with a clinical pretest probability (PTP) assessment model to exclude pulmonary embolism (PE) and deep venous thrombosis (DVT) in outpatients suspected of PE or DVT. Results  should be correlated with clinical presentation.    Platelets 309 150 - 400 K/uL    Comment: Performed at [facility], [place] [address], [place], [place] [address]   Smear Review PENDING   Hemoglobin and hematocrit, blood     Status: Abnormal   Collection Time: 07/20/2022  3:43 PM  Result Value Ref Range   Hemoglobin 10.8 (L) 12.0 - 15.0 g/dL   HCT 32.1 (L) 36.0 - 46.0 %    Comment: Performed at [facility], [place] [address], [place],  [address]   NM GI Blood Loss  Result Date: 07/21/2022 CLINICAL DATA:  Bright red blood per rectum. Evaluate for active gastrointestinal bleeding. EXAM: NUCLEAR MEDICINE GASTROINTESTINAL BLEEDING SCAN TECHNIQUE: Sequential abdominal images were obtained following intravenous administration of Tc-66mlabeled red blood cells. RADIOPHARMACEUTICALS:  20.3 mCi Tc-912mertechnetate  in-vitro labeled red cells. COMPARISON:  CT angiogram 07/28/2022 FINDINGS: 2 hours of planar imaging were performed. No accumulation of tagged red blood cells within the small bowel or colon. Physiologic activity noted within the blood pool and solid organs. IMPRESSION: No evidence of active gastrointestinal bleeding over 2 hours imaging. Electronically Signed   By: Suzy Bouchard M.D.   On: 07/18/2022 14:38   DG CHEST PORT 1 VIEW  Result Date: 07/20/2022 CLINICAL DATA:  [id number] Encounter for central line placement [id number] EXAM: PORTABLE CHEST 1 VIEW COMPARISON:  08/05/2022 chest radiograph. FINDINGS: Right PICC terminates over the cavoatrial junction. Partially visualized surgical hardware from ACDF. Spinal cerclage wire overlies the upper thoracic spine. Stable cardiomediastinal silhouette with top-normal heart size. No pneumothorax. No pleural effusion. Lungs appear clear, with no acute consolidative airspace disease and no pulmonary edema. IMPRESSION: Right PICC terminates over the cavoatrial junction. No pneumothorax. No active cardiopulmonary  disease. Electronically Signed   By: Ilona Sorrel M.D.   On: 07/20/2022 11:49   [place] EKG SITE RITE  Result Date: 07/27/2022 If Site Rite image not attached, placement could not be confirmed due to current cardiac rhythm.  CT ANGIO GI BLEED  Result Date: 07/12/2022 CLINICAL DATA:  Gastrointestinal bleeding.  Colonoscopy today. EXAM: CTA ABDOMEN AND PELVIS WITHOUT AND WITH CONTRAST TECHNIQUE: Multidetector CT imaging of the abdomen and pelvis was performed using the standard protocol during bolus administration of intravenous contrast. Multiplanar reconstructed images and MIPs were obtained and reviewed to evaluate the vascular anatomy. RADIATION DOSE REDUCTION: This exam was performed according to the departmental dose-optimization program which includes automated exposure control, adjustment of the mA and/or kV according to patient size and/or use of iterative reconstruction technique. CONTRAST:  1107m OMNIPAQUE IOHEXOL 350 MG/ML SOLN COMPARISON:  August 02, 2022. FINDINGS: VASCULAR Aorta: Atherosclerosis of abdominal aorta is noted without aneurysm or dissection. Celiac: Patent without evidence of aneurysm, dissection, vasculitis or significant stenosis. SMA: Patent without evidence of aneurysm, dissection, vasculitis or significant stenosis. Renals: Both renal arteries are patent without evidence of aneurysm, dissection, vasculitis, fibromuscular dysplasia or significant stenosis. IMA: Patent without evidence of aneurysm, dissection, vasculitis or significant stenosis. Inflow: Status post right aortofemoral bypass graft which is widely patent. Left iliac arteries are widely patent. Proximal Outflow: Bilateral common femoral and visualized portions of the superficial and profunda femoral arteries are patent without evidence of aneurysm, dissection, vasculitis or significant stenosis. Veins: No obvious venous abnormality within the limitations of this arterial phase study. Review of the MIP images confirms  the above findings. NON-VASCULAR Lower chest: Minimal scarring is seen involving the visualized lung bases. Moderate size sliding-type hiatal hernia is noted. Hepatobiliary: No focal liver abnormality is seen. Status post cholecystectomy. No biliary dilatation. Pancreas: Unremarkable. No pancreatic ductal dilatation or surrounding inflammatory changes. Spleen: Normal in size without focal abnormality. Adrenals/Urinary Tract: Adrenal glands appear normal. No hydronephrosis or renal obstruction is noted. Urinary bladder is unremarkable. Low density is seen involving the inferior pole of the left kidney concerning for renal infarction or pyelonephritis. Stomach/Bowel: There is no evidence of bowel obstruction or inflammation. There is no definite evidence of active gastrointestinal bleeding. Stool is noted in the right colon. Lymphatic: No adenopathy is noted. Reproductive: Uterus and bilateral adnexa are unremarkable. Other: No abdominal wall hernia or abnormality. No abdominopelvic ascites. Musculoskeletal: Status post right hip arthroplasty. No acute osseous abnormality is noted. IMPRESSION: VASCULAR No definite evidence of active gastrointestinal bleeding. Atherosclerosis of abdominal aorta is noted without aneurysm or dissection. Patent right  aortofemoral bypass graft is noted. NON-VASCULAR Low density is seen involving the inferior pole of the left kidney concerning for renal infarction or possibly pyelonephritis. This is new since prior exam. Electronically Signed   By: Marijo Conception M.D.   On: 07/14/2022 19:35    Anti-infectives (From admission, onward)    None        Assessment/Plan GI bleeding of unknown source Known hx of small bowel AVMs - S/P small bowel enteroscopy 07/28/2022 identified a bleeding AVM in the duodenum with erosion as well as a separate single distal duodenal AVM both of which were ablated. She also had a clip placed in the area of a possible subepithelial lesion versus  angulation. - Repeat small bowel enteroscopy showed erosions in the 3rd and 4th portions of the duodenum at prior APC sites without active bleeding.  - Colonoscopy 11/28 without active source of bleeding  - GI following. DDAVP x 1 this am. She is on octreotide and PPI currently. - Tagged RBC scan negative earlier today, CTA negative on 11/26 and 11/28 - Per notes GI plans repeat CTA if she has any further bloody bm's and have IR on standby for possible fluoroscopic angiography if indicated. She is scheduled for a Meckle's scan tomorrow.  - GI suspects the AVM in distal small bowel seen on capsule endoscopy is the source of bleeding and is to distal to reach with push enteroscopy. They have spoken with [facility] for retrograde enteroscopy and double balloon enteroscopy or retrograde enteroscopy but were declined. I am unable to see these picture from capsule endoscopy. Intraluminal bleeding would be very difficult to identify so would ask GI to see if they can narrow down exactly where this is located so if she does require an emergent resection we have a better chance at localizing this area. For now I do not think she needs any emergent surgery.  Her hgb is currently 10.8 and she is not hypotensive. Agree with GI's current plans as noted above. We will continue to follow along.  ABL anemia - secondary to above, h/h 10.8/32.1 this afternoon from 9.5/29.3 this AM, s/p 2 units 11/28 and 11/30  FEN - Per GI VTE - SCDs, on hold ID - None  Atrial fibrillation not on anticoagulation  GERD Hx of MVP Scoliosis/lumbar spondylosis Osteoarthritis    Alferd Apa, [facility] 07/08/2022, 4:10 PM Please see Amion for pager number during day hours 7:00am-4:30pm

## 2022-08-06 NOTE — Progress Notes (Addendum)
[facility] Progress Note  CC:  GI bleed, anemia   Subjective: Patient actively bleeding this morning with hematemesis and currently passing a large amount of red blood per the rectum.  She endorses having new upper abdominal pain which started this morning. She had a brief syncopal episode with hypotension after vomiting this morning as reported by her RN.  No CP or SOB.Dr. Alfredia Ferguson and husband at the bedside.   Objective:  Vital signs in last 24 hours: Temp:  [97.9 F (36.6 C)-99 F (37.2 C)] 97.9 F (36.6 C) (11/30 0300) Pulse Rate:  [92-115] 97 (11/30 0500) Resp:  [12-32] 17 (11/30 0500) BP: (87-121)/(40-79) 114/58 (11/30 0500) SpO2:  [94 %-98 %] 95 % (11/30 0500) Last BM Date : 07/09/2022 General: Fatigued ill-appearing 61 year old female.  Heart: Tachycardic, no murmurs. Pulm: Breath sounds clear throughout. Abdomen: Soft, nondistended.  Tenderness to the epigastric area and lower abdomen without rebound or guarding.  Positive bowel sounds to all 4 quadrants.  Bed pad with a large amount of dark and bright red blood without clots.  Extremities: No lower extremity edema. Neurologic:  Alert and  oriented x 4.  Moves all extremities weakly.  Answers questions appropriately. Psych:  Alert and cooperative. Normal mood and affect.  Intake/Output from previous day: 11/29 0701 - 11/30 0700 In: 100 [P.O.:100] Out: -  Intake/Output this shift: No intake/output data recorded.  Lab Results: Recent Labs    07/23/2022 2114 08/05/22 0721 08/05/22 1102 08/05/22 1506 08/05/22 2026 07/09/2022 0251  WBC 13.8* 7.6  --   --   --  8.1  HGB 7.1* 9.4*   < > 9.1* 9.2* 8.5*  HCT 22.7* 29.2*   < > 27.6* 27.7* 26.0*  PLT 339 307  --   --   --  294   < > = values in this interval not displayed.   BMET Recent Labs    08/05/22 0721 07/23/2022 0521  NA 141 138  K 3.7 3.6  CL 111 109  CO2 23 22  GLUCOSE 88 84  BUN 11 8  CREATININE 0.66 0.58  CALCIUM 7.7* 8.1*    LFT Recent Labs    08/05/22 1102 07/26/2022 0521  PROT 4.9* 4.6*  ALBUMIN 2.6* 2.3*  AST 12* 11*  ALT 13 10  ALKPHOS 41 37*  BILITOT 0.8 0.6  BILIDIR 0.2  --   IBILI 0.6  --    PT/INR No results for input(s): "LABPROT", "INR" in the last 72 hours. Hepatitis Panel No results for input(s): "HEPBSAG", "HCVAB", "HEPAIGM", "HEPBIGM" in the last 72 hours.  CT ANGIO GI BLEED  Result Date: 07/16/2022 CLINICAL DATA:  Gastrointestinal bleeding.  Colonoscopy today. EXAM: CTA ABDOMEN AND PELVIS WITHOUT AND WITH CONTRAST TECHNIQUE: Multidetector CT imaging of the abdomen and pelvis was performed using the standard protocol during bolus administration of intravenous contrast. Multiplanar reconstructed images and MIPs were obtained and reviewed to evaluate the vascular anatomy. RADIATION DOSE REDUCTION: This exam was performed according to the departmental dose-optimization program which includes automated exposure control, adjustment of the mA and/or kV according to patient size and/or use of iterative reconstruction technique. CONTRAST:  152m OMNIPAQUE IOHEXOL 350 MG/ML SOLN COMPARISON:  August 02, 2022. FINDINGS: VASCULAR Aorta: Atherosclerosis of abdominal aorta is noted without aneurysm or dissection. Celiac: Patent without evidence of aneurysm, dissection, vasculitis or significant stenosis. SMA: Patent without evidence of aneurysm, dissection, vasculitis or significant stenosis. Renals: Both renal arteries are patent without evidence of aneurysm, dissection, vasculitis, fibromuscular  dysplasia or significant stenosis. IMA: Patent without evidence of aneurysm, dissection, vasculitis or significant stenosis. Inflow: Status post right aortofemoral bypass graft which is widely patent. Left iliac arteries are widely patent. Proximal Outflow: Bilateral common femoral and visualized portions of the superficial and profunda femoral arteries are patent without evidence of aneurysm, dissection, vasculitis  or significant stenosis. Veins: No obvious venous abnormality within the limitations of this arterial phase study. Review of the MIP images confirms the above findings. NON-VASCULAR Lower chest: Minimal scarring is seen involving the visualized lung bases. Moderate size sliding-type hiatal hernia is noted. Hepatobiliary: No focal liver abnormality is seen. Status post cholecystectomy. No biliary dilatation. Pancreas: Unremarkable. No pancreatic ductal dilatation or surrounding inflammatory changes. Spleen: Normal in size without focal abnormality. Adrenals/Urinary Tract: Adrenal glands appear normal. No hydronephrosis or renal obstruction is noted. Urinary bladder is unremarkable. Low density is seen involving the inferior pole of the left kidney concerning for renal infarction or pyelonephritis. Stomach/Bowel: There is no evidence of bowel obstruction or inflammation. There is no definite evidence of active gastrointestinal bleeding. Stool is noted in the right colon. Lymphatic: No adenopathy is noted. Reproductive: Uterus and bilateral adnexa are unremarkable. Other: No abdominal wall hernia or abnormality. No abdominopelvic ascites. Musculoskeletal: Status post right hip arthroplasty. No acute osseous abnormality is noted. IMPRESSION: VASCULAR No definite evidence of active gastrointestinal bleeding. Atherosclerosis of abdominal aorta is noted without aneurysm or dissection. Patent right aortofemoral bypass graft is noted. NON-VASCULAR Low density is seen involving the inferior pole of the left kidney concerning for renal infarction or possibly pyelonephritis. This is new since prior exam. Electronically Signed   By: Marijo Conception M.D.   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 S/P small bowel enteroscopy 07/28/2022 identified a bleeding AVM in the duodenum with erosion as  well as a separate single distal duodenal AVM both of which were ablated. She also had a clip placed in the area of a possible subepithelial lesion versus angulation.  Her hemoglobin remained stable and she was discharged home on Sucralfate and PPI.  She was readmitted to the hospital 07/09/2022 with recurrent GI bleed and anemia. CTA was negative. She underwent a second small bowel enteroscopy with findings of a 5 cm hiatal hernia evidence of mild retch gastropathy, there were erosions in the third portion of the duodenum and fourth portion of the duodenum corresponding to the prior APC sites The previously placed hemoclip was found in the third portion of the duodenum with no evidence of active bleeding. Capsule was placed endoscopically and again no active bleeding noted in the proximal and mid small bowel, there was 1 small nonbleeding AVM noted and there was trace amount of heme at the prior hemoclip site but no active bleeding.  In the more distal small bowel there was gross melena which continued throughout into the visualized colon, no lesion visible  Colonoscopy 07/23/2022 showed no active bleeding found in the colon, there was melena in the colon which was lavaged, some melanotic aspirate also noted in terminal ileum and a few medium sized diverticuli noted in the sigmoid and ascending colon. She passed a large amount of blood per the rectum post colonoscopy which resulted in hypotension, Hg dropped to 7.1 and she was transferred to the ICU. Transfused 2 units of PRBCs -> Hg 9.4. Stat CTA was negative for  active GI bleeding. This morning around 7:15am she had active hematemesis and red blood per the rectum with associated brief episode of syncope, hypotension and tachycardia.  Hg 8.5 (2am) -> Hg 9.5 (7:37am). She received IV fluid bolus, 2 units of PRBCs ordered by Dr. Alfredia Ferguson. -NPO -Meckel scan canceled -Tagged red blood cell scan.  RN to travel with patient to nuclear med. -Repeat small bowel  enteroscopy this afternoon if tagged RBC scan negative  -Agree with PRBC transfusion x 2 units -Check H/H post transfusion and Q 6 hours x 24 hours  -Octreotide 50 mcg bolus followed by 50 mcg/hour infusion -Pharmacy consult for DDVAP dose x 1 -Continue IV PPI bid -Continue Compazine '10mg'$  IV Q 6 hrs PRN -Continue Estrogen/Progesterone -Plan was to transfer patient to [facility] for retrograde enteroscopy, however, [facility] declined transfer due to extremely limited availability for double balloon enteroscopy or retrograde enteroscopy. [facility] or [facility] do not do provide these procedures.  -Await further recommendations per Dr. Lyndel Safe ADDENDUM: Tagged RBC scan was delayed earlier today as the patient was hemodynamically unstable in setting of recurrent large volume red blood with large clots from the rectum. She received 2 units of PRBs and IV fluids and her BP stabilized. Tagged RBC scan in process at this time. I contacted IR and reviewed case with radiologist Dr. Michaelle Birks who discussed if her tagged RBC scan is positive she will need a stat CTA to accurately target the area of bleeding required for fluoroscopic angiography.  2) Anemia, secondary to # 1. Transfused 2 units of PRBCs 11/28 -08/05/2022. Two additional units of PRBCs ordered today as noted above.  3) GERD -Continue PPI IV bid  4) Atrial fibrillation, not on anticoagulation              Principal Problem:   Acute GI bleeding Active Problems:   Osteoarthritis   GERD (gastroesophageal reflux disease)   Spinal stenosis   Hematemesis with nausea   Paroxysmal atrial fibrillation (HCC)   Class II obesity   Acute blood loss anemia   Dark stools     LOS: 4 days   Noralyn Pick  08/05/2022, 8:52AM   Attending physician's note   I have taken history, reviewed the chart and examined the patient. I performed a substantive portion of this encounter, including complete performance of at least one of the  key components, in conjunction with the APP. I agree with the Advanced Practitioner's note, impression and recommendations.   Continued episodic recurrent GI bleed - most likely SB source, distal to reach of push enteroscopy (neg SB push enteroscopy x 2), distal to reach of single balloon enteroscopy, neg colon with deep TI intubation.   [facility] declined transfer d/t non-availability on beds and very limited availability of DBE.  DBE not available at [place] x 2, tagged RBC scan today.  Canceled repeat enteroscopy as 2 recent were neg.   Plan: -Full liq diet. -Meckel's scan in AM. -Stat CTA if/when active bleeding. IR to stand by, if +ve for embolization. -Would ask for Sx consult. Pt's family knows Dr Scherrie Merritts -Have 2U PRBC on hold at all times x 72 hrs -DDAVP x 1 given this AM when she had episode of active bleeding. -Also IV octreotide initiated.   Carmell Austria, MD [facility] [phone]

## 2022-08-06 NOTE — Consult Note (Signed)
NAME:  [name], MRN:  [id number], DOB:  Jun 09, 1961, LOS: 4 ADMISSION DATE:  07/20/2022, CONSULTATION DATE:  08/01/2022 REFERRING MD:  Alfredia Ferguson, CHIEF COMPLAINT:  BRBPR   History of Present Illness:  This is a 62 year old woman with a history of recurrent severe GI bleeds thought to be originating from small bowel AVMs who is presenting with another GI bleed.  Please see GI procedure notes over the past couple months as she has had multiple EGDs, colonoscopy, and small bowel enteroscopy.  She has been here couple days getting worked up for this recurrence when overnight she began having more brisk bleeding with associated hypotension.  PCCM consulted to help manage.  Currently denies dizziness, abdominal pain.  Has had a couple episodes of hematemesis overnight.  Ongoing bright red blood from rectum during evaluation.  Pertinent  Medical History  reflux Mitral valve prolapse Osteoarthritis  Significant Hospital Events: Including procedures, antibiotic start and stop dates in addition to other pertinent events   11/26 admit, CTA negative 11/26 small bowel enteroscopy no active bleeding seen, multiple sites of prior hemoclips in small bowel 11/28 colonoscopy no active bleeding, melena in colon, medium diverticuli 11/30 PCCM consult  Interim History / Subjective:  Consulted  Objective   Blood pressure (!) 70/31, pulse (!) 114, temperature 97.9 F (36.6 C), temperature source Oral, resp. rate 15, height '5\' 2"'$  (1.575 m), weight 88.6 kg, SpO2 96 %.        Intake/Output Summary (Last 24 hours) at 07/15/2022 0913 Last data filed at 07/26/2022 0900 Gross per 24 hour  Intake 383.05 ml  Output --  Net 383.05 ml   Filed Weights   07/23/2022 1104 08/03/2022 1124 07/09/2022 1401  Weight: 88.6 kg 88.6 kg 88.6 kg    Examination: General: No acute distress lying in bed HENT: Mucous membranes are moist, trachea midline Lungs: Lung sounds are clear, no accessory muscle  use Cardiovascular: Tachycardic, strong peripheral pulses Abdomen: Soft, positive bowel sounds, bright red blood noted soaking sheets Extremities: No edema Neuro: Moves all 4 extremities to command Skin pale  Resolved Hospital Problem list   Not applicable  Assessment & Plan:  Shock-recurrent vagal events versus hemorrhagic Recurrent GI bleed with history of small bowel AVMs Poor vascular access  -Will place arterial line to get better idea of what her true pressures are; can use levophed PRN; this really seems vagal in nature -PICC versus central line - PPI/octreotide -Balanced transfusion strategy, avoid hypothermia, acidemia, coagulopathy - Nuclear scan ordered by GI - IR aware of patient, will be tough to know where to target with neg CTA  Best Practice (right click and "Reselect all SmartList Selections" daily)   Diet/type: NPO DVT prophylaxis: SCD GI prophylaxis: PPI Lines: Central line Foley:  N/A Code Status:  full code Last date of multidisciplinary goals of care discussion [pending]  Labs   CBC: Recent Labs  Lab 07/20/2022 0845 07/30/2022 1341 08/03/22 0108 08/03/22 0828 07/29/2022 2114 08/05/22 0721 08/05/22 1102 08/05/22 1506 08/05/22 2026 07/26/2022 0251 08/01/2022 0737  WBC 13.5*  --  8.1  --  13.8* 7.6  --   --   --  8.1  --   NEUTROABS 10.8*  --   --   --   --   --   --   --   --  4.8  --   HGB 10.9*   < > 9.0*  9.1*   < > 7.1* 9.4* 9.5* 9.1* 9.2* 8.5* 9.5*  HCT 33.7*   < >  28.1*  28.7*   < > 22.7* 29.2* 29.3* 27.6* 27.7* 26.0* 29.3*  MCV 97.7  --  97.6  --  100.4* 96.1  --   --   --  97.0  --   PLT 334  --  312  --  339 307  --   --   --  294  --    < > = values in this interval not displayed.    Basic Metabolic Panel: Recent Labs  Lab 07/22/2022 0845 08/03/22 0108 08/05/22 0721 08/01/2022 0521  NA 142 141 141 138  K 4.0 3.6 3.7 3.6  CL 106 106 111 109  CO2 '26 27 23 22  '$ GLUCOSE 104* 86 88 84  BUN '10 9 11 8  '$ CREATININE 0.72 0.79 0.66 0.58   CALCIUM 8.9 8.5* 7.7* 8.1*  MG  --   --  1.8 1.7  PHOS  --   --  3.2 3.0   GFR: Estimated Creatinine Clearance: 75.4 mL/min (by C-G formula based on SCr of 0.58 mg/dL). Recent Labs  Lab 07/29/2022 0845 08/03/22 0108 07/10/2022 2114 08/05/22 0721 07/30/2022 0251  WBC 13.5* 8.1 13.8* 7.6 8.1  LATICACIDVEN 1.9  --   --   --   --     Liver Function Tests: Recent Labs  Lab 07/11/2022 0845 08/03/22 0108 08/05/22 0721 08/05/22 1102 07/09/2022 0521  AST 22 14*  --  12* 11*  ALT 18 14  --  13 10  ALKPHOS 41 37*  --  41 37*  BILITOT 1.0 0.6  --  0.8 0.6  PROT 6.6 5.6*  --  4.9* 4.6*  ALBUMIN 3.3* 2.9* 2.6* 2.6* 2.3*   No results for input(s): "LIPASE", "AMYLASE" in the last 168 hours. No results for input(s): "AMMONIA" in the last 168 hours.  ABG No results found for: "PHART", "PCO2ART", "PO2ART", "HCO3", "TCO2", "ACIDBASEDEF", "O2SAT"   Coagulation Profile: Recent Labs  Lab 08/03/2022 0845  INR 1.1    Cardiac Enzymes: No results for input(s): "CKTOTAL", "CKMB", "CKMBINDEX", "TROPONINI" in the last 168 hours.  HbA1C: No results found for: "HGBA1C"  CBG: Recent Labs  Lab 08/03/22 2101 07/16/2022 1816  GLUCAP 124* 107*    Review of Systems:    Positive Symptoms in bold:  Constitutional fevers, chills, weight loss, fatigue, anorexia, malaise  Eyes decreased vision, double vision, eye irritation  Ears, Nose, Mouth, Throat sore throat, trouble swallowing, sinus congestion  Cardiovascular chest pain, paroxysmal nocturnal dyspnea, lower ext edema, palpitations   Respiratory SOB, cough, DOE, hemoptysis, wheezing  Gastrointestinal nausea, vomiting, diarrhea  Genitourinary burning with urination, trouble urinating  Musculoskeletal joint aches, joint swelling, back pain  Integumentary  rashes, skin lesions  Neurological focal weakness, focal numbness, trouble speaking, headaches  Psychiatric depression, anxiety, confusion  Endocrine polyuria, polydipsia, cold intolerance,  heat intolerance  Hematologic abnormal bruising, abnormal bleeding, unexplained nose bleeds  Allergic/Immunologic recurrent infections, hives, swollen lymph nodes     Past Medical History:  She,  has a past medical history of Arthritis, Cancer ([place]), DDD (degenerative disc disease), lumbar, DDD (degenerative disc disease), lumbosacral, GERD (gastroesophageal reflux disease), History of avascular necrosis of capital femoral epiphysis, History of mitral valve prolapse, History of vulvar dysplasia, Iron deficiency anemia due to chronic blood loss (05/05/2022), Lumbar spondylosis, OA (osteoarthritis), PMB (postmenopausal bleeding), Scoliosis, and Wears glasses.   Surgical History:   Past Surgical History:  Procedure Laterality Date   ANTERIOR CERVICAL DECOMP/DISCECTOMY FUSION  11-25-2009  '@[facility]'$    C4 --- C7  ANTERIOR LAT LUMBAR FUSION Left 10/22/2016   Procedure: Anterior Lateral Lumbar Fusion - Lumbar two - lumbar three, with Lateral plate;  Surgeon: Kary Kos, MD;  Location: [facility];  Service: Neurosurgery;  Laterality: Left;  Anterior Lateral Lumbar Fusion - Lumbar two - lumbar three, with Lateral plate   CARDIAC CATHETERIZATION  age 57   CO2 LASER APPLICATION N/A 34/19/3790   Procedure: CO2 LASER APPLICATION;  Surgeon: Gus Height, MD;  Location: [facility];  Service: Gynecology;  Laterality: N/A;   COLONOSCOPY W/ POLYPECTOMY  06/21/2017   Dr.Perry   ENTEROSCOPY N/A 07/28/2022   Procedure: ENTEROSCOPY;  Surgeon: Rush Landmark Telford Nab., MD;  Location: [facility];  Service: Gastroenterology;  Laterality: N/A;   ENTEROSCOPY N/A 07/10/2022   Procedure: ENTEROSCOPY;  Surgeon: Rush Landmark Telford Nab., MD;  Location: [facility];  Service: Gastroenterology;  Laterality: N/A;   GIVENS CAPSULE STUDY N/A 08/03/2022   Procedure: GIVENS CAPSULE STUDY;  Surgeon: Irving Copas., MD;  Location: [facility];  Service: Gastroenterology;  Laterality: N/A;   HEMOSTASIS CLIP PLACEMENT  07/28/2022    Procedure: HEMOSTASIS CLIP PLACEMENT;  Surgeon: Irving Copas., MD;  Location: [facility];  Service: Gastroenterology;;   HOT HEMOSTASIS N/A 07/28/2022   Procedure: HOT HEMOSTASIS (ARGON PLASMA COAGULATION/BICAP);  Surgeon: Irving Copas., MD;  Location: [facility];  Service: Gastroenterology;  Laterality: N/A;   HYSTEROSCOPY WITH D & C N/A 08/27/2020   Procedure: DILATATION AND CURETTAGE /HYSTEROSCOPY;  Surgeon: Vanessa Kick, MD;  Location: [facility];  Service: Gynecology;  Laterality: N/A;   LAPAROSCOPIC CHOLECYSTECTOMY  04/2006   LEG SURGERY Right 1983   femoral artery repair from cardiac cath injury    OPEN REDUCTION INTERNAL FIXATION (ORIF) METACARPAL Right 08/12/2017   Procedure: OPEN REDUCTION INTERNAL FIXATION (ORIF) 5th METACARPAL fracture;  Surgeon: Roseanne Kaufman, MD;  Location: [facility];  Service: Orthopedics;  Laterality: Right;  60 mins   SPINAL FIXATION SURGERY  1973   age 82 - sclosis   SUBMUCOSAL TATTOO INJECTION  07/28/2022   Procedure: SUBMUCOSAL TATTOO INJECTION;  Surgeon: Irving Copas., MD;  Location: [facility];  Service: Gastroenterology;;   TOTAL HIP ARTHROPLASTY Right 06-16-2010 '@[facility]'$    UPPER GASTROINTESTINAL ENDOSCOPY     VSD REPAIR  age 9   VULVECTOMY N/A 09/18/2015   Procedure: WIDE EXCISION VULVECTOMY;  Surgeon: Vanessa Kick, MD;  Location: [facility];  Service: Gynecology;  Laterality: N/A;     Social History:   reports that she quit smoking about 19 years ago. Her smoking use included cigarettes. She has a 20.00 pack-year smoking history. She has never used smokeless tobacco. She reports current alcohol use. She reports that she does not use drugs.   Family History:  Her family history includes Alcoholism in her father; COPD in her mother; Diabetes in her father and another family member; Heart disease in an other family member; Pneumonia in her father. There is no history of Colon cancer, Esophageal cancer, or Stomach  cancer.   Allergies Allergies  Allergen Reactions   Tape Itching and Other (See Comments)    Redness      Home Medications  Prior to Admission medications   Medication Sig Start Date End Date Taking? Authorizing Provider  acetaminophen (TYLENOL) 650 MG CR tablet Take 1,300 mg by mouth 2 (two) times daily as needed for pain.   Yes [provider]  Collagen-Vitamin C-Biotin (COLLAGEN 1500/C PO) Take 1 capsule by mouth daily.   Yes [provider]  diltiazem (CARDIZEM CD)  180 MG 24 hr capsule Take 1 capsule (180 mg total) by mouth daily. 09/09/21  Yes Imogene Burn, PA-C  estrogens-methylTEST 1.25-2.5 MG TABS tablet Take 1 tablet by mouth daily.   Yes [provider]  ferrous sulfate 325 (65 FE) MG EC tablet Take 1 tablet (325 mg total) by mouth 2 (two) times daily. Patient taking differently: Take 325 mg by mouth every other day. 05/04/22  Yes [name], MD  medroxyPROGESTERone (PROVERA) 5 MG tablet Take 5 mg by mouth daily. 10/21/20  Yes [provider]  OVER THE COUNTER MEDICATION Take 2 capsules by mouth daily. Zinc, Vitamin C, and Vitamin D   Yes [provider]  pantoprazole (PROTONIX) 40 MG tablet Take 1 tablet (40 mg total) by mouth 2 (two) times daily. 07/29/22  Yes Danford, Suann Larry, MD  sucralfate (CARAFATE) 1 g tablet Take 1 tablet (1 g total) by mouth 2 (two) times daily for 14 days. 07/29/22 08/12/22 Yes Danford, Suann Larry, MD  traMADol (ULTRAM) 50 MG tablet Take 100 mg by mouth 2 (two) times daily.   Yes [provider]     Critical care time: 32 mins cc time

## 2022-08-06 NOTE — Progress Notes (Signed)
Arrive to [place] with patient.

## 2022-08-06 NOTE — Progress Notes (Signed)
Meckels scan planned for tomorrow around 1 pm (Per Nuc Med, need to wait 24 hours post the GI Bleed scan to let the radioactivity decay out). Pt will need to be NPO 6 hours prior (beginning around 7 am).

## 2022-08-06 NOTE — Progress Notes (Signed)
Blood required slowing down rate, patient stating pain at IV site, moved to different IV and slowed back down to 184m/hr. Slowly increased as tolerated. Stat IV team consult placed for additional site. Critical care team now at bedside. New IV placed by IV team. Orders received by critical care team to adjust blood rate to infused at 9931mhr. Orders carried out. Vital signs stable.

## 2022-08-07 ENCOUNTER — Encounter (HOSPITAL_COMMUNITY): Payer: Self-pay | Admitting: Gastroenterology

## 2022-08-07 ENCOUNTER — Inpatient Hospital Stay (HOSPITAL_COMMUNITY): Payer: PRIVATE HEALTH INSURANCE

## 2022-08-07 DIAGNOSIS — K922 Gastrointestinal hemorrhage, unspecified: Secondary | ICD-10-CM | POA: Diagnosis not present

## 2022-08-07 DIAGNOSIS — E669 Obesity, unspecified: Secondary | ICD-10-CM | POA: Diagnosis not present

## 2022-08-07 DIAGNOSIS — D62 Acute posthemorrhagic anemia: Secondary | ICD-10-CM | POA: Diagnosis not present

## 2022-08-07 DIAGNOSIS — R195 Other fecal abnormalities: Secondary | ICD-10-CM | POA: Diagnosis not present

## 2022-08-07 LAB — HEMOGLOBIN AND HEMATOCRIT, BLOOD
HCT: 28.1 % — ABNORMAL LOW (ref 36.0–46.0)
HCT: 29.8 % — ABNORMAL LOW (ref 36.0–46.0)
HCT: 28.8 % — ABNORMAL LOW (ref 36.0–46.0)
HCT: 28.4 % — ABNORMAL LOW (ref 36.0–46.0)
Hemoglobin: 9.3 g/dL — ABNORMAL LOW (ref 12.0–15.0)
Hemoglobin: 9.8 g/dL — ABNORMAL LOW (ref 12.0–15.0)
Hemoglobin: 9.5 g/dL — ABNORMAL LOW (ref 12.0–15.0)
Hemoglobin: 9.6 g/dL — ABNORMAL LOW (ref 12.0–15.0)

## 2022-08-07 LAB — BASIC METABOLIC PANEL
Anion gap: 5 (ref 5–15)
BUN: 11 mg/dL (ref 8–23)
CO2: 25 mmol/L (ref 22–32)
Calcium: 7.9 mg/dL — ABNORMAL LOW (ref 8.9–10.3)
Chloride: 107 mmol/L (ref 98–111)
Creatinine, Ser: 0.59 mg/dL (ref 0.44–1.00)
GFR, Estimated: >60 mL/min (ref >60)
Glucose, Bld: 123 mg/dL — ABNORMAL HIGH (ref 70–99)
Potassium: 4.2 mmol/L (ref 3.5–5.1)
Sodium: 137 mmol/L (ref 135–145)

## 2022-08-07 LAB — CBC
HCT: 29.3 % — ABNORMAL LOW (ref 36.0–46.0)
Hemoglobin: 9.4 g/dL — ABNORMAL LOW (ref 12.0–15.0)
MCH: 30.3 pg (ref 26.0–34.0)
MCHC: 32.1 g/dL (ref 30.0–36.0)
MCV: 94.5 fL (ref 80.0–100.0)
Platelets: 344 10*3/uL (ref 150–400)
RBC: 3.1 MIL/uL — ABNORMAL LOW (ref 3.87–5.11)
RDW: 18.2 % — ABNORMAL HIGH (ref 11.5–15.5)
WBC: 8.1 10*3/uL (ref 4.0–10.5)
nRBC: 0 % (ref 0.0–0.2)

## 2022-08-07 MED ORDER — SODIUM CHLORIDE 0.9% IV SOLUTION: Freq: Once | INTRAVENOUS | Status: AC

## 2022-08-07 MED ORDER — SODIUM PERTECHNETATE TC 99M INJECTION
10.4000 | Freq: Once | INTRAVENOUS | Status: AC
  Administered 2022-08-07: 10.4 via INTRAVENOUS

## 2022-08-07 NOTE — Progress Notes (Signed)
PROGRESS NOTE    [name]  YBO:[id number] DOB: April 19, 1961 DOA: 07/21/2022 PCP: Redmond School, MD   Brief Narrative:  Patient is an obese 61 year old Caucasian female with a past medical history significant for but not limited to proximal atrial fibrillation not on anticoagulation, history of iron deficiency anemia, osteoarthritis, GERD, history of GI bleed and duodenal AVM/telangiectasia presented with multiple episodes of hematemesis and rectal bleed and was not admitted due to GI bleeding.  Vitals were stable except for mild tachycardia and her presenting hemoglobin was 10.9 and it was 10.6 on the day that she was discharged on 07/30/2019 when she was discharged from the hospital.  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  Capsule endoscopy was done without active bleeding but she had 1 small nonbleeding AVM in the proximal small bowel.  She underwent a colonoscopy which felt that the small bowel was likely the source of her bleeding and preparation of the colon was inadequate due to melanotic stool throughout the colon.  She was found to have diverticulosis in the sigmoid colon and in the ascending colon examined portion ileum was normal.   Later in the day yesterday after her colonoscopy she had an active rectal bleed and had an episode of loss of consciousness blood pressure dropped and heart rate was 78.  She is transferred to the stepdown unit and a stat CT angio was done and was negative for any extravasation to suggest active bleeding.  And given 500 mL bolus and given 2 units of PRBCs.  GI was made aware of the situation and subsequently she is improved this morning with a hemoglobin being 9.4.  Her blood pressures remain the softer side but she feels okay.  The current plan was to try and get her transferred to a tertiary care  center at [facility] for a double-balloon enteroscopy however the GI new physicians declined and taking the patient stating extreme limited availability for double-balloon enteroscopy.  GI has now updated the plan to proceed with a Meckel scan in the morning and recommending continue aggressive supportive measures as well as transfusing as indicated.  GI is also considering a trial of octreotide if initiated then no need to plan to continue outpatient if there is no definitive source.  If the patient rebleeds prior to Meckel scan in the morning they are agreeable to proceed with a stat nuclear medicine bleeding scan.   **07/11/2022 had another syncopal episode this morning and had hematemesis and rectal bleeding.  She was given a 500 mL bolus and was typed and screened transfuse 2 more units of PRBCs.  GI was urgently consulted and her Meckel scan was discontinued and she was taken for a nuclear medicine bleeding scan.  Given her hypotension the PCCM team was consulted and they placed an arterial line for better blood pressure evaluation and also a PICC line was placed.  Further care pending the results of the nuclear medicine bleeding scan.   08/07/22: The nuclear medicine bleeding scan was negative and she stabilized.  She had a little bit of bleeding this morning with some cramping but no further syncope or lightheadedness.  She was less lethargic and was much more awake and alert and felt like her cell.  Tolerated her clinical diet and is going for Meckel scan today.  Surgery evaluated and felt no surgical intervention  at this time and given that her hemoglobin is overall stable and not requiring pressors they are going to continue to monitor and follow along.  Critical care following as well as recommending transfusion for hemoglobin less than 7 with hypotension and active bleeding.  Will continue PPI and octreotide for now  Assessment and Plan: No notes have been filed under this hospital  service. Service: Hospitalist  Acute GI Bleed/Reactive Gastropathy with history of small bowel AVMs -Presents with hematemesis and rectal bleed.  H&H at baseline.  Not on blood thinners or NSAID.  -Hemodynamically stable now I had no further evidence of syncope-EGD and capsule endoscopy have been done.   -She had iron infusion previous hospitalization. -Given her syncopal episodes she was typed and screened and transfused 4 units of PRBCs and had an emergent CTA which was negative for any extravasation to suggest active bleed -Monitor H&H every 6 hours -Continue IV Protonix -Diet has been advanced to full liquid diet per GI and subsequently changed to clear liquid diet -Colonoscopy done and preparation of the colon was inadequate due to melanotic stool throughout the colon but there is diverticulosis in the sigmoid colon and the ascending.  The examined portion of the ileum was normal and examination was otherwise normal on direct and retroflexion views.  GI felt that the likely source of her bleeding was small bowel and was beyond the reach of the pediatric colonoscope. -Patient's hemoglobin/hematocrit after 4 units has now improved and has relatively stabilized and has gone from 7.1/22.7 -> 9.4/29.2 -> 9.5/29.3 -> 9.1/27.6 -> 9.2/27.7 -> 8.5/26.0 -> 9.5/29.3 -> 10.8/32.1 -> 9.6/28.9 -> 9.3/28.1 -> 9.8/29.8 -Verbally consented for blood transfusion if indicated -Follow further GI recommendation and given that she had a syncopal episode with 600 mL of bright red blood yesterday GI recommended attempting to get her transferred to [facility] for double-balloon enteroscopy however the GI physicians at [facility] declined to take the transfer stating extreme limited availability for the double-balloon enteroscopy. -GI was recommending proceeding with a Meckel scan this morning and continue aggressive supportive measures as well as transfusing as indicated however she decompensated further and had a vasovagal episode  and had more hematemesis and rectal bleeding with clots.  GI canceled the Meckel scan and opted for a stat nuclear medicine bleeding scan and she was typed and screened and transfused 2 units PRBCs initiated on octreotide as well as given a dose of DDAVP and a 500 mL bolus; GI recommending continuing to check H&H as posttransfusion and every 6 for 24 hours and have initiated octreotide at 50 mcg/h infusion after a 50 mcg bolus.  They are recommending continuing estrogen and progesterone -The tagged RBC scan was delayed due to her being hemodynamically stable in the setting of recurrent large-volume bleeding with large clots from her rectum.  After she is stabilized she was taken for the tagged RBC and the GI team discussed with IR who reviewed the case and if her tagged RBC scan is positive she will need a stat CTA to x-ray target the area of bleeding required for fluoroscopic angiography -Further care pending nuclear medicine's and bleeding scan results per GI and she may get a repeat enteroscopy but her nuclear medicine bleeding scan was negative and repeat enteroscopy was referred -Given her tenuous blood pressures yesterday PCCM was consulted and they placed an arterial line and recommended a PICC line and the critical care physician is recommending a balanced transfusion strategy avoiding hypothermia, acidemia coagulopathy and recommending using Levophed as  needed -Patient seems to be stabilized and now going for Meckel scan today -DIC panel done and showed a D-dimer of 2.25, fibrinogen of 478, PT of 15.3 INR 1.2 and a PTT of 27 with no schistocytes seen on smear -Given her bleeding General surgery was consulted on standby but currently does not need any emergent surgery and is not requiring pressors and currently hemodynamically stable   Paroxysmal A-fib -In A-fib but rate controlled.  Not on anticoagulation due to GI bleed and low CHADS2 score. -Cardizem 30 mg every 8 hours as needed for  now -Resume home Cardizem CD once stable from bleeding standpoint but continue to hold given that she is hypotensive -Optimize electrolytes and continue monitor in the stepdown unit   Recurrent Vasovagal syncope -Patient had an episode of syncope while sitting on toilet previously and again had another Syncopal episode on the evening of 08/05/27 No fall or trauma. -She had another Syncopal episode 08/03/2022 she had another large bloody bowel movement -Continue SDU telemetry Monitoring -The repeat syncopal episode on 11/28 happened after she had a large bloody bowel movement approximately 600 mL of cranberry liquid stool.  Blood pressure at that time was 78/42 and she had to be placed in Trendelenburg.  She had a stat CTA of the abdomen and was transferred to the stepdown unit given 500 mL bolus -CTA done and showed "VASCULAR   No definite evidence of active gastrointestinal bleeding.   Atherosclerosis of abdominal aorta is noted without aneurysm or dissection. Patent right aortofemoral bypass graft is noted.   NON-VASCULAR   Low density is seen involving the inferior pole of the left kidney concerning for renal infarction or possibly pyelonephritis. This is new since prior exam." -She is given another 500 mL bolus on 08/03/2022 and transfused 2 units of PRBCs and taken for a stat nuclear medicine bleeding scan after an arterial line and a PICC line was placed.  Nuclear medicine bleeding scan was negative and she is undergoing a Meckel scan today   Osteoarthritis/spinal stenosis/low back pain -Stable -C/w Tylenol as needed -Continue home tramadol   Hypoalbuminemia -The patient's albumin level is now gone from 2.6 is now 2.3 on last check -Continue to Monitor and Trend and Repeat CMP in the AM    Obesity -Complicates overall prognosis and care -Estimated body mass index is 35.73 kg/m as calculated from the following:   Height as of this encounter: '5\' 2"'$  (1.575 m).   Weight as of  this encounter: 88.6 kg.  -Weight Loss and Dietary Counseling given  DVT prophylaxis: Place TED hose Start: 07/29/2022 0837 SCDs Start: 07/26/2022 1331    Code Status: Full Code Family Communication: Discussed with family at bedside  Disposition Plan:  Level of care: Stepdown Status is: Inpatient Remains inpatient appropriate because: Needs further clinical improvement and clearance by specialist and resolution of her GI bleeding   Consultants:  Gastroenterology PCCM critical care General Surgery  Interventional radiology is aware  Procedures:  COLONOSCOPY Findings:      A few medium-mouthed diverticula were found in the sigmoid colon and       ascending colon. Melanotic stool throughout the colon limiting the exam       despite aggressive lavage.      Some melanotic aspirate were noted in the TI as well which could easily       be washed. The terminal ileum appeared normal.      The exam was otherwise without abnormality on direct and retroflexion  views. Impression:               - Likely small bowel source of bleeding. Beyond the                            reach of pediatric colonoscope. No active bleeding.                           - Preparation of the colon was inadequate d/t                            melanotic stool throughout the colon.                           - Diverticulosis in the sigmoid colon and in the                            ascending colon.                           - The examined portion of the ileum was normal.                           - The examination was otherwise normal on direct                            and retroflexion views.                           - No specimens collected. Moderate Sedation:      none      Not Applicable - Patient had care per Anesthesia. Recommendation:           - Return patient to hospital ward for ongoing care.                           - Clear liquid diet.                           - Continue present  medications.                           - Trend Hb/Hct. Transfuse to keep Hb>7                           - If any further active bleeding, CTA followed by                            IR angiography. Will discuss with IR if there is                            any utility to proceed with mesenteric angiography                            even with negative CTA.                           -  If continued problems, single balloon enteroscopy                            vs double-balloon enteroscopy at [facility].                           - She would likely need LAR octreotide near                            discharge.                           - The findings and recommendations were discussed                            with the patient's family.  FURTHER PROCEDURES AS ABOVE   Antimicrobials:  Anti-infectives (From admission, onward)    None       Subjective: Seen and examined at bedside this morning and she states that she is doing better today and had no syncopal episodes.  Passed some bloody stools this morning but thinks she is doing okay on octreotide.  Plan is for a Meckel scan later this afternoon.  Denies any lightheadedness or dizziness.  No other concerns or complaints at this time.  Objective: Vitals:   08/07/22 0900 08/07/22 0915 08/07/22 0930 08/07/22 0945  BP:      Pulse: 97 91 97 94  Resp: 16 (!) '23 14 19  '$ Temp:      TempSrc:      SpO2: 97% 96% 98% 97%  Weight:      Height:        Intake/Output Summary (Last 24 hours) at 08/07/2022 1424 Last data filed at 08/07/2022 0900 Gross per 24 hour  Intake 604.38 ml  Output 550 ml  Net 54.38 ml   Filed Weights   08/05/2022 1104 07/21/2022 1124 07/14/2022 1401  Weight: 88.6 kg 88.6 kg 88.6 kg   Examination: Physical Exam:  Constitutional: WN/WD obese Caucasian female currently in no acute distress appears improved from yesterday Respiratory: Diminished to auscultation bilaterally, no wheezing, rales, rhonchi or crackles. Normal  respiratory effort and patient is not tachypenic. No accessory muscle use.  Unlabored breathing Cardiovascular: RRR, and S1 and S2 was auscultated.  Has mild lower extremity edema.   Abdomen: Soft, non-tender, distended secondary to body habitus. Bowel sounds positive.  GU: Deferred. Musculoskeletal: No clubbing / cyanosis of digits/nails. Normal strength and muscle tone.  Skin: No rashes, lesions, ulcers on limited skin evaluation. No induration; Warm and dry.  Neurologic: CN 2-12 grossly intact with no focal deficits. Romberg sign and cerebellar reflexes not assessed.  Psychiatric: Normal judgment and insight. Alert and oriented x 3. Normal mood and appropriate affect.   Data Reviewed: I have personally reviewed following labs and imaging studies  CBC: Recent Labs  Lab 08/05/2022 0845 07/30/2022 1341 08/03/22 0108 08/03/22 0828 07/15/2022 2114 08/05/22 0721 08/05/22 1102 07/19/2022 0251 07/26/2022 0737 07/23/2022 1542 08/05/2022 1543 07/31/2022 2126 08/07/22 0300 08/07/22 0900  WBC 13.5*  --  8.1  --  13.8* 7.6  --  8.1  --   --   --   --  8.1  --   NEUTROABS 10.8*  --   --   --   --   --   --  4.8  --   --   --   --   --   --   HGB 10.9*   < > 9.0*  9.1*   < > 7.1* 9.4*   < > 8.5* 9.5*  --  10.8* 9.6* 9.4*  9.3* 9.8*  HCT 33.7*   < > 28.1*  28.7*   < > 22.7* 29.2*   < > 26.0* 29.3*  --  32.1* 28.9* 29.3*  28.1* 29.8*  MCV 97.7  --  97.6  --  100.4* 96.1  --  97.0  --   --   --   --  94.5  --   PLT 334  --  312  --  339 307  --  294  --  309  --   --  344  --    < > = values in this interval not displayed.   Basic Metabolic Panel: Recent Labs  Lab 07/11/2022 0845 08/03/22 0108 08/05/22 0721 08/05/2022 0521 08/07/22 0300  NA 142 141 141 138 137  K 4.0 3.6 3.7 3.6 4.2  CL 106 106 111 109 107  CO2 '26 27 23 22 25  '$ GLUCOSE 104* 86 88 84 123*  BUN '10 9 11 8 11  '$ CREATININE 0.72 0.79 0.66 0.58 0.59  CALCIUM 8.9 8.5* 7.7* 8.1* 7.9*  MG  --   --  1.8 1.7  --   PHOS  --   --  3.2 3.0  --     GFR: Estimated Creatinine Clearance: 75.4 mL/min (by C-G formula based on SCr of 0.59 mg/dL). Liver Function Tests: Recent Labs  Lab 07/18/2022 0845 08/03/22 0108 08/05/22 0721 08/05/22 1102 07/13/2022 0521  AST 22 14*  --  12* 11*  ALT 18 14  --  13 10  ALKPHOS 41 37*  --  41 37*  BILITOT 1.0 0.6  --  0.8 0.6  PROT 6.6 5.6*  --  4.9* 4.6*  ALBUMIN 3.3* 2.9* 2.6* 2.6* 2.3*   No results for input(s): "LIPASE", "AMYLASE" in the last 168 hours. No results for input(s): "AMMONIA" in the last 168 hours. Coagulation Profile: Recent Labs  Lab 07/24/2022 0845 07/15/2022 1542  INR 1.1 1.2   Cardiac Enzymes: No results for input(s): "CKTOTAL", "CKMB", "CKMBINDEX", "TROPONINI" in the last 168 hours. BNP (last 3 results) Recent Labs    03/24/22 1110  PROBNP 1,025*   HbA1C: No results for input(s): "HGBA1C" in the last 72 hours. CBG: Recent Labs  Lab 08/03/22 2101 07/23/2022 1816  GLUCAP 124* 107*   Lipid Profile: No results for input(s): "CHOL", "HDL", "LDLCALC", "TRIG", "CHOLHDL", "LDLDIRECT" in the last 72 hours. Thyroid Function Tests: No results for input(s): "TSH", "T4TOTAL", "FREET4", "T3FREE", "THYROIDAB" in the last 72 hours. Anemia Panel: No results for input(s): "VITAMINB12", "FOLATE", "FERRITIN", "TIBC", "IRON", "RETICCTPCT" in the last 72 hours. Sepsis Labs: Recent Labs  Lab 08/01/2022 0845  LATICACIDVEN 1.9    No results found for this or any previous visit (from the past 240 hour(s)).   Radiology Studies: NM Bowel Img Meckels  Result Date: 08/07/2022 CLINICAL DATA:  Rectal bleeding. Negative tagged red blood cell scan 1 day prior. EXAM: NUCLEAR MEDICINE BOWEL (MECKEL'S) SCAN TECHNIQUE: Sequential abdominal images were obtained following intravenous injection of radiopharmaceutical. RADIOPHARMACEUTICALS:  10.4 mCi Tc-87mpertechnetate IV COMPARISON:  None Available. FINDINGS: Prompt physiologic binding of radiotracer within the stomach gastric mucosa. No evidence  of ectopic gastric mucosa in the abdomen pelvis over 1 hour imaging. Urine activity noted  the bladder. IMPRESSION: No ectopic gastric mucosa identified to suggest Meckel's diverticulum. Negative Meckel's scan Electronically Signed   By: Suzy Bouchard M.D.   On: 08/07/2022 14:08   NM GI Blood Loss  Result Date: 07/30/2022 CLINICAL DATA:  Bright red blood per rectum. Evaluate for active gastrointestinal bleeding. EXAM: NUCLEAR MEDICINE GASTROINTESTINAL BLEEDING SCAN TECHNIQUE: Sequential abdominal images were obtained following intravenous administration of Tc-27mlabeled red blood cells. RADIOPHARMACEUTICALS:  20.3 mCi Tc-957mertechnetate in-vitro labeled red cells. COMPARISON:  CT angiogram 08/05/2022 FINDINGS: 2 hours of planar imaging were performed. No accumulation of tagged red blood cells within the small bowel or colon. Physiologic activity noted within the blood pool and solid organs. IMPRESSION: No evidence of active gastrointestinal bleeding over 2 hours imaging. Electronically Signed   By: StSuzy Bouchard.D.   On: 08/03/2022 14:38   DG CHEST PORT 1 VIEW  Result Date: 07/24/2022 CLINICAL DATA:  [id number] Encounter for central line placement [id number]: PORTABLE CHEST 1 VIEW COMPARISON:  07/25/2022 chest radiograph. FINDINGS: Right PICC terminates over the cavoatrial junction. Partially visualized surgical hardware from ACDF. Spinal cerclage wire overlies the upper thoracic spine. Stable cardiomediastinal silhouette with top-normal heart size. No pneumothorax. No pleural effusion. Lungs appear clear, with no acute consolidative airspace disease and no pulmonary edema. IMPRESSION: Right PICC terminates over the cavoatrial junction. No pneumothorax. No active cardiopulmonary disease. Electronically Signed   By: JaIlona Sorrel.D.   On: 07/25/2022 11:49   [place] SITE RITE  Result Date: 07/23/2022 If Site Rite image not attached, placement could not be confirmed due to current cardiac rhythm.    Scheduled Meds:  sodium chloride   Intravenous Once   Chlorhexidine Gluconate Cloth  6 each Topical Daily   estrogens-methylTEST  1 tablet Oral Daily   medroxyPROGESTERone  5 mg Oral Daily   melatonin  10 mg Oral QHS   pantoprazole (PROTONIX) IV  40 mg Intravenous Q12H   sodium chloride flush  10-40 mL Intracatheter Q12H   Continuous Infusions:  octreotide (SANDOSTATIN) 500 mcg in sodium chloride 0.9 % 250 mL (2 mcg/mL) infusion 50 mcg/hr (08/07/22 0900)    LOS: 5 days   OmRaiford NobleDO [facility] Available via Epic secure chat 7am-7pm After these hours, please refer to coverage provider listed on [email or web address] 08/07/2022, 2:24 PM

## 2022-08-07 NOTE — Progress Notes (Signed)
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  Please see GI procedure notes over the past couple months as she has had multiple EGDs, colonoscopy, and small bowel enteroscopy.  She has been here couple days getting worked up for this recurrence when overnight she began having more brisk bleeding with associated hypotension.  PCCM consulted to help manage.  Currently denies dizziness, abdominal pain.  Has had a couple episodes of hematemesis overnight.  Ongoing bright red blood from rectum during evaluation.  Pertinent  Medical History  reflux Mitral valve prolapse Osteoarthritis  Significant Hospital Events: Including procedures, antibiotic start and stop dates in addition to other pertinent events   11/26 admit, CTA negative 11/26 small bowel enteroscopy no active bleeding seen, multiple sites of prior hemoclips in small bowel 11/28 colonoscopy no active bleeding, melena in colon, medium diverticuli  11/30 PCCM consult. Patient with continued bleeding with hematemesis and large red blood per rectum with abd pain   Interim History / Subjective:  Required 2 units RBC and IV fluids for continued hypotension and active bleeding with droop in hemoglobin. Underwent NM scan, negative for active bleed. EGS consulted. Plan for Meckels scan today. Patient report bright red BM this AM and cramping to mid ABD overnight   Objective   Blood pressure 135/62, pulse 92, temperature 98.3 F (36.8 C), temperature source Oral, resp. rate 15, height '5\' 2"'$  (1.575 m), weight 88.6 kg, SpO2 97 %.        Intake/Output Summary (Last 24 hours) at 08/07/2022 0755 Last data filed at 08/07/2022  0400 Gross per 24 hour  Intake 2044.3 ml  Output 250 ml  Net 1794.3 ml   Filed Weights   07/16/2022 1104 07/16/2022 1124 07/25/2022 1401  Weight: 88.6 kg 88.6 kg 88.6 kg    Examination: General: Adult female, lying in bed, no distress  HENT: Mucous membranes are moist, trachea midline Lungs: Clear breath sounds, no use of accessory muscles  Cardiovascular: HR 90, RRR, no mRG  Abdomen: Soft, active bowel sounds, generalized mid ABD tenderness  Extremities: +1 pedal edema, right upper with slight redness and swelling  Neuro: alert, oriented, follows commands    Resolved Hospital Problem list   Not applicable  Assessment & Plan:   Shock-recurrent vagal events versus hemorrhagic Recurrent GI bleed with history of small bowel AVMs - Small bowel enteroscopy 11/21 > bleeding AVM at the duodenum with erosion and a single distal duodenal AVM  - CTA 11/26 negative for active bleed - Bowel enteroscopy 11/26 erosions in the 3rd and 4th portions of the  duodenum corresponding w/ prior APC sites without active bleeding  - Colonoscopy 11/28 howed no active bleeding found in the colon, there was melena in the colon which was lavaged, some melanotic aspirate also noted in terminal ileum and a few medium sized diverticuli noted in the sigmoid and ascending colon  - NM 11/30 negative for active bleed  Plan - Not requiring vasopressors at this time. MAP goal >65  - GI/EGS following > NPO for plan for Meckels study today  - Trend CBC q6h, transfuse for hemoglobin <7 or with hypotension/active bleeding  - Continue PPI/octreotide - Balanced transfusion  strategy, avoid hypothermia, acidemia, coagulopathy  Best Practice (right click and "Reselect all SmartList Selections" daily)   Diet/type: NPO DVT prophylaxis: SCD GI prophylaxis: PPI Lines: PICC Foley:  N/A Code Status:  full code Last date of multidisciplinary goals of care discussion [pending]  Labs   CBC: Recent Labs  Lab 07/24/2022 0845  07/18/2022 1341 08/03/22 0108 08/03/22 0828 07/29/2022 2114 08/05/22 0721 08/05/22 1102 08/01/2022 0251 07/24/2022 0737 08/03/2022 1542 07/13/2022 1543 07/22/2022 2126 08/07/22 0300  WBC 13.5*  --  8.1  --  13.8* 7.6  --  8.1  --   --   --   --   --   NEUTROABS 10.8*  --   --   --   --   --   --  4.8  --   --   --   --   --   HGB 10.9*   < > 9.0*  9.1*   < > 7.1* 9.4*   < > 8.5* 9.5*  --  10.8* 9.6* 9.3*  HCT 33.7*   < > 28.1*  28.7*   < > 22.7* 29.2*   < > 26.0* 29.3*  --  32.1* 28.9* 28.1*  MCV 97.7  --  97.6  --  100.4* 96.1  --  97.0  --   --   --   --   --   PLT 334  --  312  --  339 307  --  294  --  309  --   --   --    < > = values in this interval not displayed.    Basic Metabolic Panel: Recent Labs  Lab 07/29/2022 0845 08/03/22 0108 08/05/22 0721 07/11/2022 0521 08/07/22 0300  NA 142 141 141 138 137  K 4.0 3.6 3.7 3.6 4.2  CL 106 106 111 109 107  CO2 '26 27 23 22 25  '$ GLUCOSE 104* 86 88 84 123*  BUN '10 9 11 8 11  '$ CREATININE 0.72 0.79 0.66 0.58 0.59  CALCIUM 8.9 8.5* 7.7* 8.1* 7.9*  MG  --   --  1.8 1.7  --   PHOS  --   --  3.2 3.0  --    GFR: Estimated Creatinine Clearance: 75.4 mL/min (by C-G formula based on SCr of 0.59 mg/dL). Recent Labs  Lab 07/23/2022 0845 08/03/22 0108 07/12/2022 2114 08/05/22 0721 07/20/2022 0251  WBC 13.5* 8.1 13.8* 7.6 8.1  LATICACIDVEN 1.9  --   --   --   --     Liver Function Tests: Recent Labs  Lab 07/22/2022 0845 08/03/22 0108 08/05/22 0721 08/05/22 1102 07/08/2022 0521  AST 22 14*  --  12* 11*  ALT 18 14  --  13 10  ALKPHOS 41 37*  --  41 37*  BILITOT 1.0 0.6  --  0.8 0.6  PROT 6.6 5.6*  --  4.9* 4.6*  ALBUMIN 3.3* 2.9* 2.6* 2.6* 2.3*   No results for input(s): "LIPASE", "AMYLASE" in the last 168 hours. No results for input(s): "AMMONIA" in the last 168 hours.  ABG No results found for: "PHART", "PCO2ART", "PO2ART", "HCO3", "TCO2", "ACIDBASEDEF", "O2SAT"   Coagulation Profile: Recent Labs  Lab 07/21/2022 0845 07/27/2022 1542   INR 1.1 1.2    Cardiac Enzymes: No results for input(s): "CKTOTAL", "CKMB", "CKMBINDEX", "TROPONINI" in the last 168 hours.  HbA1C: No results found for: "HGBA1C"  CBG: Recent Labs  Lab 08/03/22 2101 07/24/2022 1816  GLUCAP 124* 107*    Review of  Systems:   +Bright red blood in stool, cramping to ABD   Past Medical History:  She,  has a past medical history of Arthritis, Cancer ([place]), DDD (degenerative disc disease), lumbar, DDD (degenerative disc disease), lumbosacral, GERD (gastroesophageal reflux disease), History of avascular necrosis of capital femoral epiphysis, History of mitral valve prolapse, History of vulvar dysplasia, Iron deficiency anemia due to chronic blood loss (05/05/2022), Lumbar spondylosis, OA (osteoarthritis), PMB (postmenopausal bleeding), Scoliosis, and Wears glasses.   Surgical History:   Past Surgical History:  Procedure Laterality Date   ANTERIOR CERVICAL DECOMP/DISCECTOMY FUSION  11-25-2009  '@[facility]'$    C4 --- C7   ANTERIOR LAT LUMBAR FUSION Left 10/22/2016   Procedure: Anterior Lateral Lumbar Fusion - Lumbar two - lumbar three, with Lateral plate;  Surgeon: Kary Kos, MD;  Location: [facility];  Service: Neurosurgery;  Laterality: Left;  Anterior Lateral Lumbar Fusion - Lumbar two - lumbar three, with Lateral plate   CARDIAC CATHETERIZATION  age 15   CO2 LASER APPLICATION N/A 29/56/2130   Procedure: CO2 LASER APPLICATION;  Surgeon: Gus Height, MD;  Location: [facility];  Service: Gynecology;  Laterality: N/A;   COLONOSCOPY W/ POLYPECTOMY  06/21/2017   Dr.Perry   ENTEROSCOPY N/A 07/28/2022   Procedure: ENTEROSCOPY;  Surgeon: Rush Landmark Telford Nab., MD;  Location: [facility];  Service: Gastroenterology;  Laterality: N/A;   ENTEROSCOPY N/A 07/26/2022   Procedure: ENTEROSCOPY;  Surgeon: Rush Landmark Telford Nab., MD;  Location: [facility];  Service: Gastroenterology;  Laterality: N/A;   GIVENS CAPSULE STUDY N/A 07/26/2022   Procedure: GIVENS CAPSULE STUDY;   Surgeon: Irving Copas., MD;  Location: [facility];  Service: Gastroenterology;  Laterality: N/A;   HEMOSTASIS CLIP PLACEMENT  07/28/2022   Procedure: HEMOSTASIS CLIP PLACEMENT;  Surgeon: Irving Copas., MD;  Location: [facility];  Service: Gastroenterology;;   HOT HEMOSTASIS N/A 07/28/2022   Procedure: HOT HEMOSTASIS (ARGON PLASMA COAGULATION/BICAP);  Surgeon: Irving Copas., MD;  Location: [facility];  Service: Gastroenterology;  Laterality: N/A;   HYSTEROSCOPY WITH D & C N/A 08/27/2020   Procedure: DILATATION AND CURETTAGE /HYSTEROSCOPY;  Surgeon: Vanessa Kick, MD;  Location: [facility];  Service: Gynecology;  Laterality: N/A;   LAPAROSCOPIC CHOLECYSTECTOMY  04/2006   LEG SURGERY Right 1983   femoral artery repair from cardiac cath injury    OPEN REDUCTION INTERNAL FIXATION (ORIF) METACARPAL Right 08/12/2017   Procedure: OPEN REDUCTION INTERNAL FIXATION (ORIF) 5th METACARPAL fracture;  Surgeon: Roseanne Kaufman, MD;  Location: [facility];  Service: Orthopedics;  Laterality: Right;  60 mins   SPINAL FIXATION SURGERY  1973   age 34 - sclosis   SUBMUCOSAL TATTOO INJECTION  07/28/2022   Procedure: SUBMUCOSAL TATTOO INJECTION;  Surgeon: Irving Copas., MD;  Location: [facility];  Service: Gastroenterology;;   TOTAL HIP ARTHROPLASTY Right 06-16-2010 '@[facility]'$    UPPER GASTROINTESTINAL ENDOSCOPY     VSD REPAIR  age 40   VULVECTOMY N/A 09/18/2015   Procedure: WIDE EXCISION VULVECTOMY;  Surgeon: Vanessa Kick, MD;  Location: [facility];  Service: Gynecology;  Laterality: N/A;     Social History:   reports that she quit smoking about 19 years ago. Her smoking use included cigarettes. She has a 20.00 pack-year smoking history. She has never used smokeless tobacco. She reports current alcohol use. She reports that she does not use drugs.   Family History:  Her family history includes Alcoholism in her father; COPD in her mother; Diabetes in her father and  another family member; Heart disease in an  other family member; Pneumonia in her father. There is no history of Colon cancer, Esophageal cancer, or Stomach cancer.   Allergies Allergies  Allergen Reactions   Tape Itching and Other (See Comments)    Redness      Home Medications  Prior to Admission medications   Medication Sig Start Date End Date Taking? Authorizing Provider  acetaminophen (TYLENOL) 650 MG CR tablet Take 1,300 mg by mouth 2 (two) times daily as needed for pain.   Yes [provider]  Collagen-Vitamin C-Biotin (COLLAGEN 1500/C PO) Take 1 capsule by mouth daily.   Yes [provider]  diltiazem (CARDIZEM CD) 180 MG 24 hr capsule Take 1 capsule (180 mg total) by mouth daily. 09/09/21  Yes Imogene Burn, PA-C  estrogens-methylTEST 1.25-2.5 MG TABS tablet Take 1 tablet by mouth daily.   Yes [provider]  ferrous sulfate 325 (65 FE) MG EC tablet Take 1 tablet (325 mg total) by mouth 2 (two) times daily. Patient taking differently: Take 325 mg by mouth every other day. 05/04/22  Yes [name], MD  medroxyPROGESTERone (PROVERA) 5 MG tablet Take 5 mg by mouth daily. 10/21/20  Yes [provider]  OVER THE COUNTER MEDICATION Take 2 capsules by mouth daily. Zinc, Vitamin C, and Vitamin D   Yes [provider]  pantoprazole (PROTONIX) 40 MG tablet Take 1 tablet (40 mg total) by mouth 2 (two) times daily. 07/29/22  Yes Danford, Suann Larry, MD  sucralfate (CARAFATE) 1 g tablet Take 1 tablet (1 g total) by mouth 2 (two) times daily for 14 days. 07/29/22 08/12/22 Yes Danford, Suann Larry, MD  traMADol (ULTRAM) 50 MG tablet Take 100 mg by mouth 2 (two) times daily.   Yes [provider]    Time Spent: 45 minutes   Hayden Pedro, AGACNP-BC [place]  PCCM Pgr: [phone]

## 2022-08-07 NOTE — Plan of Care (Signed)

## 2022-08-07 NOTE — Progress Notes (Signed)
3 Days Post-Op  Subjective: CC: No hematemesis or bloody bm's overnight. Around 730 am had some crampy abdominal pain and dark maroon blots pass per rectum. No further blood bm's. No current abdominal pain.   No currently on pressures. Last HR 94 and BP 135/62 on vitals. Hgb stable at 9.8 Discussed with GI in person.   Objective: Vital signs in last 24 hours: Temp:  [98.1 F (36.7 C)-98.8 F (37.1 C)] 98.1 F (36.7 C) (12/01 0815) Pulse Rate:  [83-135] 94 (12/01 0945) Resp:  [11-26] 19 (12/01 0945) BP: (92-135)/(52-72) 135/62 (12/01 0415) SpO2:  [96 %-100 %] 97 % (12/01 0945) Arterial Line BP: (75-142)/(45-108) 101/95 (12/01 0945) Last BM Date : 07/30/2022  Intake/Output from previous day: 11/30 0701 - 12/01 0700 In: 2044.3 [I.V.:520; Blood:922.7; IV Piggyback:601.6] Out: 250 [Urine:250] Intake/Output this shift: Total I/O In: 124.9 [I.V.:124.9] Out: 550 [Urine:300; Stool:250]  PE: Gen:  Alert, NAD, pleasant Abd: Soft, ND, NT, +BS, Psych: A&Ox3    Lab Results:  Recent Labs    08/05/2022 0251 07/22/2022 0737 07/18/2022 1542 07/17/2022 1543 08/07/22 0300 08/07/22 0900  WBC 8.1  --   --   --  8.1  --   HGB 8.5*   < >  --    < > 9.4*  9.3* 9.8*  HCT 26.0*   < >  --    < > 29.3*  28.1* 29.8*  PLT 294  --  309  --  344  --    < > = values in this interval not displayed.   BMET Recent Labs    08/01/2022 0521 08/07/22 0300  NA 138 137  K 3.6 4.2  CL 109 107  CO2 22 25  GLUCOSE 84 123*  BUN 8 11  CREATININE 0.58 0.59  CALCIUM 8.1* 7.9*   PT/INR Recent Labs    07/25/2022 1542  LABPROT 15.3*  INR 1.2   CMP     Component Value Date/Time   NA 137 08/07/2022 0300   NA 139 03/24/2022 1110   K 4.2 08/07/2022 0300   CL 107 08/07/2022 0300   CO2 25 08/07/2022 0300   GLUCOSE 123 (H) 08/07/2022 0300   BUN 11 08/07/2022 0300   BUN 19 03/24/2022 1110   CREATININE 0.59 08/07/2022 0300   CALCIUM 7.9 (L) 08/07/2022 0300   PROT 4.6 (L) 07/15/2022 0521   PROT 6.0  03/24/2022 1110   ALBUMIN 2.3 (L) 07/19/2022 0521   ALBUMIN 4.0 03/24/2022 1110   AST 11 (L) 07/12/2022 0521   ALT 10 07/23/2022 0521   ALKPHOS 37 (L) 07/18/2022 0521   BILITOT 0.6 07/10/2022 0521   BILITOT 0.2 03/24/2022 1110   GFRNONAA >60 08/07/2022 0300   GFRAA >60 05/29/2020 1745   Lipase  No results found for: "LIPASE"  Studies/Results: NM GI Blood Loss  Result Date: 07/31/2022 CLINICAL DATA:  Bright red blood per rectum. Evaluate for active gastrointestinal bleeding. EXAM: NUCLEAR MEDICINE GASTROINTESTINAL BLEEDING SCAN TECHNIQUE: Sequential abdominal images were obtained following intravenous administration of Tc-49mlabeled red blood cells. RADIOPHARMACEUTICALS:  20.3 mCi Tc-955mertechnetate in-vitro labeled red cells. COMPARISON:  CT angiogram 07/28/2022 FINDINGS: 2 hours of planar imaging were performed. No accumulation of tagged red blood cells within the small bowel or colon. Physiologic activity noted within the blood pool and solid organs. IMPRESSION: No evidence of active gastrointestinal bleeding over 2 hours imaging. Electronically Signed   By: StSuzy Bouchard.D.   On: 07/22/2022 14:38   DG CHEST PORT  1 VIEW  Result Date: 08/03/2022 CLINICAL DATA:  [id number] Encounter for central line placement [id number] EXAM: PORTABLE CHEST 1 VIEW COMPARISON:  07/11/2022 chest radiograph. FINDINGS: Right PICC terminates over the cavoatrial junction. Partially visualized surgical hardware from ACDF. Spinal cerclage wire overlies the upper thoracic spine. Stable cardiomediastinal silhouette with top-normal heart size. No pneumothorax. No pleural effusion. Lungs appear clear, with no acute consolidative airspace disease and no pulmonary edema. IMPRESSION: Right PICC terminates over the cavoatrial junction. No pneumothorax. No active cardiopulmonary disease. Electronically Signed   By: Ilona Sorrel M.D.   On: 07/29/2022 11:49   [place] EKG SITE RITE  Result Date: 07/23/2022 If Site Rite image not  attached, placement could not be confirmed due to current cardiac rhythm.   Anti-infectives: Anti-infectives (From admission, onward)    None        Assessment/Plan GI bleeding of unknown source Known hx of small bowel AVMs - S/P small bowel enteroscopy 07/28/2022 identified a bleeding AVM in the duodenum with erosion as well as a separate single distal duodenal AVM both of which were ablated. She also had a clip placed in the area of a possible subepithelial lesion versus angulation. - Repeat small bowel enteroscopy showed erosions in the 3rd and 4th portions of the duodenum at prior APC sites without active bleeding.  - Colonoscopy 11/28 without active source of bleeding  - GI following. Given DDAVP 11/30. She is on octreotide and PPI currently. - Tagged RBC scan negative 11/30, CTA negative on 11/26 and 11/28 - Per notes GI plans repeat CTA if she has any further bloody bm's and have IR on standby for possible fluoroscopic angiography if indicated. She is scheduled for a Meckle's scan today.  - GI suspects the AVM in distal small bowel seen on capsule endoscopy is the source of bleeding and is to distal to reach with push enteroscopy. They have spoken with [facility] for retrograde enteroscopy and double balloon enteroscopy or retrograde enteroscopy but were declined. I am unable to see these picture from capsule endoscopy. I asked if they would be able to locate report/results. Intraluminal bleeding would be very difficult to identify intra-op so would ask GI to see if they can narrow down exactly where this is located so if she does require an emergent resection we have a better chance at localizing this area. For now I do not think she needs any emergent surgery.  Her hgb is overall stable, she is not requiring pressors and is currently HDS . Agree with GI's current plans as noted above. We will continue to follow along.  ABL anemia - secondary to above, s/p 2 units 11/28 and 11/30. Hgb  stable at 9.8 this am (trend 10.8 > 9.6 > 9.3 > 9.8)   FEN - Per GI VTE - SCDs, on hold ID - None   Atrial fibrillation not on anticoagulation  GERD Hx of MVP Scoliosis/lumbar spondylosis Osteoarthritis    LOS: 5 days    Jillyn Ledger , [facility] 08/07/2022, 10:06 AM Please see Amion for pager number during day hours 7:00am-4:30pm

## 2022-08-07 NOTE — Progress Notes (Addendum)
[facility] Progress Note  CC:  GI bleed, anemia    Subjective: No hematemesis or blood per the rectum overnight. She passed approximately 335m of cranberry red blood per the rectum at 7:30am this morning with mild upper abdominal cramping. No further syncope. BP stable. She appears less lethargic today and she is more conversant. She stated she feels better today. No CP or SOB. She tolerated a clear liquid diet yesterday, she did not want to try a full liquid diet. She is NPO at this time as required for the Meckels scan scheduled at 1pm today.   Objective:  Vital signs in last 24 hours: Temp:  [97.9 F (36.6 C)-98.8 F (37.1 C)] 98.3 F (36.8 C) (12/01 0300) Pulse Rate:  [80-135] 92 (12/01 0415) Resp:  [10-28] 15 (12/01 0415) BP: (51-135)/(27-76) 135/62 (12/01 0415) SpO2:  [91 %-100 %] 97 % (12/01 0415) Arterial Line BP: (75-142)/(45-99) 79/53 (12/01 0415) Last BM Date : 08/05/2022 General:  Alert 61year old female in NAD. She is less pale today, has pink color in cheeks.  Heart: Mild tachycardia. No murmur.  Pulm: Breath sounds clear throughout.  Abdomen: Soft, nontender. Nondistended. No bruit. Positive bowel sounds x 4 quads.  Extremities:  Without edema. Neurologic:  Alert and  oriented x 4. Grossly normal neurologically. Psych:  Alert and cooperative. Normal mood and affect.  Intake/Output from previous day: 11/30 0701 - 12/01 0700 In: 2044.3 [I.V.:520; Blood:922.7; IV Piggyback:601.6] Out: 250 [Urine:250] Intake/Output this shift: No intake/output data recorded.  Lab Results: Recent Labs    08/05/2022 2114 08/05/22 0721 08/05/22 1102 08/03/2022 0251 07/28/2022 0737 07/28/2022 1542 07/17/2022 1543 07/11/2022 2126 08/07/22 0300  WBC 13.8* 7.6  --  8.1  --   --   --   --   --   HGB 7.1* 9.4*   < > 8.5*   < >  --  10.8* 9.6* 9.3*  HCT 22.7* 29.2*   < > 26.0*   < >  --  32.1* 28.9* 28.1*  PLT 339 307  --  294  --  309  --   --   --    < > = values in this  interval not displayed.   BMET Recent Labs    08/05/22 0721 08/01/2022 0521 08/07/22 0300  NA 141 138 137  K 3.7 3.6 4.2  CL 111 109 107  CO2 '23 22 25  '$ GLUCOSE 88 84 123*  BUN '11 8 11  '$ CREATININE 0.66 0.58 0.59  CALCIUM 7.7* 8.1* 7.9*   LFT Recent Labs    08/05/22 1102 07/28/2022 0521  PROT 4.9* 4.6*  ALBUMIN 2.6* 2.3*  AST 12* 11*  ALT 13 10  ALKPHOS 41 37*  BILITOT 0.8 0.6  BILIDIR 0.2  --   IBILI 0.6  --    PT/INR Recent Labs    07/25/2022 1542  LABPROT 15.3*  INR 1.2   Hepatitis Panel No results for input(s): "HEPBSAG", "HCVAB", "HEPAIGM", "HEPBIGM" in the last 72 hours.  NM GI Blood Loss  Result Date: 07/14/2022 CLINICAL DATA:  Bright red blood per rectum. Evaluate for active gastrointestinal bleeding. EXAM: NUCLEAR MEDICINE GASTROINTESTINAL BLEEDING SCAN TECHNIQUE: Sequential abdominal images were obtained following intravenous administration of Tc-952mabeled red blood cells. RADIOPHARMACEUTICALS:  20.3 mCi Tc-9960mrtechnetate in-vitro labeled red cells. COMPARISON:  CT angiogram 07/09/2022 FINDINGS: 2 hours of planar imaging were performed. No accumulation of tagged red blood cells within the small bowel or colon. Physiologic activity noted within  the blood pool and solid organs. IMPRESSION: No evidence of active gastrointestinal bleeding over 2 hours imaging. Electronically Signed   By: Suzy Bouchard M.D.   On: 07/10/2022 14:38   DG CHEST PORT 1 VIEW  Result Date: 07/13/2022 CLINICAL DATA:  [id number] Encounter for central line placement [id number] EXAM: PORTABLE CHEST 1 VIEW COMPARISON:  07/31/2022 chest radiograph. FINDINGS: Right PICC terminates over the cavoatrial junction. Partially visualized surgical hardware from ACDF. Spinal cerclage wire overlies the upper thoracic spine. Stable cardiomediastinal silhouette with top-normal heart size. No pneumothorax. No pleural effusion. Lungs appear clear, with no acute consolidative airspace disease and no pulmonary edema.  IMPRESSION: Right PICC terminates over the cavoatrial junction. No pneumothorax. No active cardiopulmonary disease. Electronically Signed   By: Ilona Sorrel M.D.   On: 07/14/2022 11:49   [place] EKG SITE RITE  Result Date: 07/15/2022 If Site Rite image not attached, placement could not be confirmed due to current cardiac rhythm.   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 S/P small bowel enteroscopy 07/28/2022 identified a bleeding AVM in the duodenum with erosion as well as a separate single distal duodenal AVM both of which were ablated. She also had a clip placed in the area of a possible subepithelial lesion versus angulation.  Her hemoglobin remained stable and she was discharged home on Sucralfate and PPI.   She was readmitted to the hospital 07/13/2022 with recurrent GI bleed and anemia. CTA was negative. She underwent a second small bowel enteroscopy which identified a 5 cm hiatal hernia evidence of mild retch gastropathy, there were erosions in the third portion of the duodenum and fourth portion of the duodenum corresponding to the prior APC sites The previously placed hemoclip was found in the third portion of the duodenum with no evidence of active bleeding. Capsule was placed endoscopically and again no active bleeding noted in the proximal and mid small bowel, there was 1 small nonbleeding AVM noted and there was trace amount of heme at the prior hemoclip site but no active bleeding.  In the more distal small bowel there was gross melena which continued throughout into the visualized colon, no lesion visible   Colonoscopy 08/05/2022 showed no active bleeding found in the colon, there was melena in the colon which was lavaged, some melanotic aspirate also noted in terminal ileum and a few medium sized diverticuli noted in the sigmoid and ascending colon. She passed a large amount of  blood per the rectum post colonoscopy which resulted in hypotension, Hg dropped to 7.1 and she was transferred to the ICU. Transfused 2 units of PRBCs -> Hg 9.4. Stat CTA was negative for active GI bleeding. She had active hematemesis and multiple episodes of large volume red blood with clots per the rectum with several episodes of brief syncope, hypotension and tachycardia.  Hg 8.5 -> Hg 9.5. She received IV fluid bolus, DDVAP x 1 dose, transfused 2 units of PRBCs -> post transfusion Hg 10.8 -> 9.6 -> today Hg 9.3. I contacted IR and reviewed case with radiologist Dr. Michaelle Birks 11/30 who discussed if her tagged RBC scan is positive she will need a stat CTA to accurately target the area of bleeding required for fluoroscopic angiography. Tagged RBC scan 11/30 was negative. General surgery was consulted, no plans for surgical resection at this time as her GI bleeding is likely intraluminal and the exact bleeding target  has not been clearly defined. No hematemesis or hematochezia overnight, however, she passed approximately 387m of cranberry colored blood per the rectum this morning. She is hemodynamically stable at this time. -Stat abdominal/pelvic CTA if she develops brisk GI bleeding or if she has further blood per the rectum with hypotension/syncope -NPO for Meckels scan -Meckels scan 1pm today, per nuclear med Meckels scan had to be scheduled 24 hours post RBC scan to let the radioactivity decay out -Continue Octreotide infusion -Continue PPI IV bid -Continue Compazine '10mg'$  IV Q 6 hrs PRN -Continue Estrogen/Progesterone -Monitor H/H closely, transfuse for Hg < 8 or as needed if symptomatic. Keep 2 units of PRBCs on hold in blood bank.  [facility] declined transfer due to extremely limited availability for double balloon enteroscopy or retrograde enteroscopy. [facility] or [facility] do not do provide these procedures.  -Await  further recommendations per Dr. GLyndel Safe 2) Anemia, secondary to # 1. Transfused 2 units of PRBCs 11/28 -08/05/2022. Transfused 2 units of PRBCs on 08/03/2022.    3) GERD -Continue PPI IV bid   4) Atrial fibrillation, not on anticoagulation   Principal Problem:   Acute GI bleeding Active Problems:   Osteoarthritis   GERD (gastroesophageal reflux disease)   Spinal stenosis   Hematemesis with nausea   Paroxysmal atrial fibrillation (HCC)   Class II obesity   Acute blood loss anemia   Dark stools     LOS: 5 days   CNoralyn Pick 08/07/2022, 8:21AM   Attending physician's note   I have taken history, reviewed the chart and examined the patient. I performed a substantive portion of this encounter, including complete performance of at least one of the key components, in conjunction with the APP. I agree with the Advanced Practitioner's note, impression and recommendations.   Meckel's scan neg  She looks good today. Had 1 episode of BRB - jelly like without hypotension. No further bleeding. Hb stable  Plan: -Full liquid diet -Continue IV octreotide. May need LAR octreotide at D/C -Stat CTA if/when active bleeding (with delayed images for venous phase). IR to stand by, if +ve for embolization  -Appreciate IR consult/surgery consult to standby -If no further rebleed, advance diet in a.m. -[facility] -Dr HBenson Norwaytaking over the service tomorrow.   RCarmell Austria MD LVel[facility] HecklerGI [phone]

## 2022-08-07 NOTE — Progress Notes (Signed)
1200 To Nuc Med for Meckel scan

## 2022-08-07 DEATH — deceased

## 2022-08-08 ENCOUNTER — Inpatient Hospital Stay (HOSPITAL_COMMUNITY): Payer: PRIVATE HEALTH INSURANCE | Admitting: Anesthesiology

## 2022-08-08 ENCOUNTER — Inpatient Hospital Stay (HOSPITAL_COMMUNITY): Payer: PRIVATE HEALTH INSURANCE

## 2022-08-08 ENCOUNTER — Encounter (HOSPITAL_COMMUNITY): Admission: EM | Disposition: E | Payer: Self-pay | Source: Home / Self Care | Attending: Internal Medicine

## 2022-08-08 ENCOUNTER — Encounter (HOSPITAL_COMMUNITY): Payer: Self-pay | Admitting: Internal Medicine

## 2022-08-08 DIAGNOSIS — K264 Chronic or unspecified duodenal ulcer with hemorrhage: Secondary | ICD-10-CM

## 2022-08-08 DIAGNOSIS — K922 Gastrointestinal hemorrhage, unspecified: Secondary | ICD-10-CM | POA: Diagnosis not present

## 2022-08-08 DIAGNOSIS — I10 Essential (primary) hypertension: Secondary | ICD-10-CM

## 2022-08-08 DIAGNOSIS — Z87891 Personal history of nicotine dependence: Secondary | ICD-10-CM | POA: Diagnosis not present

## 2022-08-08 DIAGNOSIS — E669 Obesity, unspecified: Secondary | ICD-10-CM | POA: Diagnosis not present

## 2022-08-08 DIAGNOSIS — D62 Acute posthemorrhagic anemia: Secondary | ICD-10-CM | POA: Diagnosis not present

## 2022-08-08 DIAGNOSIS — K449 Diaphragmatic hernia without obstruction or gangrene: Secondary | ICD-10-CM | POA: Diagnosis not present

## 2022-08-08 DIAGNOSIS — R195 Other fecal abnormalities: Secondary | ICD-10-CM | POA: Diagnosis not present

## 2022-08-08 HISTORY — PX: HEMOSTASIS CLIP PLACEMENT: SHX6857

## 2022-08-08 HISTORY — PX: ENTEROSCOPY: SHX5533

## 2022-08-08 HISTORY — PX: HEMOSTASIS CONTROL: SHX6838

## 2022-08-08 LAB — CBC
HCT: 26.8 % — ABNORMAL LOW (ref 36.0–46.0)
Hemoglobin: 8.9 g/dL — ABNORMAL LOW (ref 12.0–15.0)
MCH: 31.3 pg (ref 26.0–34.0)
MCHC: 33.2 g/dL (ref 30.0–36.0)
MCV: 94.4 fL (ref 80.0–100.0)
Platelets: 384 10*3/uL (ref 150–400)
RBC: 2.84 MIL/uL — ABNORMAL LOW (ref 3.87–5.11)
RDW: 17.2 % — ABNORMAL HIGH (ref 11.5–15.5)
WBC: 7.9 10*3/uL (ref 4.0–10.5)
nRBC: 0 % (ref 0.0–0.2)

## 2022-08-08 LAB — BASIC METABOLIC PANEL
Anion gap: 6 (ref 5–15)
BUN: 8 mg/dL (ref 8–23)
CO2: 23 mmol/L (ref 22–32)
Calcium: 7.8 mg/dL — ABNORMAL LOW (ref 8.9–10.3)
Chloride: 107 mmol/L (ref 98–111)
Creatinine, Ser: 0.69 mg/dL (ref 0.44–1.00)
GFR, Estimated: >60 mL/min (ref >60)
Glucose, Bld: 154 mg/dL — ABNORMAL HIGH (ref 70–99)
Potassium: 3.2 mmol/L — ABNORMAL LOW (ref 3.5–5.1)
Sodium: 136 mmol/L (ref 135–145)

## 2022-08-08 LAB — COMPREHENSIVE METABOLIC PANEL
ALT: 10 U/L (ref 0–44)
AST: 11 U/L — ABNORMAL LOW (ref 15–41)
Albumin: 2.3 g/dL — ABNORMAL LOW (ref 3.5–5.0)
Alkaline Phosphatase: 38 U/L (ref 38–126)
Anion gap: <2 — ABNORMAL LOW (ref 5–15)
BUN: 7 mg/dL — ABNORMAL LOW (ref 8–23)
CO2: 24 mmol/L (ref 22–32)
Calcium: 7.1 mg/dL — ABNORMAL LOW (ref 8.9–10.3)
Chloride: 107 mmol/L (ref 98–111)
Creatinine, Ser: 0.53 mg/dL (ref 0.44–1.00)
GFR, Estimated: >60 mL/min (ref >60)
Glucose, Bld: 132 mg/dL — ABNORMAL HIGH (ref 70–99)
Potassium: 3.8 mmol/L (ref 3.5–5.1)
Sodium: 133 mmol/L — ABNORMAL LOW (ref 135–145)
Total Bilirubin: 0.9 mg/dL (ref 0.3–1.2)
Total Protein: 4.3 g/dL — ABNORMAL LOW (ref 6.5–8.1)

## 2022-08-08 LAB — HEMOGLOBIN AND HEMATOCRIT, BLOOD
HCT: 29.5 % — ABNORMAL LOW (ref 36.0–46.0)
HCT: 29.8 % — ABNORMAL LOW (ref 36.0–46.0)
Hemoglobin: 9.8 g/dL — ABNORMAL LOW (ref 12.0–15.0)
Hemoglobin: 9.8 g/dL — ABNORMAL LOW (ref 12.0–15.0)

## 2022-08-08 LAB — CBC WITH DIFFERENTIAL/PLATELET
Abs Immature Granulocytes: 0.1 10*3/uL — ABNORMAL HIGH (ref 0.00–0.07)
Basophils Absolute: 0.1 10*3/uL (ref 0.0–0.1)
Basophils Relative: 1 %
Eosinophils Absolute: 0.1 10*3/uL (ref 0.0–0.5)
Eosinophils Relative: 1 %
HCT: 29.8 % — ABNORMAL LOW (ref 36.0–46.0)
Hemoglobin: 10.1 g/dL — ABNORMAL LOW (ref 12.0–15.0)
Immature Granulocytes: 1 %
Lymphocytes Relative: 10 %
Lymphs Abs: 1.3 10*3/uL (ref 0.7–4.0)
MCH: 31.4 pg (ref 26.0–34.0)
MCHC: 33.9 g/dL (ref 30.0–36.0)
MCV: 92.5 fL (ref 80.0–100.0)
Monocytes Absolute: 0.8 10*3/uL (ref 0.1–1.0)
Monocytes Relative: 6 %
Neutro Abs: 10.5 10*3/uL — ABNORMAL HIGH (ref 1.7–7.7)
Neutrophils Relative %: 81 %
Platelets: 277 10*3/uL (ref 150–400)
RBC: 3.22 MIL/uL — ABNORMAL LOW (ref 3.87–5.11)
RDW: 15.9 % — ABNORMAL HIGH (ref 11.5–15.5)
WBC: 12.8 10*3/uL — ABNORMAL HIGH (ref 4.0–10.5)
nRBC: 0 % (ref 0.0–0.2)

## 2022-08-08 LAB — PHOSPHORUS: Phosphorus: 2.8 mg/dL (ref 2.5–4.6)

## 2022-08-08 LAB — PREPARE RBC (CROSSMATCH)

## 2022-08-08 LAB — MAGNESIUM: Magnesium: 1.6 mg/dL — ABNORMAL LOW (ref 1.7–2.4)

## 2022-08-08 LAB — MRSA NEXT GEN BY PCR, NASAL: MRSA by PCR Next Gen: NOT DETECTED

## 2022-08-08 SURGERY — ENTEROSCOPY
Anesthesia: General

## 2022-08-08 MED ORDER — SODIUM CHLORIDE 0.9 % IV BOLUS
1000.0000 mL | Freq: Once | INTRAVENOUS | Status: AC
  Administered 2022-08-08: 1000 mL via INTRAVENOUS

## 2022-08-08 MED ORDER — SODIUM CHLORIDE (PF) 0.9 % IJ SOLN
PREFILLED_SYRINGE | INTRAMUSCULAR | Status: DC | PRN
  Administered 2022-08-08: 4 mL

## 2022-08-08 MED ORDER — PHENYLEPHRINE 80 MCG/ML (10ML) SYRINGE FOR IV PUSH (FOR BLOOD PRESSURE SUPPORT)
PREFILLED_SYRINGE | INTRAVENOUS | Status: DC | PRN
  Administered 2022-08-08: 160 ug via INTRAVENOUS
  Administered 2022-08-08: 80 ug via INTRAVENOUS

## 2022-08-08 MED ORDER — SODIUM CHLORIDE 0.9 % IV SOLN: INTRAVENOUS | Status: DC

## 2022-08-08 MED ORDER — FENTANYL CITRATE (PF) 100 MCG/2ML IJ SOLN
INTRAMUSCULAR | Status: AC
  Filled 2022-08-08: qty 2

## 2022-08-08 MED ORDER — IOHEXOL 350 MG/ML SOLN
100.0000 mL | Freq: Once | INTRAVENOUS | Status: AC | PRN
  Administered 2022-08-08: 100 mL via INTRAVENOUS

## 2022-08-08 MED ORDER — MAGNESIUM SULFATE 2 GM/50ML IV SOLN
2.0000 g | Freq: Once | INTRAVENOUS | Status: AC
  Administered 2022-08-08: 2 g via INTRAVENOUS
  Filled 2022-08-08: qty 50

## 2022-08-08 MED ORDER — SUCCINYLCHOLINE CHLORIDE 200 MG/10ML IV SOSY
PREFILLED_SYRINGE | INTRAVENOUS | Status: DC | PRN
  Administered 2022-08-08: 100 mg via INTRAVENOUS

## 2022-08-08 MED ORDER — LACTATED RINGERS IV BOLUS
500.0000 mL | Freq: Once | INTRAVENOUS | Status: AC
  Administered 2022-08-08: 500 mL via INTRAVENOUS

## 2022-08-08 MED ORDER — DIPHENHYDRAMINE HCL 50 MG/ML IJ SOLN
25.0000 mg | Freq: Once | INTRAMUSCULAR | Status: AC
  Administered 2022-08-08: 25 mg via INTRAVENOUS
  Filled 2022-08-08: qty 1

## 2022-08-08 MED ORDER — LIDOCAINE 2% (20 MG/ML) 5 ML SYRINGE
INTRAMUSCULAR | Status: DC | PRN
  Administered 2022-08-08: 100 mg via INTRAVENOUS

## 2022-08-08 MED ORDER — PROPOFOL 10 MG/ML IV BOLUS
INTRAVENOUS | Status: DC | PRN
  Administered 2022-08-08: 140 mg via INTRAVENOUS

## 2022-08-08 MED ORDER — SODIUM CHLORIDE 0.9 % IV SOLN: INTRAVENOUS | Status: DC | PRN

## 2022-08-08 MED ORDER — FENTANYL CITRATE (PF) 100 MCG/2ML IJ SOLN
INTRAMUSCULAR | Status: DC | PRN
  Administered 2022-08-08 (×2): 50 ug via INTRAVENOUS

## 2022-08-08 MED ORDER — ONDANSETRON HCL 4 MG/2ML IJ SOLN
INTRAMUSCULAR | Status: DC | PRN
  Administered 2022-08-08: 4 mg via INTRAVENOUS

## 2022-08-08 MED ORDER — LACTATED RINGERS IV SOLN: INTRAVENOUS | Status: DC | PRN

## 2022-08-08 MED ORDER — NOREPINEPHRINE 4 MG/250ML-% IV SOLN: 0.0000 ug/min | INTRAVENOUS | Status: DC

## 2022-08-08 MED ORDER — SODIUM CHLORIDE 0.9% IV SOLUTION: Freq: Once | INTRAVENOUS | Status: AC

## 2022-08-08 NOTE — Plan of Care (Signed)
08/13/2022  Problem: Education: Goal: Knowledge of General Education information will improve Description: Including pain rating scale, medication(s)/side effects and non-pharmacologic comfort measures Outcome: Progressing   Problem: Health Behavior/Discharge Planning: Goal: Ability to manage health-related needs will improve Outcome: Progressing   Problem: Clinical Measurements: Goal: Ability to maintain clinical measurements within normal limits will improve Outcome: Progressing Goal: Will remain free from infection Outcome: Progressing Goal: Diagnostic test results will improve Outcome: Progressing Goal: Respiratory complications will improve Outcome: Progressing Goal: Cardiovascular complication will be avoided Outcome: Progressing   Problem: Activity: Goal: Risk for activity intolerance will decrease Outcome: Progressing   Problem: Nutrition: Goal: Adequate nutrition will be maintained Outcome: Progressing   Problem: Coping: Goal: Level of anxiety will decrease Outcome: Progressing   Problem: Elimination: Goal: Will not experience complications related to bowel motility Outcome: Progressing Goal: Will not experience complications related to urinary retention Outcome: Progressing   Problem: Pain Managment: Goal: General experience of comfort will improve Outcome: Progressing   Problem: Safety: Goal: Ability to remain free from injury will improve Outcome: Progressing   Problem: Skin Integrity: Goal: Risk for impaired skin integrity will decrease Outcome: Progressing  Cindy S. Brigitte Pulse BSN, RN, CCRP 08/21/2022 10:20 PM

## 2022-08-08 NOTE — Op Note (Signed)
[facility] Patient Name: [name] Procedure Date: 08/12/2022 MRN: [id number] Attending MD: Carol Ada , MD, [id number] Date of Birth: [birth year] CSN: [id number] Age: 61 Admit Type: Inpatient Procedure:                Upper GI endoscopy Indications:              Hematemesis, Hematochezia Providers:                Carol Ada, MD, Jeanella Cara, RN, Ladona Ridgel, Technician Referring MD:              Medicines:                Propofol per Anesthesia Complications:            No immediate complications. Estimated Blood Loss:     Estimated blood loss was minimal. Procedure:                Pre-Anesthesia Assessment:                           - Prior to the procedure, a History and Physical                            was performed, and patient medications and                            allergies were reviewed. The patient's tolerance of                            previous anesthesia was also reviewed. The risks                            and benefits of the procedure and the sedation                            options and risks were discussed with the patient.                            All questions were answered, and informed consent                            was obtained. Prior Anticoagulants: The patient has                            taken no anticoagulant or antiplatelet agents. ASA                            Grade Assessment: II - A patient with mild systemic                            disease. After reviewing the risks and benefits,  the patient was deemed in satisfactory condition to                            undergo the procedure.                           - Sedation was administered by an anesthesia                            professional. General anesthesia was attained.                           After obtaining informed consent, the endoscope was                            passed under  direct vision. Throughout the                            procedure, the patient's blood pressure, pulse, and                            oxygen saturations were monitored continuously. The                            [id number]-HQ190L ([id number]) Olympus colonoscope was                            introduced through the mouth and advanced to the                            third part of duodenum. After obtaining informed                            consent, the endoscope was passed under direct                            vision. Throughout the procedure, the patient's                            blood pressure, pulse, and oxygen saturations were                            monitored continuously.The upper GI endoscopy was                            technically difficult and complex. The patient                            tolerated the procedure well. Scope In: Scope Out: Findings:      A 3 cm hiatal hernia was present.      The stomach was normal.      One oozing cratered and superficial duodenal ulcer with oozing       hemorrhage (Forrest Class Ib) was found in the third portion of the  duodenum. The lesion was 5 mm in largest dimension. Area was       successfully injected with 4 mL of a 0.1 mg/mL solution of epinephrine       for drug delivery. For hemostasis, three hemostatic clips were       successfully placed (MR safe). Clip manufacturer: [employer].       There was no bleeding at the end of the procedure.      Starting with the pediatric colonoscope, for the enteroscopy, there was       no evidence of any blood in the upper GI tract and proximal small bowel.       The colonoscope was advanced to the site of the prior tattoo. During the       intubation process the prior hemoclip was identified and passed,       however, upon withdraw of the colonoscope bleeding was identified. There       was evidence of fresh arterial bleeding. The bleeding was localized to a       site obscured by  the hemoclip. Washing revealed rapid oozing of arterial       blood from this area. The duodenoscope was then employed. Because of       gastric looping, positioning with the duodenoscope was difficult, but it       did allow a good visualization of the lesion. This appeared to be a       small ulceration with a vessel. The exact vessel was not clearly       identified, but there was evidence of the ulcer. Four ml of 1:10,000 Epi       was injected around the area. With the severe angulation and looping of       the duodenoscope, the elevator was not able to properly lift the needle       into position. Only tangential injections were possible, but it was       adequate. Using a standard adult endoscope the site was identified. Two       mantis clips were deployed as well as a third ultraclip. This       effectively sealed the mucosal defect. No bleeding was induced when       hemostasis was being applied. Impression:               - 3 cm hiatal hernia.                           - Normal stomach.                           - Oozing duodenal ulcer with oozing hemorrhage                            (Forrest Class Ib). Injected. Clip manufacturer:                            [employer]. Clips (MR safe) were placed.                           - No specimens collected. Moderate Sedation:      Not Applicable - Patient had care per Anesthesia. Recommendation:           -  Return patient to hospital ward for ongoing care.                           - Clear liquid diet.                           - Continue present medications.                           - If bleeding recurs, then IR embolization at the                            hemoclip site is needed to arrest the bleeding. Procedure Code(s):        --- Professional ---                           [phone], Esophagogastroduodenoscopy, flexible,                            transoral; with control of bleeding, any method                            43236, 59, Esophagogastroduodenoscopy, flexible,                            transoral; with directed submucosal injection(s),                            any substance Diagnosis Code(s):        --- Professional ---                           K26.4, Chronic or unspecified duodenal ulcer with                            hemorrhage                           K44.9, Diaphragmatic hernia without obstruction or                            gangrene                           K92.0, Hematemesis                           K92.1, Melena (includes Hematochezia) CPT copyright 2022 [employer]. All rights reserved. The codes documented in this report are preliminary and upon coder review may  be revised to meet current compliance requirements. Carol Ada, MD Carol Ada, MD 08/07/2022 3:15:38 PM This report has been signed electronically. Number of Addenda: 0

## 2022-08-08 NOTE — Progress Notes (Signed)
4 Days Post-Op   Subjective/Chief Complaint: Events of last night noted. 1 episode of vomiting blood and passing blood per rectum. Received prbc's and hg went from 8.9 to 10.1. CT angio neg   Objective: Vital signs in last 24 hours: Temp:  [97.5 F (36.4 C)-98.2 F (36.8 C)] 97.9 F (36.6 C) (12/02 0250) Pulse Rate:  [49-118] 98 (12/02 0700) Resp:  [2-26] 3 (12/02 0700) BP: (64-112)/(32-76) 102/52 (12/02 0700) SpO2:  [88 %-100 %] 95 % (12/02 0700) Arterial Line BP: (82-115)/(69-108) 115/94 (12/01 1500) Last BM Date : 08/07/22  Intake/Output from previous day: 12/01 0701 - 12/02 0700 In: 2666.2 [P.O.:200; I.V.:778.9; Blood:688.3; IV Piggyback:999] Out: 9024 [OXBDZ:3299; Emesis/NG output:75; Stool:900] Intake/Output this shift: No intake/output data recorded.  General appearance: alert and cooperative Resp: clear to auscultation bilaterally Cardio: regular rate and rhythm GI: soft, minimal tenderness  Lab Results:  Recent Labs    09/06/2022 0000 08/14/2022 0400  WBC 7.9 12.8*  HGB 8.9* 10.1*  HCT 26.8* 29.8*  PLT 384 277   BMET Recent Labs    09/03/2022 0000 09/03/2022 0400  NA 136 133*  K 3.2* 3.8  CL 107 107  CO2 23 24  GLUCOSE 154* 132*  BUN 8 7*  CREATININE 0.69 0.53  CALCIUM 7.8* 7.1*   PT/INR Recent Labs    07/15/2022 1542  LABPROT 15.3*  INR 1.2   ABG No results for input(s): "PHART", "HCO3" in the last 72 hours.  Invalid input(s): "PCO2", "PO2"  Studies/Results: CT ANGIO GI BLEED  Result Date: 08/09/2022 CLINICAL DATA:  Hematemesis.  Rectal bleeding. EXAM: CTA ABDOMEN AND PELVIS WITHOUT AND WITH CONTRAST TECHNIQUE: Multidetector CT imaging of the abdomen and pelvis was performed using the standard protocol during bolus administration of intravenous contrast. Multiplanar reconstructed images and MIPs were obtained and reviewed to evaluate the vascular anatomy. RADIATION DOSE REDUCTION: This exam was performed according to the departmental  dose-optimization program which includes automated exposure control, adjustment of the mA and/or kV according to patient size and/or use of iterative reconstruction technique. CONTRAST:  156m OMNIPAQUE IOHEXOL 350 MG/ML SOLN COMPARISON:  Nuclear medicine study dated 07/30/2022 and 08/07/2022. GI bleed dated 08/01/2022. FINDINGS: VASCULAR Aorta: Moderate aortoiliac atherosclerotic disease. No aneurysmal dilatation or dissection. No periaortic fluid collection. Celiac: The origin of the celiac trunk is patent. There is common origin of the common hepatic artery and celiac trunk. SMA: The SMA is patent. Renals: The renal arteries appear patent. IMA: The IMA is patent. Inflow: Atherosclerotic calcification of the iliac arteries. Patent right aortofemoral bypass graft. The left iliac arteries are patent. Proximal Outflow: The visualized proximal outflow is patent. Veins: The IVC is unremarkable. The SMV, splenic vein, and main portal vein are patent. No portal venous gas. Review of the MIP images confirms the above findings. NON-VASCULAR Lower chest: Minimal left lung base atelectasis. The visualized lung bases are otherwise clear. There is coronary vascular calcification. No intra-abdominal free air or free fluid. Hepatobiliary: The liver is unremarkable. Mild biliary ductal dilatation, post cholecystectomy. No retained calcified stool noted in the central CBD. Pancreas: Unremarkable. No pancreatic ductal dilatation or surrounding inflammatory changes. Spleen: Normal in size without focal abnormality. Adrenals/Urinary Tract: The adrenal glands are unremarkable. There is no hydronephrosis or nephrolithiasis on either side. Small bilateral renal parapelvic cysts. Stomach/Bowel: There is a small hiatal hernia. High attenuating content within the distal esophagus and stomach containing small pockets of air may represent ingested matter but concerning for blood clot. There is an area of apparent  nodular thickening and  enhancement of the mucosa of the duodenal bulb measuring approximately 4.5 x 1.5 cm (axial 27/8). This may represent nodular folding of the mucosa or a lesion arising from the duodenal bulb. Further evaluation with endoscopy is recommended. There is loose stool throughout the colon consistent with diarrheal state. There is distal colonic diverticulosis without active inflammation. There is no bowel obstruction. No evidence of active GI bleed. The appendix is normal. Lymphatic: No adenopathy. Reproductive: The uterus is anteverted and grossly unremarkable. No adnexal mass. Other: None Musculoskeletal: Total right hip arthroplasty. Degenerative changes of the spine and L2-L3 fusion and disc spacer. No acute osseous pathology. IMPRESSION: 1. No evidence of active GI bleed. 2. High attenuating content within the distal esophagus and stomach may represent ingested matter or blood clot. 3. Focal nodular thickening of the duodenal bulb versus a lesion or polyp. Further evaluation with endoscopy is recommended. 4. Diarrheal state. No bowel obstruction. Normal appendix. 5. Colonic diverticulosis. 6.  Aortic Atherosclerosis (ICD10-I70.0). Electronically Signed   By: Anner Crete M.D.   On: 08/20/2022 01:07   NM Bowel Img Meckels  Result Date: 08/07/2022 CLINICAL DATA:  Rectal bleeding. Negative tagged red blood cell scan 1 day prior. EXAM: NUCLEAR MEDICINE BOWEL (MECKEL'S) SCAN TECHNIQUE: Sequential abdominal images were obtained following intravenous injection of radiopharmaceutical. RADIOPHARMACEUTICALS:  10.4 mCi Tc-12mpertechnetate IV COMPARISON:  None Available. FINDINGS: Prompt physiologic binding of radiotracer within the stomach gastric mucosa. No evidence of ectopic gastric mucosa in the abdomen pelvis over 1 hour imaging. Urine activity noted the bladder. IMPRESSION: No ectopic gastric mucosa identified to suggest Meckel's diverticulum. Negative Meckel's scan Electronically Signed   By: SSuzy Bouchard M.D.   On: 08/07/2022 14:08   NM GI Blood Loss  Result Date: 07/20/2022 CLINICAL DATA:  Bright red blood per rectum. Evaluate for active gastrointestinal bleeding. EXAM: NUCLEAR MEDICINE GASTROINTESTINAL BLEEDING SCAN TECHNIQUE: Sequential abdominal images were obtained following intravenous administration of Tc-930mabeled red blood cells. RADIOPHARMACEUTICALS:  20.3 mCi Tc-9935mrtechnetate in-vitro labeled red cells. COMPARISON:  CT angiogram 08/03/2022 FINDINGS: 2 hours of planar imaging were performed. No accumulation of tagged red blood cells within the small bowel or colon. Physiologic activity noted within the blood pool and solid organs. IMPRESSION: No evidence of active gastrointestinal bleeding over 2 hours imaging. Electronically Signed   By: SteSuzy BouchardD.   On: 08/03/2022 14:38   DG CHEST PORT 1 VIEW  Result Date: 07/30/2022 CLINICAL DATA:  [id number] Encounter for central line placement [id number]: PORTABLE CHEST 1 VIEW COMPARISON:  08/01/2022 chest radiograph. FINDINGS: Right PICC terminates over the cavoatrial junction. Partially visualized surgical hardware from ACDF. Spinal cerclage wire overlies the upper thoracic spine. Stable cardiomediastinal silhouette with top-normal heart size. No pneumothorax. No pleural effusion. Lungs appear clear, with no acute consolidative airspace disease and no pulmonary edema. IMPRESSION: Right PICC terminates over the cavoatrial junction. No pneumothorax. No active cardiopulmonary disease. Electronically Signed   By: JasIlona SorrelD.   On: 07/10/2022 11:49   [place] SITE RITE  Result Date: 08/05/2022 If Site Rite image not attached, placement could not be confirmed due to current cardiac rhythm.   Anti-infectives: Anti-infectives (From admission, onward)    None       Assessment/Plan: s/p Procedure(s): COLONOSCOPY WITH PROPOFOL (N/A) Will back off to clears in case she needs another endo or surgery. Hg had good response to  transfusion and seems stable now GI bleeding of unknown source Known hx of small  bowel AVMs - S/P small bowel enteroscopy 07/28/2022 identified a bleeding AVM in the duodenum with erosion as well as a separate single distal duodenal AVM both of which were ablated. She also had a clip placed in the area of a possible subepithelial lesion versus angulation. - Repeat small bowel enteroscopy showed erosions in the 3rd and 4th portions of the duodenum at prior APC sites without active bleeding.  - Colonoscopy 11/28 without active source of bleeding  - GI following. Given DDAVP 11/30. She is on octreotide and PPI currently.could add carafate - Tagged RBC scan negative 11/30, CTA negative on 11/26 and 11/28 - Per notes GI plans repeat CTA if she has any further bloody bm's and have IR on standby for possible fluoroscopic angiography if indicated. She is scheduled for a Meckle's scan today.  - GI suspects the AVM in distal small bowel seen on capsule endoscopy is the source of bleeding and is to distal to reach with push enteroscopy. They have spoken with [facility] for retrograde enteroscopy and double balloon enteroscopy or retrograde enteroscopy but were declined. I am unable to see these picture from capsule endoscopy. I asked if they would be able to locate report/results. Intraluminal bleeding would be very difficult to identify intra-op so would ask GI to see if they can narrow down exactly where this is located so if she does require an emergent resection we have a better chance at localizing this area. For now I do not think she needs any emergent surgery.  Her hgb is overall stable, she is not requiring pressors and is currently HDS . Agree with GI's current plans as noted above. We will continue to follow along.  ABL anemia - received prbc's overnight. Hg 10.1   FEN - Per GI VTE - SCDs, on hold ID - None   Atrial fibrillation not on anticoagulation  GERD Hx of MVP Scoliosis/lumbar  spondylosis Osteoarthritis   LOS: 6 days    Autumn Messing III 08/31/2022

## 2022-08-08 NOTE — Progress Notes (Signed)
       CROSS COVER NOTE  NAME: [name] MRN: [id number] DOB : August 13, 1961    Date of Service   08/09/2022   HPI/Events of Note   2345 - notified by bedside RN of episodes of hematemesis and rectal bleeding.Orders have been placed for stat CT angio GI bleed per GI recommendation, labs (CBC BMP).  Last hemoglobin at 2110 was 9.6.  Patient at bedside is noted to be pale and hypotensive, SBP 70-90.  She is alert and oriented x 4.  Husband is at bedside.  Compazine has already been given for nausea.  1 L normal saline bolus started.  Seeing as patient is hypotensive and actively bleeding, order was given for blood transfusion.  0000-hemoglobin 8.9  0100-CT angio GI bleed shows no evidence of active GI bleeding.  Approximate blood loss Hematemesis~100 cc Rectal bleeding~ 500-600 cc  0133-on-call GI MD was paged. 0138-GI MD Dr. Benson Norway was notified of patient's current events.  Seeing as CTA shows no active bleeding, he recommends continuing supportive measures at this time.  He agreed on current plan to transfuse 2 units of PRBC.  He was also made aware of the possibility for vasopressor need if MAP <65.     Interventions/ Plan   Above     Raenette Rover, DNP, [place]

## 2022-08-08 NOTE — Anesthesia Postprocedure Evaluation (Signed)
Anesthesia Post Note  Patient: [name]  Procedure(s) Performed: ENTEROSCOPY HEMOSTASIS CONTROL HEMOSTASIS CLIP PLACEMENT     Patient location during evaluation: Endoscopy Anesthesia Type: General Level of consciousness: awake and alert Pain management: pain level controlled Vital Signs Assessment: post-procedure vital signs reviewed and stable Respiratory status: spontaneous breathing, nonlabored ventilation, respiratory function stable and patient connected to nasal cannula oxygen Cardiovascular status: blood pressure returned to baseline and stable Postop Assessment: no apparent nausea or vomiting Anesthetic complications: no   No notable events documented.  Last Vitals:  Vitals:   08/27/2022 1530 08/13/2022 1643  BP: (!) 140/82   Pulse: 98   Resp: (!) 0   Temp:  36.4 C  SpO2: 99%     Last Pain:  Vitals:   08/29/2022 1643  TempSrc: Oral  PainSc:                  Santa Lighter

## 2022-08-08 NOTE — Progress Notes (Signed)
PROGRESS NOTE    [name]  FAO:[id number] DOB: 01/16/1961 DOA: 07/30/2022 PCP: Redmond School, MD   Brief Narrative:  Patient is an obese 61 year old Caucasian female with a past medical history significant for but not limited to proximal atrial fibrillation not on anticoagulation, history of iron deficiency anemia, osteoarthritis, GERD, history of GI bleed and duodenal AVM/telangiectasia presented with multiple episodes of hematemesis and rectal bleed and was not admitted due to GI bleeding.  Vitals were stable except for mild tachycardia and her presenting hemoglobin was 10.9 and it was 10.6 on the day that she was discharged on 07/30/2019 when she was discharged from the hospital.  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  Capsule endoscopy was done without active bleeding but she had 1 small nonbleeding AVM in the proximal small bowel.  She underwent a colonoscopy which felt that the small bowel was likely the source of her bleeding and preparation of the colon was inadequate due to melanotic stool throughout the colon.  She was found to have diverticulosis in the sigmoid colon and in the ascending colon examined portion ileum was normal.   Later in the day yesterday after her colonoscopy she had an active rectal bleed and had an episode of loss of consciousness blood pressure dropped and heart rate was 78.  She is transferred to the stepdown unit and a stat CT angio was done and was negative for any extravasation to suggest active bleeding.  And given 500 mL bolus and given 2 units of PRBCs.  GI was made aware of the situation and subsequently she is improved this morning with a hemoglobin being 9.4.  Her blood pressures remain the softer side but she feels okay.  The current plan was to try and get her transferred to a tertiary care  center at [facility] for a double-balloon enteroscopy however the GI new physicians declined and taking the patient stating extreme limited availability for double-balloon enteroscopy.  GI has now updated the plan to proceed with a Meckel scan in the morning and recommending continue aggressive supportive measures as well as transfusing as indicated.  GI is also considering a trial of octreotide if initiated then no need to plan to continue outpatient if there is no definitive source.  If the patient rebleeds prior to Meckel scan in the morning they are agreeable to proceed with a stat nuclear medicine bleeding scan.   **07/29/2022 had another syncopal episode this morning and had hematemesis and rectal bleeding.  She was given a 500 mL bolus and was typed and screened transfuse 2 more units of PRBCs.  GI was urgently consulted and her Meckel scan was discontinued and she was taken for a nuclear medicine bleeding scan.  Given her hypotension the PCCM team was consulted and they placed an arterial line for better blood pressure evaluation and also a PICC line was placed.  Further care pending the results of the nuclear medicine bleeding scan.   08/07/22: The nuclear medicine bleeding scan was negative and she stabilized.  She had a little bit of bleeding this morning with some cramping but no further syncope or lightheadedness.  She was less lethargic and was much more awake and alert and felt like her cell.  Tolerated her clinical diet and is going for Meckel scan today.  Surgery evaluated and felt no surgical intervention  at this time and given that her hemoglobin is overall stable and not requiring pressors they are going to continue to monitor and follow along.  Critical care following as well as recommending transfusion for hemoglobin less than 7 with hypotension and active bleeding.  Will continue PPI and octreotide for now  08/09/2022: Early morning she had hematemesis and rectal bleeding again and had to  have another stat angio CT GI bleeding scan.  Systolic blood pressures in the 70s to 90s and she was pale and hypotensive again.  She is given Compazine, 1 L normal saline bolus and was ordered for 2 units of blood.  Repeat hemoglobin was 8.9 and given her hematemesis and rectal bleeding GI was paged and given her repeat GI bleeding scan with no active bleeding GI is taking her for repeat endoscopy today.  She had a negative Meckel scan and general surgery is following as well.  We are attempting to try to transfer her to a tertiary care center that does double-balloon enteroscopy's.  Assessment and Plan:  Acute GI Bleed/Reactive Gastropathy with history of small bowel AVMs -Presents with hematemesis and rectal bleed.  H&H at baseline.  Not on blood thinners or NSAID.  -Hemodynamically stable now I had no further evidence of syncope-EGD and capsule endoscopy have been done.   -She had iron infusion previous hospitalization. -Given her syncopal episodes she was typed and screened and transfused 6 units of PRBCs and had a repeat emergent CTA which was negative for any extravasation to suggest active bleed -Monitor H&H every 6 hours -Continue IV Protonix -C/w CLD -Colonoscopy done and preparation of the colon was inadequate due to melanotic stool throughout the colon but there is diverticulosis in the sigmoid colon and the ascending.  The examined portion of the ileum was normal and examination was otherwise normal on direct and retroflexion views.  GI felt that the likely source of her bleeding was small bowel and was beyond the reach of the pediatric colonoscope. -Patient's hemoglobin/hematocrit after 4 units has now improved and has relatively stabilized and has gone from 7.1/22.7 -> 9.4/29.2 -> 9.5/29.3 -> 9.1/27.6 -> 9.2/27.7 -> 8.5/26.0 -> 9.5/29.3 -> 10.8/32.1 -> 9.6/28.9 -> 9.3/28.1 -> 9.8/29.8 -> 9.5/28.8 -> 9.6/28.4 -> 8.9/26.8 -> 10.1/29.8 -> 9.8/29.5 -Verbally consented for blood transfusion  if indicated -Follow further GI recommendation and given that she had a syncopal episode with 600 mL of bright red blood yesterday GI recommended attempting to get her transferred to [facility] for double-balloon enteroscopy however the GI physicians at [facility] declined to take the transfer stating extreme limited availability for the double-balloon enteroscopy. -GI was recommending proceeding with a Meckel scan this morning and continue aggressive supportive measures as well as transfusing as indicated however she decompensated further and had a vasovagal episode and had more hematemesis and rectal bleeding with clots.   -GI canceled the Meckel scan and opted for a stat nuclear medicine bleeding scan and she was typed and screened and transfused 2 units PRBCs initiated on octreotide as well as given a dose of DDAVP and a 500 mL bolus; GI recommending continuing to check H&H as posttransfusion and every 6 for 24 hours and have initiated octreotide at 50 mcg/h infusion after a 50 mcg bolus.  They are recommending continuing estrogen and progesterone -The tagged RBC scan was delayed due to her being hemodynamically stable in the setting of recurrent large-volume bleeding with large clots from her rectum.  After she is stabilized she was taken for the tagged  RBC and the GI team discussed with IR who reviewed the case and if her tagged RBC scan is positive she will need a stat CTA to x-ray target the area of bleeding required for fluoroscopic angiography -Further care pending nuclear medicine's and bleeding scan results per GI and she may get a repeat enteroscopy but her nuclear medicine bleeding scan was negative and repeat enteroscopy was referred -Given her tenuous blood pressures yesterday PCCM was consulted and they placed an arterial line and recommended a PICC line and the critical care physician is recommending a balanced transfusion strategy avoiding hypothermia, acidemia coagulopathy and recommending using  Levophed as needed -Underwent Meckel scan yesterday and was negative -Had another episode overnight and underwent a stat CT scan of the abdomen pelvis which was negative -DIC panel done and showed a D-dimer of 2.25, fibrinogen of 478, PT of 15.3 INR 1.2 and a PTT of 27 with no schistocytes seen on smear -Given her bleeding General surgery was consulted on standby but currently does not need any emergent surgery and is not requiring pressors  -Gastroenterology is planning repeat EGD today   Paroxysmal A-fib -In A-fib but rate controlled again.  Not on anticoagulation due to GI bleed and low CHADS2 score. -Cardizem 30 mg every 8 hours as needed for now -Resume home Cardizem CD once stable from bleeding standpoint but continue to hold given that she is hypotensive -Optimize electrolytes and continue monitor in the stepdown unit  Hyponatremia -Patient's Na+ went from 137 -> 136 -> 133 -Continue to Monitor and Trend   Hypomagnesemia -Patient's mag level was 1.6 -Replete with IV mag sulfate 2 g -Continue to monitor and replete as necessary -Repeat CMP in a.m.  Leukocytosis -Likely reactive this patient's WBC is gone from 7.9 is now 12.8 -Continue to monitor and trend and repeat CMP in a.m.   Recurrent Vasovagal syncope -Patient had an episode of syncope while sitting on toilet previously and again had another Syncopal episode on the evening of 08/05/27 No fall or trauma. -She had another Syncopal episode 07/09/2022 she had another large bloody bowel movement -Continue SDU telemetry Monitoring -The repeat syncopal episode on 11/28 happened after she had a large bloody bowel movement approximately 600 mL of cranberry liquid stool.  Blood pressure at that time was 78/42 and she had to be placed in Trendelenburg.  She had a stat CTA of the abdomen and was transferred to the stepdown unit given 500 mL bolus -CTA done and showed "VASCULAR   No definite evidence of active gastrointestinal  bleeding.   Atherosclerosis of abdominal aorta is noted without aneurysm or dissection. Patent right aortofemoral bypass graft is noted.   NON-VASCULAR   Low density is seen involving the inferior pole of the left kidney concerning for renal infarction or possibly pyelonephritis. This is new since prior exam." -She is given another 500 mL bolus on 07/18/2022 and transfused 2 units of PRBCs and taken for a stat nuclear medicine bleeding scan after an arterial line and a PICC line was placed.  Nuclear medicine bleeding scan was negative and she is undergoing a Meckel scan and this was negative and showed no ectopic gastric mucosa identified to suggest Meckel's diverticulum -Given her episode again overnight she was typed and screened and transfused 2 more units and underwent another stat CTA GI bleed and the scan showed no lesion identified in the duodenum at the site of vascular clips in the third portion of the duodenum with no small bowel lesion and  mild left colonic diverticulosis and a left lower lobe pneumonia noted.   Osteoarthritis/spinal stenosis/low back pain -Stable -C/w Tylenol as needed -Continue home tramadol   Hypoalbuminemia -The patient's albumin level is now gone from 2.6 is now 2.3 x2 -Continue to Monitor and Trend and Repeat CMP in the AM    Obesity -Complicates overall prognosis and care -Estimated body mass index is 35.73 kg/m as calculated from the following:   Height as of this encounter: '5\' 2"'$  (1.575 m).   Weight as of this encounter: 88.6 kg.  -Weight Loss and Dietary Counseling given  DVT prophylaxis: Place TED hose Start: 08/03/2022 0837 SCDs Start: 07/25/2022 1331    Code Status: Full Code Family Communication: Discussed with family at bedside  Disposition Plan:  Level of care: Stepdown Status is: Inpatient Remains inpatient appropriate because: She is a tenuous status and needs further clinical evaluation by specialists and getting a repeat EGD today    Consultants:  Gastroenterology PCCM critical care General Surgery  Interventional radiology is aware  Procedures:  COLONOSCOPY Findings:      A few medium-mouthed diverticula were found in the sigmoid colon and       ascending colon. Melanotic stool throughout the colon limiting the exam       despite aggressive lavage.      Some melanotic aspirate were noted in the TI as well which could easily       be washed. The terminal ileum appeared normal.      The exam was otherwise without abnormality on direct and retroflexion       views. Impression:               - Likely small bowel source of bleeding. Beyond the                            reach of pediatric colonoscope. No active bleeding.                           - Preparation of the colon was inadequate d/t                            melanotic stool throughout the colon.                           - Diverticulosis in the sigmoid colon and in the                            ascending colon.                           - The examined portion of the ileum was normal.                           - The examination was otherwise normal on direct                            and retroflexion views.                           - No specimens collected. Moderate Sedation:      none  Not Applicable - Patient had care per Anesthesia. Recommendation:           - Return patient to hospital ward for ongoing care.                           - Clear liquid diet.                           - Continue present medications.                           - Trend Hb/Hct. Transfuse to keep Hb>7                           - If any further active bleeding, CTA followed by                            IR angiography. Will discuss with IR if there is                            any utility to proceed with mesenteric angiography                            even with negative CTA.                           - If continued problems, single balloon enteroscopy                             vs double-balloon enteroscopy at [facility].                           - She would likely need LAR octreotide near                            discharge.                           - The findings and recommendations were discussed                            with the patient's family.   FURTHER PROCEDURES AS ABOVE   Antimicrobials:  Anti-infectives (From admission, onward)    None       Subjective: Seen and examined at bedside and was not doing as well again and had another episode last night early in the morning where she was bleeding and vomiting blood.  Blood pressure dropped again and she was pale and diaphoretic.  Patient complained of some abdominal discomfort.  Blood pressures are still on the low side.  Denies any other concerns or complaints at this time.  Objective: Vitals:   09/04/2022 1245 08/19/2022 1317 09/01/2022 1341 08/22/2022 1400  BP: (!) 92/50 115/65 (!) 111/57 139/78  Pulse: 72 (!) 102 95 97  Resp: (!) 0 '16 16 17  '$ Temp:  98.1 F (36.7 C) 97.8 F (36.6 C) (!) 97.4 F (36.3 C)  TempSrc:  Oral Oral Temporal  SpO2: 96%  98% 98% 99%  Weight:      Height:        Intake/Output Summary (Last 24 hours) at 08/09/2022 1441 Last data filed at 09/03/2022 1424 Gross per 24 hour  Intake 2946.06 ml  Output 2400 ml  Net 546.06 ml   Filed Weights   07/15/2022 1104 07/28/2022 1124 08/03/2022 1401  Weight: 88.6 kg 88.6 kg 88.6 kg   Examination: Physical Exam:  Constitutional: WN/WD obese Caucasian female in some distress and appears uncomfortable and anxious Respiratory: Diminished to auscultation bilaterally, no wheezing, rales, rhonchi or crackles. Normal respiratory effort and patient is not tachypenic. No accessory muscle use.  Cardiovascular: Irregularly irregular but not tachycardic, no murmurs / rubs / gallops. S1 and S2 auscultated. No extremity edema. 2+ pedal pulses. No carotid bruits.  Abdomen: Soft, a little-tender, distended second to body habitus. Bowel sounds  positive.  GU: Deferred. Musculoskeletal: No clubbing / cyanosis of digits/nails. No joint deformity upper and lower extremities.  Skin: No rashes, lesions, ulcers limited skin evaluation but does appear little pale. No induration; Warm and dry.  Neurologic: CN 2-12 grossly intact with no focal deficits.  Romberg sign and cerebellar reflexes not assessed.  Psychiatric: Normal judgment and insight. Alert and oriented x 3.  A little anxious mood and appropriate affect.   Data Reviewed: I have personally reviewed following labs and imaging studies  CBC: Recent Labs  Lab 07/19/2022 0845 07/22/2022 1341 08/05/22 0721 08/05/22 1102 08/03/2022 0251 08/01/2022 0737 07/08/2022 1542 07/26/2022 1543 08/07/22 0300 08/07/22 0900 08/07/22 1500 08/07/22 2110 08/07/2022 0000 08/07/2022 0400 09/03/2022 0949  WBC 13.5*   < > 7.6  --  8.1  --   --   --  8.1  --   --   --  7.9 12.8*  --   NEUTROABS 10.8*  --   --   --  4.8  --   --   --   --   --   --   --   --  10.5*  --   HGB 10.9*   < > 9.4*   < > 8.5*   < >  --    < > 9.4*  9.3*   < > 9.5* 9.6* 8.9* 10.1* 9.8*  HCT 33.7*   < > 29.2*   < > 26.0*   < >  --    < > 29.3*  28.1*   < > 28.8* 28.4* 26.8* 29.8* 29.5*  MCV 97.7   < > 96.1  --  97.0  --   --   --  94.5  --   --   --  94.4 92.5  --   PLT 334   < > 307  --  294  --  309  --  344  --   --   --  384 277  --    < > = values in this interval not displayed.   Basic Metabolic Panel: Recent Labs  Lab 08/05/22 0721 08/05/2022 0521 08/07/22 0300 08/20/2022 0000 09/02/2022 0400  NA 141 138 137 136 133*  K 3.7 3.6 4.2 3.2* 3.8  CL 111 109 107 107 107  CO2 '23 22 25 23 24  '$ GLUCOSE 88 84 123* 154* 132*  BUN '11 8 11 8 '$ 7*  CREATININE 0.66 0.58 0.59 0.69 0.53  CALCIUM 7.7* 8.1* 7.9* 7.8* 7.1*  MG 1.8 1.7  --   --  1.6*  PHOS 3.2 3.0  --   --  2.8  GFR: Estimated Creatinine Clearance: 75.4 mL/min (by C-G formula based on SCr of 0.53 mg/dL). Liver Function Tests: Recent Labs  Lab 07/18/2022 0845 08/03/22 0108  08/05/22 0721 08/05/22 1102 07/23/2022 0521 08/23/2022 0400  AST 22 14*  --  12* 11* 11*  ALT 18 14  --  '13 10 10  '$ ALKPHOS 41 37*  --  41 37* 38  BILITOT 1.0 0.6  --  0.8 0.6 0.9  PROT 6.6 5.6*  --  4.9* 4.6* 4.3*  ALBUMIN 3.3* 2.9* 2.6* 2.6* 2.3* 2.3*   No results for input(s): "LIPASE", "AMYLASE" in the last 168 hours. No results for input(s): "AMMONIA" in the last 168 hours. Coagulation Profile: Recent Labs  Lab 07/30/2022 0845 08/01/2022 1542  INR 1.1 1.2   Cardiac Enzymes: No results for input(s): "CKTOTAL", "CKMB", "CKMBINDEX", "TROPONINI" in the last 168 hours. BNP (last 3 results) Recent Labs    03/24/22 1110  PROBNP 1,025*   HbA1C: No results for input(s): "HGBA1C" in the last 72 hours. CBG: Recent Labs  Lab 08/03/22 2101 07/25/2022 1816  GLUCAP 124* 107*   Lipid Profile: No results for input(s): "CHOL", "HDL", "LDLCALC", "TRIG", "CHOLHDL", "LDLDIRECT" in the last 72 hours. Thyroid Function Tests: No results for input(s): "TSH", "T4TOTAL", "FREET4", "T3FREE", "THYROIDAB" in the last 72 hours. Anemia Panel: No results for input(s): "VITAMINB12", "FOLATE", "FERRITIN", "TIBC", "IRON", "RETICCTPCT" in the last 72 hours. Sepsis Labs: Recent Labs  Lab 08/05/2022 0845  LATICACIDVEN 1.9    Recent Results (from the past 240 hour(s))  MRSA Next Gen by PCR, Nasal     Status: None   Collection Time: 08/07/22 11:10 PM   Specimen: Nasal Mucosa; Nasal Swab  Result Value Ref Range Status   MRSA by PCR Next Gen NOT DETECTED NOT DETECTED Final    Comment: (NOTE) The GeneXpert MRSA Assay (FDA approved for NASAL specimens only), is one component of a comprehensive MRSA colonization surveillance program. It is not intended to diagnose MRSA infection nor to guide or monitor treatment for MRSA infections. Test performance is not FDA approved in patients less than 70 years old. Performed at [facility], [place] [address] [place], [place] [address]      Radiology Studies: CT ANGIO GI BLEED  Result Date: 08/24/2022 CLINICAL DATA:  Hematemesis.  Rectal bleeding. EXAM: CTA ABDOMEN AND PELVIS WITHOUT AND WITH CONTRAST TECHNIQUE: Multidetector CT imaging of the abdomen and pelvis was performed using the standard protocol during bolus administration of intravenous contrast. Multiplanar reconstructed images and MIPs were obtained and reviewed to evaluate the vascular anatomy. RADIATION DOSE REDUCTION: This exam was performed according to the departmental dose-optimization program which includes automated exposure control, adjustment of the mA and/or kV according to patient size and/or use of iterative reconstruction technique. CONTRAST:  149m OMNIPAQUE IOHEXOL 350 MG/ML SOLN COMPARISON:  Nuclear medicine study dated 07/29/2022 and 08/07/2022. GI bleed dated 08/03/2022. FINDINGS: VASCULAR Aorta: Moderate aortoiliac atherosclerotic disease. No aneurysmal dilatation or dissection. No periaortic fluid collection. Celiac: The origin of the celiac trunk is patent. There is common origin of the common hepatic artery and celiac trunk. SMA: The SMA is patent. Renals: The renal arteries appear patent. IMA: The IMA is patent. Inflow: Atherosclerotic calcification of the iliac arteries. Patent right aortofemoral bypass graft. The left iliac arteries are patent. Proximal Outflow: The visualized proximal outflow is patent. Veins: The IVC is unremarkable. The SMV, splenic vein, and main portal vein are patent. No portal venous gas. Review of the MIP images confirms the  above findings. NON-VASCULAR Lower chest: Minimal left lung base atelectasis. The visualized lung bases are otherwise clear. There is coronary vascular calcification. No intra-abdominal free air or free fluid. Hepatobiliary: The liver is unremarkable. Mild biliary ductal dilatation, post cholecystectomy. No retained calcified stool noted in the central CBD. Pancreas: Unremarkable. No pancreatic ductal dilatation or  surrounding inflammatory changes. Spleen: Normal in size without focal abnormality. Adrenals/Urinary Tract: The adrenal glands are unremarkable. There is no hydronephrosis or nephrolithiasis on either side. Small bilateral renal parapelvic cysts. Stomach/Bowel: There is a small hiatal hernia. High attenuating content within the distal esophagus and stomach containing small pockets of air may represent ingested matter but concerning for blood clot. There is an area of apparent nodular thickening and enhancement of the mucosa of the duodenal bulb measuring approximately 4.5 x 1.5 cm (axial 27/8). This may represent nodular folding of the mucosa or a lesion arising from the duodenal bulb. Further evaluation with endoscopy is recommended. There is loose stool throughout the colon consistent with diarrheal state. There is distal colonic diverticulosis without active inflammation. There is no bowel obstruction. No evidence of active GI bleed. The appendix is normal. Lymphatic: No adenopathy. Reproductive: The uterus is anteverted and grossly unremarkable. No adnexal mass. Other: None Musculoskeletal: Total right hip arthroplasty. Degenerative changes of the spine and L2-L3 fusion and disc spacer. No acute osseous pathology. IMPRESSION: 1. No evidence of active GI bleed. 2. High attenuating content within the distal esophagus and stomach may represent ingested matter or blood clot. 3. Focal nodular thickening of the duodenal bulb versus a lesion or polyp. Further evaluation with endoscopy is recommended. 4. Diarrheal state. No bowel obstruction. Normal appendix. 5. Colonic diverticulosis. 6.  Aortic Atherosclerosis (ICD10-I70.0). Electronically Signed   By: Anner Crete M.D.   On: 08/12/2022 01:07   NM Bowel Img Meckels  Result Date: 08/07/2022 CLINICAL DATA:  Rectal bleeding. Negative tagged red blood cell scan 1 day prior. EXAM: NUCLEAR MEDICINE BOWEL (MECKEL'S) SCAN TECHNIQUE: Sequential abdominal images were  obtained following intravenous injection of radiopharmaceutical. RADIOPHARMACEUTICALS:  10.4 mCi Tc-65mpertechnetate IV COMPARISON:  None Available. FINDINGS: Prompt physiologic binding of radiotracer within the stomach gastric mucosa. No evidence of ectopic gastric mucosa in the abdomen pelvis over 1 hour imaging. Urine activity noted the bladder. IMPRESSION: No ectopic gastric mucosa identified to suggest Meckel's diverticulum. Negative Meckel's scan Electronically Signed   By: SSuzy BouchardM.D.   On: 08/07/2022 14:08    Scheduled Meds:  [MAR Hold] sodium chloride   Intravenous Once   [MAR Hold] sodium chloride   Intravenous Once   [MAR Hold] Chlorhexidine Gluconate Cloth  6 each Topical Daily   [MAR Hold] estrogens-methylTEST  1 tablet Oral Daily   [MAR Hold] medroxyPROGESTERone  5 mg Oral Daily   [MAR Hold] melatonin  10 mg Oral QHS   [MAR Hold] pantoprazole (PROTONIX) IV  40 mg Intravenous Q12H   [MAR Hold] sodium chloride flush  10-40 mL Intracatheter Q12H   Continuous Infusions:  [MAR Hold] sodium chloride     sodium chloride     octreotide (SANDOSTATIN) 500 mcg in sodium chloride 0.9 % 250 mL (2 mcg/mL) infusion 50 mcg/hr (09/05/2022 1414)    LOS: 6 days   ORaiford Noble DO [facility] Available via Epic secure chat 7am-7pm After these hours, please refer to coverage provider listed on [email or web address] 08/11/2022, 2:41 PM

## 2022-08-08 NOTE — Anesthesia Procedure Notes (Signed)
Procedure Name: Intubation Date/Time: 08/13/2022 2:18 PM  Performed by: Niel Hummer, CRNAPre-anesthesia Checklist: Patient identified, Emergency Drugs available, Suction available and Patient being monitored Patient Re-evaluated:Patient Re-evaluated prior to induction Oxygen Delivery Method: Circle system utilized Preoxygenation: Pre-oxygenation with 100% oxygen Induction Type: IV induction and Rapid sequence Laryngoscope Size: Mac and 4 Grade View: Grade I Tube type: Oral Tube size: 7.0 mm Number of attempts: 1 Airway Equipment and Method: Stylet Placement Confirmation: ETT inserted through vocal cords under direct vision, positive ETCO2 and breath sounds checked- equal and bilateral Secured at: 23 cm Tube secured with: Tape Dental Injury: Teeth and Oropharynx as per pre-operative assessment

## 2022-08-08 NOTE — Progress Notes (Signed)
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  Please see GI procedure notes over the past couple months as she has had multiple EGDs, colonoscopy, and small bowel enteroscopy.  She has been here couple days getting worked up for this recurrence when overnight she began having more brisk bleeding with associated hypotension.  PCCM consulted to help manage.  Currently denies dizziness, abdominal pain.  Has had a couple episodes of hematemesis overnight.  Ongoing bright red blood from rectum during evaluation.  Pertinent  Medical History  reflux Mitral valve prolapse Osteoarthritis  Significant Hospital Events: Including procedures, antibiotic start and stop dates in addition to other pertinent events   11/26 admit, CTA negative 11/26 small bowel enteroscopy no active bleeding seen, multiple sites of prior hemoclips in small bowel 11/28 colonoscopy no active bleeding, melena in colon, medium diverticuli  11/30 PCCM consult. Patient with continued bleeding with hematemesis and large red blood per rectum with abd pain  12/2 acute hematemesis/hematochezia  Interim History / Subjective:  Required 2 units RBC and IV fluids over night for acute GI bleeding with hematemesis and hematochezia.   CTA GI Bleed scan did not reveal extravasation  Spoke with GI, plan for EGD today  Family updated at bedside.  Objective   Blood pressure (!) 102/52, pulse 98, temperature 97.9 F (36.6 C), temperature source Axillary, resp. rate (!) 3, height '5\' 2"'$  (1.575 m), weight 88.6 kg, SpO2 95 %.        Intake/Output Summary (Last 24 hours) at 08/21/2022 0732 Last data filed at 08/15/2022  0631 Gross per 24 hour  Intake 2666.19 ml  Output 2550 ml  Net 116.19 ml   Filed Weights   08/03/2022 1104 07/26/2022 1124 07/17/2022 1401  Weight: 88.6 kg 88.6 kg 88.6 kg   Examination: General: Adult female, lying in bed, acutely ill appearing HENT: Mucous membranes are moist, trachea midline Lungs: Clear breath sounds, no use of accessory muscles  Cardiovascular: tachycarcid, no mRG  Abdomen: Soft, active bowel sounds, generalized, no tenderness Extremities: no edema Neuro: alert, oriented, follows commands   Resolved Hospital Problem list   Not applicable  Assessment & Plan:   Shock-recurrent vagal events versus hemorrhagic Recurrent GI bleed with history of small bowel AVMs - Small bowel enteroscopy 11/21 > bleeding AVM at the duodenum with erosion and a single distal duodenal AVM  - CTA 11/26, 11/28 and 12/2 negative for active bleed - Bowel enteroscopy 11/26 erosions in the 3rd and 4th portions of the  duodenum corresponding w/ prior APC sites without active bleeding  - Colonoscopy 11/28 showed no active bleeding found in the colon, there was melena in the colon which was lavaged, some melanotic aspirate also noted in terminal ileum and a few medium sized diverticuli noted in the sigmoid and ascending colon  - NM 11/30 negative for active bleed  - Negative Meckel's study 12/1  Plan - Not requiring vasopressors at this time. MAP goal >65  - GI/Gen surg following. Plan for EGD today. - Trend CBC q6h, transfuse for hemoglobin <7 or with hypotension/active bleeding  - Continue PPI/octreotide - Balanced transfusion strategy, avoid hypothermia, acidemia, coagulopathy  Best Practice (right click  and "Reselect all SmartList Selections" daily)   Diet/type: NPO DVT prophylaxis: SCD GI prophylaxis: PPI Lines: PICC Foley:  N/A Code Status:  full code Last date of multidisciplinary goals of care discussion [pending]  Labs   CBC: Recent Labs  Lab 07/27/2022 0845 07/14/2022 1341  08/05/22 0721 08/05/22 1102 07/26/2022 0251 07/22/2022 0737 07/14/2022 1542 07/15/2022 1543 08/07/22 0300 08/07/22 0900 08/07/22 1500 08/07/22 2110 08/12/2022 0000 08/28/2022 0400  WBC 13.5*   < > 7.6  --  8.1  --   --   --  8.1  --   --   --  7.9 12.8*  NEUTROABS 10.8*  --   --   --  4.8  --   --   --   --   --   --   --   --  10.5*  HGB 10.9*   < > 9.4*   < > 8.5*   < >  --    < > 9.4*  9.3* 9.8* 9.5* 9.6* 8.9* 10.1*  HCT 33.7*   < > 29.2*   < > 26.0*   < >  --    < > 29.3*  28.1* 29.8* 28.8* 28.4* 26.8* 29.8*  MCV 97.7   < > 96.1  --  97.0  --   --   --  94.5  --   --   --  94.4 92.5  PLT 334   < > 307  --  294  --  309  --  344  --   --   --  384 277   < > = values in this interval not displayed.    Basic Metabolic Panel: Recent Labs  Lab 08/05/22 0721 08/01/2022 0521 08/07/22 0300 08/18/2022 0000 08/26/2022 0400  NA 141 138 137 136 133*  K 3.7 3.6 4.2 3.2* 3.8  CL 111 109 107 107 107  CO2 '23 22 25 23 24  '$ GLUCOSE 88 84 123* 154* 132*  BUN '11 8 11 8 '$ 7*  CREATININE 0.66 0.58 0.59 0.69 0.53  CALCIUM 7.7* 8.1* 7.9* 7.8* 7.1*  MG 1.8 1.7  --   --  1.6*  PHOS 3.2 3.0  --   --  2.8   GFR: Estimated Creatinine Clearance: 75.4 mL/min (by C-G formula based on SCr of 0.53 mg/dL). Recent Labs  Lab 07/10/2022 0845 08/03/22 0108 07/08/2022 0251 08/07/22 0300 08/25/2022 0000 08/15/2022 0400  WBC 13.5*   < > 8.1 8.1 7.9 12.8*  LATICACIDVEN 1.9  --   --   --   --   --    < > = values in this interval not displayed.    Liver Function Tests: Recent Labs  Lab 07/12/2022 0845 08/03/22 0108 08/05/22 0721 08/05/22 1102 07/13/2022 0521 08/13/2022 0400  AST 22 14*  --  12* 11* 11*  ALT 18 14  --  '13 10 10  '$ ALKPHOS 41 37*  --  41 37* 38  BILITOT 1.0 0.6  --  0.8 0.6 0.9  PROT 6.6 5.6*  --  4.9* 4.6* 4.3*  ALBUMIN 3.3* 2.9* 2.6* 2.6* 2.3* 2.3*   No results for input(s): "LIPASE", "AMYLASE" in the last 168 hours. No results for input(s): "AMMONIA" in the last 168 hours.  ABG No results found  for: "PHART", "PCO2ART", "PO2ART", "HCO3", "TCO2", "ACIDBASEDEF", "O2SAT"   Coagulation Profile: Recent Labs  Lab 08/05/2022 0845 07/09/2022 1542  INR 1.1 1.2    Cardiac Enzymes: No results for input(s): "CKTOTAL", "CKMB", "CKMBINDEX", "TROPONINI"  in the last 168 hours.  HbA1C: No results found for: "HGBA1C"  CBG: Recent Labs  Lab 08/03/22 2101 07/15/2022 1816  GLUCAP 124* 107*    Freda Jackson, MD [facility] Office: [phone]

## 2022-08-08 NOTE — Anesthesia Preprocedure Evaluation (Signed)
Anesthesia Evaluation  Patient identified by MRN, date of birth, ID band Patient awake    Reviewed: Allergy & Precautions, NPO status , Patient's Chart, lab work & pertinent test results  Airway Mallampati: II  TM Distance: >3 FB Neck ROM: Full    Dental  (+) Teeth Intact, Dental Advisory Given   Pulmonary former smoker   Pulmonary exam normal breath sounds clear to auscultation       Cardiovascular hypertension, Pt. 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   Endo/Other  Obesity   Renal/GU negative Renal ROS     Musculoskeletal  (+) Arthritis ,    Abdominal   Peds  Hematology  (+) Blood dyscrasia, anemia   Anesthesia Other Findings   Reproductive/Obstetrics                              Anesthesia Physical Anesthesia Plan  ASA: 3 and emergent  Anesthesia Plan: General   Post-op Pain Management: Minimal or no pain anticipated   Induction: Intravenous, Rapid sequence and Cricoid pressure planned  PONV Risk Score and Plan: 3 and Dexamethasone and Ondansetron  Airway Management Planned: Oral ETT  Additional Equipment:   Intra-op Plan:   Post-operative Plan: Extubation in OR  Informed Consent: I have reviewed the patients History and Physical, chart, labs and discussed the procedure including the risks, benefits and alternatives for the proposed anesthesia with the patient or authorized representative who has indicated his/her understanding and acceptance.     Dental advisory given  Plan Discussed with: CRNA  Anesthesia Plan Comments:           Anesthesia Quick Evaluation

## 2022-08-08 NOTE — Transfer of Care (Signed)
Immediate Anesthesia Transfer of Care Note  Patient: [name]  Procedure(s) Performed: ENTEROSCOPY HEMOSTASIS CONTROL HEMOSTASIS CLIP PLACEMENT  Patient Location: PACU  Anesthesia Type:General  Level of Consciousness: awake, alert , and oriented  Airway & Oxygen Therapy: Patient Spontanous Breathing and Patient connected to face mask oxygen  Post-op Assessment: Report given to RN, Post -op Vital signs reviewed and stable, and Patient moving all extremities X 4  Post vital signs: Reviewed and stable  Last Vitals:  Vitals Value Taken Time  BP 104/69   Temp    Pulse 94 08/28/2022 1504  Resp 20 08/20/2022 1504  SpO2 100 % 09/02/2022 1504  Vitals shown include unvalidated device data.  Last Pain:  Vitals:   08/09/2022 1400  TempSrc: Temporal  PainSc: 0-No pain      Patients Stated Pain Goal: 0 (20/81/38 8719)  Complications: No notable events documented.

## 2022-08-09 DIAGNOSIS — K922 Gastrointestinal hemorrhage, unspecified: Secondary | ICD-10-CM | POA: Diagnosis not present

## 2022-08-09 DIAGNOSIS — D62 Acute posthemorrhagic anemia: Secondary | ICD-10-CM | POA: Diagnosis not present

## 2022-08-09 DIAGNOSIS — E669 Obesity, unspecified: Secondary | ICD-10-CM | POA: Diagnosis not present

## 2022-08-09 DIAGNOSIS — R195 Other fecal abnormalities: Secondary | ICD-10-CM | POA: Diagnosis not present

## 2022-08-09 DIAGNOSIS — K260 Acute duodenal ulcer with hemorrhage: Secondary | ICD-10-CM

## 2022-08-09 LAB — COMPREHENSIVE METABOLIC PANEL
ALT: 5 U/L (ref 0–44)
AST: 11 U/L — ABNORMAL LOW (ref 15–41)
Albumin: 2.4 g/dL — ABNORMAL LOW (ref 3.5–5.0)
Alkaline Phosphatase: 34 U/L — ABNORMAL LOW (ref 38–126)
Anion gap: 3 — ABNORMAL LOW (ref 5–15)
BUN: 10 mg/dL (ref 8–23)
CO2: 28 mmol/L (ref 22–32)
Calcium: 7.9 mg/dL — ABNORMAL LOW (ref 8.9–10.3)
Chloride: 110 mmol/L (ref 98–111)
Creatinine, Ser: 0.64 mg/dL (ref 0.44–1.00)
GFR, Estimated: >60 mL/min (ref >60)
Glucose, Bld: 118 mg/dL — ABNORMAL HIGH (ref 70–99)
Potassium: 3.8 mmol/L (ref 3.5–5.1)
Sodium: 141 mmol/L (ref 135–145)
Total Bilirubin: 0.6 mg/dL (ref 0.3–1.2)
Total Protein: 4.1 g/dL — ABNORMAL LOW (ref 6.5–8.1)

## 2022-08-09 LAB — CBC WITH DIFFERENTIAL/PLATELET
Abs Immature Granulocytes: 0.05 10*3/uL (ref 0.00–0.07)
Basophils Absolute: 0.1 10*3/uL (ref 0.0–0.1)
Basophils Relative: 1 %
Eosinophils Absolute: 0.2 10*3/uL (ref 0.0–0.5)
Eosinophils Relative: 2 %
HCT: 29.6 % — ABNORMAL LOW (ref 36.0–46.0)
Hemoglobin: 9.9 g/dL — ABNORMAL LOW (ref 12.0–15.0)
Immature Granulocytes: 1 %
Lymphocytes Relative: 24 %
Lymphs Abs: 2 10*3/uL (ref 0.7–4.0)
MCH: 30.7 pg (ref 26.0–34.0)
MCHC: 33.4 g/dL (ref 30.0–36.0)
MCV: 91.9 fL (ref 80.0–100.0)
Monocytes Absolute: 0.7 10*3/uL (ref 0.1–1.0)
Monocytes Relative: 8 %
Neutro Abs: 5.4 10*3/uL (ref 1.7–7.7)
Neutrophils Relative %: 64 %
Platelets: 293 10*3/uL (ref 150–400)
RBC: 3.22 MIL/uL — ABNORMAL LOW (ref 3.87–5.11)
RDW: 16.5 % — ABNORMAL HIGH (ref 11.5–15.5)
WBC: 8.3 10*3/uL (ref 4.0–10.5)
nRBC: 0 % (ref 0.0–0.2)

## 2022-08-09 LAB — MAGNESIUM: Magnesium: 1.9 mg/dL (ref 1.7–2.4)

## 2022-08-09 LAB — PHOSPHORUS: Phosphorus: 2.7 mg/dL (ref 2.5–4.6)

## 2022-08-09 LAB — HEMOGLOBIN AND HEMATOCRIT, BLOOD
HCT: 32.4 % — ABNORMAL LOW (ref 36.0–46.0)
HCT: 30.6 % — ABNORMAL LOW (ref 36.0–46.0)
HCT: 27.3 % — ABNORMAL LOW (ref 36.0–46.0)
Hemoglobin: 10.6 g/dL — ABNORMAL LOW (ref 12.0–15.0)
Hemoglobin: 10 g/dL — ABNORMAL LOW (ref 12.0–15.0)
Hemoglobin: 9.1 g/dL — ABNORMAL LOW (ref 12.0–15.0)

## 2022-08-09 MED ORDER — PANTOPRAZOLE SODIUM 40 MG PO TBEC
40.0000 mg | DELAYED_RELEASE_TABLET | Freq: Every day | ORAL | Status: DC
  Administered 2022-08-09: 40 mg via ORAL
  Filled 2022-08-09: qty 1

## 2022-08-09 MED ORDER — ORAL CARE MOUTH RINSE: 15.0000 mL | OROMUCOSAL | Status: DC | PRN

## 2022-08-09 NOTE — Progress Notes (Signed)
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  Please see GI procedure notes over the past couple months as she has had multiple EGDs, colonoscopy, and small bowel enteroscopy.  She has been here couple days getting worked up for this recurrence when overnight she began having more brisk bleeding with associated hypotension.  PCCM consulted to help manage.  Currently denies dizziness, abdominal pain.  Has had a couple episodes of hematemesis overnight.  Ongoing bright red blood from rectum during evaluation.  Pertinent  Medical History  reflux Mitral valve prolapse Osteoarthritis  Significant Hospital Events: Including procedures, antibiotic start and stop dates in addition to other pertinent events   11/26 admit, CTA negative 11/26 small bowel enteroscopy no active bleeding seen, multiple sites of prior hemoclips in small bowel 11/28 colonoscopy no active bleeding, melena in colon, medium diverticuli  11/30 PCCM consult. Patient with continued bleeding with hematemesis and large red blood per rectum with abd pain  12/2 acute hematemesis/hematochezia s/p EGD with visible vessel in duodenal ulcer with clip placement  Interim History / Subjective:  No hematochezia or hematemesis overnight She feels much better today  Objective   Blood pressure 99/60, pulse 84, temperature 98.3 F (36.8 C), temperature source Oral, resp. rate 15, height '5\' 2"'$  (1.575 m), weight 88.6 kg, SpO2 98 %.        Intake/Output Summary (Last 24 hours) at 08/09/2022 0732 Last data filed at 08/09/2022 0600 Gross per 24 hour  Intake 1261.89 ml  Output 1325 ml  Net -63.11 ml   Filed Weights    07/14/2022 1104 08/03/2022 1124 07/11/2022 1401  Weight: 88.6 kg 88.6 kg 88.6 kg   Examination: General: Adult female, lying in bed, no acute distress HENT: Mucous membranes are moist, trachea midline Lungs: Clear breath sounds, no use of accessory muscles  Cardiovascular: tachycarcid, no mRG  Abdomen: Soft, active bowel sounds, generalized, no tenderness Extremities: no edema Neuro: alert, oriented, follows commands   Resolved Hospital Problem list   Not applicable  Assessment & Plan:   Shock-recurrent vagal events versus hemorrhagic Recurrent GI bleed with history of small bowel AVMs - Small bowel enteroscopy 11/21 > bleeding AVM at the duodenum with erosion and a single distal duodenal AVM  - CTA 11/26, 11/28 and 12/2 negative for active bleed - Bowel enteroscopy 11/26 erosions in the 3rd and 4th portions of the  duodenum corresponding w/ prior APC sites without active bleeding  - Colonoscopy 11/28 showed no active bleeding found in the colon, there was melena in the colon which was lavaged, some melanotic aspirate also noted in terminal ileum and a few medium sized diverticuli noted in the sigmoid and ascending colon  - NM 11/30 negative for active bleed  - Negative Meckel's study 12/1 - 12/2 EGD notable for visible duodenal ulcer with vessel s/p clip placement  Plan - Not requiring vasopressors at this time. MAP goal >65  - GI/Gen surg following.  - Trend CBC q6h, transfuse for hemoglobin <7 or with hypotension/active bleeding  - Continue PPI/octreotide - Balanced transfusion strategy, avoid hypothermia, acidemia, coagulopathy  PCCM will sign off. Please call if any other changes occur.   Best Practice (  right click and "Reselect all SmartList Selections" daily)   Per primary  Labs   CBC: Recent Labs  Lab 07/31/2022 0845 07/16/2022 1341 07/23/2022 0251 07/19/2022 0737 08/05/2022 1542 07/08/2022 1543 08/07/22 0300 08/07/22 0900 09/04/2022 0000 08/31/2022 0400 08/21/2022 0949  08/13/2022 2034 08/09/22 0400  WBC 13.5*   < > 8.1  --   --   --  8.1  --  7.9 12.8*  --   --  8.3  NEUTROABS 10.8*  --  4.8  --   --   --   --   --   --  10.5*  --   --  5.4  HGB 10.9*   < > 8.5*   < >  --    < > 9.4*  9.3*   < > 8.9* 10.1* 9.8* 9.8* 9.9*  HCT 33.7*   < > 26.0*   < >  --    < > 29.3*  28.1*   < > 26.8* 29.8* 29.5* 29.8* 29.6*  MCV 97.7   < > 97.0  --   --   --  94.5  --  94.4 92.5  --   --  91.9  PLT 334   < > 294  --  309  --  344  --  384 277  --   --  293   < > = values in this interval not displayed.    Basic Metabolic Panel: Recent Labs  Lab 08/05/22 0721 08/01/2022 0521 08/07/22 0300 08/13/2022 0000 08/31/2022 0400 08/09/22 0400  NA 141 138 137 136 133* 141  K 3.7 3.6 4.2 3.2* 3.8 3.8  CL 111 109 107 107 107 110  CO2 '23 22 25 23 24 28  '$ GLUCOSE 88 84 123* 154* 132* 118*  BUN '11 8 11 8 '$ 7* 10  CREATININE 0.66 0.58 0.59 0.69 0.53 0.64  CALCIUM 7.7* 8.1* 7.9* 7.8* 7.1* 7.9*  MG 1.8 1.7  --   --  1.6* 1.9  PHOS 3.2 3.0  --   --  2.8 2.7   GFR: Estimated Creatinine Clearance: 75.4 mL/min (by C-G formula based on SCr of 0.64 mg/dL). Recent Labs  Lab 07/15/2022 0845 08/03/22 0108 08/07/22 0300 09/02/2022 0000 08/21/2022 0400 08/09/22 0400  WBC 13.5*   < > 8.1 7.9 12.8* 8.3  LATICACIDVEN 1.9  --   --   --   --   --    < > = values in this interval not displayed.    Liver Function Tests: Recent Labs  Lab 08/03/22 0108 08/05/22 0721 08/05/22 1102 07/23/2022 0521 08/15/2022 0400 08/09/22 0400  AST 14*  --  12* 11* 11* 11*  ALT 14  --  '13 10 10 5  '$ ALKPHOS 37*  --  41 37* 38 34*  BILITOT 0.6  --  0.8 0.6 0.9 0.6  PROT 5.6*  --  4.9* 4.6* 4.3* 4.1*  ALBUMIN 2.9* 2.6* 2.6* 2.3* 2.3* 2.4*   No results for input(s): "LIPASE", "AMYLASE" in the last 168 hours. No results for input(s): "AMMONIA" in the last 168 hours.  ABG No results found for: "PHART", "PCO2ART", "PO2ART", "HCO3", "TCO2", "ACIDBASEDEF", "O2SAT"   Coagulation Profile: Recent Labs  Lab  07/25/2022 0845 07/19/2022 1542  INR 1.1 1.2    Cardiac Enzymes: No results for input(s): "CKTOTAL", "CKMB", "CKMBINDEX", "TROPONINI" in the last 168 hours.  HbA1C: No results found for: "HGBA1C"  CBG: Recent Labs  Lab 08/03/22 2101 07/27/2022 1816  GLUCAP 124* 107*  Freda Jackson, MD [facility] Office: [phone]

## 2022-08-09 NOTE — Progress Notes (Signed)
Subjective: Feeling well.  No reports of any hematochezia or hematemesis.  Objective: Vital signs in last 24 hours: Temp:  [97.4 F (36.3 C)-98.3 F (36.8 C)] 98.3 F (36.8 C) (12/03 0356) Pulse Rate:  [49-129] 89 (12/03 0800) Resp:  [0-23] 19 (12/03 0800) BP: (70-140)/(42-82) 93/44 (12/03 0800) SpO2:  [93 %-100 %] 93 % (12/03 0800) Last BM Date : 08/07/2022  Intake/Output from previous day: 12/02 0701 - 12/03 0700 In: 1261.9 [I.V.:885.9; Blood:326; IV Piggyback:50] Out: 1325 [Urine:925; Stool:400] Intake/Output this shift: Total I/O In: 54.9 [I.V.:54.9] Out: -   General appearance: alert and no distress GI: soft, non-tender; bowel sounds normal; no masses,  no organomegaly  Lab Results: Recent Labs    08/22/2022 0000 08/21/2022 0400 08/13/2022 0949 09/05/2022 2034 08/09/22 0400  WBC 7.9 12.8*  --   --  8.3  HGB 8.9* 10.1* 9.8* 9.8* 9.9*  HCT 26.8* 29.8* 29.5* 29.8* 29.6*  PLT 384 277  --   --  293   BMET Recent Labs    08/16/2022 0000 08/14/2022 0400 08/09/22 0400  NA 136 133* 141  K 3.2* 3.8 3.8  CL 107 107 110  CO2 '23 24 28  '$ GLUCOSE 154* 132* 118*  BUN 8 7* 10  CREATININE 0.69 0.53 0.64  CALCIUM 7.8* 7.1* 7.9*   LFT Recent Labs    08/09/22 0400  PROT 4.1*  ALBUMIN 2.4*  AST 11*  ALT 5  ALKPHOS 34*  BILITOT 0.6   PT/INR Recent Labs    07/22/2022 1542  LABPROT 15.3*  INR 1.2   Hepatitis Panel No results for input(s): "HEPBSAG", "HCVAB", "HEPAIGM", "HEPBIGM" in the last 72 hours. C-Diff No results for input(s): "CDIFFTOX" in the last 72 hours. Fecal Lactopherrin No results for input(s): "FECLLACTOFRN" in the last 72 hours.  Studies/Results: CT ANGIO GI BLEED  Result Date: 08/30/2022 CLINICAL DATA:  Hematemesis.  Rectal bleeding. EXAM: CTA ABDOMEN AND PELVIS WITHOUT AND WITH CONTRAST TECHNIQUE: Multidetector CT imaging of the abdomen and pelvis was performed using the standard protocol during bolus administration of intravenous contrast. Multiplanar  reconstructed images and MIPs were obtained and reviewed to evaluate the vascular anatomy. RADIATION DOSE REDUCTION: This exam was performed according to the departmental dose-optimization program which includes automated exposure control, adjustment of the mA and/or kV according to patient size and/or use of iterative reconstruction technique. CONTRAST:  166m OMNIPAQUE IOHEXOL 350 MG/ML SOLN COMPARISON:  Nuclear medicine study dated 07/13/2022 and 08/07/2022. GI bleed dated 07/25/2022. FINDINGS: VASCULAR Aorta: Moderate aortoiliac atherosclerotic disease. No aneurysmal dilatation or dissection. No periaortic fluid collection. Celiac: The origin of the celiac trunk is patent. There is common origin of the common hepatic artery and celiac trunk. SMA: The SMA is patent. Renals: The renal arteries appear patent. IMA: The IMA is patent. Inflow: Atherosclerotic calcification of the iliac arteries. Patent right aortofemoral bypass graft. The left iliac arteries are patent. Proximal Outflow: The visualized proximal outflow is patent. Veins: The IVC is unremarkable. The SMV, splenic vein, and main portal vein are patent. No portal venous gas. Review of the MIP images confirms the above findings. NON-VASCULAR Lower chest: Minimal left lung base atelectasis. The visualized lung bases are otherwise clear. There is coronary vascular calcification. No intra-abdominal free air or free fluid. Hepatobiliary: The liver is unremarkable. Mild biliary ductal dilatation, post cholecystectomy. No retained calcified stool noted in the central CBD. Pancreas: Unremarkable. No pancreatic ductal dilatation or surrounding inflammatory changes. Spleen: Normal in size without focal abnormality. Adrenals/Urinary Tract: The adrenal glands  are unremarkable. There is no hydronephrosis or nephrolithiasis on either side. Small bilateral renal parapelvic cysts. Stomach/Bowel: There is a small hiatal hernia. High attenuating content within the distal  esophagus and stomach containing small pockets of air may represent ingested matter but concerning for blood clot. There is an area of apparent nodular thickening and enhancement of the mucosa of the duodenal bulb measuring approximately 4.5 x 1.5 cm (axial 27/8). This may represent nodular folding of the mucosa or a lesion arising from the duodenal bulb. Further evaluation with endoscopy is recommended. There is loose stool throughout the colon consistent with diarrheal state. There is distal colonic diverticulosis without active inflammation. There is no bowel obstruction. No evidence of active GI bleed. The appendix is normal. Lymphatic: No adenopathy. Reproductive: The uterus is anteverted and grossly unremarkable. No adnexal mass. Other: None Musculoskeletal: Total right hip arthroplasty. Degenerative changes of the spine and L2-L3 fusion and disc spacer. No acute osseous pathology. IMPRESSION: 1. No evidence of active GI bleed. 2. High attenuating content within the distal esophagus and stomach may represent ingested matter or blood clot. 3. Focal nodular thickening of the duodenal bulb versus a lesion or polyp. Further evaluation with endoscopy is recommended. 4. Diarrheal state. No bowel obstruction. Normal appendix. 5. Colonic diverticulosis. 6.  Aortic Atherosclerosis (ICD10-I70.0). Electronically Signed   By: Anner Crete M.D.   On: 08/23/2022 01:07   NM Bowel Img Meckels  Result Date: 08/07/2022 CLINICAL DATA:  Rectal bleeding. Negative tagged red blood cell scan 1 day prior. EXAM: NUCLEAR MEDICINE BOWEL (MECKEL'S) SCAN TECHNIQUE: Sequential abdominal images were obtained following intravenous injection of radiopharmaceutical. RADIOPHARMACEUTICALS:  10.4 mCi Tc-38mpertechnetate IV COMPARISON:  None Available. FINDINGS: Prompt physiologic binding of radiotracer within the stomach gastric mucosa. No evidence of ectopic gastric mucosa in the abdomen pelvis over 1 hour imaging. Urine activity noted  the bladder. IMPRESSION: No ectopic gastric mucosa identified to suggest Meckel's diverticulum. Negative Meckel's scan Electronically Signed   By: SSuzy BouchardM.D.   On: 08/07/2022 14:08    Medications: Scheduled:  sodium chloride   Intravenous Once   Chlorhexidine Gluconate Cloth  6 each Topical Daily   estrogens-methylTEST  1 tablet Oral Daily   medroxyPROGESTERone  5 mg Oral Daily   melatonin  10 mg Oral QHS   pantoprazole (PROTONIX) IV  40 mg Intravenous Q12H   sodium chloride flush  10-40 mL Intracatheter Q12H   Continuous:  sodium chloride     octreotide (SANDOSTATIN) 500 mcg in sodium chloride 0.9 % 250 mL (2 mcg/mL) infusion 50 mcg/hr (08/09/22 0816)    Assessment/Plan: 1) S/p small duodenal ulcer with a visible vessel. 2) Anemia - stable.   The patient's HGB is stable.  She does not have any evidence of any ongoing bleeding.  Clinically she feels well.  Hopefully the clip will be the definitive treatment.  Plan: 1) Advance to a regular diet. 2) Change pantoprazole to QD orally. 3) [facility] to resume care in the AM.  LOS: 7 days   Haley Fuerstenberg D 08/09/2022, 8:25 AM

## 2022-08-09 NOTE — Progress Notes (Signed)
1 Day Post-Op   Subjective/Chief Complaint: Events of yesterday noted. Appreciate Dr. Ulyses [name] assistance. Pt feels much better today   Objective: Vital signs in last 24 hours: Temp:  [97.4 F (36.3 C)-98.3 F (36.8 C)] 98.3 F (36.8 C) (12/03 0356) Pulse Rate:  [49-129] 84 (12/03 0711) Resp:  [0-23] 15 (12/03 0711) BP: (70-140)/(42-82) 99/60 (12/03 0711) SpO2:  [93 %-100 %] 98 % (12/03 0711) Last BM Date : 09/01/2022  Intake/Output from previous day: 12/02 0701 - 12/03 0700 In: 1261.9 [I.V.:885.9; Blood:326; IV Piggyback:50] Out: 1325 [Urine:925; Stool:400] Intake/Output this shift: No intake/output data recorded.  General appearance: alert and cooperative Resp: clear to auscultation bilaterally Cardio: regular rate and rhythm GI: soft, minimal tenderness  Lab Results:  Recent Labs    08/11/2022 0400 08/25/2022 0949 08/28/2022 2034 08/09/22 0400  WBC 12.8*  --   --  8.3  HGB 10.1*   < > 9.8* 9.9*  HCT 29.8*   < > 29.8* 29.6*  PLT 277  --   --  293   < > = values in this interval not displayed.   BMET Recent Labs    09/03/2022 0400 08/09/22 0400  NA 133* 141  K 3.8 3.8  CL 107 110  CO2 24 28  GLUCOSE 132* 118*  BUN 7* 10  CREATININE 0.53 0.64  CALCIUM 7.1* 7.9*   PT/INR Recent Labs    07/18/2022 1542  LABPROT 15.3*  INR 1.2   ABG No results for input(s): "PHART", "HCO3" in the last 72 hours.  Invalid input(s): "PCO2", "PO2"  Studies/Results: CT ANGIO GI BLEED  Result Date: 08/22/2022 CLINICAL DATA:  Hematemesis.  Rectal bleeding. EXAM: CTA ABDOMEN AND PELVIS WITHOUT AND WITH CONTRAST TECHNIQUE: Multidetector CT imaging of the abdomen and pelvis was performed using the standard protocol during bolus administration of intravenous contrast. Multiplanar reconstructed images and MIPs were obtained and reviewed to evaluate the vascular anatomy. RADIATION DOSE REDUCTION: This exam was performed according to the departmental dose-optimization program which includes  automated exposure control, adjustment of the mA and/or kV according to patient size and/or use of iterative reconstruction technique. CONTRAST:  145m OMNIPAQUE IOHEXOL 350 MG/ML SOLN COMPARISON:  Nuclear medicine study dated 07/25/2022 and 08/07/2022. GI bleed dated 07/15/2022. FINDINGS: VASCULAR Aorta: Moderate aortoiliac atherosclerotic disease. No aneurysmal dilatation or dissection. No periaortic fluid collection. Celiac: The origin of the celiac trunk is patent. There is common origin of the common hepatic artery and celiac trunk. SMA: The SMA is patent. Renals: The renal arteries appear patent. IMA: The IMA is patent. Inflow: Atherosclerotic calcification of the iliac arteries. Patent right aortofemoral bypass graft. The left iliac arteries are patent. Proximal Outflow: The visualized proximal outflow is patent. Veins: The IVC is unremarkable. The SMV, splenic vein, and main portal vein are patent. No portal venous gas. Review of the MIP images confirms the above findings. NON-VASCULAR Lower chest: Minimal left lung base atelectasis. The visualized lung bases are otherwise clear. There is coronary vascular calcification. No intra-abdominal free air or free fluid. Hepatobiliary: The liver is unremarkable. Mild biliary ductal dilatation, post cholecystectomy. No retained calcified stool noted in the central CBD. Pancreas: Unremarkable. No pancreatic ductal dilatation or surrounding inflammatory changes. Spleen: Normal in size without focal abnormality. Adrenals/Urinary Tract: The adrenal glands are unremarkable. There is no hydronephrosis or nephrolithiasis on either side. Small bilateral renal parapelvic cysts. Stomach/Bowel: There is a small hiatal hernia. High attenuating content within the distal esophagus and stomach containing small pockets of air may represent  ingested matter but concerning for blood clot. There is an area of apparent nodular thickening and enhancement of the mucosa of the duodenal bulb  measuring approximately 4.5 x 1.5 cm (axial 27/8). This may represent nodular folding of the mucosa or a lesion arising from the duodenal bulb. Further evaluation with endoscopy is recommended. There is loose stool throughout the colon consistent with diarrheal state. There is distal colonic diverticulosis without active inflammation. There is no bowel obstruction. No evidence of active GI bleed. The appendix is normal. Lymphatic: No adenopathy. Reproductive: The uterus is anteverted and grossly unremarkable. No adnexal mass. Other: None Musculoskeletal: Total right hip arthroplasty. Degenerative changes of the spine and L2-L3 fusion and disc spacer. No acute osseous pathology. IMPRESSION: 1. No evidence of active GI bleed. 2. High attenuating content within the distal esophagus and stomach may represent ingested matter or blood clot. 3. Focal nodular thickening of the duodenal bulb versus a lesion or polyp. Further evaluation with endoscopy is recommended. 4. Diarrheal state. No bowel obstruction. Normal appendix. 5. Colonic diverticulosis. 6.  Aortic Atherosclerosis (ICD10-I70.0). Electronically Signed   By: Anner Crete M.D.   On: 08/25/2022 01:07   NM Bowel Img Meckels  Result Date: 08/07/2022 CLINICAL DATA:  Rectal bleeding. Negative tagged red blood cell scan 1 day prior. EXAM: NUCLEAR MEDICINE BOWEL (MECKEL'S) SCAN TECHNIQUE: Sequential abdominal images were obtained following intravenous injection of radiopharmaceutical. RADIOPHARMACEUTICALS:  10.4 mCi Tc-38mpertechnetate IV COMPARISON:  None Available. FINDINGS: Prompt physiologic binding of radiotracer within the stomach gastric mucosa. No evidence of ectopic gastric mucosa in the abdomen pelvis over 1 hour imaging. Urine activity noted the bladder. IMPRESSION: No ectopic gastric mucosa identified to suggest Meckel's diverticulum. Negative Meckel's scan Electronically Signed   By: SSuzy BouchardM.D.   On: 08/07/2022 14:08     Anti-infectives: Anti-infectives (From admission, onward)    None       Assessment/Plan: s/p Procedure(s): ENTEROSCOPY (N/A) HEMOSTASIS CONTROL HEMOSTASIS CLIP PLACEMENT Hg stable at 9.9 Upper endo yesterday able to clip and inject ulcer Allow clears today if ok with GI Continue to monitor in icu  LOS: 7 days    PAutumn MessingIII 08/09/2022

## 2022-08-09 NOTE — Progress Notes (Addendum)
PROGRESS NOTE    [name]  PJA:[id number] DOB: 1961/01/14 DOA: 07/27/2022 PCP: Redmond School, MD   Brief Narrative:  Patient is an obese 61 year old Caucasian female with a past medical history significant for but not limited to proximal atrial fibrillation not on anticoagulation, history of iron deficiency anemia, osteoarthritis, GERD, history of GI bleed and duodenal AVM/telangiectasia presented with multiple episodes of hematemesis and rectal bleed and was not admitted due to GI bleeding.  Vitals were stable except for mild tachycardia and her presenting hemoglobin was 10.9 and it was 10.6 on the day that she was discharged on 07/30/2019 when she was discharged from the hospital.  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  Capsule endoscopy was done without active bleeding but she had 1 small nonbleeding AVM in the proximal small bowel.  She underwent a colonoscopy which felt that the small bowel was likely the source of her bleeding and preparation of the colon was inadequate due to melanotic stool throughout the colon.  She was found to have diverticulosis in the sigmoid colon and in the ascending colon examined portion ileum was normal.   Later in the day yesterday after her colonoscopy she had an active rectal bleed and had an episode of loss of consciousness blood pressure dropped and heart rate was 78.  She is transferred to the stepdown unit and a stat CT angio was done and was negative for any extravasation to suggest active bleeding.  And given 500 mL bolus and given 2 units of PRBCs.  GI was made aware of the situation and subsequently she is improved this morning with a hemoglobin being 9.4.  Her blood pressures remain the softer side but she feels okay.  The current plan was to try and get her transferred to a tertiary care  center at [facility] for a double-balloon enteroscopy however the GI new physicians declined and taking the patient stating extreme limited availability for double-balloon enteroscopy.  GI has now updated the plan to proceed with a Meckel scan in the morning and recommending continue aggressive supportive measures as well as transfusing as indicated.  GI is also considering a trial of octreotide if initiated then no need to plan to continue outpatient if there is no definitive source.  If the patient rebleeds prior to Meckel scan in the morning they are agreeable to proceed with a stat nuclear medicine bleeding scan.   **07/08/2022 had another syncopal episode this morning and had hematemesis and rectal bleeding.  She was given a 500 mL bolus and was typed and screened transfuse 2 more units of PRBCs.  GI was urgently consulted and her Meckel scan was discontinued and she was taken for a nuclear medicine bleeding scan.  Given her hypotension the PCCM team was consulted and they placed an arterial line for better blood pressure evaluation and also a PICC line was placed.  Further care pending the results of the nuclear medicine bleeding scan.   08/07/22: The nuclear medicine bleeding scan was negative and she stabilized.  She had a little bit of bleeding this morning with some cramping but no further syncope or lightheadedness.  She was less lethargic and was much more awake and alert and felt like her cell.  Tolerated her clinical diet and is going for Meckel scan today.  Surgery evaluated and felt no surgical intervention  at this time and given that her hemoglobin is overall stable and not requiring pressors they are going to continue to monitor and follow along.  Critical care following as well as recommending transfusion for hemoglobin less than 7 with hypotension and active bleeding.  Will continue PPI and octreotide for now   08/15/2022: Early morning she had hematemesis and rectal bleeding again and had  to have another stat angio CT GI bleeding scan.  Systolic blood pressures in the 70s to 90s and she was pale and hypotensive again.  She is given Compazine, 1 L normal saline bolus and was ordered for 2 units of blood.  Repeat hemoglobin was 8.9 and given her hematemesis and rectal bleeding GI was paged and given her repeat GI bleeding scan with no active bleeding GI is taking her for repeat endoscopy today.  She had a negative Meckel scan and general surgery is following as well.  We are attempting to try to transfer her to a tertiary care center that does double-balloon enteroscopy's.  Patient underwent repeat EGD and she was found to have an oozing cratered and superficial duodenal ulcer with oozing hemorrhage and was found in the third portion of the duodenum being 5 mm in largest diameter and was successfully injected and hemostasis was achieved with 3 hemostatic clips.  She was brought back to the unit and placed on a clear liquid diet and now diet has been advanced to a regular diet.  She is improving and continues to have some bloody stools but could be old blood.  Assessment and Plan:  Acute GI Bleed/Reactive Gastropathy with history of small bowel AVMs -Presents with hematemesis and rectal bleed.  H&H at baseline.  Not on blood thinners or NSAID.  -Hemodynamically stable now I had no further evidence of syncope-EGD and capsule endoscopy have been done.   -She had iron infusion previous hospitalization. -Given her syncopal episodes she was typed and screened and transfused 6 units of PRBCs and had a repeat emergent CTA which was negative for any extravasation to suggest active bleed -Monitor H&H every 6 hours -Continue IV Protonix -C/w CLD -Colonoscopy done and preparation of the colon was inadequate due to melanotic stool throughout the colon but there is diverticulosis in the sigmoid colon and the ascending.  The examined portion of the ileum was normal and examination was otherwise normal  on direct and retroflexion views.  GI felt that the likely source of her bleeding was small bowel and was beyond the reach of the pediatric colonoscope. -Patient's hemoglobin/hematocrit after 7 units has now improved and has relatively stabilized and has gone from 7.1/22.7 -> 9.4/29.2 -> 9.5/29.3 -> 9.1/27.6 -> 9.2/27.7 -> 8.5/26.0 -> 9.5/29.3 -> 10.8/32.1 -> 9.6/28.9 -> 9.3/28.1 -> 9.8/29.8 -> 9.5/28.8 -> 9.6/28.4 -> 8.9/26.8 -> 10.1/29.8 -> 9.8/29.5 -> 9.8/29.5 -> 9.9/29.6 -> 10.6/32.4 -Verbally consented for blood transfusion if indicated -Follow further GI recommendation and given that she had a syncopal episode with 600 mL of bright red blood yesterday GI recommended attempting to get her transferred to [facility] for double-balloon enteroscopy however the GI physicians at [facility] declined to take the transfer stating extreme limited availability for the double-balloon enteroscopy. -GI was recommending proceeding with a Meckel scan this morning and continue aggressive supportive measures as well as transfusing as indicated however she decompensated further and had a vasovagal episode and had more hematemesis and rectal bleeding with clots.   -GI canceled the Meckel scan and opted for a stat nuclear medicine bleeding scan and  she was typed and screened and transfused 2 units PRBCs initiated on octreotide as well as given a dose of DDAVP and a 500 mL bolus; GI recommending continuing to check H&H as posttransfusion and every 6 for 24 hours and have initiated octreotide at 50 mcg/h infusion after a 50 mcg bolus.  They are recommending continuing estrogen and progesterone -The tagged RBC scan was delayed due to her being hemodynamically stable in the setting of recurrent large-volume bleeding with large clots from her rectum.  After she is stabilized she was taken for the tagged RBC and the GI team discussed with IR who reviewed the case and if her tagged RBC scan is positive she will need a stat CTA to x-ray target  the area of bleeding required for fluoroscopic angiography -Further care pending nuclear medicine's and bleeding scan results per GI and she may get a repeat enteroscopy but her nuclear medicine bleeding scan was negative and repeat enteroscopy was referred -Given her tenuous blood pressures yesterday PCCM was consulted and they placed an arterial line and recommended a PICC line and the critical care physician is recommending a balanced transfusion strategy avoiding hypothermia, acidemia coagulopathy and recommending using Levophed as needed -Underwent Meckel scan yesterday and was negative -Had another episode overnight and underwent a stat CT scan of the abdomen pelvis which was negative -DIC panel done and showed a D-dimer of 2.25, fibrinogen of 478, PT of 15.3 INR 1.2 and a PTT of 27 with no schistocytes seen on smear -Given her bleeding General surgery was consulted on standby but currently does not need any emergent surgery and is not requiring pressors  -Gastroenterology is planning repeat EGD and found a 3 cm hiatal hernia, normal stomach: Oozing duodenal ulcer with oozing hemorrhage was injected in had 3 MR clips placed. -She was placed on a clear liquid diet after EGD is now on a soft diet and doing well and feeling better.;  If bleeding reoccurs IR embolization at the Hemoclip site is needed to rest the bleeding then   Paroxysmal A-fib -In A-fib but rate controlled again.  Not on anticoagulation due to GI bleed and low CHADS2 score. -Cardizem 30 mg every 8 hours as needed for now -Resume home Cardizem CD once stable from bleeding standpoint but continue to hold given that she is hypotensive -Optimize electrolytes and continue monitor in the stepdown unit   Hyponatremia -Patient's Na+ went from 137 -> 136 -> 133 -> 141 -Continue to Monitor and Trend    Hypomagnesemia -Patient's mag level was 1.6 and improved to 1.9 -Replete with IV mag sulfate 2 g yesterday  -Continue to monitor and  replete as necessary -Repeat CMP in a.m.   Leukocytosis -Likely reactive this patient's WBC is gone from 7.9 -> 12.8 -> 8.3 -Continue to monitor and trend and repeat CMP in a.m.   Recurrent Vasovagal syncope -Patient had an episode of syncope while sitting on toilet previously and again had another Syncopal episode on the evening of 08/05/27 No fall or trauma. -She had another Syncopal episode 08/01/2022 she had another large bloody bowel movement -Continue SDU telemetry Monitoring -The repeat syncopal episode on 11/28 happened after she had a large bloody bowel movement approximately 600 mL of cranberry liquid stool.  Blood pressure at that time was 78/42 and she had to be placed in Trendelenburg.  She had a stat CTA of the abdomen and was transferred to the stepdown unit given 500 mL bolus -CTA done and showed "VASCULAR  No definite evidence of active gastrointestinal bleeding.   Atherosclerosis of abdominal aorta is noted without aneurysm or dissection. Patent right aortofemoral bypass graft is noted.   NON-VASCULAR   Low density is seen involving the inferior pole of the left kidney concerning for renal infarction or possibly pyelonephritis. This is new since prior exam." -She is given another 500 mL bolus on 08/05/2022 and transfused 2 units of PRBCs and taken for a stat nuclear medicine bleeding scan after an arterial line and a PICC line was placed.  Nuclear medicine bleeding scan was negative and she is undergoing a Meckel scan and this was negative and showed no ectopic gastric mucosa identified to suggest Meckel's diverticulum -Given her episode again overnight she was typed and screened and transfused 2 more units and underwent another stat CTA GI bleed and the scan showed no lesion identified in the duodenum at the site of vascular clips in the third portion of the duodenum with no small bowel lesion and mild left colonic diverticulosis and a left lower lobe pneumonia  noted. -Repeat Orthostatic VS in the AM as site of bleeding likely addressed    Osteoarthritis/spinal stenosis/low back pain -Stable -C/w Tylenol as needed -Continue home tramadol   Hypoalbuminemia -The patient's albumin level is now gone from 2.6 is now 2.3 x2 -> 2.4 -Continue to Monitor and Trend and Repeat CMP in the AM    Obesity -Complicates overall prognosis and care -Estimated body mass index is 35.73 kg/m as calculated from the following:   Height as of this encounter: '5\' 2"'$  (1.575 m).   Weight as of this encounter: 88.6 kg.  -Weight Loss and Dietary Counseling given  DVT prophylaxis: Place TED hose Start: 07/21/2022 0837 SCDs Start: 07/25/2022 1331    Code Status: Full Code Family Communication: Discussed with her husband at bedside  Disposition Plan:  Level of care: Stepdown Status is: Inpatient Remains inpatient appropriate because: Has had her oozing ulcer that was clipped yesterday and diet is being advanced.  Will need final clearance by the GI team prior to safe discharge disposition   Consultants:  Gastroenterology PCCM critical care General Surgery  Interventional radiology is aware  Procedures:  COLONOSCOPY Findings:      A few medium-mouthed diverticula were found in the sigmoid colon and       ascending colon. Melanotic stool throughout the colon limiting the exam       despite aggressive lavage.      Some melanotic aspirate were noted in the TI as well which could easily       be washed. The terminal ileum appeared normal.      The exam was otherwise without abnormality on direct and retroflexion       views. Impression:               - Likely small bowel source of bleeding. Beyond the                            reach of pediatric colonoscope. No active bleeding.                           - Preparation of the colon was inadequate d/t                            melanotic stool throughout the colon.                           -  Diverticulosis in the  sigmoid colon and in the                            ascending colon.                           - The examined portion of the ileum was normal.                           - The examination was otherwise normal on direct                            and retroflexion views.                           - No specimens collected. Moderate Sedation:      none      Not Applicable - Patient had care per Anesthesia. Recommendation:           - Return patient to hospital ward for ongoing care.                           - Clear liquid diet.                           - Continue present medications.                           - Trend Hb/Hct. Transfuse to keep Hb>7                           - If any further active bleeding, CTA followed by                            IR angiography. Will discuss with IR if there is                            any utility to proceed with mesenteric angiography                            even with negative CTA.                           - If continued problems, single balloon enteroscopy                            vs double-balloon enteroscopy at [facility].                           - She would likely need LAR octreotide near                            discharge.                           - The findings and recommendations were discussed  with the patient's family.   FURTHER PROCEDURES AS ABOVE   Antimicrobials:  Anti-infectives (From admission, onward)    None       Subjective: Seen and examined at bedside is doing much better.  She denies any chest pain or shortness breath.  Having some mild abdominal discomfort but is doing much better.  Had some bleeding but thinks is old blood and not as much.  Denies any other concerns or complaints at this time.  Objective: Vitals:   08/09/22 0711 08/09/22 0800 08/09/22 0900 08/09/22 1300  BP: 99/60 (!) 93/44 126/71   Pulse: 84 89 (!) 102   Resp: '15 19 15   '$ Temp:  98.2 F (36.8 C)  98.1 F (36.7 C)   TempSrc:  Oral  Oral  SpO2: 98% 93% 98%   Weight:      Height:        Intake/Output Summary (Last 24 hours) at 08/09/2022 1327 Last data filed at 08/09/2022 7673 Gross per 24 hour  Intake 1140.77 ml  Output 925 ml  Net 215.77 ml   Filed Weights   07/16/2022 1104 07/08/2022 1124 08/03/2022 1401  Weight: 88.6 kg 88.6 kg 88.6 kg   Examination: Physical Exam:  Constitutional: WN/WD obese Caucasian female currently no acute distress appears calm and more comfortable today Respiratory: Diminished to auscultation bilaterally, no wheezing, rales, rhonchi or crackles. Normal respiratory effort and patient is not tachypenic. No accessory muscle use.  Cardiovascular: Irregularly irregular, no murmurs / rubs / gallops. S1 and S2 auscultated. No extremity edema. Abdomen: Soft, slightly-tender, distended secondary body habitus. Bowel sounds positive.  GU: Deferred. Musculoskeletal: No clubbing / cyanosis of digits/nails. No joint deformity upper and lower extremities.   Skin: No rashes, lesions, ulcers on limited skin evaluation. No induration; Warm and dry.  Neurologic: CN 2-12 grossly intact with no focal deficits. Romberg sign and cerebellar reflexes not assessed.  Psychiatric: Normal judgment and insight. Alert and oriented x 3. Normal mood and appropriate affect.   Data Reviewed: I have personally reviewed following labs and imaging studies  CBC: Recent Labs  Lab 07/30/2022 0251 07/10/2022 0737 07/13/2022 1542 07/13/2022 1543 08/07/22 0300 08/07/22 0900 08/20/2022 0000 08/20/2022 0400 09/02/2022 0949 08/31/2022 2034 08/09/22 0400 08/09/22 1036  WBC 8.1  --   --   --  8.1  --  7.9 12.8*  --   --  8.3  --   NEUTROABS 4.8  --   --   --   --   --   --  10.5*  --   --  5.4  --   HGB 8.5*   < >  --    < > 9.4*  9.3*   < > 8.9* 10.1* 9.8* 9.8* 9.9* 10.6*  HCT 26.0*   < >  --    < > 29.3*  28.1*   < > 26.8* 29.8* 29.5* 29.8* 29.6* 32.4*  MCV 97.0  --   --   --  94.5  --  94.4 92.5  --   --  91.9  --    PLT 294  --  309  --  344  --  384 277  --   --  293  --    < > = values in this interval not displayed.   Basic Metabolic Panel: Recent Labs  Lab 08/05/22 0721 07/22/2022 0521 08/07/22 0300 08/26/2022 0000 08/07/2022 0400 08/09/22 0400  NA 141 138 137 136 133* 141  K 3.7 3.6 4.2 3.2* 3.8  3.8  CL 111 109 107 107 107 110  CO2 '23 22 25 23 24 28  '$ GLUCOSE 88 84 123* 154* 132* 118*  BUN '11 8 11 8 '$ 7* 10  CREATININE 0.66 0.58 0.59 0.69 0.53 0.64  CALCIUM 7.7* 8.1* 7.9* 7.8* 7.1* 7.9*  MG 1.8 1.7  --   --  1.6* 1.9  PHOS 3.2 3.0  --   --  2.8 2.7   GFR: Estimated Creatinine Clearance: 75.4 mL/min (by C-G formula based on SCr of 0.64 mg/dL). Liver Function Tests: Recent Labs  Lab 08/03/22 0108 08/05/22 0721 08/05/22 1102 08/05/2022 0521 08/25/2022 0400 08/09/22 0400  AST 14*  --  12* 11* 11* 11*  ALT 14  --  '13 10 10 5  '$ ALKPHOS 37*  --  41 37* 38 34*  BILITOT 0.6  --  0.8 0.6 0.9 0.6  PROT 5.6*  --  4.9* 4.6* 4.3* 4.1*  ALBUMIN 2.9* 2.6* 2.6* 2.3* 2.3* 2.4*   No results for input(s): "LIPASE", "AMYLASE" in the last 168 hours. No results for input(s): "AMMONIA" in the last 168 hours. Coagulation Profile: Recent Labs  Lab 07/11/2022 1542  INR 1.2   Cardiac Enzymes: No results for input(s): "CKTOTAL", "CKMB", "CKMBINDEX", "TROPONINI" in the last 168 hours. BNP (last 3 results) Recent Labs    03/24/22 1110  PROBNP 1,025*   HbA1C: No results for input(s): "HGBA1C" in the last 72 hours. CBG: Recent Labs  Lab 08/03/22 2101 07/26/2022 1816  GLUCAP 124* 107*   Lipid Profile: No results for input(s): "CHOL", "HDL", "LDLCALC", "TRIG", "CHOLHDL", "LDLDIRECT" in the last 72 hours. Thyroid Function Tests: No results for input(s): "TSH", "T4TOTAL", "FREET4", "T3FREE", "THYROIDAB" in the last 72 hours. Anemia Panel: No results for input(s): "VITAMINB12", "FOLATE", "FERRITIN", "TIBC", "IRON", "RETICCTPCT" in the last 72 hours. Sepsis Labs: No results for input(s): "PROCALCITON",  "LATICACIDVEN" in the last 168 hours.  Recent Results (from the past 240 hour(s))  MRSA Next Gen by PCR, Nasal     Status: None   Collection Time: 08/07/22 11:10 PM   Specimen: Nasal Mucosa; Nasal Swab  Result Value Ref Range Status   MRSA by PCR Next Gen NOT DETECTED NOT DETECTED Final    Comment: (NOTE) The GeneXpert MRSA Assay (FDA approved for NASAL specimens only), is one component of a comprehensive MRSA colonization surveillance program. It is not intended to diagnose MRSA infection nor to guide or monitor treatment for MRSA infections. Test performance is not FDA approved in patients less than 52 years old. Performed at [facility], [place] [address] [place], [place] [address]     Radiology Studies: CT ANGIO GI BLEED  Result Date: 08/23/2022 CLINICAL DATA:  Hematemesis.  Rectal bleeding. EXAM: CTA ABDOMEN AND PELVIS WITHOUT AND WITH CONTRAST TECHNIQUE: Multidetector CT imaging of the abdomen and pelvis was performed using the standard protocol during bolus administration of intravenous contrast. Multiplanar reconstructed images and MIPs were obtained and reviewed to evaluate the vascular anatomy. RADIATION DOSE REDUCTION: This exam was performed according to the departmental dose-optimization program which includes automated exposure control, adjustment of the mA and/or kV according to patient size and/or use of iterative reconstruction technique. CONTRAST:  157m OMNIPAQUE IOHEXOL 350 MG/ML SOLN COMPARISON:  Nuclear medicine study dated 07/08/2022 and 08/07/2022. GI bleed dated 07/11/2022. FINDINGS: VASCULAR Aorta: Moderate aortoiliac atherosclerotic disease. No aneurysmal dilatation or dissection. No periaortic fluid collection. Celiac: The origin of the celiac trunk is patent. There is common origin of the common hepatic artery  and celiac trunk. SMA: The SMA is patent. Renals: The renal arteries appear patent. IMA: The IMA is patent. Inflow: Atherosclerotic  calcification of the iliac arteries. Patent right aortofemoral bypass graft. The left iliac arteries are patent. Proximal Outflow: The visualized proximal outflow is patent. Veins: The IVC is unremarkable. The SMV, splenic vein, and main portal vein are patent. No portal venous gas. Review of the MIP images confirms the above findings. NON-VASCULAR Lower chest: Minimal left lung base atelectasis. The visualized lung bases are otherwise clear. There is coronary vascular calcification. No intra-abdominal free air or free fluid. Hepatobiliary: The liver is unremarkable. Mild biliary ductal dilatation, post cholecystectomy. No retained calcified stool noted in the central CBD. Pancreas: Unremarkable. No pancreatic ductal dilatation or surrounding inflammatory changes. Spleen: Normal in size without focal abnormality. Adrenals/Urinary Tract: The adrenal glands are unremarkable. There is no hydronephrosis or nephrolithiasis on either side. Small bilateral renal parapelvic cysts. Stomach/Bowel: There is a small hiatal hernia. High attenuating content within the distal esophagus and stomach containing small pockets of air may represent ingested matter but concerning for blood clot. There is an area of apparent nodular thickening and enhancement of the mucosa of the duodenal bulb measuring approximately 4.5 x 1.5 cm (axial 27/8). This may represent nodular folding of the mucosa or a lesion arising from the duodenal bulb. Further evaluation with endoscopy is recommended. There is loose stool throughout the colon consistent with diarrheal state. There is distal colonic diverticulosis without active inflammation. There is no bowel obstruction. No evidence of active GI bleed. The appendix is normal. Lymphatic: No adenopathy. Reproductive: The uterus is anteverted and grossly unremarkable. No adnexal mass. Other: None Musculoskeletal: Total right hip arthroplasty. Degenerative changes of the spine and L2-L3 fusion and disc  spacer. No acute osseous pathology. IMPRESSION: 1. No evidence of active GI bleed. 2. High attenuating content within the distal esophagus and stomach may represent ingested matter or blood clot. 3. Focal nodular thickening of the duodenal bulb versus a lesion or polyp. Further evaluation with endoscopy is recommended. 4. Diarrheal state. No bowel obstruction. Normal appendix. 5. Colonic diverticulosis. 6.  Aortic Atherosclerosis (ICD10-I70.0). Electronically Signed   By: Anner Crete M.D.   On: 08/18/2022 01:07   NM Bowel Img Meckels  Result Date: 08/07/2022 CLINICAL DATA:  Rectal bleeding. Negative tagged red blood cell scan 1 day prior. EXAM: NUCLEAR MEDICINE BOWEL (MECKEL'S) SCAN TECHNIQUE: Sequential abdominal images were obtained following intravenous injection of radiopharmaceutical. RADIOPHARMACEUTICALS:  10.4 mCi Tc-17mpertechnetate IV COMPARISON:  None Available. FINDINGS: Prompt physiologic binding of radiotracer within the stomach gastric mucosa. No evidence of ectopic gastric mucosa in the abdomen pelvis over 1 hour imaging. Urine activity noted the bladder. IMPRESSION: No ectopic gastric mucosa identified to suggest Meckel's diverticulum. Negative Meckel's scan Electronically Signed   By: SSuzy BouchardM.D.   On: 08/07/2022 14:08    Scheduled Meds:  sodium chloride   Intravenous Once   Chlorhexidine Gluconate Cloth  6 each Topical Daily   estrogens-methylTEST  1 tablet Oral Daily   medroxyPROGESTERone  5 mg Oral Daily   melatonin  10 mg Oral QHS   pantoprazole  40 mg Oral Daily   sodium chloride flush  10-40 mL Intracatheter Q12H   Continuous Infusions:  sodium chloride     octreotide (SANDOSTATIN) 500 mcg in sodium chloride 0.9 % 250 mL (2 mcg/mL) infusion 50 mcg/hr (08/09/22 0911)    LOS: 7 days   ORaiford Noble DO [facility] Available via Epic secure  chat 7am-7pm After these hours, please refer to coverage provider listed on [email or web address] 08/09/2022, 1:27 PM

## 2022-08-10 ENCOUNTER — Encounter (HOSPITAL_COMMUNITY): Payer: Self-pay | Admitting: Gastroenterology

## 2022-08-10 DIAGNOSIS — D62 Acute posthemorrhagic anemia: Secondary | ICD-10-CM | POA: Diagnosis not present

## 2022-08-10 DIAGNOSIS — J9600 Acute respiratory failure, unspecified whether with hypoxia or hypercapnia: Secondary | ICD-10-CM

## 2022-08-10 DIAGNOSIS — K922 Gastrointestinal hemorrhage, unspecified: Secondary | ICD-10-CM | POA: Diagnosis not present

## 2022-08-10 DIAGNOSIS — E669 Obesity, unspecified: Secondary | ICD-10-CM | POA: Diagnosis not present

## 2022-08-10 DIAGNOSIS — R195 Other fecal abnormalities: Secondary | ICD-10-CM | POA: Diagnosis not present

## 2022-08-10 LAB — BPAM FFP
Blood Product Expiration Date: 202312092359
Blood Product Expiration Date: 202312092359
Blood Product Expiration Date: 202312092359
Blood Product Expiration Date: 202312092359
Blood Product Expiration Date: 202312102359
Blood Product Expiration Date: 202312182359
ISSUE DATE / TIME: 202312040621
ISSUE DATE / TIME: 202312040621
ISSUE DATE / TIME: 202312040659
ISSUE DATE / TIME: 202312040659
Unit Type and Rh: 2800
Unit Type and Rh: 2800
Unit Type and Rh: 6200
Unit Type and Rh: 6200
Unit Type and Rh: 8400
Unit Type and Rh: 8400

## 2022-08-10 LAB — TYPE AND SCREEN
ABO/RH(D): AB POS
Antibody Screen: NEGATIVE
Unit division: 0
Unit division: 0
Unit division: 0
Unit division: 0
Unit division: 0
Unit division: 0
Unit division: 0
Unit division: 0

## 2022-08-10 LAB — BPAM RBC
Blood Product Expiration Date: 202312222359
Blood Product Expiration Date: 202312222359
Blood Product Expiration Date: 202312242359
Blood Product Expiration Date: 202312242359
Blood Product Expiration Date: 202312252359
Blood Product Expiration Date: 202312252359
Blood Product Expiration Date: 202312252359
Blood Product Expiration Date: 202312252359
ISSUE DATE / TIME: 202311300839
ISSUE DATE / TIME: 202311301011
ISSUE DATE / TIME: 202312012355
ISSUE DATE / TIME: 202312012355
ISSUE DATE / TIME: 202312021259
ISSUE DATE / TIME: 202312040613
ISSUE DATE / TIME: 202312040613
ISSUE DATE / TIME: 202312040641
Unit Type and Rh: 6200
Unit Type and Rh: 6200
Unit Type and Rh: 6200
Unit Type and Rh: 6200
Unit Type and Rh: 6200
Unit Type and Rh: 6200
Unit Type and Rh: 6200
Unit Type and Rh: 6200

## 2022-08-10 LAB — CBC WITH DIFFERENTIAL/PLATELET
Abs Immature Granulocytes: 0.09 10*3/uL — ABNORMAL HIGH (ref 0.00–0.07)
Basophils Absolute: 0.1 10*3/uL (ref 0.0–0.1)
Basophils Relative: 1 %
Eosinophils Absolute: 0.2 10*3/uL (ref 0.0–0.5)
Eosinophils Relative: 2 %
HCT: 25.1 % — ABNORMAL LOW (ref 36.0–46.0)
Hemoglobin: 8.2 g/dL — ABNORMAL LOW (ref 12.0–15.0)
Immature Granulocytes: 1 %
Lymphocytes Relative: 22 %
Lymphs Abs: 2.2 10*3/uL (ref 0.7–4.0)
MCH: 31.1 pg (ref 26.0–34.0)
MCHC: 32.7 g/dL (ref 30.0–36.0)
MCV: 95.1 fL (ref 80.0–100.0)
Monocytes Absolute: 0.6 10*3/uL (ref 0.1–1.0)
Monocytes Relative: 6 %
Neutro Abs: 7 10*3/uL (ref 1.7–7.7)
Neutrophils Relative %: 68 %
Platelets: 285 10*3/uL (ref 150–400)
RBC: 2.64 MIL/uL — ABNORMAL LOW (ref 3.87–5.11)
RDW: 17.2 % — ABNORMAL HIGH (ref 11.5–15.5)
WBC: 10.2 10*3/uL (ref 4.0–10.5)
nRBC: 0 % (ref 0.0–0.2)

## 2022-08-10 LAB — COMPREHENSIVE METABOLIC PANEL
ALT: 7 U/L (ref 0–44)
AST: 9 U/L — ABNORMAL LOW (ref 15–41)
Albumin: 2.2 g/dL — ABNORMAL LOW (ref 3.5–5.0)
Alkaline Phosphatase: 34 U/L — ABNORMAL LOW (ref 38–126)
Anion gap: 3 — ABNORMAL LOW (ref 5–15)
BUN: 17 mg/dL (ref 8–23)
CO2: 27 mmol/L (ref 22–32)
Calcium: 7.5 mg/dL — ABNORMAL LOW (ref 8.9–10.3)
Chloride: 110 mmol/L (ref 98–111)
Creatinine, Ser: 0.61 mg/dL (ref 0.44–1.00)
GFR, Estimated: >60 mL/min (ref >60)
Glucose, Bld: 120 mg/dL — ABNORMAL HIGH (ref 70–99)
Potassium: 3.9 mmol/L (ref 3.5–5.1)
Sodium: 140 mmol/L (ref 135–145)
Total Bilirubin: 0.5 mg/dL (ref 0.3–1.2)
Total Protein: 4.2 g/dL — ABNORMAL LOW (ref 6.5–8.1)

## 2022-08-10 LAB — PREPARE FRESH FROZEN PLASMA
Unit division: 0
Unit division: 0
Unit division: 0
Unit division: 0
Unit division: 0

## 2022-08-10 LAB — HEMOGLOBIN AND HEMATOCRIT, BLOOD
HCT: 22.8 % — ABNORMAL LOW (ref 36.0–46.0)
Hemoglobin: 7.1 g/dL — ABNORMAL LOW (ref 12.0–15.0)

## 2022-08-10 LAB — PHOSPHORUS: Phosphorus: 3.1 mg/dL (ref 2.5–4.6)

## 2022-08-10 LAB — GLUCOSE, CAPILLARY: Glucose-Capillary: 203 mg/dL — ABNORMAL HIGH (ref 70–99)

## 2022-08-10 LAB — PREPARE RBC (CROSSMATCH)

## 2022-08-10 LAB — MAGNESIUM: Magnesium: 1.7 mg/dL (ref 1.7–2.4)

## 2022-08-10 MED ORDER — SODIUM CHLORIDE 0.9 % IV BOLUS
500.0000 mL | Freq: Once | INTRAVENOUS | Status: AC
  Administered 2022-08-10: 500 mL via INTRAVENOUS

## 2022-08-10 MED ORDER — MIDAZOLAM HCL 2 MG/2ML IJ SOLN
INTRAMUSCULAR | Status: AC
  Filled 2022-08-10: qty 2

## 2022-08-10 MED ORDER — ROCURONIUM BROMIDE 10 MG/ML (PF) SYRINGE
PREFILLED_SYRINGE | INTRAVENOUS | Status: AC
  Filled 2022-08-10: qty 10

## 2022-08-10 MED ORDER — FENTANYL CITRATE (PF) 100 MCG/2ML IJ SOLN
INTRAMUSCULAR | Status: AC
  Filled 2022-08-10: qty 2

## 2022-08-10 MED ORDER — SUCCINYLCHOLINE CHLORIDE 200 MG/10ML IV SOSY
PREFILLED_SYRINGE | INTRAVENOUS | Status: AC
  Filled 2022-08-10: qty 10

## 2022-08-10 MED ORDER — NOREPINEPHRINE 4 MG/250ML-% IV SOLN
0.0000 ug/min | INTRAVENOUS | Status: DC
  Administered 2022-08-10: 40 ug/min via INTRAVENOUS
  Filled 2022-08-10: qty 250

## 2022-08-10 MED ORDER — KETAMINE HCL 50 MG/5ML IJ SOSY
PREFILLED_SYRINGE | INTRAMUSCULAR | Status: AC
  Filled 2022-08-10: qty 10

## 2022-08-10 MED ORDER — VASOPRESSIN 20 UNITS/100 ML INFUSION FOR SHOCK
INTRAVENOUS | Status: AC
  Filled 2022-08-10: qty 100

## 2022-08-10 MED ORDER — NOREPINEPHRINE 4 MG/250ML-% IV SOLN
INTRAVENOUS | Status: AC
  Administered 2022-08-10: 2 ug/min via INTRAVENOUS
  Filled 2022-08-10: qty 250

## 2022-08-10 MED ORDER — FENTANYL CITRATE PF 50 MCG/ML IJ SOSY
PREFILLED_SYRINGE | INTRAMUSCULAR | Status: AC
  Filled 2022-08-10: qty 2

## 2022-08-10 MED ORDER — ETOMIDATE 2 MG/ML IV SOLN
INTRAVENOUS | Status: AC
  Filled 2022-08-10: qty 20

## 2022-08-10 MED ORDER — SODIUM BICARBONATE 8.4 % IV SOLN
INTRAVENOUS | Status: AC
  Filled 2022-08-10: qty 50

## 2022-08-10 MED ORDER — SODIUM CHLORIDE 0.9% IV SOLUTION: Freq: Once | INTRAVENOUS | Status: DC

## 2022-08-10 MED ORDER — CALCIUM GLUCONATE-NACL 1-0.675 GM/50ML-% IV SOLN
INTRAVENOUS | Status: AC
  Filled 2022-08-10: qty 50

## 2022-08-10 MED ORDER — VASOPRESSIN 20 UNIT/ML IV SOLN
0.0000 [IU]/min | INTRAVENOUS | Status: DC
  Administered 2022-08-10: 0.2 [IU]/min via INTRAVENOUS
  Filled 2022-08-10 (×2): qty 2.5

## 2022-08-11 LAB — BPAM RBC
Blood Product Expiration Date: 202312272359
Blood Product Expiration Date: 202312272359
Blood Product Expiration Date: 202312272359
Blood Product Expiration Date: 202312272359
Blood Product Expiration Date: 202312272359
Blood Product Expiration Date: 202401012359
Blood Product Expiration Date: 202401072359
Blood Product Expiration Date: 202401072359
Blood Product Expiration Date: 202401072359
Blood Product Expiration Date: 202401072359
ISSUE DATE / TIME: 202312040604
ISSUE DATE / TIME: 202312040604
ISSUE DATE / TIME: 202312040604
ISSUE DATE / TIME: 202312040604
ISSUE DATE / TIME: 202312040604
ISSUE DATE / TIME: 202312042302
ISSUE DATE / TIME: 202312042310
ISSUE DATE / TIME: 202312050753
ISSUE DATE / TIME: 202312050753
ISSUE DATE / TIME: 202312051127
Unit Type and Rh: 6200
Unit Type and Rh: 6200
Unit Type and Rh: 6200
Unit Type and Rh: 6200
Unit Type and Rh: 6200
Unit Type and Rh: 9500
Unit Type and Rh: 9500
Unit Type and Rh: 9500
Unit Type and Rh: 9500
Unit Type and Rh: 9500

## 2022-08-11 LAB — TYPE AND SCREEN
ABO/RH(D): AB POS
Antibody Screen: NEGATIVE
Unit division: 0
Unit division: 0
Unit division: 0
Unit division: 0
Unit division: 0
Unit division: 0
Unit division: 0
Unit division: 0
Unit division: 0
Unit division: 0

## 2022-08-13 ENCOUNTER — Telehealth: Payer: Self-pay | Admitting: Internal Medicine

## 2022-08-13 NOTE — Telephone Encounter (Signed)
Left message for [name] to call back.

## 2022-08-13 NOTE — Telephone Encounter (Signed)
Patient's husband is calling states that Dr Lyndel Safe and Dr Henrene Pastor said that we could give him a note for their daughter being out of school due to patient passing. Please advise

## 2022-08-13 NOTE — Telephone Encounter (Signed)
Husband states they have custody of their granddaughter and need a letter excusing her from being absent from school 11/20-12/11 as her guardian was sick and passed away. Letter prepared and left up front for him to pick up.

## 2022-09-07 NOTE — Progress Notes (Signed)
0442 Levo titrated outside of order and increased to 40 mcg/min with Gershon Cull, NP at bedside with verbal order. Order for 500 mL bolus to be placed by Olena Heckle, NP  [phone] Patient's husband notified of change in status. Husband en route. Patient HR sustained in 130s-170s. Patient drowsy, but responds to voice. Patient diaphoretic and skin pale. Pt reports improvement in nausea after compazine. CCM Physician contacted previously and en route. See flowsheets for vitals. See MAR for infusion rates. ZOLL pads applied to patient  0529 Vasopressin started per order due to consistent hypotension.   0530 Patient states "I'm feeling like I'm going out" Patient able to answer questions, but when eyes are opened, pt stares off blankly. Blood bank called and notified this RN that blood is not ready, but will call when it is available.   0545 500 mL bolus ordered for hypotension. Patient states "it keeps on coming out" referring to bloody bowel movement. Patient has a very large bloody bowel movement that continues to flow out and down the bed. Olena Heckle, NP at bedside to visualize bowel movement.  0607 Patient HR went from 170s to 70s and patient became unresponsive with agonal respirations. Femoral pulse palpable at this time, Code alarm pulled. Patient being bagged by respiratory while Ruthann Cancer, DO prepares for emergent intubation.   [place], DO at bedside and increased levophed infusion to 80 mcg/min. Femoral pulses no longer palpable and patient in PEA. Compressions started. Verbal order to start epinephrine gtt at 10 mcg/min. Epinephrine gtt started.  0623 Blood available at bedside and transfusions began.  0630 Family in waiting room and provider giving update on patient status.   0656 Code called by Audria Nine, DO. Patient in asystole with no pulses or respirations.

## 2022-09-07 NOTE — ED Provider Notes (Signed)
Called to bedside for CODE BLUE. Code run per ACLS protocol by ICU team.  Assisted during resuscitation.  Patient expired despite aggressive measures.  Husband notified that patient expired.   Quintella Reichert, MD 2022-08-27 [phone]

## 2022-09-07 NOTE — Procedures (Signed)
Intubation Procedure Note  CLARIS PECH  [id number]  December 25, 1960  Date:09/06/2022  Time:7:20 AM   Provider Performing:Taliya Mcclard Ruthann Cancer    Procedure: Intubation (38377)  Indication(s) Respiratory Failure  Consent Unable to obtain consent due to emergent nature of procedure.   Anesthesia None, unresponsive   Time Out Verified patient identification, verified procedure, site/side was marked, verified correct patient position, special equipment/implants available, medications/allergies/relevant history reviewed, required imaging and test results available.   Sterile Technique Usual hand hygeine, masks, and gloves were used   Procedure Description Patient positioned in bed supine.  Sedation given as noted above.  Patient was intubated with endotracheal tube using Glidescope.  View was Grade 1 full glottis .  Number of attempts was 1.  Colorimetric CO2 detector was consistent with tracheal placement.   Complications/Tolerance None, pt unresponsive and peri code Chest X-ray is ordered to verify placement.   EBL Large but 2/2 gi bleed, not ett placement. Large volume blood from esophagus    Specimen(s) None

## 2022-09-07 NOTE — Death Summary Note (Incomplete)
DEATH SUMMARY   Patient Details  Name: [name] MRN: [id number] DOB: 1960-11-21 FIE:PPIRJ, Purcell Nails, MD Admission/Discharge Information   Admit Date:  08/20/2022  Date of Death: Date of Death: 2022/08/28  Time of Death: Time of Death: 0656  Length of Stay: 8   Principle Cause of death: Acute cardiopulmonary arrest in the setting of GI hemorrhage and blood loss  Hospital Diagnoses: Principal Problem:   Acute GI bleeding Active Problems:   Osteoarthritis   GERD (gastroesophageal reflux disease)   Spinal stenosis   Hematemesis with nausea   Paroxysmal atrial fibrillation (HCC)   Class II obesity   Acute blood loss anemia   Dark stools  Hospital Course: The Patient is an obese 62 year old Caucasian female with a past medical history significant for but not limited to proximal atrial fibrillation not on anticoagulation, history of iron deficiency anemia, osteoarthritis, GERD, history of GI bleed and duodenal AVM/telangiectasia presented with multiple episodes of hematemesis and rectal bleed and was not admitted due to GI bleeding.  Vitals were stable except for mild tachycardia and her presenting hemoglobin was 10.9 and it was 10.6 on the day that she was discharged on 07/30/2019 when she was discharged from the hospital.  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  Capsule endoscopy was done without active bleeding but she had 1 small nonbleeding AVM in the proximal small bowel.  She underwent a colonoscopy which felt that the small bowel was likely the source of her bleeding and preparation of the colon was inadequate due to melanotic stool throughout the colon.  She was found to have diverticulosis in the sigmoid colon and in the ascending colon examined portion ileum was normal.   Later in the day yesterday after her  colonoscopy she had an active rectal bleed and had an episode of loss of consciousness blood pressure dropped and heart rate was 78.  She is transferred to the stepdown unit and a stat CT angio was done and was negative for any extravasation to suggest active bleeding.  And given 500 mL bolus and given 2 units of PRBCs.  GI was made aware of the situation and subsequently she is improved this morning with a hemoglobin being 9.4.  Her blood pressures remain the softer side but she feels okay.  The current plan was to try and get her transferred to a tertiary care center at [facility] for a double-balloon enteroscopy however the GI new physicians declined and taking the patient stating extreme limited availability for double-balloon enteroscopy.  GI has now updated the plan to proceed with a Meckel scan in the morning and recommending continue aggressive supportive measures as well as transfusing as indicated.  GI is also considering a trial of octreotide if initiated then no need to plan to continue outpatient if there is no definitive source.  If the patient rebleeds prior to Meckel scan in the morning they are agreeable to proceed with a stat nuclear medicine bleeding scan.   **07/31/2022 had another syncopal episode this morning and had hematemesis and rectal bleeding.  She was given a 500 mL bolus and was typed and screened transfuse 2 more units of PRBCs.  GI was urgently consulted and her Meckel scan was discontinued and she was taken for a nuclear medicine bleeding scan.  Given her hypotension the PCCM team was consulted and  they placed an arterial line for better blood pressure evaluation and also a PICC line was placed.  Further care pending the results of the nuclear medicine bleeding scan.   08/07/22: The nuclear medicine bleeding scan was negative and she stabilized.  She had a little bit of bleeding this morning with some cramping but no further syncope or lightheadedness.  She was less lethargic  and was much more awake and alert and felt like her cell.  Tolerated her clinical diet and is going for Meckel scan today.  Surgery evaluated and felt no surgical intervention at this time and given that her hemoglobin is overall stable and not requiring pressors they are going to continue to monitor and follow along.  Critical care following as well as recommending transfusion for hemoglobin less than 7 with hypotension and active bleeding.  Will continue PPI and octreotide for now   08/09/2022: Early morning she had hematemesis and rectal bleeding again and had to have another stat angio CT GI bleeding scan.  Systolic blood pressures in the 70s to 90s and she was pale and hypotensive again.  She is given Compazine, 1 L normal saline bolus and was ordered for 2 units of blood.  Repeat hemoglobin was 8.9 and given her hematemesis and rectal bleeding GI was paged and given her repeat GI bleeding scan with no active bleeding GI is taking her for repeat endoscopy today.  She had a negative Meckel scan and general surgery is following as well.  We are attempting to try to transfer her to a tertiary care center that does double-balloon enteroscopy's.   Patient underwent repeat EGD and she was found to have an oozing cratered and superficial duodenal ulcer with oozing hemorrhage and was found in the third portion of the duodenum being 5 mm in largest diameter and was successfully injected and hemostasis was achieved with 3 hemostatic clips.  She was brought back to the unit and placed on a clear liquid diet and now diet has been advanced to a regular diet.  She is improving and continues to have some bloody stools but could be old blood.    Assessment and Plan:  Acute GI Bleed/Reactive Gastropathy with history of small bowel AVMs -Presents with hematemesis and rectal bleed.  H&H at baseline.  Not on blood thinners or NSAID.  -Hemodynamically stable now I had no further evidence of syncope-EGD and capsule endoscopy  have been done.   -She had iron infusion previous hospitalization. -Given her syncopal episodes she was typed and screened and transfused 6 units of PRBCs and had a repeat emergent CTA which was negative for any extravasation to suggest active bleed -Monitor H&H every 6 hours -Continue IV Protonix -C/w CLD -Colonoscopy done and preparation of the colon was inadequate due to melanotic stool throughout the colon but there is diverticulosis in the sigmoid colon and the ascending.  The examined portion of the ileum was normal and examination was otherwise normal on direct and retroflexion views.  GI felt that the likely source of her bleeding was small bowel and was beyond the reach of the pediatric colonoscope. -Patient's hemoglobin/hematocrit after 7 units has now improved and has relatively stabilized and has gone from 7.1/22.7 -> 9.4/29.2 -> 9.5/29.3 -> 9.1/27.6 -> 9.2/27.7 -> 8.5/26.0 -> 9.5/29.3 -> 10.8/32.1 -> 9.6/28.9 -> 9.3/28.1 -> 9.8/29.8 -> 9.5/28.8 -> 9.6/28.4 -> 8.9/26.8 -> 10.1/29.8 -> 9.8/29.5 -> 9.8/29.5 -> 9.9/29.6 -> 10.6/32.4 -Verbally consented for blood transfusion if indicated -Follow further GI recommendation and given  that she had a syncopal episode with 600 mL of bright red blood yesterday GI recommended attempting to get her transferred to [facility] for double-balloon enteroscopy however the GI physicians at [facility] declined to take the transfer stating extreme limited availability for the double-balloon enteroscopy. -GI was recommending proceeding with a Meckel scan this morning and continue aggressive supportive measures as well as transfusing as indicated however she decompensated further and had a vasovagal episode and had more hematemesis and rectal bleeding with clots.   -GI canceled the Meckel scan and opted for a stat nuclear medicine bleeding scan and she was typed and screened and transfused 2 units PRBCs initiated on octreotide as well as given a dose of DDAVP and a 500 mL bolus;  GI recommending continuing to check H&H as posttransfusion and every 6 for 24 hours and have initiated octreotide at 50 mcg/h infusion after a 50 mcg bolus.  They are recommending continuing estrogen and progesterone -The tagged RBC scan was delayed due to her being hemodynamically stable in the setting of recurrent large-volume bleeding with large clots from her rectum.  After she is stabilized she was taken for the tagged RBC and the GI team discussed with IR who reviewed the case and if her tagged RBC scan is positive she will need a stat CTA to x-ray target the area of bleeding required for fluoroscopic angiography -Further care pending nuclear medicine's and bleeding scan results per GI and she may get a repeat enteroscopy but her nuclear medicine bleeding scan was negative and repeat enteroscopy was referred -Given her tenuous blood pressures yesterday PCCM was consulted and they placed an arterial line and recommended a PICC line and the critical care physician is recommending a balanced transfusion strategy avoiding hypothermia, acidemia coagulopathy and recommending using Levophed as needed -Underwent Meckel scan yesterday and was negative -Had another episode overnight and underwent a stat CT scan of the abdomen pelvis which was negative -DIC panel done and showed a D-dimer of 2.25, fibrinogen of 478, PT of 15.3 INR 1.2 and a PTT of 27 with no schistocytes seen on smear -Given her bleeding General surgery was consulted on standby but currently does not need any emergent surgery and is not requiring pressors  -Gastroenterology is planning repeat EGD and found a 3 cm hiatal hernia, normal stomach: Oozing duodenal ulcer with oozing hemorrhage was injected in had 3 MR clips placed. -She was placed on a clear liquid diet after EGD is now on a soft diet and doing well and feeling better.;  If bleeding reoccurs IR embolization at the Hemoclip site is needed to rest the bleeding then   Paroxysmal  A-fib -In A-fib but rate controlled again.  Not on anticoagulation due to GI bleed and low CHADS2 score. -Cardizem 30 mg every 8 hours as needed for now -Resume home Cardizem CD once stable from bleeding standpoint but continue to hold given that she is hypotensive -Optimize electrolytes and continue monitor in the stepdown unit   Hyponatremia -Patient's Na+ went from 137 -> 136 -> 133 -> 141 -Continue to Monitor and Trend    Hypomagnesemia -Patient's mag level was 1.6 and improved to 1.9 -Replete with IV mag sulfate 2 g yesterday  -Continue to monitor and replete as necessary -Repeat CMP in a.m.   Leukocytosis -Likely reactive this patient's WBC is gone from 7.9 -> 12.8 -> 8.3 -Continue to monitor and trend and repeat CMP in a.m.   Recurrent Vasovagal syncope -Patient had an episode of syncope while  sitting on toilet previously and again had another Syncopal episode on the evening of 08/05/27 No fall or trauma. -She had another Syncopal episode 07/28/2022 she had another large bloody bowel movement -Continue SDU telemetry Monitoring -The repeat syncopal episode on 11/28 happened after she had a large bloody bowel movement approximately 600 mL of cranberry liquid stool.  Blood pressure at that time was 78/42 and she had to be placed in Trendelenburg.  She had a stat CTA of the abdomen and was transferred to the stepdown unit given 500 mL bolus -CTA done and showed "VASCULAR   No definite evidence of active gastrointestinal bleeding.   Atherosclerosis of abdominal aorta is noted without aneurysm or dissection. Patent right aortofemoral bypass graft is noted.   NON-VASCULAR   Low density is seen involving the inferior pole of the left kidney concerning for renal infarction or possibly pyelonephritis. This is new since prior exam." -She is given another 500 mL bolus on 08/05/2022 and transfused 2 units of PRBCs and taken for a stat nuclear medicine bleeding scan after an arterial line  and a PICC line was placed.  Nuclear medicine bleeding scan was negative and she is undergoing a Meckel scan and this was negative and showed no ectopic gastric mucosa identified to suggest Meckel's diverticulum -Given her episode again overnight she was typed and screened and transfused 2 more units and underwent another stat CTA GI bleed and the scan showed no lesion identified in the duodenum at the site of vascular clips in the third portion of the duodenum with no small bowel lesion and mild left colonic diverticulosis and a left lower lobe pneumonia noted. -Repeat Orthostatic VS in the AM as site of bleeding likely addressed    Osteoarthritis/spinal stenosis/low back pain -Stable -C/w Tylenol as needed -Continue home tramadol   Hypoalbuminemia -The patient's albumin level is now gone from 2.6 is now 2.3 x2 -> 2.4 -Continue to Monitor and Trend and Repeat CMP in the AM    Obesity -Complicates overall prognosis and care -Estimated body mass index is 35.73 kg/m as calculated from the following:   Height as of this encounter: '5\' 2"'$  (1.575 m).   Weight as of this encounter: 88.6 kg.  -Weight Loss and Dietary Counseling given   Procedures:  EGD 08/15/2022 Findings:      A 3 cm hiatal hernia was present.      The stomach was normal.      One oozing cratered and superficial duodenal ulcer with oozing       hemorrhage (Forrest Class Ib) was found in the third portion of the       duodenum. The lesion was 5 mm in largest dimension. Area was       successfully injected with 4 mL of a 0.1 mg/mL solution of epinephrine       for drug delivery. For hemostasis, three hemostatic clips were       successfully placed (MR safe). Clip manufacturer: [employer].       There was no bleeding at the end of the procedure.      Starting with the pediatric colonoscope, for the enteroscopy, there was       no evidence of any blood in the upper GI tract and proximal small bowel.       The  colonoscope was advanced to the site of the prior tattoo. During the       intubation process the prior hemoclip was identified and passed,  however, upon withdraw of the colonoscope bleeding was identified. There       was evidence of fresh arterial bleeding. The bleeding was localized to a       site obscured by the hemoclip. Washing revealed rapid oozing of arterial       blood from this area. The duodenoscope was then employed. Because of       gastric looping, positioning with the duodenoscope was difficult, but it       did allow a good visualization of the lesion. This appeared to be a       small ulceration with a vessel. The exact vessel was not clearly       identified, but there was evidence of the ulcer. Four ml of 1:10,000 Epi       was injected around the area. With the severe angulation and looping of       the duodenoscope, the elevator was not able to properly lift the needle       into position. Only tangential injections were possible, but it was       adequate. Using a standard adult endoscope the site was identified. Two       mantis clips were deployed as well as a third ultraclip. This       effectively sealed the mucosal defect. No bleeding was induced when       hemostasis was being applied. Impression:               - 3 cm hiatal hernia.                           - Normal stomach.                           - Oozing duodenal ulcer with oozing hemorrhage                            (Forrest Class Ib). Injected. Clip manufacturer:                            [employer]. Clips (MR safe) were placed.                           - No specimens collected.   COLONOSCOPY Findings:      A few medium-mouthed diverticula were found in the sigmoid colon and       ascending colon. Melanotic stool throughout the colon limiting the exam       despite aggressive lavage.      Some melanotic aspirate were noted in the TI as well which could easily       be washed. The  terminal ileum appeared normal.      The exam was otherwise without abnormality on direct and retroflexion       views. Impression:               - Likely small bowel source of bleeding. Beyond the                            reach of pediatric colonoscope. No active bleeding.                           -  Preparation of the colon was inadequate d/t                            melanotic stool throughout the colon.                           - Diverticulosis in the sigmoid colon and in the                            ascending colon.                           - The examined portion of the ileum was normal.                           - The examination was otherwise normal on direct                            and retroflexion views.                           - No specimens collected. Moderate Sedation:      none      Not Applicable - Patient had care per Anesthesia. Recommendation:           - Return patient to hospital ward for ongoing care.                           - Clear liquid diet.                           - Continue present medications.                           - Trend Hb/Hct. Transfuse to keep Hb>7                           - If any further active bleeding, CTA followed by                            IR angiography. Will discuss with IR if there is                            any utility to proceed with mesenteric angiography                            even with negative CTA.                           - If continued problems, single balloon enteroscopy                            vs double-balloon enteroscopy at [facility].                           - She would likely need LAR octreotide  near                            discharge.                           - The findings and recommendations were discussed                            with the patient's family.  Consultations: Gastroenterology, PCCM/Pulmonary General Surgery, IR was aware   The results of significant diagnostics from this  hospitalization (including imaging, microbiology, ancillary and laboratory) are listed below for reference.   Significant Diagnostic Studies: CT ANGIO GI BLEED  Result Date: 09/02/2022 CLINICAL DATA:  Hematemesis.  Rectal bleeding. EXAM: CTA ABDOMEN AND PELVIS WITHOUT AND WITH CONTRAST TECHNIQUE: Multidetector CT imaging of the abdomen and pelvis was performed using the standard protocol during bolus administration of intravenous contrast. Multiplanar reconstructed images and MIPs were obtained and reviewed to evaluate the vascular anatomy. RADIATION DOSE REDUCTION: This exam was performed according to the departmental dose-optimization program which includes automated exposure control, adjustment of the mA and/or kV according to patient size and/or use of iterative reconstruction technique. CONTRAST:  19m OMNIPAQUE IOHEXOL 350 MG/ML SOLN COMPARISON:  Nuclear medicine study dated 07/26/2022 and 08/07/2022. GI bleed dated 07/28/2022. FINDINGS: VASCULAR Aorta: Moderate aortoiliac atherosclerotic disease. No aneurysmal dilatation or dissection. No periaortic fluid collection. Celiac: The origin of the celiac trunk is patent. There is common origin of the common hepatic artery and celiac trunk. SMA: The SMA is patent. Renals: The renal arteries appear patent. IMA: The IMA is patent. Inflow: Atherosclerotic calcification of the iliac arteries. Patent right aortofemoral bypass graft. The left iliac arteries are patent. Proximal Outflow: The visualized proximal outflow is patent. Veins: The IVC is unremarkable. The SMV, splenic vein, and main portal vein are patent. No portal venous gas. Review of the MIP images confirms the above findings. NON-VASCULAR Lower chest: Minimal left lung base atelectasis. The visualized lung bases are otherwise clear. There is coronary vascular calcification. No intra-abdominal free air or free fluid. Hepatobiliary: The liver is unremarkable. Mild biliary ductal dilatation, post  cholecystectomy. No retained calcified stool noted in the central CBD. Pancreas: Unremarkable. No pancreatic ductal dilatation or surrounding inflammatory changes. Spleen: Normal in size without focal abnormality. Adrenals/Urinary Tract: The adrenal glands are unremarkable. There is no hydronephrosis or nephrolithiasis on either side. Small bilateral renal parapelvic cysts. Stomach/Bowel: There is a small hiatal hernia. High attenuating content within the distal esophagus and stomach containing small pockets of air may represent ingested matter but concerning for blood clot. There is an area of apparent nodular thickening and enhancement of the mucosa of the duodenal bulb measuring approximately 4.5 x 1.5 cm (axial 27/8). This may represent nodular folding of the mucosa or a lesion arising from the duodenal bulb. Further evaluation with endoscopy is recommended. There is loose stool throughout the colon consistent with diarrheal state. There is distal colonic diverticulosis without active inflammation. There is no bowel obstruction. No evidence of active GI bleed. The appendix is normal. Lymphatic: No adenopathy. Reproductive: The uterus is anteverted and grossly unremarkable. No adnexal mass. Other: None Musculoskeletal: Total right hip arthroplasty. Degenerative changes of the spine and L2-L3 fusion and disc spacer. No acute osseous pathology. IMPRESSION: 1. No evidence of active GI bleed. 2. High attenuating content within the distal esophagus and  stomach may represent ingested matter or blood clot. 3. Focal nodular thickening of the duodenal bulb versus a lesion or polyp. Further evaluation with endoscopy is recommended. 4. Diarrheal state. No bowel obstruction. Normal appendix. 5. Colonic diverticulosis. 6.  Aortic Atherosclerosis (ICD10-I70.0). Electronically Signed   By: Anner Crete M.D.   On: 08/23/2022 01:07   NM Bowel Img Meckels  Result Date: 08/07/2022 CLINICAL DATA:  Rectal bleeding. Negative  tagged red blood cell scan 1 day prior. EXAM: NUCLEAR MEDICINE BOWEL (MECKEL'S) SCAN TECHNIQUE: Sequential abdominal images were obtained following intravenous injection of radiopharmaceutical. RADIOPHARMACEUTICALS:  10.4 mCi Tc-75mpertechnetate IV COMPARISON:  None Available. FINDINGS: Prompt physiologic binding of radiotracer within the stomach gastric mucosa. No evidence of ectopic gastric mucosa in the abdomen pelvis over 1 hour imaging. Urine activity noted the bladder. IMPRESSION: No ectopic gastric mucosa identified to suggest Meckel's diverticulum. Negative Meckel's scan Electronically Signed   By: SSuzy BouchardM.D.   On: 08/07/2022 14:08   NM GI Blood Loss  Result Date: 08/01/2022 CLINICAL DATA:  Bright red blood per rectum. Evaluate for active gastrointestinal bleeding. EXAM: NUCLEAR MEDICINE GASTROINTESTINAL BLEEDING SCAN TECHNIQUE: Sequential abdominal images were obtained following intravenous administration of Tc-932mabeled red blood cells. RADIOPHARMACEUTICALS:  20.3 mCi Tc-99107mrtechnetate in-vitro labeled red cells. COMPARISON:  CT angiogram 07/24/2022 FINDINGS: 2 hours of planar imaging were performed. No accumulation of tagged red blood cells within the small bowel or colon. Physiologic activity noted within the blood pool and solid organs. IMPRESSION: No evidence of active gastrointestinal bleeding over 2 hours imaging. Electronically Signed   By: SteSuzy BouchardD.   On: 07/14/2022 14:38   DG CHEST PORT 1 VIEW  Result Date: 07/29/2022 CLINICAL DATA:  [id number] Encounter for central line placement [id number]: PORTABLE CHEST 1 VIEW COMPARISON:  07/12/2022 chest radiograph. FINDINGS: Right PICC terminates over the cavoatrial junction. Partially visualized surgical hardware from ACDF. Spinal cerclage wire overlies the upper thoracic spine. Stable cardiomediastinal silhouette with top-normal heart size. No pneumothorax. No pleural effusion. Lungs appear clear, with no acute  consolidative airspace disease and no pulmonary edema. IMPRESSION: Right PICC terminates over the cavoatrial junction. No pneumothorax. No active cardiopulmonary disease. Electronically Signed   By: JasIlona SorrelD.   On: 07/17/2022 11:49   [place] SITE RITE  Result Date: 07/22/2022 If Site Rite image not attached, placement could not be confirmed due to current cardiac rhythm.  CT ANGIO GI BLEED  Result Date: 07/24/2022 CLINICAL DATA:  Gastrointestinal bleeding.  Colonoscopy today. EXAM: CTA ABDOMEN AND PELVIS WITHOUT AND WITH CONTRAST TECHNIQUE: Multidetector CT imaging of the abdomen and pelvis was performed using the standard protocol during bolus administration of intravenous contrast. Multiplanar reconstructed images and MIPs were obtained and reviewed to evaluate the vascular anatomy. RADIATION DOSE REDUCTION: This exam was performed according to the departmental dose-optimization program which includes automated exposure control, adjustment of the mA and/or kV according to patient size and/or use of iterative reconstruction technique. CONTRAST:  100m1mNIPAQUE IOHEXOL 350 MG/ML SOLN COMPARISON:  August 02, 2022. FINDINGS: VASCULAR Aorta: Atherosclerosis of abdominal aorta is noted without aneurysm or dissection. Celiac: Patent without evidence of aneurysm, dissection, vasculitis or significant stenosis. SMA: Patent without evidence of aneurysm, dissection, vasculitis or significant stenosis. Renals: Both renal arteries are patent without evidence of aneurysm, dissection, vasculitis, fibromuscular dysplasia or significant stenosis. IMA: Patent without evidence of aneurysm, dissection, vasculitis or significant stenosis. Inflow: Status post right aortofemoral bypass graft which is widely patent. Left iliac arteries  are widely patent. Proximal Outflow: Bilateral common femoral and visualized portions of the superficial and profunda femoral arteries are patent without evidence of aneurysm,  dissection, vasculitis or significant stenosis. Veins: No obvious venous abnormality within the limitations of this arterial phase study. Review of the MIP images confirms the above findings. NON-VASCULAR Lower chest: Minimal scarring is seen involving the visualized lung bases. Moderate size sliding-type hiatal hernia is noted. Hepatobiliary: No focal liver abnormality is seen. Status post cholecystectomy. No biliary dilatation. Pancreas: Unremarkable. No pancreatic ductal dilatation or surrounding inflammatory changes. Spleen: Normal in size without focal abnormality. Adrenals/Urinary Tract: Adrenal glands appear normal. No hydronephrosis or renal obstruction is noted. Urinary bladder is unremarkable. Low density is seen involving the inferior pole of the left kidney concerning for renal infarction or pyelonephritis. Stomach/Bowel: There is no evidence of bowel obstruction or inflammation. There is no definite evidence of active gastrointestinal bleeding. Stool is noted in the right colon. Lymphatic: No adenopathy is noted. Reproductive: Uterus and bilateral adnexa are unremarkable. Other: No abdominal wall hernia or abnormality. No abdominopelvic ascites. Musculoskeletal: Status post right hip arthroplasty. No acute osseous abnormality is noted. IMPRESSION: VASCULAR No definite evidence of active gastrointestinal bleeding. Atherosclerosis of abdominal aorta is noted without aneurysm or dissection. Patent right aortofemoral bypass graft is noted. NON-VASCULAR Low density is seen involving the inferior pole of the left kidney concerning for renal infarction or possibly pyelonephritis. This is new since prior exam. Electronically Signed   By: Marijo Conception M.D.   On: 08/05/2022 19:35   CT ANGIO GI BLEED  Result Date: 07/19/2022 CLINICAL DATA:  Rectal bleeding EXAM: CTA ABDOMEN AND PELVIS WITHOUT AND WITH CONTRAST TECHNIQUE: Multidetector CT imaging of the abdomen and pelvis was performed using the standard  protocol during bolus administration of intravenous contrast. Multiplanar reconstructed images and MIPs were obtained and reviewed to evaluate the vascular anatomy. RADIATION DOSE REDUCTION: This exam was performed according to the departmental dose-optimization program which includes automated exposure control, adjustment of the mA and/or kV according to patient size and/or use of iterative reconstruction technique. CONTRAST:  149m OMNIPAQUE IOHEXOL 350 MG/ML SOLN COMPARISON:  None Available. FINDINGS: VASCULAR No evidence of active GI bleed. NON-VASCULAR Lower chest: Near-complete resolution of left lower lobe consolidation. Small hiatal hernia. Cardiomegaly. Hepatobiliary: No focal liver abnormality is seen. Status post cholecystectomy. No biliary dilatation. Pancreas: Unremarkable. No pancreatic ductal dilatation or surrounding inflammatory changes. Spleen: Normal in size without focal abnormality. Adrenals/Urinary Tract: Bilateral adrenal glands are unremarkable. Small bilateral parapelvic cysts. Bladder is unremarkable. Stomach/Bowel: Stomach is within normal limits. Mild diverticulosis no evidence of bowel wall thickening, distention, or inflammatory changes. Lymphatic: Aortic atherosclerosis. Patent right aortoiliac bypass graft. No enlarged abdominal or pelvic lymph nodes. Reproductive: Uterus and bilateral adnexa are unremarkable. Other: No abdominal wall hernia or abnormality. No abdominopelvic ascites. Musculoskeletal: Prior right hip replacement and lumbar spine fusion. No aggressive appearing osseous lesions IMPRESSION: 1. No evidence of active GI bleed. 2. No acute abnormalities in the abdomen or pelvis. 3. Near-complete resolution of left lower lobe consolidation, compatible with resolving pneumonia Electronically Signed   By: LYetta GlassmanM.D.   On: 07/17/2022 19:45   DG Chest 2 View  Result Date: 07/26/2022 CLINICAL DATA:  Aspiration. EXAM: CHEST - 2 VIEW COMPARISON:  July 29, 2022.  FINDINGS: Stable cardiomediastinal silhouette. Right lung is clear. Significantly improved left lung opacity is noted. Bony thorax is unremarkable. IMPRESSION: Significantly improved left lung opacity. Electronically Signed   By: JSabino Dick  Jr M.D.   On: 08/05/2022 09:32   DG Chest 2 View  Result Date: 07/29/2022 CLINICAL DATA:  Hypoxemia. EXAM: CHEST - 2 VIEW COMPARISON:  07/28/2022 FINDINGS: The cardio pericardial silhouette is enlarged. Patchy airspace disease in the parahilar left lung is new in the interval. Right lung clear. No evidence for pleural effusion. The visualized bony structures of the thorax are unremarkable. Telemetry leads overlie the chest. IMPRESSION: Patchy airspace disease in the parahilar left lung compatible with pneumonia. Follow-up recommended to ensure resolution. Electronically Signed   By: Misty Stanley M.D.   On: 07/29/2022 12:54   CT ABDOMEN PELVIS W CONTRAST  Result Date: 07/29/2022 CLINICAL DATA:  Recent upper endoscopy demonstrated potential sub epithelial lesion in the distal duodenum. EXAM: CT ABDOMEN AND PELVIS WITH CONTRAST TECHNIQUE: Multidetector CT imaging of the abdomen and pelvis was performed using the standard protocol following bolus administration of intravenous contrast. RADIATION DOSE REDUCTION: This exam was performed according to the departmental dose-optimization program which includes automated exposure control, adjustment of the mA and/or kV according to patient size and/or use of iterative reconstruction technique. CONTRAST:  164m OMNIPAQUE IOHEXOL 300 MG/ML  SOLN COMPARISON:  None Available. FINDINGS: Lower chest: Dense consolidation in the medial LEFT lower lobe. Hepatobiliary: No focal hepatic lesion. Postcholecystectomy. No biliary dilatation. Pancreas: Pancreas is normal. No ductal dilatation. No pancreatic inflammation. Spleen: Normal spleen Adrenals/urinary tract: Adrenal glands and kidneys are normal. The ureters and bladder normal.  Stomach/Bowel: Vascular clips noted in the third portion the duodenum (image 39/2). No associated small-bowel lesion. Small bowel is normal. Cecum normal. Several diverticula of the LEFT colon without acute inflammation. Vascular/Lymphatic: Abdominal aorta is normal caliber with atherosclerotic calcification. There is no retroperitoneal or periportal lymphadenopathy. No pelvic lymphadenopathy. Repair of the aorta. Reproductive: Uterus and adnexa unremarkable. Other: Is Musculoskeletal: No aggressive osseous lesion. IMPRESSION: 1. No lesion identified in the duodenum at the site the vascular clips (third portion duodenum). 2. No small-bowel lesion. 3. Mild LEFT colon diverticulosis. 4. LEFT lower lobe PNEUMONIA. Electronically Signed   By: SSuzy BouchardM.D.   On: 07/29/2022 12:52   DG Abd Portable 1V  Result Date: 07/28/2022 CLINICAL DATA:  Duodenal nodule. Clips were placed to identify area of possible recent bleeding. EXAM: PORTABLE ABDOMEN - 1 VIEW COMPARISON:  Abdominal radiograph 05/06/2022 FINDINGS: There is a clip in the midline overlying the L4 vertebral body, this may be the referenced duodenal clip. Multiple surgical clips in the right upper and lower quadrants were present on prior exam. L2-L3 fusion hardware with interbody spacers. No bowel dilatation to suggest obstruction. No significant formed stool in the colon. IMPRESSION: A clip is seen in the midline over the L4 vertebral body, this may be the referenced duodenal clip. Electronically Signed   By: MKeith RakeM.D.   On: 07/28/2022 19:06   DG CHEST PORT 1 VIEW  Result Date: 07/28/2022 CLINICAL DATA:  Evaluate for aspiration pneumonia, postoperative state EXAM: PORTABLE CHEST 1 VIEW COMPARISON:  12/05/2019 FINDINGS: Transverse diameter of heart is increased. Subtle increased markings are seen in the left lower lung field. There is a interval clearing of extensive patchy infiltrates in both lungs. There is no pleural effusion or  pneumothorax. There is a metallic structure in the left superior mediastinum with no significant change, possibly residual change from previous vascular intervention. There is surgical fusion in cervical spine. IMPRESSION: Small patchy focus of increased interstitial markings in left lower lung fields may suggest scarring or interstitial pneumonia. Electronically Signed  By: Elmer Picker M.D.   On: 07/28/2022 15:18   MM 3D SCREEN BREAST BILATERAL  Result Date: 07/15/2022 CLINICAL DATA:  Screening. EXAM: DIGITAL SCREENING BILATERAL MAMMOGRAM WITH TOMOSYNTHESIS AND CAD TECHNIQUE: Bilateral screening digital craniocaudal and mediolateral oblique mammograms were obtained. Bilateral screening digital breast tomosynthesis was performed. The images were evaluated with computer-aided detection. COMPARISON:  Previous exam(s). ACR Breast Density Category b: There are scattered areas of fibroglandular density. FINDINGS: There are no findings suspicious for malignancy. IMPRESSION: No mammographic evidence of malignancy. A result letter of this screening mammogram will be mailed directly to the patient. RECOMMENDATION: Screening mammogram in one year. (Code:SM-B-01Y) BI-RADS CATEGORY  1: Negative. Electronically Signed   By: Valentino Saxon M.D.   On: 07/15/2022 08:22    Microbiology: Recent Results (from the past 240 hour(s))  MRSA Next Gen by PCR, Nasal     Status: None   Collection Time: 08/07/22 11:10 PM   Specimen: Nasal Mucosa; Nasal Swab  Result Value Ref Range Status   MRSA by PCR Next Gen NOT DETECTED NOT DETECTED Final    Comment: (NOTE) The GeneXpert MRSA Assay (FDA approved for NASAL specimens only), is one component of a comprehensive MRSA colonization surveillance program. It is not intended to diagnose MRSA infection nor to guide or monitor treatment for MRSA infections. Test performance is not FDA approved in patients less than 30 years old. Performed at [facility], [place] [address], [place], [place] [address]     Time spent: 50 minutes  Signed: Raiford Noble, DO [facility] 08-27-22

## 2022-09-07 NOTE — Progress Notes (Addendum)
                                           Overnight   NAME: [name] MRN: [id number] DOB : 1961/03/27    Date of Service   August 30, 2022   HPI/Events of Note   Notified for Hypotension. Patient expressed nausea to RN at the time of Hypotension. Very Large amount of Blood obvious in stool.    Per earlier recommendations/notation of Physician(s), pressor started in the immediate to bridge to Blood transfusion (New Type and screen required/ordered STAT) Labs sent. Called for Large volume transfusion.  CCM called due to recently known patient. GI called (followed by) Spouse called.   62 year old female with a past medical history significant for paroxysmal atrial fibrillation not on anticoagulation, history of iron deficiency anemia, osteoarthritis, GERD, GI bleed and duodenal AVM.  Presented with multiple episodes of hematemesis and rectal bleed admitted for GI bleed. 08/28/22-early morning hematemesis and rectal bleeding systolic blood pressure in the 70s, pale. EGD revealed oozing cratered and superficial duodenal ulcer with oozing hemorrhage, hemostasis was achieved with 3 hemostatic clips.  Notified tonight by nurse for rectal bleeding sudden drop of blood pressure, nausea, tachycardia. Bedside visit and Interventions as listed below ordered/completed. Immediate CCM re-consult by phone/ en-route to this facility.  Soon after arrival of CCM Physician Patient suffered cardiac arrest ( see CODE sheet for specifics) Patient intubated. 2- units of blood infused(infusing).    Interventions/ Plan   STAT PRBCs ordered.  Labs drawn -sent Fluid bolus ordered. Vasopressin ordered. Levophed ordered. CCM re-consult.       Gershon Cull BSN MSNA MSN ACNPC-AG Acute Care Nurse Practitioner [place]

## 2022-09-07 NOTE — Progress Notes (Signed)
Cross covering ICU physician.   Called to [facility] for acute drop in BP and massive bloody bm. Verbally added levo/vaso and asked for emergency release uncrossed blood to start transfusion. Hgb had gone from 10->7 prior to the large bloody bm and pt had no up to date type and screen per blood bank.   She was not receving blood on my arrival and so I called for MTP and epi infusion. Pt was becoming less response and was intubated without medications and without difficulty.  Pt brady'd down and asystole and cpr was initiated.   See code documentation for complete details. Code was called at [phone], family at bedside now.

## 2022-09-07 NOTE — Progress Notes (Addendum)
Patient called this RN and stated "I feel like I'm about to pass out and proceeds to have an incontinence episode of very large obviously bloody stool and an emesis episode of bright red blood. Gershon Cull, NP made aware and at bedside. Compazine and levophed ordered. See MAR.

## 2022-09-07 NOTE — Progress Notes (Signed)
[place] Progress Note Patient Name: CZARINA GINGRAS DOB: 11/06/1960 MRN: [id number]   Date of Service  August 13, 2022  HPI/Events of Note  Camera: ACLS coding going on, bed side team of doc handling it. GI bleeding, active, getting PRBC statx 2 via pressure bag, hand pressure.   GI, Surgery notes reviewed.   eICU Interventions  Continue care per bed side team, MD.  6:55: no ROSC after prolonged code, multiple PRBC/ACLS protocol.       Intervention Category Major Interventions: Code management / supervision  Elmer Sow 08/13/2022, 6:34 AM

## 2022-09-07 DEATH — deceased

## 2022-11-06 ENCOUNTER — Other Ambulatory Visit: Payer: PRIVATE HEALTH INSURANCE

## 2022-11-13 ENCOUNTER — Telehealth: Payer: PRIVATE HEALTH INSURANCE | Admitting: Physician Assistant
